# Patient Record
Sex: Male | Born: 1975 | Hispanic: No | Marital: Married | Smoking: Former smoker
Health system: Southern US, Community
[De-identification: ages and names within clinical notes are randomized; demographics above are authoritative.]

## PROBLEM LIST (undated history)

## (undated) DIAGNOSIS — Z87442 Personal history of urinary calculi: Secondary | ICD-10-CM

## (undated) DIAGNOSIS — N200 Calculus of kidney: Secondary | ICD-10-CM

## (undated) DIAGNOSIS — E785 Hyperlipidemia, unspecified: Secondary | ICD-10-CM

## (undated) DIAGNOSIS — M6282 Rhabdomyolysis: Secondary | ICD-10-CM

## (undated) DIAGNOSIS — F329 Major depressive disorder, single episode, unspecified: Secondary | ICD-10-CM

## (undated) DIAGNOSIS — G43909 Migraine, unspecified, not intractable, without status migrainosus: Secondary | ICD-10-CM

## (undated) DIAGNOSIS — K8020 Calculus of gallbladder without cholecystitis without obstruction: Secondary | ICD-10-CM

## (undated) DIAGNOSIS — N259 Disorder resulting from impaired renal tubular function, unspecified: Secondary | ICD-10-CM

## (undated) DIAGNOSIS — G4700 Insomnia, unspecified: Secondary | ICD-10-CM

## (undated) DIAGNOSIS — R7302 Impaired glucose tolerance (oral): Secondary | ICD-10-CM

## (undated) DIAGNOSIS — R7989 Other specified abnormal findings of blood chemistry: Secondary | ICD-10-CM

## (undated) DIAGNOSIS — I219 Acute myocardial infarction, unspecified: Secondary | ICD-10-CM

## (undated) DIAGNOSIS — J9690 Respiratory failure, unspecified, unspecified whether with hypoxia or hypercapnia: Secondary | ICD-10-CM

## (undated) DIAGNOSIS — I472 Ventricular tachycardia, unspecified: Secondary | ICD-10-CM

## (undated) DIAGNOSIS — I10 Essential (primary) hypertension: Secondary | ICD-10-CM

## (undated) DIAGNOSIS — F419 Anxiety disorder, unspecified: Secondary | ICD-10-CM

## (undated) DIAGNOSIS — Z8739 Personal history of other diseases of the musculoskeletal system and connective tissue: Secondary | ICD-10-CM

## (undated) DIAGNOSIS — I5022 Chronic systolic (congestive) heart failure: Secondary | ICD-10-CM

## (undated) DIAGNOSIS — Z95810 Presence of automatic (implantable) cardiac defibrillator: Secondary | ICD-10-CM

## (undated) DIAGNOSIS — I255 Ischemic cardiomyopathy: Secondary | ICD-10-CM

## (undated) DIAGNOSIS — I2510 Atherosclerotic heart disease of native coronary artery without angina pectoris: Secondary | ICD-10-CM

## (undated) DIAGNOSIS — I509 Heart failure, unspecified: Secondary | ICD-10-CM

## (undated) DIAGNOSIS — J309 Allergic rhinitis, unspecified: Secondary | ICD-10-CM

## (undated) HISTORY — DX: Hyperlipidemia, unspecified: E78.5

## (undated) HISTORY — DX: Other specified abnormal findings of blood chemistry: R79.89

## (undated) HISTORY — DX: Rhabdomyolysis: M62.82

## (undated) HISTORY — DX: Calculus of gallbladder without cholecystitis without obstruction: K80.20

## (undated) HISTORY — DX: Presence of automatic (implantable) cardiac defibrillator: Z95.810

## (undated) HISTORY — DX: Chronic systolic (congestive) heart failure: I50.22

## (undated) HISTORY — DX: Ventricular tachycardia, unspecified: I47.20

## (undated) HISTORY — DX: Impaired glucose tolerance (oral): R73.02

## (undated) HISTORY — DX: Atherosclerotic heart disease of native coronary artery without angina pectoris: I25.10

## (undated) HISTORY — DX: Acute myocardial infarction, unspecified: I21.9

## (undated) HISTORY — DX: Allergic rhinitis, unspecified: J30.9

## (undated) HISTORY — DX: Disorder resulting from impaired renal tubular function, unspecified: N25.9

## (undated) HISTORY — DX: Anxiety disorder, unspecified: F41.9

## (undated) HISTORY — DX: Respiratory failure, unspecified, unspecified whether with hypoxia or hypercapnia: J96.90

## (undated) HISTORY — DX: Migraine, unspecified, not intractable, without status migrainosus: G43.909

## (undated) HISTORY — DX: Insomnia, unspecified: G47.00

## (undated) HISTORY — DX: Calculus of kidney: N20.0

## (undated) HISTORY — DX: Major depressive disorder, single episode, unspecified: F32.9

## (undated) HISTORY — DX: Ventricular tachycardia: I47.2

---

## 1997-11-04 ENCOUNTER — Emergency Department (HOSPITAL_COMMUNITY): Admission: EM | Admit: 1997-11-04 | Discharge: 1997-11-04 | Payer: Self-pay | Admitting: Emergency Medicine

## 2005-03-04 ENCOUNTER — Ambulatory Visit (HOSPITAL_COMMUNITY)
Admission: RE | Admit: 2005-03-04 | Discharge: 2005-03-04 | Payer: Self-pay | Admitting: Physical Medicine and Rehabilitation

## 2006-09-19 ENCOUNTER — Ambulatory Visit: Payer: Self-pay | Admitting: Internal Medicine

## 2006-09-19 LAB — CONVERTED CEMR LAB
AST: 25 units/L (ref 0–37)
Albumin: 4.1 g/dL (ref 3.5–5.2)
Alkaline Phosphatase: 79 units/L (ref 39–117)
BUN: 14 mg/dL (ref 6–23)
Basophils Absolute: 0 10*3/uL (ref 0.0–0.1)
Bilirubin Urine: NEGATIVE
Chloride: 104 meq/L (ref 96–112)
Cholesterol: 224 mg/dL (ref 0–200)
Direct LDL: 153.2 mg/dL
Eosinophils Absolute: 0.1 10*3/uL (ref 0.0–0.6)
GFR calc non Af Amer: 121 mL/min
HCT: 44 % (ref 39.0–52.0)
HDL: 32.9 mg/dL — ABNORMAL LOW (ref >39.0)
Hemoglobin, Urine: NEGATIVE
Ketones, ur: NEGATIVE mg/dL
MCHC: 33.9 g/dL (ref 30.0–36.0)
MCV: 79.3 fL (ref 78.0–100.0)
Monocytes Relative: 8.7 % (ref 3.0–11.0)
Neutrophils Relative %: 63 % (ref 43.0–77.0)
Potassium: 4.2 meq/L (ref 3.5–5.1)
RBC: 5.55 M/uL (ref 4.22–5.81)
Sodium: 139 meq/L (ref 135–145)
Testosterone: 292.57 ng/dL — ABNORMAL LOW (ref 350.00–890)
Total CHOL/HDL Ratio: 6.8
Triglycerides: 244 mg/dL (ref 0–149)
Urine Glucose: NEGATIVE mg/dL
Urobilinogen, UA: 0.2 (ref 0.0–1.0)
VLDL: 49 mg/dL — ABNORMAL HIGH (ref 0–40)
pH: 6 (ref 5.0–8.0)

## 2006-09-20 DIAGNOSIS — J45909 Unspecified asthma, uncomplicated: Secondary | ICD-10-CM | POA: Insufficient documentation

## 2006-09-20 DIAGNOSIS — Z87442 Personal history of urinary calculi: Secondary | ICD-10-CM

## 2007-03-23 ENCOUNTER — Encounter: Payer: Self-pay | Admitting: Internal Medicine

## 2007-06-22 ENCOUNTER — Ambulatory Visit: Payer: Self-pay | Admitting: Internal Medicine

## 2007-06-22 DIAGNOSIS — E785 Hyperlipidemia, unspecified: Secondary | ICD-10-CM | POA: Insufficient documentation

## 2007-06-22 DIAGNOSIS — G479 Sleep disorder, unspecified: Secondary | ICD-10-CM

## 2007-06-22 DIAGNOSIS — G4700 Insomnia, unspecified: Secondary | ICD-10-CM

## 2007-06-22 DIAGNOSIS — F32A Depression, unspecified: Secondary | ICD-10-CM | POA: Insufficient documentation

## 2007-06-22 DIAGNOSIS — K8020 Calculus of gallbladder without cholecystitis without obstruction: Secondary | ICD-10-CM | POA: Insufficient documentation

## 2007-06-22 DIAGNOSIS — F329 Major depressive disorder, single episode, unspecified: Secondary | ICD-10-CM | POA: Insufficient documentation

## 2007-06-22 DIAGNOSIS — F3289 Other specified depressive episodes: Secondary | ICD-10-CM

## 2007-06-22 HISTORY — DX: Major depressive disorder, single episode, unspecified: F32.9

## 2007-06-22 HISTORY — DX: Calculus of gallbladder without cholecystitis without obstruction: K80.20

## 2007-06-22 HISTORY — DX: Insomnia, unspecified: G47.00

## 2007-06-22 HISTORY — DX: Other specified depressive episodes: F32.89

## 2007-08-07 ENCOUNTER — Telehealth (INDEPENDENT_AMBULATORY_CARE_PROVIDER_SITE_OTHER): Payer: Self-pay | Admitting: *Deleted

## 2007-12-28 ENCOUNTER — Telehealth (INDEPENDENT_AMBULATORY_CARE_PROVIDER_SITE_OTHER): Payer: Self-pay | Admitting: *Deleted

## 2008-12-19 ENCOUNTER — Ambulatory Visit: Payer: Self-pay | Admitting: Internal Medicine

## 2008-12-19 DIAGNOSIS — R030 Elevated blood-pressure reading, without diagnosis of hypertension: Secondary | ICD-10-CM | POA: Insufficient documentation

## 2009-02-14 ENCOUNTER — Ambulatory Visit: Payer: Self-pay | Admitting: Internal Medicine

## 2009-02-14 DIAGNOSIS — M542 Cervicalgia: Secondary | ICD-10-CM | POA: Insufficient documentation

## 2009-02-14 DIAGNOSIS — J069 Acute upper respiratory infection, unspecified: Secondary | ICD-10-CM | POA: Insufficient documentation

## 2009-02-18 HISTORY — PX: CORONARY ANGIOPLASTY WITH STENT PLACEMENT: SHX49

## 2009-06-27 ENCOUNTER — Encounter: Payer: Self-pay | Admitting: Internal Medicine

## 2009-08-18 ENCOUNTER — Inpatient Hospital Stay (HOSPITAL_COMMUNITY): Admission: EM | Admit: 2009-08-18 | Discharge: 2009-08-29 | Payer: Self-pay | Admitting: Emergency Medicine

## 2009-08-18 ENCOUNTER — Encounter (INDEPENDENT_AMBULATORY_CARE_PROVIDER_SITE_OTHER): Payer: Self-pay | Admitting: Cardiology

## 2009-08-18 ENCOUNTER — Ambulatory Visit: Payer: Self-pay | Admitting: Pulmonary Disease

## 2009-08-18 DIAGNOSIS — J9690 Respiratory failure, unspecified, unspecified whether with hypoxia or hypercapnia: Secondary | ICD-10-CM

## 2009-08-18 DIAGNOSIS — I219 Acute myocardial infarction, unspecified: Secondary | ICD-10-CM

## 2009-08-18 HISTORY — DX: Respiratory failure, unspecified, unspecified whether with hypoxia or hypercapnia: J96.90

## 2009-08-18 HISTORY — DX: Acute myocardial infarction, unspecified: I21.9

## 2009-08-24 ENCOUNTER — Encounter: Payer: Self-pay | Admitting: Cardiovascular Disease

## 2009-08-28 ENCOUNTER — Ambulatory Visit: Payer: Self-pay | Admitting: Infectious Diseases

## 2009-09-04 ENCOUNTER — Encounter: Admission: RE | Admit: 2009-09-04 | Discharge: 2009-09-04 | Payer: Self-pay | Admitting: Cardiology

## 2009-09-13 ENCOUNTER — Ambulatory Visit: Payer: Self-pay | Admitting: Pulmonary Disease

## 2009-09-13 DIAGNOSIS — J159 Unspecified bacterial pneumonia: Secondary | ICD-10-CM | POA: Insufficient documentation

## 2009-09-18 ENCOUNTER — Ambulatory Visit: Payer: Self-pay | Admitting: Cardiovascular Disease

## 2009-09-18 DIAGNOSIS — M6282 Rhabdomyolysis: Secondary | ICD-10-CM

## 2009-09-18 DIAGNOSIS — I5022 Chronic systolic (congestive) heart failure: Secondary | ICD-10-CM

## 2009-09-18 DIAGNOSIS — G43909 Migraine, unspecified, not intractable, without status migrainosus: Secondary | ICD-10-CM

## 2009-09-18 DIAGNOSIS — N259 Disorder resulting from impaired renal tubular function, unspecified: Secondary | ICD-10-CM

## 2009-09-18 DIAGNOSIS — J9600 Acute respiratory failure, unspecified whether with hypoxia or hypercapnia: Secondary | ICD-10-CM

## 2009-09-18 DIAGNOSIS — E7800 Pure hypercholesterolemia, unspecified: Secondary | ICD-10-CM | POA: Insufficient documentation

## 2009-09-18 DIAGNOSIS — I219 Acute myocardial infarction, unspecified: Secondary | ICD-10-CM | POA: Insufficient documentation

## 2009-09-18 HISTORY — DX: Rhabdomyolysis: M62.82

## 2009-09-18 HISTORY — DX: Disorder resulting from impaired renal tubular function, unspecified: N25.9

## 2009-09-18 HISTORY — DX: Chronic systolic (congestive) heart failure: I50.22

## 2009-09-18 HISTORY — DX: Migraine, unspecified, not intractable, without status migrainosus: G43.909

## 2009-09-18 LAB — CONVERTED CEMR LAB
GFR calc non Af Amer: 91.14 mL/min (ref >60)
Potassium: 4.4 meq/L (ref 3.5–5.1)
Pro B Natriuretic peptide (BNP): 519.9 pg/mL — ABNORMAL HIGH (ref 0.0–100.0)
Sodium: 138 meq/L (ref 135–145)

## 2009-09-21 ENCOUNTER — Encounter (INDEPENDENT_AMBULATORY_CARE_PROVIDER_SITE_OTHER): Payer: Self-pay | Admitting: *Deleted

## 2009-10-11 ENCOUNTER — Encounter: Payer: Self-pay | Admitting: Cardiovascular Disease

## 2009-10-18 ENCOUNTER — Ambulatory Visit: Payer: Self-pay | Admitting: Cardiovascular Disease

## 2009-10-18 ENCOUNTER — Ambulatory Visit (HOSPITAL_COMMUNITY): Admission: RE | Admit: 2009-10-18 | Discharge: 2009-10-18 | Payer: Self-pay | Admitting: Cardiovascular Disease

## 2009-10-20 ENCOUNTER — Ambulatory Visit: Payer: Self-pay | Admitting: Internal Medicine

## 2009-10-20 DIAGNOSIS — I429 Cardiomyopathy, unspecified: Secondary | ICD-10-CM | POA: Insufficient documentation

## 2009-10-20 DIAGNOSIS — I2589 Other forms of chronic ischemic heart disease: Secondary | ICD-10-CM

## 2009-10-20 DIAGNOSIS — I255 Ischemic cardiomyopathy: Secondary | ICD-10-CM

## 2009-10-20 HISTORY — DX: Ischemic cardiomyopathy: I25.5

## 2009-10-26 ENCOUNTER — Telehealth (INDEPENDENT_AMBULATORY_CARE_PROVIDER_SITE_OTHER): Payer: Self-pay | Admitting: *Deleted

## 2009-10-26 ENCOUNTER — Encounter (INDEPENDENT_AMBULATORY_CARE_PROVIDER_SITE_OTHER): Payer: Self-pay | Admitting: *Deleted

## 2009-10-27 ENCOUNTER — Encounter: Payer: Self-pay | Admitting: Pulmonary Disease

## 2009-10-27 ENCOUNTER — Ambulatory Visit: Payer: Self-pay | Admitting: Cardiovascular Disease

## 2009-10-27 LAB — CONVERTED CEMR LAB
Basophils Relative: 0.6 % (ref 0.0–3.0)
Chloride: 103 meq/L (ref 96–112)
Eosinophils Relative: 2.2 % (ref 0.0–5.0)
HCT: 40.3 % (ref 39.0–52.0)
Hemoglobin: 13.5 g/dL (ref 13.0–17.0)
INR: 1.1 — ABNORMAL HIGH (ref 0.8–1.0)
Lymphs Abs: 2.1 10*3/uL (ref 0.7–4.0)
MCV: 80.8 fL (ref 78.0–100.0)
Monocytes Absolute: 0.8 10*3/uL (ref 0.1–1.0)
Potassium: 4.3 meq/L (ref 3.5–5.1)
RBC: 4.99 M/uL (ref 4.22–5.81)
WBC: 6.8 10*3/uL (ref 4.5–10.5)

## 2009-10-30 ENCOUNTER — Ambulatory Visit: Payer: Self-pay | Admitting: Pulmonary Disease

## 2009-10-31 ENCOUNTER — Ambulatory Visit: Payer: Self-pay | Admitting: Cardiovascular Disease

## 2009-10-31 ENCOUNTER — Inpatient Hospital Stay (HOSPITAL_BASED_OUTPATIENT_CLINIC_OR_DEPARTMENT_OTHER): Admission: RE | Admit: 2009-10-31 | Discharge: 2009-10-31 | Payer: Self-pay | Admitting: Cardiovascular Disease

## 2009-11-02 ENCOUNTER — Encounter: Payer: Self-pay | Admitting: Internal Medicine

## 2009-11-18 HISTORY — PX: ICD IMPLANT: EP1208

## 2009-11-22 ENCOUNTER — Telehealth (INDEPENDENT_AMBULATORY_CARE_PROVIDER_SITE_OTHER): Payer: Self-pay | Admitting: *Deleted

## 2009-11-27 ENCOUNTER — Ambulatory Visit: Payer: Self-pay | Admitting: Internal Medicine

## 2009-11-27 ENCOUNTER — Ambulatory Visit (HOSPITAL_COMMUNITY): Admission: RE | Admit: 2009-11-27 | Discharge: 2009-11-28 | Payer: Self-pay | Admitting: Internal Medicine

## 2009-11-28 ENCOUNTER — Encounter: Payer: Self-pay | Admitting: Internal Medicine

## 2009-11-29 ENCOUNTER — Ambulatory Visit (HOSPITAL_COMMUNITY)
Admission: RE | Admit: 2009-11-29 | Discharge: 2009-11-29 | Payer: Self-pay | Source: Home / Self Care | Admitting: Internal Medicine

## 2009-11-29 ENCOUNTER — Encounter: Payer: Self-pay | Admitting: Internal Medicine

## 2009-11-30 ENCOUNTER — Telehealth: Payer: Self-pay | Admitting: Internal Medicine

## 2009-12-06 ENCOUNTER — Ambulatory Visit: Payer: Self-pay

## 2009-12-06 ENCOUNTER — Encounter: Payer: Self-pay | Admitting: Internal Medicine

## 2009-12-08 ENCOUNTER — Encounter (INDEPENDENT_AMBULATORY_CARE_PROVIDER_SITE_OTHER): Payer: Self-pay | Admitting: *Deleted

## 2010-01-17 ENCOUNTER — Ambulatory Visit: Payer: Self-pay | Admitting: Cardiovascular Disease

## 2010-01-17 DIAGNOSIS — Z95810 Presence of automatic (implantable) cardiac defibrillator: Secondary | ICD-10-CM

## 2010-01-17 HISTORY — DX: Presence of automatic (implantable) cardiac defibrillator: Z95.810

## 2010-02-18 HISTORY — PX: CARDIAC CATHETERIZATION: SHX172

## 2010-03-06 ENCOUNTER — Encounter: Payer: Self-pay | Admitting: Internal Medicine

## 2010-03-06 ENCOUNTER — Ambulatory Visit
Admission: RE | Admit: 2010-03-06 | Discharge: 2010-03-06 | Payer: Self-pay | Source: Home / Self Care | Attending: Internal Medicine | Admitting: Internal Medicine

## 2010-03-20 NOTE — Letter (Signed)
Summary: Laqueta Carina MD  Laqueta Carina MD   Imported By: Lester Ledbetter 07/10/2009 07:58:19  _____________________________________________________________________  External Attachment:    Type:   Image     Comment:   External Document

## 2010-03-20 NOTE — Letter (Signed)
Summary: Cardiac Catheterization Instructions- [facility]  [facility], Main Office  [address]   [place], [place] [address]   Phone: [phone]  Fax: [phone]     10/26/2009 MRN: [id number]  Jason Hogan [address] [place], [place]  [address]  Dear [name],   You are scheduled for a Cardiac Catheterization on TUESDAY 10-31-09 with Dr. Eden Emms  Please arrive to the 1st floor of the Heart and Vascular Center at [facility] at    8:30 am     on the day of your procedure. Please do not arrive before 6:30 a.m. Call the Heart and Vascular Center at [phone] if you are unable to make your appointmnet. The Code to get into the parking garage under the building is 0020. Take the elevators to the 1st floor. You must have someone to drive you home. Someone must be with you for the first 24 hours after you arrive home. Please wear clothes that are easy to get on and off and wear slip-on shoes. Do not eat or drink after midnight except water with your medications that morning. Bring all your medications and current insurance cards with you.  ___ DO NOT take these medications before your procedure: ________________________________________________________________  ___ Make sure you take your aspirin.  ___ You may take ALL of your medications with water that morning. ________________________________________________________________________________________________________________________________  ___ DO NOT take ANY medications before your procedure.  ___ Pre-med instructions:  ________________________________________________________________________________________________________________________________  The usual length of stay after your procedure is 2 to 3 hours. This can vary.  If you have any questions, please call the office at the number listed above.   Deliah Goody, RN

## 2010-03-20 NOTE — Progress Notes (Signed)
  Phone Note Other Incoming   Caller: dr Charlton Haws Summary of Call: pt needing a left heart cath due to recurrent chest pain and decreased EF%. scheduled for 10-31-09 @ 9:30am. pt aware, will have labs and cxr 10-27-09 Deliah Goody, RN  October 26, 2009 11:58 AM

## 2010-03-20 NOTE — Assessment & Plan Note (Signed)
Summary: add on @ 12:15 per dr Eden Emms spoke with dr Graciela Husbands on 10-19-09   Referring Provider:  Dr. Eden Emms Primary Provider:  Corwin Levins MD  CC:  add-on per Dr. Eden Emms.   Pt states that diuretic isnt draining as much fluid off he has noticed.  He has been lethargic about the last week or so.  He has noticed low BP as well.Jason Hogan  History of Present Illness: Jason Hogan is a 35 year old gentleman  recently hospitalized from 7/1-7/12 for acute ST elevation anterior MI Rx by Dr Sharyn Lull.  Previous silent MI with distal RCA occlusion that was collaterilized.  DES to proximal LAD with EF 50-55%.  Post MI course complicated by pulmonary edema, ? aspiration requiring 4 day intubation with cultures growing staph aureas.    . Following discharge he has had improvement in his shortness of breath but underwent  MRI yesterday demonstrating ejection fraction 35% with a large anteroapical and septal scar area and there is also significant hypokinesis.   the patient has some fatigue and mild exercise intolerance.  Current Medications (verified): 1)  Nasonex 50 Mcg/act  Susp (Mometasone Furoate) .... 2 Spray/side Once Daily As Needed 2)  Symbicort 80-4.5 Mcg/act  Aero (Budesonide-Formoterol Fumarate) .... 2puffs Two Times A Day As Needed 3)  Lasix 40 Mg Tabs (Furosemide) .... Take One Tablet Daily Up To 2 Tablets As Needed 4)  Crestor 10 Mg Tabs (Rosuvastatin Calcium) .... Take 1 Tab By Mouth At Bedtime 5)  Aspirin 325 Mg  Tabs (Aspirin) .... Take 1 Tablet By Mouth Every Morning 6)  Effient 10 Mg Tabs (Prasugrel Hcl) .... Take 1 Tablet By Mouth Once A Day 7)  Ramipril 2.5 Mg Caps (Ramipril) .... Take 1 Tablet By Mouth Once A Day 8)  Metoprolol Succinate 100 Mg Xr24h-Tab (Metoprolol Succinate) .... Take 1 Tablet By Mouth Once A Day 9)  Klor-Con M20 20 Meq Cr-Tabs (Potassium Chloride Crys Cr) .... Take One Tablet Once Daily Up To Twice As Needed  Allergies (verified): No Known Drug Allergies  Past History:  Past  Medical History: Last updated: 09/18/2009 MYOCARDIAL INFARCTION CAD  HYPERLIPIDEMIA  RENAL INSUFFICIENCY RHABDOMYOLYSIS HYPERCHOLESTEROLEMIA  UNSPECIFIED HEART FAILURE  RESPIRATORY FAILURE  MIGRAINE HEADACHE BACTERIAL PNEUMONIA NECK PAIN, RIGHT  URI  ELEVATED BLOOD PRESSURE WITHOUT DIAGNOSIS OF HYPERTENSION  INSOMNIA-SLEEP DISORDER-UNSPEC  DEPRESSION CHOLELITHIASIS RENAL CALCULUS, HX OF  ASTHMA   Acute anterolateral wall myocardial infarction.   Bilateral staph aureus pneumonia.   Status post acute respiratory failure secondary to congestive heart failure, secondary to aspiration.   Past Surgical History: Last updated: 09/20/2006 Denies surgical history  Family History: Last updated: 06/22/2007 heart disease DM hypothyroid  Social History: Last updated: 09/13/2009 work - [employer] Former Smoker Alcohol use-yes Married no children Patient states former smoker.   Review of Systems       full review of systems was negative apart from a history of present illness and past medical history.   Vital Signs:  Patient profile:   35 year old male Height:      73 inches Weight:      216 pounds BMI:     28.60 Pulse rate:   76 / minute Pulse rhythm:   regular BP sitting:   108 / 66  (left arm) Cuff size:   large  Vitals Entered By: Judithe Modest CMA (October 20, 2009 12:12 PM)  Physical Exam  General:  Well developed, well nourished young -Indian-Asian male appearing his stated age, in no  acute distress. Head:  normal HEENT Neck:  supple without thyromegaly carotids brisk and full no JVD Chest Wall:  without kyphosis or CVA tenderness Lungs:  clear to auscultation Heart:  without murmurs or gallops regular rate and rhythm nondisplaced PMI Abdomen:  soft nontender without hepatomegaly active bowel sounds Msk:  Back normal, normal gait. Muscle strength and tone normal. Pulses:  intact distal pulses Extremities:  no clubbing cyanosis or edema Neurologic:   alert and oriented Skin:  warm and dry Psych:  engaging affect   EKG  Procedure date:  10/20/2009  Findings:      sinus rhythm at 72 Intervals 0.15 5.11/0.42 Axis is 85  anterior MI with Q waves from V1 to V6  inferior MI  Impression & Recommendations:  Problem # 1:  CARDIOMYOPATHY, ISCHEMIC (ICD-414.8) the patient has a severe and persistent cardiomyopathy. MRI demonstrates severe scarring and ejection fraction is sufficiently low that ICD implantation is a reasonable consideration for the reduction of risk of sudden cardiac death. I have reviewed this extensively with the family. We have reviewed the potential benefits as well as the risks including but not limited to death perforation infection lead dislodgment and inappropriate ICD therapy. We discussed also the possibility of enrollment in the MADIT R. IT trial.   Like to proceed in October.  The other issue is what is the mechanism by which his left ventricular dysfunction worsened following revascularization. Affect is persistent enzyme elevation if they want whether there might have been subacute occlusion of his stenting site. Could there be another overlying mechanism of cardiac myopathy i.e. related to sepsis or possibly a rapid adverse remodeling. I will discuss these issues with Dr. Eden Emms The following medications were removed from the medication list:    Aldactone 25 Mg Tabs (Spironolactone) .Jason Hogan... Take 1 tablet by mouth once a day His updated medication list for this problem includes:    Lasix 40 Mg Tabs (Furosemide) .Jason Hogan... Take one tablet daily up to 2 tablets as needed    Aspirin 325 Mg Tabs (Aspirin) .Jason Hogan... Take 1 tablet by mouth every morning    Effient 10 Mg Tabs (Prasugrel hcl) .Jason Hogan... Take 1 tablet by mouth once a day    Ramipril 2.5 Mg Caps (Ramipril) .Jason Hogan... Take 1 tablet by mouth once a day    Metoprolol Succinate 100 Mg Xr24h-tab (Metoprolol succinate) .Jason Hogan... Take 1 tablet by mouth once a day  Problem # 2:   HYPERLIPIDEMIA (ICD-272.4) he'll be very important for him to work hard to get his cholesterol down to range. I wonder whether an endocrine referral at a major Medical Center might be a benefit His updated medication list for this problem includes:    Crestor 10 Mg Tabs (Rosuvastatin calcium) .Jason Hogan... Take 1 tab by mouth at bedtime

## 2010-03-20 NOTE — Miscellaneous (Signed)
Summary: [facility] Cardiac Progress Note   [facility] Cardiac Progress Note   Imported By: Roderic Ovens 10/30/2009 13:15:21  _____________________________________________________________________  External Attachment:    Type:   Image     Comment:   External Document

## 2010-03-20 NOTE — Letter (Signed)
Summary: Generic Letter  [facility]     [place], [place]    Phone:   Fax:         December 08, 2009 MRN: [id number]    Jason Hogan [address] [place], [place]  [address]    Dear Jason Hogan,   Dr. Eden Emms has schedule you to see Dr. Arminda Resides with [facility], on 01-30-10 at 3:00pm.  They are located at [address] [place], [place].  If you need to reschedule your appointment, please call [phone].   Sincerely,  Omar Person  This letter has been electronically signed by your physician.

## 2010-03-20 NOTE — Miscellaneous (Signed)
Summary: Orders Update  Clinical Lists Changes  Orders: Added new Test order of T-2 View CXR (71020TC) - Signed 

## 2010-03-20 NOTE — Letter (Signed)
Summary: Appointment - Cardiac MRI  [facility], Main Office  [address]   [place], [place] [address]   Phone: [phone]  Fax: [phone]      September 21, 2009 MRN: [id number]   DELMUS WARWICK [address] [place], [place]  [address]   Dear Mr. ROULSTON,   We have scheduled the above patient for an appointment for a Cardiac MRI on  10/18/2009 at  2:00p.m .  Please refer to the below information for the location and instructions for this test:  Location:     [facility]       [address]       [place], [place]  [address] Instructions:    Wilmon Arms at [facility] Outpatient Registration 45 minutes prior to your appointment time.  This will ensure you are in the Radiology Department 30 minutes prior to your appointment.    There are no restrictions for this test you may eat and take medications as usual.  If you need to reschedule this appointment please call at the number listed above.  Sincerely,     Lorne Skeens  [facility] Scheduling Team

## 2010-03-20 NOTE — Letter (Signed)
Summary: Implantable Device Instructions  [facility], Main Office  [address]   [place], [place] [address]   Phone: [phone]  Fax: [phone]      Implantable Device Instructions  You are scheduled for:  _____ Permanent Transvenous Pacemaker __X___ Implantable Cardioverter Defibrillator _____ Implantable Loop Recorder _____ Generator Change  on __10/10/11___ with Dr. KLEIN_____.  1.  Please arrive at the Short Stay Center at [facility] at __6:00 AM___ on the day of your procedure.  2.  Do not eat or drink the night before your procedure.  3.  Complete lab work on _10/3/11 ____.  The lab at [facility] is open from 8:30 AM to 1:30 PM and from 2:30 PM to 5:00 PM.  The lab at [facility] is open from 7:30 AM to 5:30 PM.  You do not have to be fasting.  4.  Do NOT take these medications for _HOLD FUROSEMIDE  AM OF PROCEDURE. 5.  Plan for an overnight stay.  Bring your insurance cards and a list of your medications.  6.  Wash your chest and neck with antibacterial soap (any brand) the evening before and the morning of your procedure.  Rinse well.  7.  Education material received:     Pacemaker _____           ICD __X___           Arrhythmia _____  *If you have ANY questions after you get home, please call the office [phone].  *Every attempt is made to prevent procedures from being rescheduled.  Due to the nauture of Electrophysiology, rescheduling can happen.  The physician is always aware and directs the staff when this occurs.

## 2010-03-20 NOTE — Assessment & Plan Note (Signed)
Summary: NP3/DM   Referring Provider:  Dr. Eden Emms Primary Provider:  Corwin Levins MD   History of Present Illness: 35 yo friend of mine recently hospitalized from 7/1-7/12 for acute ST elevation anterior MI Rx by Dr Sharyn Lull.  Previous silent MI with distal RCA occlusion that was collaterilized.  DES to proximal LAD with EF 50-55%.  Post MI course complicated by pulmonary edema, ? aspiration requiring 4 day intubation with cultures growing staph aureas.  Since D/C SOB slowly improving with less "hemoptysis". F/U with Dr Vassie Loll last week and no need for further antibiotics or steroids.  Did not tolerate aldactone and prefers Lasix.  No SSCP, improving dyspnea, no palpitations although HR still on high side.  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    Headaches have returned.  Indicated needing RL molar removal.  Told him he could not stop his Effient for at least 6 months.  Etiology of his headaches has never been clear.    Current Medications (verified): 1)  Nasonex 50 Mcg/act  Susp (Mometasone Furoate) .... 2 Spray/side Once Daily As Needed 2)  Symbicort 80-4.5 Mcg/act  Aero (Budesonide-Formoterol Fumarate) .... 2puffs Two Times A Day As Needed 3)  Lasix 40 Mg Tabs (Furosemide) .... Take 1 Tablet By Mouth Two Times A Day 4)  Aldactone 25 Mg Tabs (Spironolactone) .... Take 1 Tablet By Mouth Once A Day 5)  Crestor 10 Mg Tabs (Rosuvastatin Calcium) .... Take 1 Tab By Mouth At Bedtime 6)  Aspirin 325 Mg  Tabs (Aspirin) .... Take 1 Tablet By Mouth Every Morning 7)  Effient 10 Mg Tabs (Prasugrel Hcl) .... Take 1 Tablet By Mouth Once A Day 8)  Ramipril 2.5 Mg Caps (Ramipril) .... Take 1 Tablet By Mouth Once A Day 9)  Metoprolol Succinate 100 Mg Xr24h-Tab (Metoprolol Succinate) .... Take 1 Tablet By Mouth Once A Day 10)  Klor-Con M20 20 Meq Cr-Tabs (Potassium Chloride Crys Cr) .... Take 1 Tablet By Mouth Two Times A Day With Lasix 11)  Skelaxin 800 Mg Tabs  (Metaxalone) .... Take 1 Tablet By Mouth Three Times A Day As Needed  Allergies (verified): No Known Drug Allergies  Past History:  Past Medical History: Last updated: 09/18/2009 MYOCARDIAL INFARCTION CAD  HYPERLIPIDEMIA  RENAL INSUFFICIENCY RHABDOMYOLYSIS HYPERCHOLESTEROLEMIA  UNSPECIFIED HEART FAILURE  RESPIRATORY FAILURE  MIGRAINE HEADACHE BACTERIAL PNEUMONIA NECK PAIN, RIGHT  URI  ELEVATED BLOOD PRESSURE WITHOUT DIAGNOSIS OF HYPERTENSION  INSOMNIA-SLEEP DISORDER-UNSPEC  DEPRESSION CHOLELITHIASIS RENAL CALCULUS, HX OF  ASTHMA   Acute anterolateral wall myocardial infarction.   Bilateral staph aureus pneumonia.   Status post acute respiratory failure secondary to congestive heart failure, secondary to aspiration.   Past Surgical History: Last updated: 09/20/2006 Denies surgical history  Family History: Last updated: 06/22/2007 heart disease DM hypothyroid  Social History: Last updated: 09/13/2009 work - [employer] Former Smoker Alcohol use-yes Married no children Patient states former smoker.   Review of Systems       Denies fever, malais, weight loss, blurry vision, decreased visual acuity, cough, sputum, SOB, hemoptysis, pleuritic pain, palpitaitons, heartburn, abdominal pain, melena, lower extremity edema, claudication, or rash.   Vital Signs:  Patient profile:   35 year old male Pulse rate:   88 / minute Resp:     14 per minute BP supine:   105 / 65  Physical Exam  General:  Affect appropriate Healthy:  appears stated age HEENT: normal Neck supple with no adenopathy JVP normal no bruits  no thyromegaly Lungs clear with no wheezing and good diaphragmatic motion Heart:  S1/S2 no murmur,rub, gallop or click PMI normal Abdomen: benighn, BS positve, no tenderness, no AAA no bruit.  No HSM or HJR Distal pulses intact with no bruits No edema Neuro non-focal Skin warm and dry    Impression & Recommendations:  Problem # 1:  MYOCARDIAL  INFARCTION (ICD-410.90) Anterior MI 7/1 with DES to proximal LAD.  Silent distal RCA occlusion with collaterals.  F/U MRI 4-6 weeks to assess LVH/mass EF and scar tissue.   His updated medication list for this problem includes:    Aspirin 325 Mg Tabs (Aspirin) .[name]... Take 1 tablet by mouth every morning    Effient 10 Mg Tabs (Prasugrel hcl) .[name]... Take 1 tablet by mouth once a day    Ramipril 2.5 Mg Caps (Ramipril) .[name]... Take 1 tablet by mouth once a day    Metoprolol Succinate 100 Mg Xr24h-tab (Metoprolol succinate) .[name]... Take 1 tablet by mouth once a day  Orders: Cardiac MRI (Cardiac MRI)  Problem # 2:  HYPERCHOLESTEROLEMIA (ICD-272.0) F/U labs in 2-3 months on statin His updated medication list for this problem includes:    Crestor 10 Mg Tabs (Rosuvastatin calcium) .[name]... Take 1 tab by mouth at bedtime  CHOL: 224 (09/19/2006)   LDL: DEL (09/19/2006)   HDL: 32.9 (09/19/2006)   TG: 244 (09/19/2006)  Problem # 3:  RESPIRATORY FAILURE (ICD-518.81) Dyspnea improving.  Review of Xrays shows mild infiltrate persists in RLL only. ? post capillary fracture syndrome History of asthma with inhalers.  Imrproviing.  No roids or antibiotics  Check LV/RV funtion with MRI  Problem # 4:  RENAL INSUFFICIENCY (ICD-588.9) Will check BMET and decrease lasix to once a day if BNP under 100.  Stop aldactone Orders: TLB-BMP (Basic Metabolic Panel-BMET) (80048-METABOL)  Other Orders: TLB-BNP (B-Natriuretic Peptide) (83880-BNPR)  Patient Instructions: 1)  Your physician recommends that you schedule a follow-up appointment in: 3 MONTHS 2)  Your physician has requested that you have a cardiac MRI.  Cardiac MRI uses a computer to create images of your heart as it's beating, producing both still and moving pictures of your heart and major blood vessels. For further information please visit  [email or web address]  Please follow the instruction sheet given to you today for more information.

## 2010-03-20 NOTE — Miscellaneous (Signed)
Summary: Device preload  Clinical Lists Changes  Observations: Added new observation of ICD INDICATN: ICM (11/29/2009 13:21) Added new observation of ICDLEADSTAT1: active (11/29/2009 13:21) Added new observation of ICDLEADSER1: [id number] (11/29/2009 13:21) Added new observation of ICDLEADMOD1: 7121  (11/29/2009 13:21) Added new observation of ICDLEADLOC1: RV  (11/29/2009 13:21) Added new observation of ICD IMP MD: Sherryl Manges, MD  (11/29/2009 13:21) Added new observation of ICDLEADDOI1: 11/27/2009  (11/29/2009 13:21) Added new observation of ICD IMPL DTE: 11/27/2009  (11/29/2009 13:21) Added new observation of ICD SERL#: [id number]  (11/29/2009 13:21) Added new observation of ICD MODL#: [id number]  (11/29/2009 29:56) Added new observation of ICDMANUFACTR: [employer]  (11/29/2009 13:21) Added new observation of ICD MD: Sherryl Manges, MD  (11/29/2009 13:21)       ICD Specifications Following MD:  Sherryl Manges, MD     ICD Vendor:  [employer] Jude     ICD Model Number:  [id number]     ICD Serial Number:  [id number] ICD DOI:  11/27/2009     ICD Implanting MD:  Sherryl Manges, MD  Lead 1:    Location: RV     DOI: 11/27/2009     Model #: [id number]     Serial #: [id number]     Status: active  Indications::  ICM

## 2010-03-20 NOTE — Progress Notes (Signed)
----   Converted from flag ---- ---- 11/17/2009 12:07 PM, Colon Branch, MD, [facility] wrote: Jason Hogan is having some skin issues.  Not clear to me whats causing it.  Will D/C Toprol.  Call in coreg 12.5 two times a day for him.  See if you can get him an appt with Karlyn Agee in dermatology as well.  Thanks Nish. ------------------------------       Additional Follow-up for Phone Call Additional follow up Details #2::    see other phone note Deliah Goody, RN  November 22, 2009 8:17 AM\par

## 2010-03-20 NOTE — Cardiovascular Report (Signed)
Summary: Office Visit   Office Visit   Imported By: Roderic Ovens 12/18/2009 13:00:02  _____________________________________________________________________  External Attachment:    Type:   Image     Comment:   External Document

## 2010-03-20 NOTE — Cardiovascular Report (Signed)
Summary: Implantable Device Registration   Implantable Device Registration   Imported By: Roderic Ovens 12/18/2009 13:01:11  _____________________________________________________________________  External Attachment:    Type:   Image     Comment:   External Document

## 2010-03-20 NOTE — Assessment & Plan Note (Signed)
Summary: 10 days post hosp/apc   Visit Type:  Hospital Follow-up Copy to:  Dr. Eden Emms Primary Provider/Referring Provider:  Corwin Levins MD  CC:  Pt here for pulmonary consult.  History of Present Illness: 33/M adm 7/1-7/12 for acute MI requiring emergent LAD stent & resp failure requiring mechanical ventilation for MSSA pna ? aspiration. CXR 7/18 improved since 7/1 but rt infiltrates persist He had 1 episode of blood tinged sputum after dc, now on lasix & aldactone, echo showed EF 50-55% & WMA , but overall has continued to improve. he has started mild exercise again, cough has resolved, no fevers. He repors mild intermittent asthma as a child, requiring symbicort on one occasion('allergy season') but never had an exacerbation, his labuterol inhalers would always expire.  Preventive Screening-Counseling & Management  Alcohol-Tobacco     Alcohol drinks/day: <1     Smoking Status: quit     Packs/Day: <0.25     Year Started: 1998     Year Quit: 1998  Current Medications (verified): 1)  Nasonex 50 Mcg/act  Susp (Mometasone Furoate) .... 2 Spray/side Once Daily As Needed 2)  Symbicort 80-4.5 Mcg/act  Aero (Budesonide-Formoterol Fumarate) .... 2puffs Two Times A Day As Needed 3)  Lasix 40 Mg Tabs (Furosemide) .... Take 1 Tablet By Mouth Two Times A Day 4)  Aldactone 25 Mg Tabs (Spironolactone) .... Take 1 Tablet By Mouth Once A Day 5)  Crestor 10 Mg Tabs (Rosuvastatin Calcium) .... Take 1 Tab By Mouth At Bedtime 6)  Aspirin 325 Mg  Tabs (Aspirin) .... Take 1 Tablet By Mouth Every Morning 7)  Effient 10 Mg Tabs (Prasugrel Hcl) .... Take 1 Tablet By Mouth Once A Day 8)  Ramipril 2.5 Mg Caps (Ramipril) .... Take 1 Tablet By Mouth Once A Day 9)  Metoprolol Succinate 100 Mg Xr24h-Tab (Metoprolol Succinate) .... Take 1 Tablet By Mouth Once A Day 10)  Klor-Con M20 20 Meq Cr-Tabs (Potassium Chloride Crys Cr) .... Take 1 Tablet By Mouth Two Times A Day With Lasix 11)  Skelaxin 800 Mg Tabs  (Metaxalone) .... Take 1 Tablet By Mouth Three Times A Day As Needed  Allergies (verified): No Known Drug Allergies  Past History:  Past Medical History: Last updated: 06/22/2007 Asthma Kidney Stones Cholelithiasis Depression lumbar DDD Hyperlipidemia  Past Surgical History: Last updated: 09/20/2006 Denies surgical history  Social History: work - [employer] Former Smoker Alcohol use-yes Married no children Patient states former smoker.  Packs/Day:  <0.25 Alcohol drinks/day:  <1  Review of Systems       The patient complains of shortness of breath with activity, productive cough, coughing up blood, headaches, and joint stiffness or pain.  The patient denies shortness of breath at rest, non-productive cough, chest pain, irregular heartbeats, acid heartburn, indigestion, loss of appetite, weight change, abdominal pain, difficulty swallowing, sore throat, tooth/dental problems, nasal congestion/difficulty breathing through nose, sneezing, itching, ear ache, anxiety, depression, hand/feet swelling, rash, change in color of mucus, and fever.    Vital Signs:  Patient profile:   35 year old male Height:      73 inches Weight:      215 pounds BMI:     28.47 O2 Sat:      98 % on Room air Temp:     98.2 degrees F oral Pulse rate:   94 / minute BP sitting:   112 / 70  (right arm) Cuff size:   regular  Vitals Entered By: Zackery Barefoot CMA (September 13, 2009  2:43 PM)  O2 Flow:  Room air CC: Pt here for pulmonary consult  Does patient need assistance? Functional Status Social activities Comments Medications reviewed with patient Verified contact number and pharmacy with patient Zackery Barefoot CMA  September 13, 2009 2:43 PM    Physical Exam  Additional Exam:  Gen. Pleasant, well-nourished, in no distress, normal affect ENT - no lesions, no post nasal drip Neck: No JVD, no thyromegaly, no carotid bruits Lungs: no use of accessory muscles, no dullness to percussion, clear without  rales or rhonchi  Cardiovascular: Rhythm regular, heart sounds  normal, no murmurs or gallops, no peripheral edema Abdomen: soft and non-tender, no hepatosplenomegaly, BS normal. Musculoskeletal: No deformities, no cyanosis or clubbing Neuro:  alert, non focal     CXR  Procedure date:  09/13/2009  Findings:      IMPRESSION: Stable asymmetric residual streaky airspace opacities.   No new consolidation or effusion  Impression & Recommendations:  Problem # 1:  BACTERIAL PNEUMONIA (ICD-482.9) MSSA - Slow to resolve radiologically, but clinically much improved. Do not feel the need for more ABx. No indication for steroids. Rpt CXR in 1 month. His intermittent symptoms of dyspnea seem to have improved with lasix The following medications were removed from the medication list:    Cephalexin 500 Mg Caps (Cephalexin) .[name]... 1 by mouth three times a day  Orders: T-2 View CXR (71020TC) Est. Patient Level III (14782)  Problem # 2:  ASTHMA (ICD-493.90) Appears mild intermittent he takes symbicort only during allergy season.  Medications Added to Medication List This Visit: 1)  Nasonex 50 Mcg/act Susp (Mometasone furoate) .... 2 spray/side once daily as needed 2)  Symbicort 80-4.5 Mcg/act Aero (Budesonide-formoterol fumarate) .... 2puffs two times a day as needed 3)  Lasix 40 Mg Tabs (Furosemide) .... Take 1 tablet by mouth two times a day 4)  Lasix  .... Take 1 tablet by mouth two times a day 5)  Aldactone 25 Mg Tabs (Spironolactone) .... Take 1 tablet by mouth once a day 6)  Aldactone  .... Take 1 tablet by mouth once a day 7)  Crestor 10 Mg Tabs (Rosuvastatin calcium) .... Take 1 tab by mouth at bedtime 8)  Crestor  .... Take 1 tab by mouth at bedtime 9)  Aspirin 325 Mg Tabs (Aspirin) .... Take 1 tablet by mouth every morning 10)  Effient 10 Mg Tabs (Prasugrel hcl) .... Take 1 tablet by mouth once a day 11)  Ramipril 2.5 Mg Caps (Ramipril) .... Take 1 tablet by mouth once a day 12)   Metoprolol Succinate 100 Mg Xr24h-tab (Metoprolol succinate) .... Take 1 tablet by mouth once a day 13)  Klor-con M20 20 Meq Cr-tabs (Potassium chloride crys cr) .... Take 1 tablet by mouth two times a day with lasix 14)  Skelaxin 800 Mg Tabs (Metaxalone) .... Take 1 tablet by mouth three times a day as needed  Patient Instructions: 1)  Copy sent to: dr Eden Emms 2)  Please schedule a follow-up appointment in 1 month with chest xray 3)  Your pneumonia is improving

## 2010-03-20 NOTE — Letter (Signed)
Summary: Physician Documentation Sheet  Physician Documentation Sheet   Imported By: Debby Freiberg 09/18/2009 10:26:25  _____________________________________________________________________  External Attachment:    Type:   Image     Comment:   External Document

## 2010-03-20 NOTE — Cardiovascular Report (Signed)
Summary: Pre-Op Orders  Pre-Op Orders   Imported By: Marylou Mccoy 11/17/2009 16:09:36  _____________________________________________________________________  External Attachment:    Type:   Image     Comment:   External Document

## 2010-03-20 NOTE — Letter (Signed)
Summary: [facility]   [place] [facility]   Imported By: Roderic Ovens 12/05/2009 14:41:26  _____________________________________________________________________  External Attachment:    Type:   Image     Comment:   External Document

## 2010-03-20 NOTE — Progress Notes (Signed)
----   Converted from flag ---- ---- 11/17/2009 12:18 PM, Colon Branch, MD, [facility] wrote: I called in coreg 12.5 two times a day and Cozaar 50mg  to [facility] to take the place of Toprol and ramapril.  Please make chages in meds ------------------------------       New/Updated Medications: COZAAR 50 MG TABS (LOSARTAN POTASSIUM) Take one tablet by mouth daily CARVEDILOL 12.5 MG TABS (CARVEDILOL) Take one tablet by mouth twice a day

## 2010-03-20 NOTE — Assessment & Plan Note (Signed)
Summary: 3 MONTH ROV.SL   Referring Provider:  Dr. Eden Emms Primary Provider:  Corwin Levins MD  CC:  dizziness.  History of Present Illness: Jason Hogan is seen todya for F/U of CAD with large Anterior M.I. and collateralized silent distal RCA occlusion. Recent AICD implant with resolving hematoma.  Mild postural symptoms.  Compliant with meds.  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  No SSCP, palpitatons, AICD D/C or edema.  Hair and scalp improved on coreg and no rash with cozaar.  Did not tolerate aldactone post m.i. with excessive fatiigue.    Current Problems (verified): 1)  Pre-operative Cardiovascular Examination  (ICD-V72.81) 2)  Chest Pain Unspecified  (ICD-786.50) 3)  Myocardial Infarction  (ICD-410.90) 4)  Cardiomyopathy, Ischemic  (ICD-414.8) 5)  Hyperlipidemia  (ICD-272.4) 6)  Renal Insufficiency  (ICD-588.9) 7)  Rhabdomyolysis  (ICD-728.88) 8)  Hypercholesterolemia  (ICD-272.0) 9)  Unspecified Heart Failure  (ICD-428.9) 10)  Respiratory Failure  (ICD-518.81) 11)  Migraine Headache  (ICD-346.90) 12)  Bacterial Pneumonia  (ICD-482.9) 13)  Neck Pain, Right  (ICD-723.1) 14)  Uri  (ICD-465.9) 15)  Elevated Blood Pressure Without Diagnosis of Hypertension  (ICD-796.2) 16)  Insomnia-sleep Disorder-unspec  (ICD-780.52) 17)  Depression  (ICD-311) 18)  Cholelithiasis  (ICD-574.20) 19)  Renal Calculus, Hx of  (ICD-V13.01) 20)  Asthma  (ICD-493.90)  Current Medications (verified): 1)  Nasonex 50 Mcg/act  Susp (Mometasone Furoate) .... 2 Spray/side Once Daily As Needed 2)  Symbicort 80-4.5 Mcg/act  Aero (Budesonide-Formoterol Fumarate) .... 2puffs Two Times A Day As Needed 3)  Lasix 40 Mg Tabs (Furosemide) .... Take One Tablet Daily Up To 2 Tablets As Needed 4)  Crestor 10 Mg Tabs (Rosuvastatin Calcium) .... Take 1 Tab By Mouth At Bedtime 5)  Aspirin 81 Mg Tbec (Aspirin) .... Take One Tablet By Mouth Daily 6)  Effient 10 Mg Tabs (Prasugrel Hcl) .... Take 1 Tablet By  Mouth Once A Day 7)  Cozaar 50 Mg Tabs (Losartan Potassium) .... Take One Tablet By Mouth Daily 8)  Carvedilol 12.5 Mg Tabs (Carvedilol) .... Take One Tablet By Mouth Twice A Day 9)  Klor-Con M20 20 Meq Cr-Tabs (Potassium Chloride Crys Cr) .... Take One Tablet Once Daily Up To Twice As Needed  Allergies (verified): No Known Drug Allergies  Past History:  Past Medical History: Last updated: 09/18/2009 MYOCARDIAL INFARCTION CAD  HYPERLIPIDEMIA  RENAL INSUFFICIENCY RHABDOMYOLYSIS HYPERCHOLESTEROLEMIA  UNSPECIFIED HEART FAILURE  RESPIRATORY FAILURE  MIGRAINE HEADACHE BACTERIAL PNEUMONIA NECK PAIN, RIGHT  URI  ELEVATED BLOOD PRESSURE WITHOUT DIAGNOSIS OF HYPERTENSION  INSOMNIA-SLEEP DISORDER-UNSPEC  DEPRESSION CHOLELITHIASIS RENAL CALCULUS, HX OF  ASTHMA   Acute anterolateral wall myocardial infarction.   Bilateral staph aureus pneumonia.   Status post acute respiratory failure secondary to congestive heart failure, secondary to aspiration.   Past Surgical History: Last updated: 09/20/2006 Denies surgical history  Family History: Last updated: 06/22/2007 heart disease DM hypothyroid  Social History: Last updated: 09/13/2009 work - [employer] Former Smoker Alcohol use-yes Married no children Patient states former smoker.   Review of Systems       Denies fever, malais, weight loss, blurry vision, decreased visual acuity, cough, sputum, SOB, hemoptysis, pleuritic pain, palpitaitons, heartburn, abdominal pain, melena, lower extremity edema, claudication, or rash.   Vital Signs:  Patient profile:   35 year old male Height:      73 inches Weight:      231 pounds BMI:     30.59 Pulse rate:   68 / minute Resp:  14 per minute BP sitting:   113 / 76  (right arm)  Vitals Entered By: Kem Parkinson (January 17, 2010 9:43 AM)  Physical Exam  General:  Affect appropriate Healthy:  appears stated age HEENT: normal Neck supple with no adenopathy JVP normal no  bruits no thyromegaly Lungs clear with no wheezing and good diaphragmatic motion Heart:  S1/S2 no murmur,rub, gallop or click PMI normal Abdomen: benighn, BS positve, no tenderness, no AAA no bruit.  No HSM or HJR Distal pulses intact with no bruits No edema Neuro non-focal Skin warm and dry AICD under left calvicle with resolved hematoma    ICD Specifications Following MD:  Sherryl Manges, MD     ICD Vendor:  [employer] Jude     ICD Model Number:  [id number]     ICD Serial Number:  [id number] ICD DOI:  11/27/2009     ICD Implanting MD:  Sherryl Manges, MD  Lead 1:    Location: RV     DOI: 11/27/2009     Model #: [id number]     Serial #: [id number]     Status: active  Indications::  ICM   ICD Follow Up ICD Dependent:  No      Episodes Coumadin:  No  Brady Parameters Mode VVI     Lower Rate Limit:  40      Tachy Zones VF:  206     Impression & Recommendations:  Problem # 1:  MYOCARDIAL INFARCTION (ICD-410.90) Ischemic DCM  Euvolemic.  Watch postural symptoms but will not cut back meds at this time.  BMET and BNP next visti His updated medication list for this problem includes:    Aspirin 81 Mg Tbec (Aspirin) .[name]... Take one tablet by mouth daily    Effient 10 Mg Tabs (Prasugrel hcl) .[name]... Take 1 tablet by mouth once a day    Carvedilol 12.5 Mg Tabs (Carvedilol) .[name]... Take one tablet by mouth twice a day  Problem # 2:  HYPERLIPIDEMIA (ICD-272.4) Labs in 3 months  Continue statin His updated medication list for this problem includes:    Crestor 10 Mg Tabs (Rosuvastatin calcium) .[name]... Take 1 tab by mouth at bedtime  CHOL: 224 (09/19/2006)   LDL: DEL (09/19/2006)   HDL: 32.9 (09/19/2006)   TG: 244 (09/19/2006)  Problem # 3:  AUTOMATIC IMPLANTABLE CARDIAC DEFIBRILLATOR SITU (ICD-V45.02) Site is much improved with no signs of bleeding or infection.  F/U clinic. No D/C  Patient Instructions: 1)  Your physician recommends that you schedule a follow-up appointment in: 3 MONTHS WITH DR Eden Emms 2)  Your  physician recommends that you continue on your current medications as directed. Please refer to the Current Medication list given to you today.

## 2010-03-20 NOTE — Procedures (Signed)
Summary: wound check.[email or web address]   Current Medications (verified): 1)  Nasonex 50 Mcg/act  Susp (Mometasone Furoate) .... 2 Spray/side Once Daily As Needed 2)  Symbicort 80-4.5 Mcg/act  Aero (Budesonide-Formoterol Fumarate) .... 2puffs Two Times A Day As Needed 3)  Lasix 40 Mg Tabs (Furosemide) .... Take One Tablet Daily Up To 2 Tablets As Needed 4)  Crestor 10 Mg Tabs (Rosuvastatin Calcium) .... Take 1 Tab By Mouth At Bedtime 5)  Aspirin 325 Mg  Tabs (Aspirin) .... Take 1 Tablet By Mouth Every Morning 6)  Effient 10 Mg Tabs (Prasugrel Hcl) .... Take 1 Tablet By Mouth Once A Day 7)  Cozaar 50 Mg Tabs (Losartan Potassium) .... Take One Tablet By Mouth Daily 8)  Carvedilol 12.5 Mg Tabs (Carvedilol) .... Take One Tablet By Mouth Twice A Day 9)  Klor-Con M20 20 Meq Cr-Tabs (Potassium Chloride Crys Cr) .... Take One Tablet Once Daily Up To Twice As Needed  Allergies (verified): No Known Drug Allergies   ICD Specifications Following MD:  Sherryl Manges, MD     ICD Vendor:  [employer] Jude     ICD Model Number:  [id number]     ICD Serial Number:  [id number] ICD DOI:  11/27/2009     ICD Implanting MD:  Sherryl Manges, MD  Lead 1:    Location: RV     DOI: 11/27/2009     Model #: [id number]     Serial #: [id number]     Status: active  Indications::  ICM   ICD Follow Up Remote Check?  No Charge Time:  8.0 seconds     Battery Est. Longevity:  8.1 years Underlying rhythm:  SR ICD Dependent:  No       ICD Device Measurements Right Ventricle:  Amplitude: 11.9 mV, Impedance: 560 ohms, Threshold: 0.5 V at 0.5 msec Shock Impedance: 33 ohms   Episodes Coumadin:  No Shock:  0     ATP:  0     Nonsustained:  0     Ventricular Pacing:  0%  Brady Parameters Mode VVI     Lower Rate Limit:  40      Tachy Zones VF:  206     Next Cardiology Appt Due:  03/06/2010 Tech Comments:  Steri strips removed, no redness or edema noted.  Device function normal.  No parameter changes.  ROV 03/06/10 with Dr. Graciela Husbands. Altha Harm, LPN  December 06, 2009 12:54 PM

## 2010-03-20 NOTE — Progress Notes (Signed)
Summary: talk to nurse  Phone Note Call from Patient Call back at Home Phone [phone]   Caller: Patient Reason for Call: Talk to Nurse Summary of Call: pt talked to Dr Graciela Husbands in hospital and was told to come into the ofc tomorrow at 7:45. Dont see anything where he is to come in.  Initial call taken by: Edman Circle,  November 30, 2009 12:25 PM  Follow-up for Phone Call        pt adv that he had compression bandage placed by Dr. Graciela Husbands yesterday and was adv to come in to either office or hospital tomorrow at 745 and to see someone to have changed again. left msg for Dr. Graciela Husbands for clarification and then will adv pt.  Follow-up by: Claris Gladden RN,  November 30, 2009 1:06 PM  Additional Follow-up for Phone Call Additional follow up Details #1::        per Dr. Graciela Husbands pt to go to [facility] and see Dr. Ladona Ridgel. Adv pt of info. Additional Follow-up by: Claris Gladden RN,  November 30, 2009 1:27 PM

## 2010-03-20 NOTE — Assessment & Plan Note (Signed)
Summary: 1 month/ mbw   Visit Type:  Follow-up Copy to:  Dr. Eden Emms Primary Provider/Referring Provider:  Corwin Levins MD  CC:  Pt here for follow up. States no new complaints or concerns. Pt states will be traveling to [place] in November and wants to know if he will need flu shot.  History of Present Illness: 33/M adm 7/1-7/12 for acute MI requiring emergent LAD stent & resp failure requiring mechanical ventilation for MSSA pna ? aspiration. CXR 7/18 improved since 7/1 but rt infiltrates persist He had 1 episode of blood tinged sputum after dc, now on lasix & aldactone, echo showed EF 50-55% & WMA , but overall has continued to improve. he has started mild exercise again, cough has resolved, no fevers. He repors mild intermittent asthma as a child, requiring symbicort on one occasion('allergy season') but never had an exacerbation, his labuterol inhalers would always expire.  October 30, 2009 2:09 PM  EF decreased on cardiac MRI, rptt cath planned.  takes symbicort as needed , no nocturnal symptoms, rpt CXR shows resolution of infiltrates, no cough/ wheeze  Current Medications (verified): 1)  Nasonex 50 Mcg/act  Susp (Mometasone Furoate) .... 2 Spray/side Once Daily As Needed 2)  Symbicort 80-4.5 Mcg/act  Aero (Budesonide-Formoterol Fumarate) .... 2puffs Two Times A Day As Needed 3)  Lasix 40 Mg Tabs (Furosemide) .... Take One Tablet Daily Up To 2 Tablets As Needed 4)  Crestor 10 Mg Tabs (Rosuvastatin Calcium) .... Take 1 Tab By Mouth At Bedtime 5)  Aspirin 325 Mg  Tabs (Aspirin) .... Take 1 Tablet By Mouth Every Morning 6)  Effient 10 Mg Tabs (Prasugrel Hcl) .... Take 1 Tablet By Mouth Once A Day 7)  Ramipril 2.5 Mg Caps (Ramipril) .... Take 1 Tablet By Mouth Once A Day 8)  Metoprolol Succinate 100 Mg Xr24h-Tab (Metoprolol Succinate) .... Take 1 Tablet By Mouth Once A Day 9)  Klor-Con M20 20 Meq Cr-Tabs (Potassium Chloride Crys Cr) .... Take One Tablet Once Daily Up To Twice As  Needed  Allergies (verified): No Known Drug Allergies  Past History:  Past Medical History: Last updated: 09/18/2009 MYOCARDIAL INFARCTION CAD  HYPERLIPIDEMIA  RENAL INSUFFICIENCY RHABDOMYOLYSIS HYPERCHOLESTEROLEMIA  UNSPECIFIED HEART FAILURE  RESPIRATORY FAILURE  MIGRAINE HEADACHE BACTERIAL PNEUMONIA NECK PAIN, RIGHT  URI  ELEVATED BLOOD PRESSURE WITHOUT DIAGNOSIS OF HYPERTENSION  INSOMNIA-SLEEP DISORDER-UNSPEC  DEPRESSION CHOLELITHIASIS RENAL CALCULUS, HX OF  ASTHMA   Acute anterolateral wall myocardial infarction.   Bilateral staph aureus pneumonia.   Status post acute respiratory failure secondary to congestive heart failure, secondary to aspiration.   Social History: Last updated: 09/13/2009 work - [employer] Former Smoker Alcohol use-yes Married no children Patient states former smoker.   Review of Systems  The patient denies anorexia, fever, weight loss, weight gain, vision loss, decreased hearing, hoarseness, chest pain, syncope, dyspnea on exertion, peripheral edema, prolonged cough, headaches, hemoptysis, abdominal pain, melena, hematochezia, severe indigestion/heartburn, hematuria, muscle weakness, suspicious skin lesions, difficulty walking, depression, unusual weight change, abnormal bleeding, enlarged lymph nodes, and angioedema.    Vital Signs:  Patient profile:   35 year old male Height:      73 inches Weight:      220.50 pounds BMI:     29.20 O2 Sat:      100 % on Room air Temp:     98.2 degrees F oral Pulse rate:   68 / minute BP sitting:   106 / 62  (right arm) Cuff size:   regular  Vitals  Entered By: Zackery Barefoot CMA (October 30, 2009 2:01 PM)  O2 Flow:  Room air CC: Pt here for follow up. States no new complaints or concerns. Pt states will be traveling to [place] in November and wants to know if he will need flu shot Comments Medications reviewed with patient Verified contact number and pharmacy with patient Zackery Barefoot  CMA  October 30, 2009 2:02 PM    Physical Exam  Additional Exam:  Gen. Pleasant, well-nourished, in no distress, normal affect ENT - no lesions, no post nasal drip Neck: No JVD, no thyromegaly, no carotid bruits Lungs: no use of accessory muscles, no dullness to percussion, clear without rales or rhonchi  Cardiovascular: Rhythm regular, heart sounds  normal, no murmurs or gallops, no peripheral edema      Impression & Recommendations:  Problem # 1:  BACTERIAL PNEUMONIA (ICD-482.9)  -resolved  Orders: Est. Patient Level II (62703)  Problem # 2:  ASTHMA (ICD-493.90) -mild intermittent Use albuterol MDI as needed , OK to stay off symbicort  Patient Instructions: 1)  Copy sent to: 2)  Please schedule a follow-up appointment as needed.

## 2010-03-22 NOTE — Assessment & Plan Note (Signed)
Summary: [email or web address] [email or web address]   Visit Type:  ICD-St. Jude Referring Provider:  Dr. Eden Emms Primary Provider:  Corwin Levins MD  CC:  no complaints.  History of Present Illness: Mr. 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 The procedure was compensated by hematoma.  The patient denies SOB, chest pain, edema or palpitations   Problems Prior to Update: 1)  Automatic Implantable Cardiac Defibrillator Situ  (ICD-V45.02) 2)  Pre-operative Cardiovascular Examination  (ICD-V72.81) 3)  Chest Pain Unspecified  (ICD-786.50) 4)  Myocardial Infarction  (ICD-410.90) 5)  Cardiomyopathy, Ischemic  (ICD-414.8) 6)  Hyperlipidemia  (ICD-272.4) 7)  Renal Insufficiency  (ICD-588.9) 8)  Rhabdomyolysis  (ICD-728.88) 9)  Hypercholesterolemia  (ICD-272.0) 10)  Unspecified Heart Failure  (ICD-428.9) 11)  Respiratory Failure  (ICD-518.81) 12)  Migraine Headache  (ICD-346.90) 13)  Bacterial Pneumonia  (ICD-482.9) 14)  Neck Pain, Right  (ICD-723.1) 15)  Uri  (ICD-465.9) 16)  Elevated Blood Pressure Without Diagnosis of Hypertension  (ICD-796.2) 17)  Insomnia-sleep Disorder-unspec  (ICD-780.52) 18)  Depression  (ICD-311) 19)  Cholelithiasis  (ICD-574.20) 20)  Renal Calculus, Hx of  (ICD-V13.01) 21)  Asthma  (ICD-493.90)  Current Medications (verified): 1)  Nasonex 50 Mcg/act  Susp (Mometasone Furoate) .... 2 Spray/side Once Daily As Needed 2)  Symbicort 80-4.5 Mcg/act  Aero (Budesonide-Formoterol Fumarate) .... 2puffs Two Times A Day As Needed 3)  Lasix 40 Mg Tabs (Furosemide) .... Take One Tablet Once Daily As Needed 4)  Crestor 10 Mg Tabs (Rosuvastatin Calcium) .... Take 1 Tab By Mouth At Bedtime 5)  Aspirin 81 Mg Tbec (Aspirin) .... Take One Tablet By Mouth Daily 6)  Effient 10 Mg Tabs (Prasugrel Hcl) .... Take 1 Tablet By Mouth Once A Day 7)  Cozaar 50 Mg Tabs (Losartan Potassium) .... Take One Tablet  By Mouth Daily 8)  Carvedilol 12.5 Mg Tabs (Carvedilol) .... Take One Tablet By Mouth Twice A Day 9)  Klor-Con M20 20 Meq Cr-Tabs (Potassium Chloride Crys Cr) .... Take One Tablet Once Daily As Needed  Allergies (verified): No Known Drug Allergies  Past History:  Past Medical History: Last updated: 09/18/2009 MYOCARDIAL INFARCTION CAD  HYPERLIPIDEMIA  RENAL INSUFFICIENCY RHABDOMYOLYSIS HYPERCHOLESTEROLEMIA  UNSPECIFIED HEART FAILURE  RESPIRATORY FAILURE  MIGRAINE HEADACHE BACTERIAL PNEUMONIA NECK PAIN, RIGHT  URI  ELEVATED BLOOD PRESSURE WITHOUT DIAGNOSIS OF HYPERTENSION  INSOMNIA-SLEEP DISORDER-UNSPEC  DEPRESSION CHOLELITHIASIS RENAL CALCULUS, HX OF  ASTHMA   Acute anterolateral wall myocardial infarction.   Bilateral staph aureus pneumonia.   Status post acute respiratory failure secondary to congestive heart failure, secondary to aspiration.   Past Surgical History: Last updated: 09/20/2006 Denies surgical history  Family History: Last updated: 06/22/2007 heart disease DM hypothyroid  Social History: Last updated: 09/13/2009 work - [employer] Former Smoker Alcohol use-yes Married no children Patient states former smoker.   Risk Factors: Alcohol Use: <1 (09/13/2009)  Risk Factors: Smoking Status: quit (09/13/2009) Packs/Day: <0.25 (09/13/2009)  Vital Signs:  Patient profile:   35 year old male Height:      73 inches Weight:      240.25 pounds BMI:     31.81 Pulse rate:   74 / minute BP sitting:   110 / 71  (left arm) Cuff size:   large  Vitals Entered By: Caralee Ates CMA (March 06, 2010 9:42 AM)  Physical Exam  General:  The patient was alert and oriented in no acute distress. HEENT Normal.  Neck veins  were flat, carotids were brisk.  Lungs were clear.  Heart sounds were regular without murmurs or gallops.  Abdomen was soft with active bowel sounds. There is no clubbing cyanosis or edema. Skin Warm and dry woulnd well healed    ICD  Specifications Following MD:  Sherryl Manges, MD     ICD Vendor:  St Jude     ICD Model Number:  [id number]     ICD Serial Number:  [id number] ICD DOI:  11/27/2009     ICD Implanting MD:  Sherryl Manges, MD  Lead 1:    Location: RV     DOI: 11/27/2009     Model #: [id number]     Serial #: [id number]     Status: active  Indications::  ICM   ICD Follow Up Battery Voltage:  93% V     Charge Time:  8.0 seconds     Battery Est. Longevity:  8.8-9.3 yrs Underlying rhythm:  SR ICD Dependent:  No       ICD Device Measurements Right Ventricle:  Amplitude: 12.0 mV, Impedance: 580 ohms, Threshold: 0.5 V at 0.5 msec Shock Impedance: 43 ohms   Episodes MS Episodes:  0     Coumadin:  No Shock:  0     ATP:  0     Nonsustained:  0     Atrial Therapies:  0 Ventricular Pacing:  <1%  Brady Parameters Mode VVI     Lower Rate Limit:  40      Tachy Zones VF:  206     Next Remote Date:  06/07/2010     Next Cardiology Appt Due:  02/19/2011 Tech Comments:  NORMAL DEVICE FUNCTION.  NO EPISODES SINCE LAST CHECK.  CHANGED RV OUTPUT FROM 3.5 TO 2.5 V.  PT ENROLLED IN MERLIN.  MERLIN 06-07-10 AND ROV IN 12 MTHS W/SK. Vella Kohler  March 06, 2010 9:46 AM  Impression & Recommendations:  Problem # 1:  AUTOMATIC IMPLANTABLE CARDIAC DEFIBRILLATOR SITU (ICD-V45.02) Device parameters and data were reviewed and no changes were made  Problem # 2:  CARDIOMYOPATHY, ISCHEMIC (ICD-414.8) stable on current meds His updated medication list for this problem includes:    Lasix 40 Mg Tabs (Furosemide) .[name]... Take one tablet once daily as needed    Aspirin 81 Mg Tbec (Aspirin) .[name]... Take one tablet by mouth daily    Effient 10 Mg Tabs (Prasugrel hcl) .[name]... Take 1 tablet by mouth once a day    Cozaar 50 Mg Tabs (Losartan potassium) .[name]... Take one tablet by mouth daily    Carvedilol 12.5 Mg Tabs (Carvedilol) .[name]... Take one tablet by mouth twice a day  Patient Instructions: 1)  Your physician recommends that you continue on your current  medications as directed. Please refer to the Current Medication list given to you today. 2)  Your physician wants you to follow-up in: OCTOBER 2012  You will receive a reminder letter in the mail two months in advance. If you don't receive a letter, please call our office to schedule the follow-up appointment.

## 2010-03-22 NOTE — Cardiovascular Report (Signed)
Summary: Office Visit   Office Visit   Imported By: Roderic Ovens 03/12/2010 11:10:41  _____________________________________________________________________  External Attachment:    Type:   Image     Comment:   External Document

## 2010-04-27 ENCOUNTER — Encounter: Payer: Self-pay | Admitting: Cardiovascular Disease

## 2010-04-27 ENCOUNTER — Ambulatory Visit (INDEPENDENT_AMBULATORY_CARE_PROVIDER_SITE_OTHER): Payer: PRIVATE HEALTH INSURANCE | Admitting: Cardiovascular Disease

## 2010-04-27 ENCOUNTER — Other Ambulatory Visit: Payer: Self-pay | Admitting: Cardiovascular Disease

## 2010-04-27 DIAGNOSIS — E785 Hyperlipidemia, unspecified: Secondary | ICD-10-CM

## 2010-04-27 DIAGNOSIS — N259 Disorder resulting from impaired renal tubular function, unspecified: Secondary | ICD-10-CM

## 2010-04-27 DIAGNOSIS — I2589 Other forms of chronic ischemic heart disease: Secondary | ICD-10-CM

## 2010-04-27 DIAGNOSIS — I2510 Atherosclerotic heart disease of native coronary artery without angina pectoris: Secondary | ICD-10-CM

## 2010-04-27 DIAGNOSIS — E7800 Pure hypercholesterolemia, unspecified: Secondary | ICD-10-CM

## 2010-04-27 DIAGNOSIS — I509 Heart failure, unspecified: Secondary | ICD-10-CM

## 2010-04-27 LAB — BASIC METABOLIC PANEL
CO2: 30 mEq/L (ref 19–32)
Chloride: 103 mEq/L (ref 96–112)
Creatinine, Ser: 1 mg/dL (ref 0.4–1.5)

## 2010-04-27 LAB — HEPATIC FUNCTION PANEL
ALT: 28 U/L (ref 0–53)
AST: 24 U/L (ref 0–37)
Total Bilirubin: 0.7 mg/dL (ref 0.3–1.2)

## 2010-05-03 LAB — BASIC METABOLIC PANEL
CO2: 24 mEq/L (ref 19–32)
Chloride: 107 mEq/L (ref 96–112)
Glucose, Bld: 86 mg/dL (ref 70–99)
Potassium: 4 mEq/L (ref 3.5–5.1)
Sodium: 139 mEq/L (ref 135–145)

## 2010-05-03 LAB — CBC
HCT: 42.2 % (ref 39.0–52.0)
Hemoglobin: 13.9 g/dL (ref 13.0–17.0)
MCH: 26.4 pg (ref 26.0–34.0)
MCHC: 32.9 g/dL (ref 30.0–36.0)
MCV: 80.2 fL (ref 78.0–100.0)

## 2010-05-03 LAB — SURGICAL PCR SCREEN: MRSA, PCR: NEGATIVE

## 2010-05-06 LAB — COMPREHENSIVE METABOLIC PANEL
ALT: 189 U/L — ABNORMAL HIGH (ref 0–53)
ALT: 201 U/L — ABNORMAL HIGH (ref 0–53)
ALT: 58 U/L — ABNORMAL HIGH (ref 0–53)
AST: 1001 U/L — ABNORMAL HIGH (ref 0–37)
AST: 217 U/L — ABNORMAL HIGH (ref 0–37)
AST: 305 U/L — ABNORMAL HIGH (ref 0–37)
Albumin: 2.3 g/dL — ABNORMAL LOW (ref 3.5–5.2)
Albumin: 2.5 g/dL — ABNORMAL LOW (ref 3.5–5.2)
Albumin: 3.2 g/dL — ABNORMAL LOW (ref 3.5–5.2)
Albumin: 4.1 g/dL (ref 3.5–5.2)
Alkaline Phosphatase: 29 U/L — ABNORMAL LOW (ref 39–117)
Alkaline Phosphatase: 46 U/L (ref 39–117)
BUN: 11 mg/dL (ref 6–23)
BUN: 20 mg/dL (ref 6–23)
CO2: 22 mEq/L (ref 19–32)
CO2: 26 mEq/L (ref 19–32)
Calcium: 6.6 mg/dL — ABNORMAL LOW (ref 8.4–10.5)
Calcium: 7.2 mg/dL — ABNORMAL LOW (ref 8.4–10.5)
Calcium: 7.5 mg/dL — ABNORMAL LOW (ref 8.4–10.5)
Calcium: 8.1 mg/dL — ABNORMAL LOW (ref 8.4–10.5)
Creatinine, Ser: 1.34 mg/dL (ref 0.4–1.5)
Creatinine, Ser: 1.46 mg/dL (ref 0.4–1.5)
GFR calc Af Amer: 51 mL/min — ABNORMAL LOW (ref >60)
GFR calc Af Amer: 55 mL/min — ABNORMAL LOW (ref >60)
GFR calc Af Amer: >60 mL/min (ref >60)
GFR calc Af Amer: >60 mL/min (ref >60)
GFR calc non Af Amer: 56 mL/min — ABNORMAL LOW (ref >60)
GFR calc non Af Amer: >60 mL/min (ref >60)
Potassium: 3.8 mEq/L (ref 3.5–5.1)
Potassium: 5.4 mEq/L — ABNORMAL HIGH (ref 3.5–5.1)
Sodium: 135 mEq/L (ref 135–145)
Sodium: 135 mEq/L (ref 135–145)
Sodium: 143 mEq/L (ref 135–145)
Total Bilirubin: 1.2 mg/dL (ref 0.3–1.2)
Total Protein: 5.4 g/dL — ABNORMAL LOW (ref 6.0–8.3)
Total Protein: 5.4 g/dL — ABNORMAL LOW (ref 6.0–8.3)
Total Protein: 5.7 g/dL — ABNORMAL LOW (ref 6.0–8.3)
Total Protein: 8.1 g/dL (ref 6.0–8.3)

## 2010-05-06 LAB — BASIC METABOLIC PANEL
BUN: 11 mg/dL (ref 6–23)
BUN: 22 mg/dL (ref 6–23)
CO2: 25 mEq/L (ref 19–32)
CO2: 25 mEq/L (ref 19–32)
CO2: 26 mEq/L (ref 19–32)
CO2: 26 mEq/L (ref 19–32)
Calcium: 6.8 mg/dL — ABNORMAL LOW (ref 8.4–10.5)
Calcium: 8.4 mg/dL (ref 8.4–10.5)
Calcium: 8.7 mg/dL (ref 8.4–10.5)
Chloride: 107 mEq/L (ref 96–112)
Chloride: 113 mEq/L — ABNORMAL HIGH (ref 96–112)
Chloride: 114 mEq/L — ABNORMAL HIGH (ref 96–112)
Creatinine, Ser: 1.23 mg/dL (ref 0.4–1.5)
Creatinine, Ser: 1.64 mg/dL — ABNORMAL HIGH (ref 0.4–1.5)
GFR calc Af Amer: >60 mL/min (ref >60)
GFR calc Af Amer: >60 mL/min (ref >60)
GFR calc Af Amer: >60 mL/min (ref >60)
GFR calc non Af Amer: >60 mL/min (ref >60)
GFR calc non Af Amer: >60 mL/min (ref >60)
GFR calc non Af Amer: >60 mL/min (ref >60)
Glucose, Bld: 106 mg/dL — ABNORMAL HIGH (ref 70–99)
Glucose, Bld: 127 mg/dL — ABNORMAL HIGH (ref 70–99)
Glucose, Bld: 93 mg/dL (ref 70–99)
Glucose, Bld: 93 mg/dL (ref 70–99)
Potassium: 3.4 mEq/L — ABNORMAL LOW (ref 3.5–5.1)
Potassium: 3.6 mEq/L (ref 3.5–5.1)
Potassium: 4.1 mEq/L (ref 3.5–5.1)
Potassium: 4.6 mEq/L (ref 3.5–5.1)
Sodium: 140 mEq/L (ref 135–145)
Sodium: 140 mEq/L (ref 135–145)
Sodium: 144 mEq/L (ref 135–145)
Sodium: 145 mEq/L (ref 135–145)

## 2010-05-06 LAB — GLUCOSE, CAPILLARY
Glucose-Capillary: 102 mg/dL — ABNORMAL HIGH (ref 70–99)
Glucose-Capillary: 104 mg/dL — ABNORMAL HIGH (ref 70–99)
Glucose-Capillary: 104 mg/dL — ABNORMAL HIGH (ref 70–99)
Glucose-Capillary: 108 mg/dL — ABNORMAL HIGH (ref 70–99)
Glucose-Capillary: 109 mg/dL — ABNORMAL HIGH (ref 70–99)
Glucose-Capillary: 109 mg/dL — ABNORMAL HIGH (ref 70–99)
Glucose-Capillary: 109 mg/dL — ABNORMAL HIGH (ref 70–99)
Glucose-Capillary: 117 mg/dL — ABNORMAL HIGH (ref 70–99)
Glucose-Capillary: 118 mg/dL — ABNORMAL HIGH (ref 70–99)
Glucose-Capillary: 119 mg/dL — ABNORMAL HIGH (ref 70–99)
Glucose-Capillary: 120 mg/dL — ABNORMAL HIGH (ref 70–99)
Glucose-Capillary: 121 mg/dL — ABNORMAL HIGH (ref 70–99)
Glucose-Capillary: 121 mg/dL — ABNORMAL HIGH (ref 70–99)
Glucose-Capillary: 121 mg/dL — ABNORMAL HIGH (ref 70–99)
Glucose-Capillary: 123 mg/dL — ABNORMAL HIGH (ref 70–99)
Glucose-Capillary: 129 mg/dL — ABNORMAL HIGH (ref 70–99)
Glucose-Capillary: 130 mg/dL — ABNORMAL HIGH (ref 70–99)
Glucose-Capillary: 139 mg/dL — ABNORMAL HIGH (ref 70–99)
Glucose-Capillary: 221 mg/dL — ABNORMAL HIGH (ref 70–99)
Glucose-Capillary: 79 mg/dL (ref 70–99)
Glucose-Capillary: 94 mg/dL (ref 70–99)
Glucose-Capillary: 94 mg/dL (ref 70–99)
Glucose-Capillary: 98 mg/dL (ref 70–99)

## 2010-05-06 LAB — CARDIAC PANEL(CRET KIN+CKTOT+MB+TROPI)
CK, MB: 215.4 ng/mL (ref 0.3–4.0)
CK, MB: 3.7 ng/mL (ref 0.3–4.0)
CK, MB: 357.8 ng/mL (ref 0.3–4.0)
CK, MB: 6.1 ng/mL (ref 0.3–4.0)
CK, MB: 7.9 ng/mL (ref 0.3–4.0)
CK, MB: >1000 ng/mL (ref 0.3–4.0)
Relative Index: 0.1 (ref 0.0–2.5)
Relative Index: 0.1 (ref 0.0–2.5)
Relative Index: 0.2 (ref 0.0–2.5)
Relative Index: 0.9 (ref 0.0–2.5)
Relative Index: 1.2 (ref 0.0–2.5)
Relative Index: 7.9 — ABNORMAL HIGH (ref 0.0–2.5)
Total CK: 10398 U/L — ABNORMAL HIGH (ref 7–232)
Total CK: 2339 U/L — ABNORMAL HIGH (ref 7–232)
Total CK: 3331 U/L — ABNORMAL HIGH (ref 7–232)
Total CK: 4525 U/L — ABNORMAL HIGH (ref 7–232)
Total CK: 518 U/L — ABNORMAL HIGH (ref 7–232)
Total CK: 5431 U/L — ABNORMAL HIGH (ref 7–232)
Troponin I: 45.24 ng/mL (ref 0.00–0.06)
Troponin I: >100 ng/mL (ref 0.00–0.06)
Troponin I: >100 ng/mL (ref 0.00–0.06)
Troponin I: >100 ng/mL (ref 0.00–0.06)

## 2010-05-06 LAB — BLOOD GAS, ARTERIAL
Acid-Base Excess: 2.5 mmol/L — ABNORMAL HIGH (ref 0.0–2.0)
Acid-Base Excess: 3.3 mmol/L — ABNORMAL HIGH (ref 0.0–2.0)
Acid-Base Excess: 3.3 mmol/L — ABNORMAL HIGH (ref 0.0–2.0)
Acid-base deficit: 0.2 mmol/L (ref 0.0–2.0)
Acid-base deficit: 0.2 mmol/L (ref 0.0–2.0)
Acid-base deficit: 2.8 mmol/L — ABNORMAL HIGH (ref 0.0–2.0)
Bicarbonate: 19.6 mEq/L — ABNORMAL LOW (ref 20.0–24.0)
Bicarbonate: 22.6 mEq/L (ref 20.0–24.0)
Bicarbonate: 23.9 mEq/L (ref 20.0–24.0)
Bicarbonate: 26.8 mEq/L — ABNORMAL HIGH (ref 20.0–24.0)
Drawn by: 280971
Drawn by: 299251
Drawn by: 30996
Expiratory PAP: 6
FIO2: 0.4 %
FIO2: 0.4 %
FIO2: 0.4 %
FIO2: 0.7 %
FIO2: 1 %
Inspiratory PAP: 12
MECHVT: 490 mL
MECHVT: 490 mL
MECHVT: 490 mL
MECHVT: 490 mL
O2 Saturation: 100 %
O2 Saturation: 93.2 %
O2 Saturation: 97.4 %
O2 Saturation: 98.8 %
O2 Saturation: 99.4 %
O2 Saturation: 99.9 %
PEEP: 5 cmH2O
PEEP: 5 cmH2O
PEEP: 8 cmH2O
Patient temperature: 102
Patient temperature: 102.5
Patient temperature: 98.6
Patient temperature: 98.6
Patient temperature: 98.6
RATE: 15 resp/min
RATE: 18 resp/min
RATE: 24 resp/min
TCO2: 22.5 mmol/L (ref 0–100)
TCO2: 26.3 mmol/L (ref 0–100)
TCO2: 26.4 mmol/L (ref 0–100)
TCO2: 28.2 mmol/L (ref 0–100)
pCO2 arterial: 31.7 mmHg — ABNORMAL LOW (ref 35.0–45.0)
pCO2 arterial: 32.6 mmHg — ABNORMAL LOW (ref 35.0–45.0)
pCO2 arterial: 36.8 mmHg (ref 35.0–45.0)
pCO2 arterial: 47.9 mmHg — ABNORMAL HIGH (ref 35.0–45.0)
pH, Arterial: 7.306 — ABNORMAL LOW (ref 7.350–7.450)
pH, Arterial: 7.378 (ref 7.350–7.450)
pH, Arterial: 7.396 (ref 7.350–7.450)
pO2, Arterial: 108 mmHg — ABNORMAL HIGH (ref 80.0–100.0)
pO2, Arterial: 115 mmHg — ABNORMAL HIGH (ref 80.0–100.0)
pO2, Arterial: 137 mmHg — ABNORMAL HIGH (ref 80.0–100.0)
pO2, Arterial: 329 mmHg — ABNORMAL HIGH (ref 80.0–100.0)
pO2, Arterial: 71.8 mmHg — ABNORMAL LOW (ref 80.0–100.0)
pO2, Arterial: 83.5 mmHg (ref 80.0–100.0)

## 2010-05-06 LAB — CBC
HCT: 35 % — ABNORMAL LOW (ref 39.0–52.0)
HCT: 36.9 % — ABNORMAL LOW (ref 39.0–52.0)
HCT: 38.9 % — ABNORMAL LOW (ref 39.0–52.0)
HCT: 39.8 % (ref 39.0–52.0)
HCT: 40.1 % (ref 39.0–52.0)
HCT: 53 % — ABNORMAL HIGH (ref 39.0–52.0)
Hemoglobin: 11.7 g/dL — ABNORMAL LOW (ref 13.0–17.0)
Hemoglobin: 11.7 g/dL — ABNORMAL LOW (ref 13.0–17.0)
Hemoglobin: 12.1 g/dL — ABNORMAL LOW (ref 13.0–17.0)
Hemoglobin: 12.1 g/dL — ABNORMAL LOW (ref 13.0–17.0)
Hemoglobin: 12.2 g/dL — ABNORMAL LOW (ref 13.0–17.0)
Hemoglobin: 12.7 g/dL — ABNORMAL LOW (ref 13.0–17.0)
Hemoglobin: 13.1 g/dL (ref 13.0–17.0)
Hemoglobin: 13.1 g/dL (ref 13.0–17.0)
Hemoglobin: 15.1 g/dL (ref 13.0–17.0)
Hemoglobin: 17.1 g/dL — ABNORMAL HIGH (ref 13.0–17.0)
Hemoglobin: 17.7 g/dL — ABNORMAL HIGH (ref 13.0–17.0)
MCH: 27.7 pg (ref 26.0–34.0)
MCH: 27.8 pg (ref 26.0–34.0)
MCH: 27.8 pg (ref 26.0–34.0)
MCH: 27.9 pg (ref 26.0–34.0)
MCH: 27.9 pg (ref 26.0–34.0)
MCH: 28.3 pg (ref 26.0–34.0)
MCHC: 32.6 g/dL (ref 30.0–36.0)
MCHC: 32.7 g/dL (ref 30.0–36.0)
MCHC: 32.7 g/dL (ref 30.0–36.0)
MCHC: 32.8 g/dL (ref 30.0–36.0)
MCHC: 32.9 g/dL (ref 30.0–36.0)
MCHC: 33.3 g/dL (ref 30.0–36.0)
MCHC: 33.4 g/dL (ref 30.0–36.0)
MCV: 83.7 fL (ref 78.0–100.0)
MCV: 85 fL (ref 78.0–100.0)
MCV: 85.3 fL (ref 78.0–100.0)
Platelets: 205 10*3/uL (ref 150–400)
Platelets: 300 10*3/uL (ref 150–400)
Platelets: 328 10*3/uL (ref 150–400)
RBC: 4.3 MIL/uL (ref 4.22–5.81)
RBC: 4.35 MIL/uL (ref 4.22–5.81)
RBC: 4.36 MIL/uL (ref 4.22–5.81)
RBC: 5.42 MIL/uL (ref 4.22–5.81)
RBC: 6.1 MIL/uL — ABNORMAL HIGH (ref 4.22–5.81)
RBC: 6.38 MIL/uL — ABNORMAL HIGH (ref 4.22–5.81)
RDW: 15.1 % (ref 11.5–15.5)
RDW: 15.6 % — ABNORMAL HIGH (ref 11.5–15.5)
WBC: 13.8 10*3/uL — ABNORMAL HIGH (ref 4.0–10.5)
WBC: 16.5 10*3/uL — ABNORMAL HIGH (ref 4.0–10.5)
WBC: 16.5 10*3/uL — ABNORMAL HIGH (ref 4.0–10.5)
WBC: 19.5 10*3/uL — ABNORMAL HIGH (ref 4.0–10.5)
WBC: 23.3 10*3/uL — ABNORMAL HIGH (ref 4.0–10.5)
WBC: 25.9 10*3/uL — ABNORMAL HIGH (ref 4.0–10.5)
WBC: 35.3 10*3/uL — ABNORMAL HIGH (ref 4.0–10.5)

## 2010-05-06 LAB — HEPATIC FUNCTION PANEL
Alkaline Phosphatase: 40 U/L (ref 39–117)
Indirect Bilirubin: 1 mg/dL — ABNORMAL HIGH (ref 0.3–0.9)
Total Bilirubin: 1.3 mg/dL — ABNORMAL HIGH (ref 0.3–1.2)

## 2010-05-06 LAB — PHOSPHORUS
Phosphorus: 1.6 mg/dL — ABNORMAL LOW (ref 2.3–4.6)
Phosphorus: 4.4 mg/dL (ref 2.3–4.6)

## 2010-05-06 LAB — VANCOMYCIN, TROUGH: Vancomycin Tr: 11.2 ug/mL (ref 10.0–20.0)

## 2010-05-06 LAB — DIFFERENTIAL
Basophils Relative: 0 % (ref 0–1)
Basophils Relative: 0 % (ref 0–1)
Eosinophils Absolute: 0.6 10*3/uL (ref 0.0–0.7)
Eosinophils Relative: 0 % (ref 0–5)
Eosinophils Relative: 3 % (ref 0–5)
Lymphocytes Relative: 3 % — ABNORMAL LOW (ref 12–46)
Lymphocytes Relative: 6 % — ABNORMAL LOW (ref 12–46)
Lymphocytes Relative: 7 % — ABNORMAL LOW (ref 12–46)
Lymphs Abs: 1.3 10*3/uL (ref 0.7–4.0)
Lymphs Abs: 1.6 10*3/uL (ref 0.7–4.0)
Monocytes Absolute: 1.6 10*3/uL — ABNORMAL HIGH (ref 0.1–1.0)
Monocytes Relative: 4 % (ref 3–12)
Monocytes Relative: 6 % (ref 3–12)
Monocytes Relative: 7 % (ref 3–12)
Neutro Abs: 19.7 10*3/uL — ABNORMAL HIGH (ref 1.7–7.7)
Neutro Abs: 32.8 10*3/uL — ABNORMAL HIGH (ref 1.7–7.7)
Neutrophils Relative %: 85 % — ABNORMAL HIGH (ref 43–77)

## 2010-05-06 LAB — CULTURE, BLOOD (ROUTINE X 2)
Culture: NO GROWTH
Culture: NO GROWTH
Culture: NO GROWTH
Culture: NO GROWTH
Culture: NO GROWTH

## 2010-05-06 LAB — LIPID PANEL
Cholesterol: 381 mg/dL — ABNORMAL HIGH (ref 0–200)
LDL Cholesterol: 338 mg/dL — ABNORMAL HIGH (ref 0–99)
Total CHOL/HDL Ratio: 17.3 RATIO

## 2010-05-06 LAB — BRAIN NATRIURETIC PEPTIDE
Pro B Natriuretic peptide (BNP): 322 pg/mL — ABNORMAL HIGH (ref 0.0–100.0)
Pro B Natriuretic peptide (BNP): 393 pg/mL — ABNORMAL HIGH (ref 0.0–100.0)
Pro B Natriuretic peptide (BNP): 724 pg/mL — ABNORMAL HIGH (ref 0.0–100.0)
Pro B Natriuretic peptide (BNP): 996 pg/mL — ABNORMAL HIGH (ref 0.0–100.0)

## 2010-05-06 LAB — URINE CULTURE
Colony Count: NO GROWTH
Special Requests: NEGATIVE

## 2010-05-06 LAB — CULTURE, RESPIRATORY W GRAM STAIN: Culture: NO GROWTH

## 2010-05-06 LAB — URINALYSIS, ROUTINE W REFLEX MICROSCOPIC
Bilirubin Urine: NEGATIVE
Glucose, UA: NEGATIVE mg/dL
Ketones, ur: NEGATIVE mg/dL
Leukocytes, UA: NEGATIVE
Protein, ur: 100 mg/dL — AB

## 2010-05-06 LAB — CK TOTAL AND CKMB (NOT AT ARMC)
CK, MB: 168 ng/mL (ref 0.3–4.0)
CK, MB: 38.6 ng/mL (ref 0.3–4.0)
CK, MB: 6.5 ng/mL (ref 0.3–4.0)
Relative Index: 1.9 (ref 0.0–2.5)
Relative Index: 6 — ABNORMAL HIGH (ref 0.0–2.5)
Total CK: 1988 U/L — ABNORMAL HIGH (ref 7–232)
Total CK: 2799 U/L — ABNORMAL HIGH (ref 7–232)

## 2010-05-06 LAB — EXPECTORATED SPUTUM ASSESSMENT W GRAM STAIN, RFLX TO RESP C

## 2010-05-06 LAB — HEMOGLOBIN A1C: Mean Plasma Glucose: 97 mg/dL (ref <117)

## 2010-05-06 LAB — POCT CARDIAC MARKERS: Myoglobin, poc: 244 ng/mL (ref 12–200)

## 2010-05-06 LAB — MAGNESIUM
Magnesium: 1.9 mg/dL (ref 1.5–2.5)
Magnesium: 2.1 mg/dL (ref 1.5–2.5)
Magnesium: 2.3 mg/dL (ref 1.5–2.5)
Magnesium: 2.3 mg/dL (ref 1.5–2.5)

## 2010-05-06 LAB — PROTIME-INR: INR: 1.1 (ref 0.00–1.49)

## 2010-05-06 LAB — MRSA PCR SCREENING: MRSA by PCR: NEGATIVE

## 2010-05-06 LAB — LACTIC ACID, PLASMA: Lactic Acid, Venous: 2.2 mmol/L (ref 0.5–2.2)

## 2010-05-06 LAB — URINE MICROSCOPIC-ADD ON

## 2010-05-08 NOTE — Assessment & Plan Note (Signed)
Summary: PER CHECK OUT /SF/JT   Referring Provider:  Dr. Eden Emms Primary Provider:  Corwin Levins MD  CC:  chest pressure also anxiety.  History of Present Illness: Jason Hogan is seen todya for F/U of CAD with large Anterior M.I. and collateralized silent distal RCA occlusion. Recent AICD implant with resolving hematoma.  Mild postural symptoms.  Compliant with meds.  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  No SSCP, palpitatons, AICD D/C or edema.  Hair and scalp improved on coreg and no rash with cozaar.  Did not tolerate aldactone post m.i. with excessive fatiigue.  Weight is up Discussed low carb diet.  Eating too many sweets.  BNP only 135.      Current Problems (verified): 1)  Automatic Implantable Cardiac Defibrillator Situ  (ICD-V45.02) 2)  Pre-operative Cardiovascular Examination  (ICD-V72.81) 3)  Chest Pain Unspecified  (ICD-786.50) 4)  Myocardial Infarction  (ICD-410.90) 5)  Cardiomyopathy, Ischemic  (ICD-414.8) 6)  Hyperlipidemia  (ICD-272.4) 7)  Renal Insufficiency  (ICD-588.9) 8)  Rhabdomyolysis  (ICD-728.88) 9)  Hypercholesterolemia  (ICD-272.0) 10)  Unspecified Heart Failure  (ICD-428.9) 11)  Respiratory Failure  (ICD-518.81) 12)  Migraine Headache  (ICD-346.90) 13)  Bacterial Pneumonia  (ICD-482.9) 14)  Neck Pain, Right  (ICD-723.1) 15)  Uri  (ICD-465.9) 16)  Elevated Blood Pressure Without Diagnosis of Hypertension  (ICD-796.2) 17)  Insomnia-sleep Disorder-unspec  (ICD-780.52) 18)  Depression  (ICD-311) 19)  Cholelithiasis  (ICD-574.20) 20)  Renal Calculus, Hx of  (ICD-V13.01) 21)  Asthma  (ICD-493.90)  Current Medications (verified): 1)  Nasonex 50 Mcg/act  Susp (Mometasone Furoate) .... 2 Spray/side Once Daily As Needed 2)  Symbicort 80-4.5 Mcg/act  Aero (Budesonide-Formoterol Fumarate) .... 2puffs Two Times A Day As Needed 3)  Lasix 40 Mg Tabs (Furosemide) .... Take One Tablet Once Daily As Needed 4)  Crestor 10 Mg Tabs (Rosuvastatin Calcium) ....  Take 1 Tab By Mouth At Bedtime 5)  Aspirin 81 Mg Tbec (Aspirin) .... Take One Tablet By Mouth Daily 6)  Effient 10 Mg Tabs (Prasugrel Hcl) .... Take 1 Tablet By Mouth Once A Day 7)  Cozaar 50 Mg Tabs (Losartan Potassium) .... Take One Tablet By Mouth Daily 8)  Carvedilol 12.5 Mg Tabs (Carvedilol) .... Take One Tablet By Mouth Twice A Day 9)  Klor-Con M20 20 Meq Cr-Tabs (Potassium Chloride Crys Cr) .... Take One Tablet Once Daily As Needed  Allergies (verified): No Known Drug Allergies  Past History:  Past Medical History: Last updated: 09/18/2009 MYOCARDIAL INFARCTION CAD  HYPERLIPIDEMIA  RENAL INSUFFICIENCY RHABDOMYOLYSIS HYPERCHOLESTEROLEMIA  UNSPECIFIED HEART FAILURE  RESPIRATORY FAILURE  MIGRAINE HEADACHE BACTERIAL PNEUMONIA NECK PAIN, RIGHT  URI  ELEVATED BLOOD PRESSURE WITHOUT DIAGNOSIS OF HYPERTENSION  INSOMNIA-SLEEP DISORDER-UNSPEC  DEPRESSION CHOLELITHIASIS RENAL CALCULUS, HX OF  ASTHMA   Acute anterolateral wall myocardial infarction.   Bilateral staph aureus pneumonia.   Status post acute respiratory failure secondary to congestive heart failure, secondary to aspiration.   Past Surgical History: Last updated: 09/20/2006 Denies surgical history  Family History: Last updated: 06/22/2007 heart disease DM hypothyroid  Social History: Last updated: 09/13/2009 work - [employer] Former Smoker Alcohol use-yes Married no children Patient states former smoker.   Review of Systems       Denies fever, malais, weight loss, blurry vision, decreased visual acuity, cough, sputum, SOB, hemoptysis, pleuritic pain, palpitaitons, heartburn, abdominal pain, melena, lower extremity edema, claudication, or rash.   Vital Signs:  Patient profile:   35 year old male Height:      40  inches Weight:      243 pounds BMI:     32.18 Pulse rate:   75 / minute Resp:     14 per minute BP sitting:   102 / 72  (left arm)  Vitals Entered By: Kem Parkinson (April 27, 2010  9:38 AM)  Physical Exam  General:  Affect appropriate Healthy:  appears stated age HEENT: normal Neck supple with no adenopathy JVP normal no bruits no thyromegaly Lungs clear with no wheezing and good diaphragmatic motion Heart:  S1/S2 no murmur,rub, gallop or click PMI normal Abdomen: benighn, BS positve, no tenderness, no AAA no bruit.  No HSM or HJR Distal pulses intact with no bruits No edema Neuro non-focal Skin warm and dry AICD under left clavicle.  Hematoma resolved    ICD Specifications Following MD:  Sherryl Manges, MD     ICD Vendor:  [facility] Jude     ICD Model Number:  [id number]     ICD Serial Number:  [id number] ICD DOI:  11/27/2009     ICD Implanting MD:  Sherryl Manges, MD  Lead 1:    Location: RV     DOI: 11/27/2009     Model #: [id number]     Serial #: [id number]     Status: active  Indications::  ICM   ICD Follow Up ICD Dependent:  No      Episodes Coumadin:  No  Brady Parameters Mode VVI     Lower Rate Limit:  40      Tachy Zones VF:  206     Impression & Recommendations:  Problem # 1:  AUTOMATIC IMPLANTABLE CARDIAC DEFIBRILLATOR SITU (ICD-V45.02) Pocket stable.  NO D/C F/U Dr Graciela Husbands  Problem # 2:  MYOCARDIAL INFARCTION (ICD-410.90) STable no angina.  S/P stent to proximal LAD and old distal RCA occlusion with collaterals His updated medication list for this problem includes:    Aspirin 81 Mg Tbec (Aspirin) .[name]... Take one tablet by mouth daily    Effient 10 Mg Tabs (Prasugrel hcl) .[name]... Take 1 tablet by mouth once a day    Carvedilol 12.5 Mg Tabs (Carvedilol) .[name]... Take one tablet by mouth twice a day  Problem # 3:  CARDIOMYOPATHY, ISCHEMIC (ICD-414.8) Euvolemic.  Continue current meds His updated medication list for this problem includes:    Lasix 40 Mg Tabs (Furosemide) .[name]... Take one tablet once daily as needed    Aspirin 81 Mg Tbec (Aspirin) .[name]... Take one tablet by mouth daily    Effient 10 Mg Tabs (Prasugrel hcl) .[name]... Take 1 tablet by mouth once a day     Cozaar 50 Mg Tabs (Losartan potassium) .[name]... Take one tablet by mouth daily    Carvedilol 12.5 Mg Tabs (Carvedilol) .[name]... Take one tablet by mouth twice a day  Orders: TLB-BNP (B-Natriuretic Peptide) (83880-BNPR)  Problem # 4:  HYPERLIPIDEMIA (ICD-272.4) Reinforced dietary modification.  Continue crestor His updated medication list for this problem includes:    Crestor 10 Mg Tabs (Rosuvastatin calcium) .[name]... Take 1 tab by mouth at bedtime  CHOL: 224 (09/19/2006)   LDL: DEL (09/19/2006)   HDL: 32.9 (09/19/2006)   TG: 244 (09/19/2006)  Other Orders: TLB-BMP (Basic Metabolic Panel-BMET) (80048-METABOL) TLB-Hepatic/Liver Function Pnl (80076-HEPATIC)  Patient Instructions: 1)  Your physician wants you to follow-up in:6 MONTHS   You will receive a reminder letter in the mail two months in advance. If you don't receive a letter, please call our office to schedule the follow-up appointment.

## 2010-06-07 ENCOUNTER — Encounter: Payer: Self-pay | Admitting: *Deleted

## 2010-06-10 ENCOUNTER — Encounter: Payer: Self-pay | Admitting: *Deleted

## 2010-08-30 ENCOUNTER — Telehealth: Payer: Self-pay | Admitting: Cardiovascular Disease

## 2010-08-30 NOTE — Telephone Encounter (Signed)
Per pt wife calling, childcare had a big outbreak of pertussis, do you think pt should go on antibiotics to avoiding getting pertussis based on pt previous bout with with pneumonia.  Please return call and advise Please leave information on pt wife voicemail, if she does not answer.

## 2010-08-30 NOTE — Telephone Encounter (Signed)
Left message for pt, taking antibiotics will not prevent any pneumonia or pertussis Jason Hogan

## 2010-09-23 ENCOUNTER — Other Ambulatory Visit: Payer: Self-pay | Admitting: Cardiovascular Disease

## 2010-09-25 ENCOUNTER — Telehealth: Payer: Self-pay | Admitting: Pulmonary Disease

## 2010-09-25 NOTE — Telephone Encounter (Signed)
Spoke with [employer] and she states pt has had an increased in sob, chest tightness, cough x 2 weeks. Pt was advised 10/2009 visit to f/u PRN. Pt felt like he needed to be seen so he is going to come in tomorrow at 10:15 to see [facility] since he had an opening. Pt is aware of apt date and time and to brings meds

## 2010-09-26 ENCOUNTER — Ambulatory Visit (INDEPENDENT_AMBULATORY_CARE_PROVIDER_SITE_OTHER): Payer: 59 | Admitting: Pulmonary Disease

## 2010-09-26 ENCOUNTER — Encounter: Payer: Self-pay | Admitting: Pulmonary Disease

## 2010-09-26 VITALS — BP 90/60 | HR 84 | Temp 98.1°F | Ht 74.0 in | Wt 261.0 lb

## 2010-09-26 DIAGNOSIS — J45909 Unspecified asthma, uncomplicated: Secondary | ICD-10-CM

## 2010-09-26 DIAGNOSIS — R0602 Shortness of breath: Secondary | ICD-10-CM

## 2010-09-26 MED ORDER — PREDNISONE 20 MG PO TABS: ORAL_TABLET | ORAL | Status: DC

## 2010-09-26 NOTE — Progress Notes (Signed)
  Subjective:    Patient ID: Jason Hogan, male    DOB: [birth year], 35 y.o.   MRN: [id number]  HPI The pt comes in for an acute sick visit today for worsening sob.  He has a h/o mild intermittant asthma that has not required a maintenance med in the past, but also has a known ischemic CM.  He has been having worsening symptoms for one week, but denies cough, chest congestion, or mucus.  He feels a chest heaviness and some restriction to airflow.  He has had wheezing that is audible on forced exhalation only.  He has gained 40 pounds since his last visit here in 2011, and has gained 20 pounds since visit with cardiology in Feb/2012.  He did stop his diuretics about 3 days ago.     Review of Systems  Constitutional: Negative.  Negative for fever and unexpected weight change.  HENT: Positive for congestion and sneezing. Negative for ear pain, nosebleeds, sore throat, rhinorrhea, trouble swallowing, dental problem, postnasal drip and sinus pressure.   Eyes: Positive for itching. Negative for redness.  Respiratory: Positive for shortness of breath. Negative for cough, chest tightness and wheezing.   Cardiovascular: Negative.  Negative for palpitations and leg swelling.  Gastrointestinal: Negative.  Negative for nausea and vomiting.  Genitourinary: Negative.  Negative for dysuria.  Musculoskeletal: Negative.  Negative for joint swelling.  Skin: Negative for rash.  Neurological: Positive for headaches.  Hematological: Negative.  Does not bruise/bleed easily.  Psychiatric/Behavioral: Negative.  Negative for dysphoric mood. The patient is not nervous/anxious.        Objective:   Physical Exam Ow male in nad Nares patent without discharge, no purulence Op clear, no exudates Chest totally clear to auscultation, no wheezing or rhonchi Cor with rrr LE without edema, no cyanosis Alert, oriented, moves all 4        Assessment & Plan:

## 2010-09-26 NOTE — Patient Instructions (Signed)
Will start on a short course of prednisone, but would also like you to get back on your lasix once a day and watch weight. If you are not better in 48-72 hrs, would contact your cardiologist to discuss.  I will send a note to Dr. Eden Emms as well.

## 2010-09-27 ENCOUNTER — Other Ambulatory Visit: Payer: Self-pay | Admitting: *Deleted

## 2010-09-27 MED ORDER — PRASUGREL HCL 10 MG PO TABS: 10.0000 mg | ORAL_TABLET | Freq: Every day | ORAL | Status: DC

## 2010-09-27 NOTE — Telephone Encounter (Signed)
Pt's requesting refill of effient to [facility] [address], requested by pharmacy 8-5

## 2010-09-30 ENCOUNTER — Encounter: Payer: Self-pay | Admitting: Pulmonary Disease

## 2010-09-30 NOTE — Assessment & Plan Note (Signed)
The patient comes in today for worsening shortness of breath that may be related to his asthma, but I'm also concerned that it may be related to his known cardiomyopathy.  He has no significant decrease in airflow or wheezing on exam, but does not have lower extremity edema either.  He does complain of orthopnea, and his weight has significantly increased since his last physician visits.  He also has not taken his diuretic for the last 3 days.  Since there is no clear cut answer as to the etiology for his worsening symptoms, we'll go ahead and put him on a short course of prednisone.  However, I have also asked him to restart his diuretic therapy.  If he does not see improvement in his breathing with the prednisone, then obviously he needs to get back to cardiology for further evaluation.

## 2010-10-08 ENCOUNTER — Encounter: Payer: Self-pay | Admitting: Cardiovascular Disease

## 2010-10-11 ENCOUNTER — Encounter: Payer: Self-pay | Admitting: Cardiovascular Disease

## 2010-10-11 ENCOUNTER — Other Ambulatory Visit: Payer: Self-pay | Admitting: Internal Medicine

## 2010-10-11 ENCOUNTER — Encounter (INDEPENDENT_AMBULATORY_CARE_PROVIDER_SITE_OTHER): Payer: 59 | Admitting: *Deleted

## 2010-10-11 ENCOUNTER — Ambulatory Visit (INDEPENDENT_AMBULATORY_CARE_PROVIDER_SITE_OTHER): Payer: 59 | Admitting: Cardiovascular Disease

## 2010-10-11 VITALS — BP 100/80 | HR 70 | Resp 12 | Ht 74.0 in | Wt 248.0 lb

## 2010-10-11 DIAGNOSIS — I509 Heart failure, unspecified: Secondary | ICD-10-CM

## 2010-10-11 DIAGNOSIS — I2589 Other forms of chronic ischemic heart disease: Secondary | ICD-10-CM

## 2010-10-11 DIAGNOSIS — Z95810 Presence of automatic (implantable) cardiac defibrillator: Secondary | ICD-10-CM

## 2010-10-11 DIAGNOSIS — E7800 Pure hypercholesterolemia, unspecified: Secondary | ICD-10-CM

## 2010-10-11 DIAGNOSIS — G43909 Migraine, unspecified, not intractable, without status migrainosus: Secondary | ICD-10-CM

## 2010-10-11 DIAGNOSIS — I428 Other cardiomyopathies: Secondary | ICD-10-CM

## 2010-10-11 DIAGNOSIS — I219 Acute myocardial infarction, unspecified: Secondary | ICD-10-CM

## 2010-10-11 DIAGNOSIS — R030 Elevated blood-pressure reading, without diagnosis of hypertension: Secondary | ICD-10-CM

## 2010-10-11 DIAGNOSIS — R0602 Shortness of breath: Secondary | ICD-10-CM

## 2010-10-11 LAB — ICD DEVICE OBSERVATION
BRDY-0002RV: 40 {beats}/min
CHARGE TIME: 8.6 s
DEV-0020ICD: NEGATIVE
DEVICE MODEL ICD: 615318
FVT: 0
HV IMPEDENCE: 50 Ohm
PACEART VT: 0
RV LEAD AMPLITUDE: 12 mv
RV LEAD IMPEDENCE ICD: 487.5 Ohm
RV LEAD THRESHOLD: 0.75 v
TOT-0007: 1
TOT-0008: 0
TOT-0009: 0
TOT-0010: 6
VENTRICULAR PACING ICD: 0 pct
VF: 0

## 2010-10-11 LAB — BASIC METABOLIC PANEL
BUN: 18 mg/dL (ref 6–23)
CO2: 28 mEq/L (ref 19–32)
GFR: 77.85 mL/min (ref >60.00)
Glucose, Bld: 98 mg/dL (ref 70–99)
Potassium: 4.6 mEq/L (ref 3.5–5.1)
Sodium: 140 mEq/L (ref 135–145)

## 2010-10-11 LAB — BRAIN NATRIURETIC PEPTIDE: Pro B Natriuretic peptide (BNP): 114 pg/mL — ABNORMAL HIGH (ref 0.0–100.0)

## 2010-10-11 MED ORDER — METOLAZONE 5 MG PO TABS: 5.0000 mg | ORAL_TABLET | Freq: Every day | ORAL | Status: DC

## 2010-10-11 NOTE — Assessment & Plan Note (Signed)
F/U with Neale Burly at [facility].  Consider dental and chiropractic care

## 2010-10-11 NOTE — Patient Instructions (Addendum)
Your physician recommends that you schedule a follow-up appointment in: 3 months  Your physician has recommended you make the following change in your medication: Start Zaroxolyn as needed 30 minutes prior to Lasix

## 2010-10-11 NOTE — Progress Notes (Signed)
Jason Hogan is seen todya for F/U of CAD with large Anterior M.I. and collateralized silent distal RCA occlusion. Recent AICD implant with resolving hematoma. Weight is up quite a bit over last 8 monts about 30 lbs.  Discussed low carb diet and exercise program.  Having headaches again [name] at Clinic but needs reevaluation.  Also suggested chiropractic care as he has some TMJ like symptoms.   Compliant with meds. 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 With weight gain has more fatigue and some dyspnea.  Feels that Lasix is not working as well.    AICD checked today and ok.  Had [name] activate corevue to start tracking CHF data   No SSCP, palpitatons, AICD D/C or edema. Hair and scalp improved on coreg and no rash with cozaar. Did not tolerate aldactone post m.i. with excessive fatiigue.   ROS: Denies fever, malais, weight loss, blurry vision, decreased visual acuity, cough, sputum, SOB, hemoptysis, pleuritic pain, palpitaitons, heartburn, abdominal pain, melena, lower extremity edema, claudication, or rash.  All other systems reviewed and negative  General: Affect appropriate Healthy:  appears stated age overweight HEENT: normal Neck supple with no adenopathy JVP normal no bruits no thyromegaly Lungs clear with no wheezing and good diaphragmatic motion Heart:  S1/S2 no murmur,rub, gallop or click PMI normal Abdomen: benighn, BS positve, no tenderness, no AAA no bruit.  No HSM or HJR Distal pulses intact with no bruits No edema Neuro non-focal Skin warm and dry mild rash on chest actinic No muscular weakness AICD under left clavicle   Current Outpatient Prescriptions  Medication Sig Dispense Refill  . aspirin 81 MG tablet Take 81 mg by mouth daily.       . budesonide-formoterol (SYMBICORT) 80-4.5 MCG/ACT inhaler Inhale 2 puffs into the lungs 2 (two) times daily. As needed       . carvedilol (COREG) 12.5 MG tablet Twice daily      . CRESTOR 10 MG tablet Take 10 mg by  mouth daily.       . furosemide (LASIX) 40 MG tablet Take 40 mg by mouth daily.       [name] loratadine (ALAVERT) 10 MG tablet Take 10 mg by mouth daily.        [name] losartan (COZAAR) 50 MG tablet Take 50 mg by mouth daily.       . mometasone (NASONEX) 50 MCG/ACT nasal spray Place 2 sprays into the nose daily. As needed       . potassium chloride SA (K-DUR,KLOR-CON) 20 MEQ tablet Take 20 mEq by mouth daily.       . prasugrel (EFFIENT) 10 MG TABS Take 1 tablet (10 mg total) by mouth daily. Once daily  30 tablet  3  . metolazone (ZAROXOLYN) 5 MG tablet Take 1 tablet (5 mg total) by mouth daily.  30 tablet  11    Allergies  Review of patient's allergies indicates no known allergies.  Electrocardiogram:  NSR 70 Biatrial enlargement anterolateral MI stable   Assessment and Plan

## 2010-10-11 NOTE — Assessment & Plan Note (Signed)
Normal function with no D/C  Activated corvue today.

## 2010-10-11 NOTE — Assessment & Plan Note (Signed)
Euvolemic Check BNP and BMET.  Add zaroxyln PRN for weight gain and dyspnea

## 2010-10-11 NOTE — Assessment & Plan Note (Signed)
Ischemic DCM.  No angina  Samples of Effient given

## 2010-10-11 NOTE — Assessment & Plan Note (Signed)
Well controlled.  Continue current medications and low sodium Dash type diet.    

## 2010-10-11 NOTE — Assessment & Plan Note (Signed)
Continue current dose of statin and diet Rx.  No myalgias or side effects.  F/U  LFT's in 6 months. Need repeat labs when fasting.  Suspect poor control with recent weight gain

## 2010-10-15 ENCOUNTER — Telehealth: Payer: Self-pay | Admitting: Cardiovascular Disease

## 2010-10-15 NOTE — Telephone Encounter (Signed)
Returning  Call back to Briar

## 2010-10-15 NOTE — Telephone Encounter (Signed)
LMTCB ./CY 

## 2010-10-17 NOTE — Telephone Encounter (Signed)
Pt aware of lab results see lab report ./cy

## 2010-10-18 ENCOUNTER — Ambulatory Visit: Payer: 59 | Admitting: Cardiovascular Disease

## 2010-10-29 ENCOUNTER — Ambulatory Visit: Payer: PRIVATE HEALTH INSURANCE | Admitting: Cardiovascular Disease

## 2010-11-21 ENCOUNTER — Other Ambulatory Visit: Payer: Self-pay | Admitting: Cardiovascular Disease

## 2010-12-24 ENCOUNTER — Encounter: Payer: Self-pay | Admitting: Internal Medicine

## 2010-12-24 ENCOUNTER — Other Ambulatory Visit: Payer: Self-pay | Admitting: Cardiovascular Disease

## 2010-12-24 ENCOUNTER — Ambulatory Visit (INDEPENDENT_AMBULATORY_CARE_PROVIDER_SITE_OTHER): Payer: 59 | Admitting: Internal Medicine

## 2010-12-24 DIAGNOSIS — Z95810 Presence of automatic (implantable) cardiac defibrillator: Secondary | ICD-10-CM

## 2010-12-24 DIAGNOSIS — I428 Other cardiomyopathies: Secondary | ICD-10-CM

## 2010-12-24 DIAGNOSIS — I509 Heart failure, unspecified: Secondary | ICD-10-CM

## 2010-12-24 LAB — ICD DEVICE OBSERVATION
BRDY-0002RV: 40 {beats}/min
CHARGE TIME: 8.9 s
DEV-0020ICD: NEGATIVE
DEVICE MODEL ICD: 615318
RV LEAD AMPLITUDE: 12 mv
TOT-0007: 1
TOT-0008: 0
TOT-0010: 7
VENTRICULAR PACING ICD: 0 pct
VF: 0

## 2010-12-24 MED ORDER — SPIRONOLACTONE 25 MG PO TABS: ORAL_TABLET | ORAL | Status: DC

## 2010-12-24 NOTE — Progress Notes (Signed)
  HPI  Jason Hogan is a 35 y.o. 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 The procedure was compensated by hematoma.  The patient denies  chest pain, edema or palpitations;  He has some orthopnea   Lasix not working all that well   He was given RX for aldactone, but not taking  Past Medical History  Diagnosis Date  . Myocardial infarction   . CAD (coronary artery disease)   . Hyperlipidemia   . Renal insufficiency   . Respiratory failure   . Rhabdomyolysis   . Hypercholesteremia   . Migraine headache   . Bacterial pneumonia   . Heart failure   . Depression   . Insomnia   . Elevated blood pressure   . Cholelithiasis   . Asthma   . Renal calculus   . Respiratory failure   . Neck pain     right  . URI (upper respiratory infection)     No past surgical history on file.        No Known Allergies  Review of Systems negative except from HPI and PMH  Physical Exam Well developed and well nourished in no acute distress HENT normal E scleral and icterus clear Neck Supple JVP flat; carotids brisk and full Clear to ausculation Regular rate and rhythm, no murmurs gallops or rub Soft with active bowel sounds No clubbing cyanosis and edema Alert and oriented, grossly normal motor and sensory function Skin Warm and Dry    Assessment and  Plan

## 2010-12-24 NOTE — Assessment & Plan Note (Signed)
As above.

## 2010-12-24 NOTE — Assessment & Plan Note (Signed)
Will add aldactone and check bmet in 2 weeks  Reviewed side effects Stop K supplement for now and can use extra lasix prn

## 2010-12-24 NOTE — Assessment & Plan Note (Signed)
The patient's device was interrogated.  The information was reviewed. No changes were made in the programming.    

## 2010-12-24 NOTE — Patient Instructions (Signed)
Your physician has recommended you make the following change in your medication:  1) Stop potassium. 2) Start aldactone (spironalactone) 25mg  1/2 tablet by mouth once daily. 3) Take metolazone (zaroxolyn) on an as needed basis.  Your physician recommends that you return for lab work in: 2 weeks- bmet   Remote monitoring is used to monitor your Pacemaker of ICD from home. This monitoring reduces the number of office visits required to check your device to one time per year. It allows [place] to keep an eye on the functioning of your device to ensure it is working properly. You are scheduled for a device check from home on 03/28/11. You may send your transmission at any time that day. If you have a wireless device, the transmission will be sent automatically. After your physician reviews your transmission, you will receive a postcard with your next transmission date.  Your physician wants you to follow-up in: 1 year with Dr. Graciela Husbands. You will receive a reminder letter in the mail two months in advance. If you don't receive a letter, please call our office to schedule the follow-up appointment.

## 2011-01-01 ENCOUNTER — Other Ambulatory Visit: Payer: Self-pay | Admitting: Cardiovascular Disease

## 2011-01-01 DIAGNOSIS — J40 Bronchitis, not specified as acute or chronic: Secondary | ICD-10-CM

## 2011-01-01 MED ORDER — AZITHROMYCIN 250 MG PO TABS: ORAL_TABLET | ORAL | Status: DC

## 2011-01-07 ENCOUNTER — Other Ambulatory Visit (INDEPENDENT_AMBULATORY_CARE_PROVIDER_SITE_OTHER): Payer: 59 | Admitting: *Deleted

## 2011-01-07 ENCOUNTER — Ambulatory Visit: Payer: 59 | Admitting: Cardiovascular Disease

## 2011-01-07 DIAGNOSIS — I509 Heart failure, unspecified: Secondary | ICD-10-CM

## 2011-01-07 DIAGNOSIS — Z95810 Presence of automatic (implantable) cardiac defibrillator: Secondary | ICD-10-CM

## 2011-01-07 DIAGNOSIS — I428 Other cardiomyopathies: Secondary | ICD-10-CM

## 2011-01-07 LAB — BASIC METABOLIC PANEL
Chloride: 96 mEq/L (ref 96–112)
GFR: 83.64 mL/min (ref >60.00)
Glucose, Bld: 90 mg/dL (ref 70–99)
Potassium: 3.3 mEq/L — ABNORMAL LOW (ref 3.5–5.1)
Sodium: 136 mEq/L (ref 135–145)

## 2011-01-11 ENCOUNTER — Other Ambulatory Visit: Payer: Self-pay | Admitting: *Deleted

## 2011-01-11 DIAGNOSIS — E876 Hypokalemia: Secondary | ICD-10-CM

## 2011-01-18 ENCOUNTER — Other Ambulatory Visit: Payer: Self-pay | Admitting: Cardiovascular Disease

## 2011-01-18 DIAGNOSIS — I2510 Atherosclerotic heart disease of native coronary artery without angina pectoris: Secondary | ICD-10-CM

## 2011-01-18 MED ORDER — PRASUGREL HCL 10 MG PO TABS: 10.0000 mg | ORAL_TABLET | Freq: Every day | ORAL | Status: DC

## 2011-01-24 ENCOUNTER — Other Ambulatory Visit: Payer: Self-pay | Admitting: Cardiovascular Disease

## 2011-01-25 ENCOUNTER — Other Ambulatory Visit (INDEPENDENT_AMBULATORY_CARE_PROVIDER_SITE_OTHER): Payer: 59 | Admitting: *Deleted

## 2011-01-25 DIAGNOSIS — E876 Hypokalemia: Secondary | ICD-10-CM

## 2011-01-25 LAB — BASIC METABOLIC PANEL
CO2: 25 mEq/L (ref 19–32)
Chloride: 104 mEq/L (ref 96–112)
Creatinine, Ser: 1 mg/dL (ref 0.4–1.5)
Glucose, Bld: 89 mg/dL (ref 70–99)

## 2011-01-30 ENCOUNTER — Ambulatory Visit (INDEPENDENT_AMBULATORY_CARE_PROVIDER_SITE_OTHER): Payer: 59 | Admitting: Cardiovascular Disease

## 2011-01-30 ENCOUNTER — Encounter: Payer: Self-pay | Admitting: Cardiovascular Disease

## 2011-01-30 DIAGNOSIS — E7800 Pure hypercholesterolemia, unspecified: Secondary | ICD-10-CM

## 2011-01-30 DIAGNOSIS — I2589 Other forms of chronic ischemic heart disease: Secondary | ICD-10-CM

## 2011-01-30 DIAGNOSIS — G43909 Migraine, unspecified, not intractable, without status migrainosus: Secondary | ICD-10-CM

## 2011-01-30 DIAGNOSIS — Z95810 Presence of automatic (implantable) cardiac defibrillator: Secondary | ICD-10-CM

## 2011-01-30 NOTE — Progress Notes (Signed)
Jason Hogan is seen todya for F/U of CAD with large Anterior M.I. and collateralized silent distal RCA occlusion. AICD implant complicated by hematoma  Weight is up quite a bit over last 8 monts about 30 lbs. Discussed low carb diet and exercise program. Having headaches again [name] at Clinic but needs reevaluation. Also suggested chiropractic care as he has some TMJ like symptoms. Compliant with meds. 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 With weight gain has more fatigue and some dyspnea. Feels that Lasix is not working as well.  Dr Graciela Husbands gave zaroxyln which is helping but aldactone causing pruritis in hands.  Reviewed labs from 12/7 and K is good  AICD checked recently and working fine Activate CoreView  No SSCP, palpitatons, AICD D/C or edema. Hair and scalp improved off coreg and no rash with cozaar. Did not tolerate aldactone post m.i. with excessive fatiigue so I suspect he wont tolerate it now  ROS: Denies fever, malais, weight loss, blurry vision, decreased visual acuity, cough, sputum, SOB, hemoptysis, pleuritic pain, palpitaitons, heartburn, abdominal pain, melena, lower extremity edema, claudication, or rash.  All other systems reviewed and negative  General: Affect appropriate Healthy:  appears stated age HEENT: normal Neck supple with no adenopathy JVP normal no bruits no thyromegaly Lungs clear with no wheezing and good diaphragmatic motion Heart:  S1/S2 no murmur,rub, gallop or click PMI normal Abdomen: benighn, BS positve, no tenderness, no AAA no bruit.  No HSM or HJR Distal pulses intact with no bruits No edema Neuro non-focal Skin warm and dry No muscular weakness   Current Outpatient Prescriptions  Medication Sig Dispense Refill  . aspirin 81 MG tablet Take 81 mg by mouth daily.       . budesonide-formoterol (SYMBICORT) 80-4.5 MCG/ACT inhaler Inhale 2 puffs into the lungs 2 (two) times daily. As needed       . carvedilol (COREG) 12.5 MG tablet TAKE 1  TABLET BY MOUTH TWICE DAILY TAKES PLACE OF TOPROL (DISCONTINUE TOPROL)  60 tablet  0  . CRESTOR 10 MG tablet Take 10 mg by mouth daily.       . furosemide (LASIX) 40 MG tablet Take 40 mg by mouth daily.       [name] loratadine (ALAVERT) 10 MG tablet Take 10 mg by mouth as needed.       [name] losartan (COZAAR) 50 MG tablet TAKE ONE TABLET BY MOUTH EVERY DAY TAKES PLACE OF RAMIPRIL (DISCONTINUE RAMIPRIL)  30 tablet  0  . metolazone (ZAROXOLYN) 5 MG tablet Take as needed      . mometasone (NASONEX) 50 MCG/ACT nasal spray Place 2 sprays into the nose daily. As needed       . potassium chloride SA (K-DUR,KLOR-CON) 20 MEQ tablet Take 1/2 tablet by mouth once daily.      . prasugrel (EFFIENT) 10 MG TABS Take 1 tablet (10 mg total) by mouth daily. Once daily  30 tablet  3  . spironolactone (ALDACTONE) 25 MG tablet Take 1/2 tablet by mouth once daily  30 tablet  3    Allergies  Review of patient's allergies indicates no known allergies.  Electrocardiogram:  Assessment and Plan

## 2011-01-30 NOTE — Assessment & Plan Note (Signed)
Cholesterol is at goal.  Continue current dose of statin and diet Rx.  No myalgias or side effects.  F/U  LFT's in 6 months. Lab Results  Component Value Date   LDLCALC  Value: 338        Total Cholesterol/HDL:CHD Risk Coronary Heart Disease Risk Table                     Men   Women  1/2 Average Risk   3.4   3.3  Average Risk       5.0   4.4  2 X Average Risk   9.6   7.1  3 X Average Risk  23.4   11.0        Use the calculated Patient Ratio above and the CHD Risk Table to determine the patient's CHD Risk.        ATP III CLASSIFICATION (LDL):  <100     mg/dL   Optimal  100-129  mg/dL   Near or Above                    Optimal  130-159  mg/dL   Borderline  160-189  mg/dL   High  >190     mg/dL   Very High* 08/19/2009             

## 2011-01-30 NOTE — Assessment & Plan Note (Signed)
F/U headache clinic.  No signs of sinusitis  Continue beta blocker

## 2011-01-30 NOTE — Assessment & Plan Note (Signed)
No D/C  F/U with Dr Graciela Husbands  Core View activated

## 2011-01-30 NOTE — Assessment & Plan Note (Signed)
Appears euvolemic and weight up due to diet not CHF.  No SSCP  Review Core View data next visit.  Will likely need stop Aldactone He will call me in two weeks if side effects persist.

## 2011-01-30 NOTE — Patient Instructions (Signed)
Your physician recommends that you schedule a follow-up appointment in: 6 months with Dr. Nishan 

## 2011-02-07 ENCOUNTER — Encounter: Payer: Self-pay | Admitting: Internal Medicine

## 2011-02-25 ENCOUNTER — Other Ambulatory Visit: Payer: Self-pay | Admitting: Cardiovascular Disease

## 2011-03-16 ENCOUNTER — Ambulatory Visit (INDEPENDENT_AMBULATORY_CARE_PROVIDER_SITE_OTHER): Payer: 59

## 2011-03-16 DIAGNOSIS — R209 Unspecified disturbances of skin sensation: Secondary | ICD-10-CM

## 2011-03-28 ENCOUNTER — Encounter: Payer: 59 | Admitting: *Deleted

## 2011-03-28 ENCOUNTER — Ambulatory Visit (INDEPENDENT_AMBULATORY_CARE_PROVIDER_SITE_OTHER): Payer: 59 | Admitting: *Deleted

## 2011-03-28 DIAGNOSIS — I5022 Chronic systolic (congestive) heart failure: Secondary | ICD-10-CM

## 2011-03-28 DIAGNOSIS — R0989 Other specified symptoms and signs involving the circulatory and respiratory systems: Secondary | ICD-10-CM

## 2011-03-28 DIAGNOSIS — Z95810 Presence of automatic (implantable) cardiac defibrillator: Secondary | ICD-10-CM

## 2011-03-29 ENCOUNTER — Encounter: Payer: Self-pay | Admitting: Internal Medicine

## 2011-03-29 LAB — REMOTE ICD DEVICE
BRDY-0002RV: 40 {beats}/min
DEV-0020ICD: NEGATIVE
HV IMPEDENCE: 50 Ohm
RV LEAD AMPLITUDE: 12 mv
RV LEAD IMPEDENCE ICD: 480 Ohm
VENTRICULAR PACING ICD: 1 pct

## 2011-03-31 ENCOUNTER — Other Ambulatory Visit: Payer: Self-pay | Admitting: Cardiovascular Disease

## 2011-04-01 ENCOUNTER — Other Ambulatory Visit: Payer: Self-pay

## 2011-04-01 MED ORDER — ROSUVASTATIN CALCIUM 10 MG PO TABS: 10.0000 mg | ORAL_TABLET | Freq: Every day | ORAL | Status: DC

## 2011-04-01 NOTE — Telephone Encounter (Signed)
..   Requested Prescriptions   Signed Prescriptions Disp Refills  . rosuvastatin (CRESTOR) 10 MG tablet 30 tablet 4    Sig: Take 1 tablet (10 mg total) by mouth daily.    Authorizing Provider: Wendall Stade    Ordering User: Christella Hartigan, Jerome Otter Judie Petit

## 2011-04-04 ENCOUNTER — Telehealth: Payer: Self-pay | Admitting: *Deleted

## 2011-04-04 ENCOUNTER — Telehealth: Payer: Self-pay | Admitting: Cardiovascular Disease

## 2011-04-04 ENCOUNTER — Encounter: Payer: Self-pay | Admitting: *Deleted

## 2011-04-04 NOTE — Telephone Encounter (Signed)
Left message for pts wife, they had walked into the office yesterday asking for samples of effient. Made aware samples placed at the front desk for pick up.

## 2011-04-04 NOTE — Telephone Encounter (Signed)
Walk In pt Form" pt needs to know if she can pick up samples of Medication" sent to Message Nurse ( I Just Received this @ 5pm 04/03/11)KM

## 2011-04-05 NOTE — Progress Notes (Signed)
ICD remote 

## 2011-04-09 ENCOUNTER — Encounter: Payer: Self-pay | Admitting: *Deleted

## 2011-05-03 ENCOUNTER — Encounter: Payer: Self-pay | Admitting: Internal Medicine

## 2011-06-10 IMAGING — CR DG CHEST 1V PORT
1 series · 1 of 1 positions shown · non-contrast
Comparison: Earlier film of the same day

CLINICAL DATA: Myocardial infarction, central line placement

PORTABLE CHEST - 1 VIEW

[view not recorded]
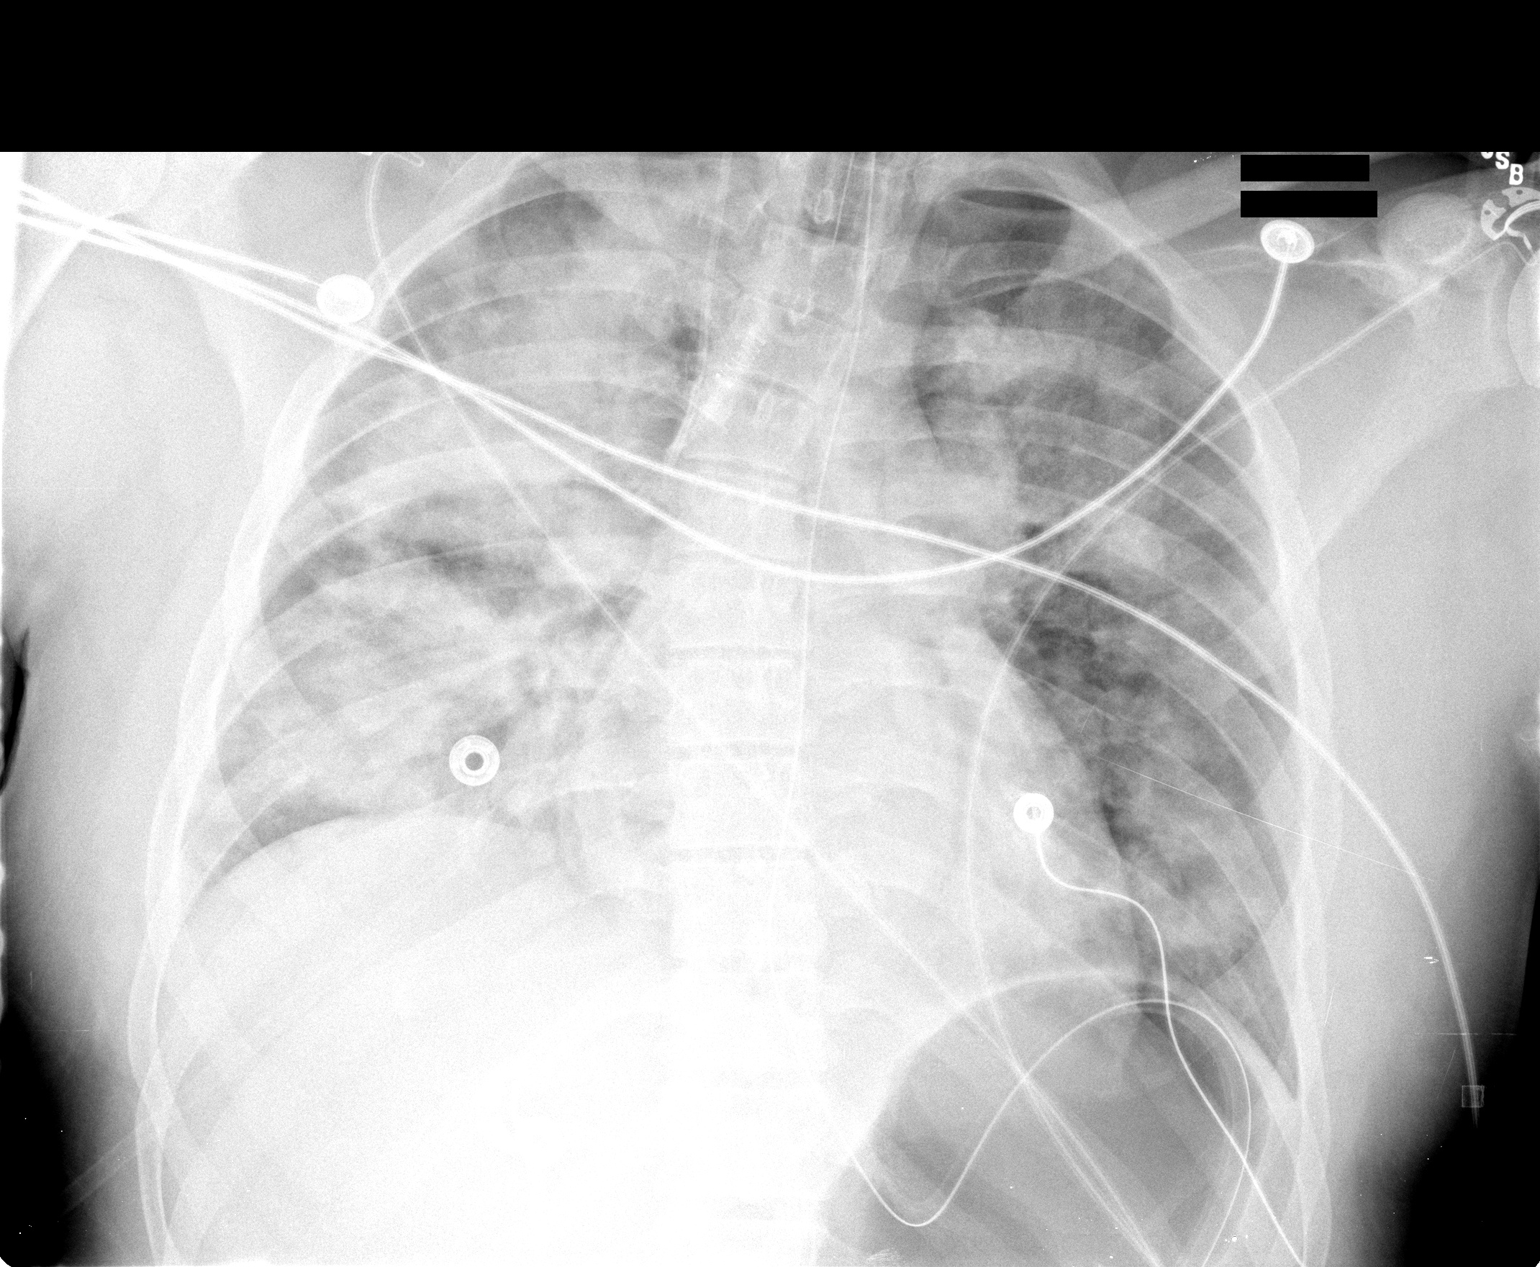

[1 of 1 positions shown; findings below may reference images not displayed]

FINDINGS: Endotracheal tube and nasogastric tube stable.  Right IJ
central line has been placed to the mid SVC.  No pneumothorax.
Moderate airspace opacities throughout both lungs with peripheral
sparing, slightly improved on the left.  No effusion.  Heart size
normal.
IMPRESSION: 1.  Central line to mid SVC without pneumothorax.
2.  Slight improvement in moderate bilateral airspace edema or
infiltrates, right worse than left.

## 2011-06-12 IMAGING — CR DG CHEST 1V PORT
1 series · 1 of 1 positions shown · non-contrast
Comparison: the previous day's study

CLINICAL DATA: Edema

PORTABLE CHEST - 1 VIEW

[view not recorded]
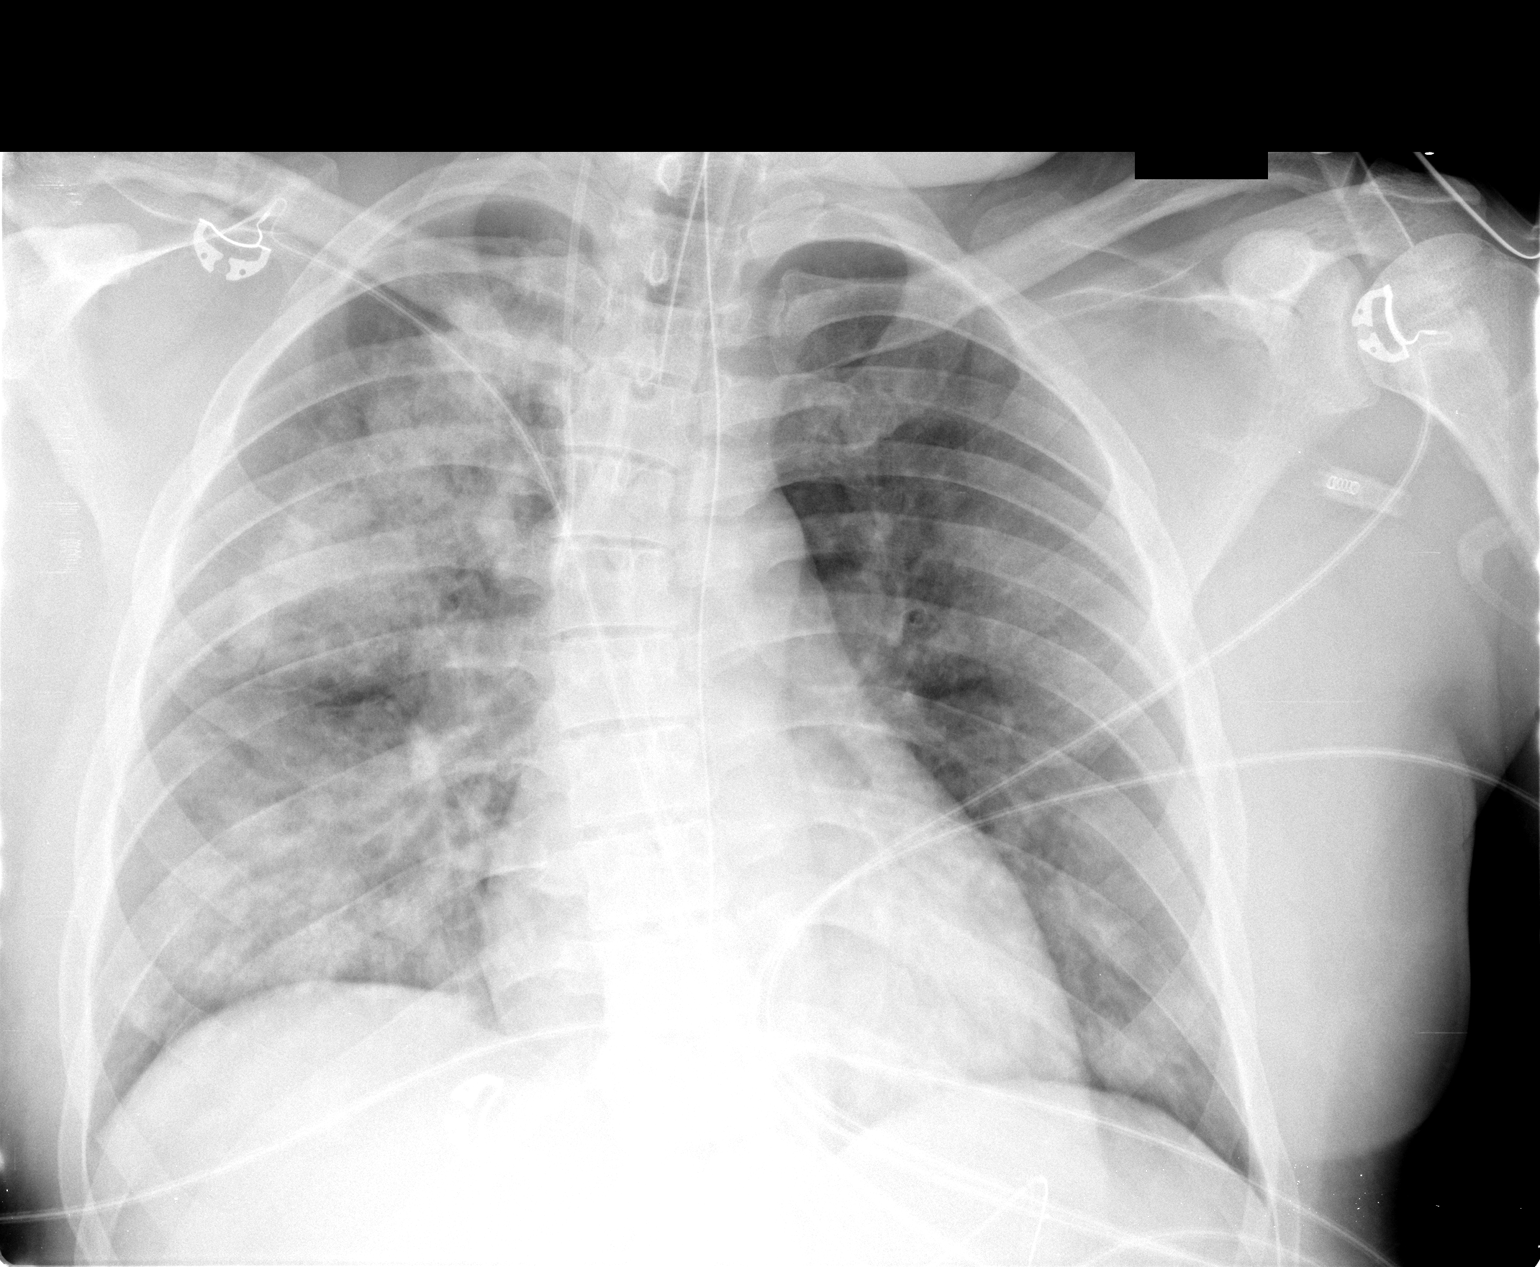

[1 of 1 positions shown; findings below may reference images not displayed]

FINDINGS: Endotracheal tube, nasogastric tube, and right IJ central
line are stable in position.  Patchy airspace opacities
bilaterally, right greater than left, with peripheral sparing,
decreased since prior exam.  No effusion.  Heart size normal.
IMPRESSION: 1.  Interval decrease in asymmetric airspace edema or infiltrates.
2. Support hardware stable in position.

## 2011-06-13 IMAGING — CR DG CHEST 1V PORT
1 series · 1 of 1 positions shown · non-contrast
Comparison: 08/21/2009

CLINICAL DATA: Intubated, ST elevation myocardial infarction

PORTABLE CHEST - 1 VIEW

[AP]
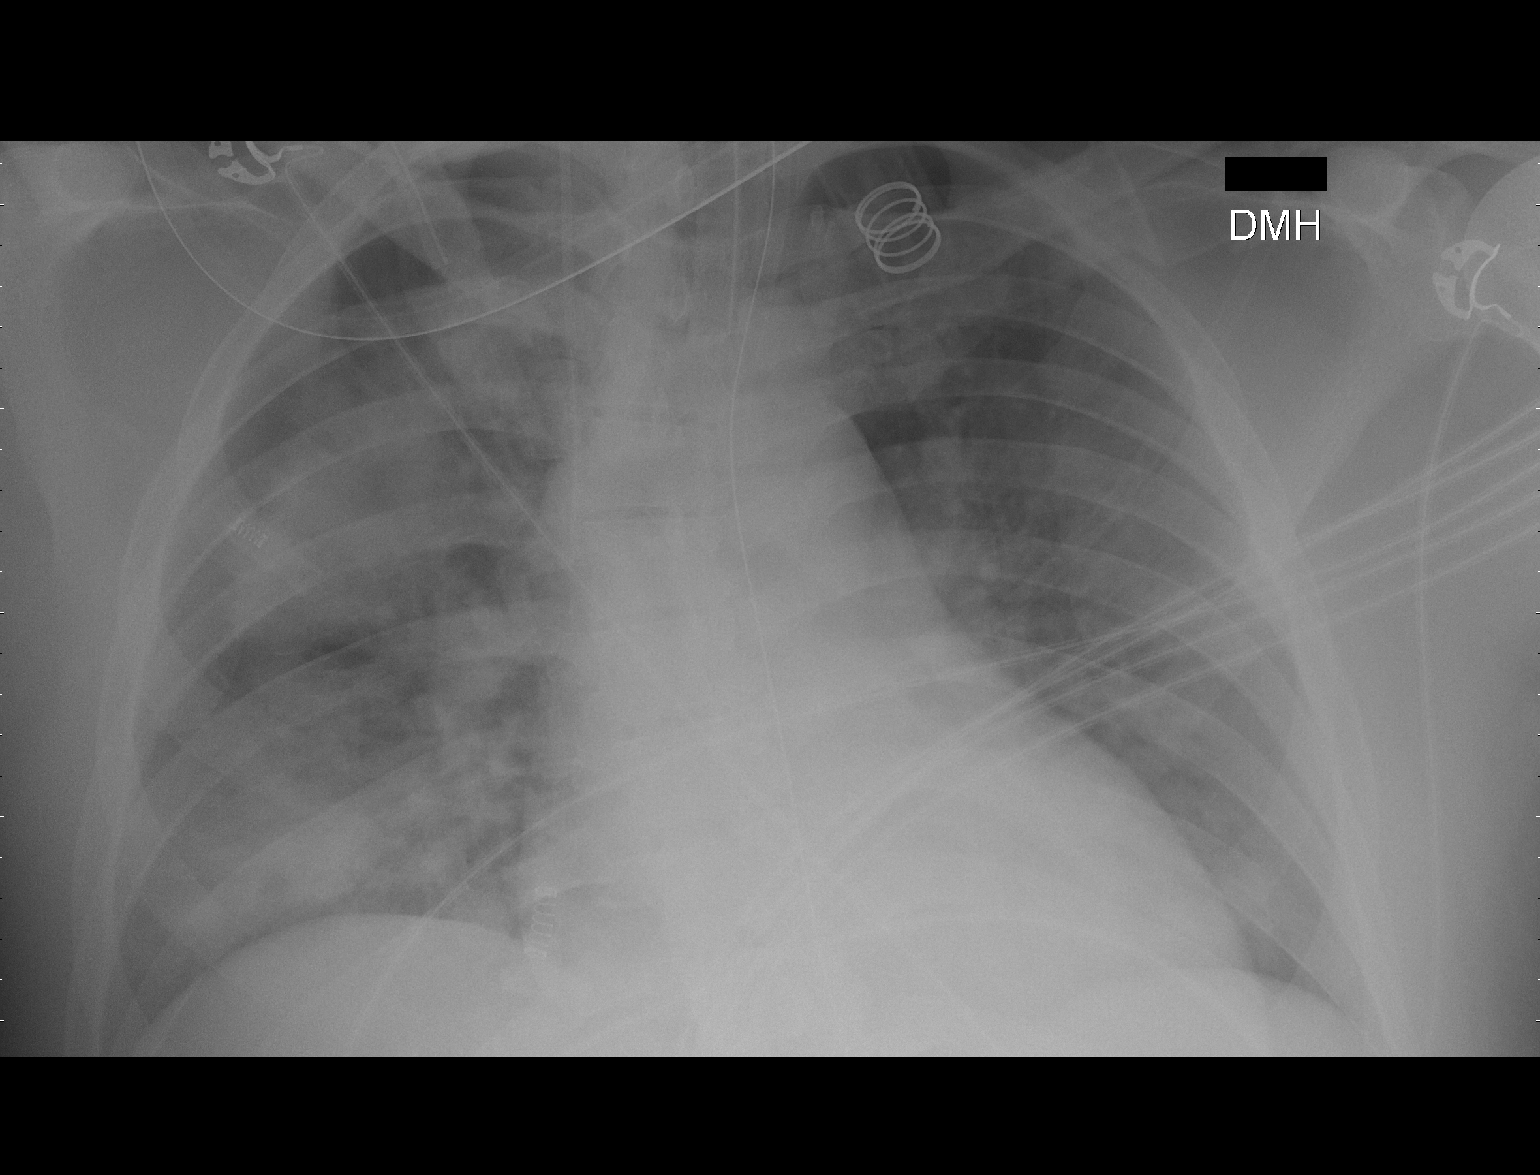

[1 of 1 positions shown; findings below may reference images not displayed]

FINDINGS: Endotracheal tube appropriately positioned.  Entered NG
tube right IJ central line tip over distal SVC.  Patchy perihilar
airspace opacities persist.  No significant pleural effusion.
Moderate enlargement of the cardiomediastinal silhouette may be in
part technical.
IMPRESSION: Stable patchy airspace opacities, likely edema, less likely
asymmetric pneumonia.

## 2011-06-14 IMAGING — CR DG CHEST 1V PORT
1 series · 1 of 1 positions shown · non-contrast
Comparison: 7277 hours

CLINICAL DATA: Code study/tube placement

PORTABLE CHEST - 1 VIEW

[view not recorded]
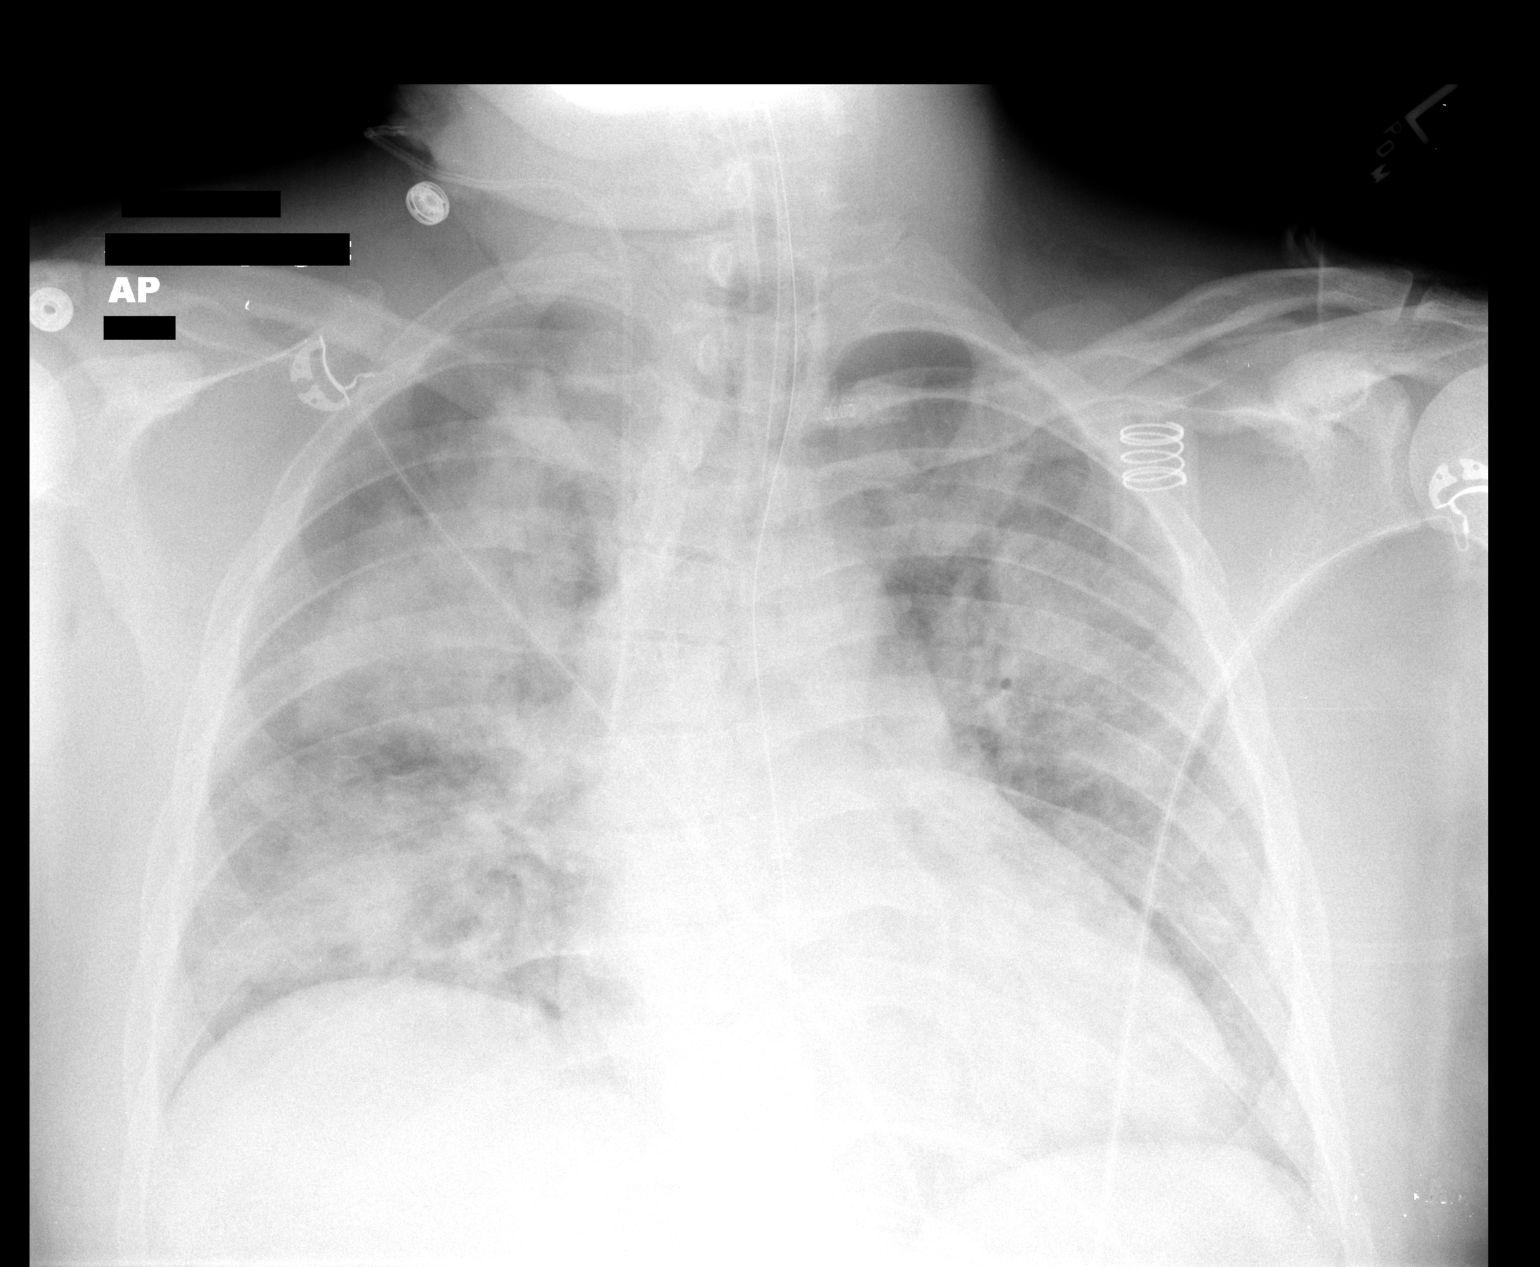

[1 of 1 positions shown; findings below may reference images not displayed]

FINDINGS: Bilateral airspace densities shows slight interval
worsening.  There is relative sparing of the lung periphery and so
pulmonary edema is most likely diagnosis.  Support apparatus
appears stable with ET tube, right jugular central line, and NG
tube unchanged.  No obvious new tubes or lines.  No pneumothorax.
IMPRESSION: 1. Interval worsening of bilateral airspace disease thought to
represent pulmonary edema.
2.  Support apparatus stable.

## 2011-06-15 IMAGING — CR DG CHEST 1V PORT
1 series · 1 of 1 positions shown · non-contrast
Comparison: 1 day prior

CLINICAL DATA: CHF.

PORTABLE CHEST - 1 VIEW

[AP]
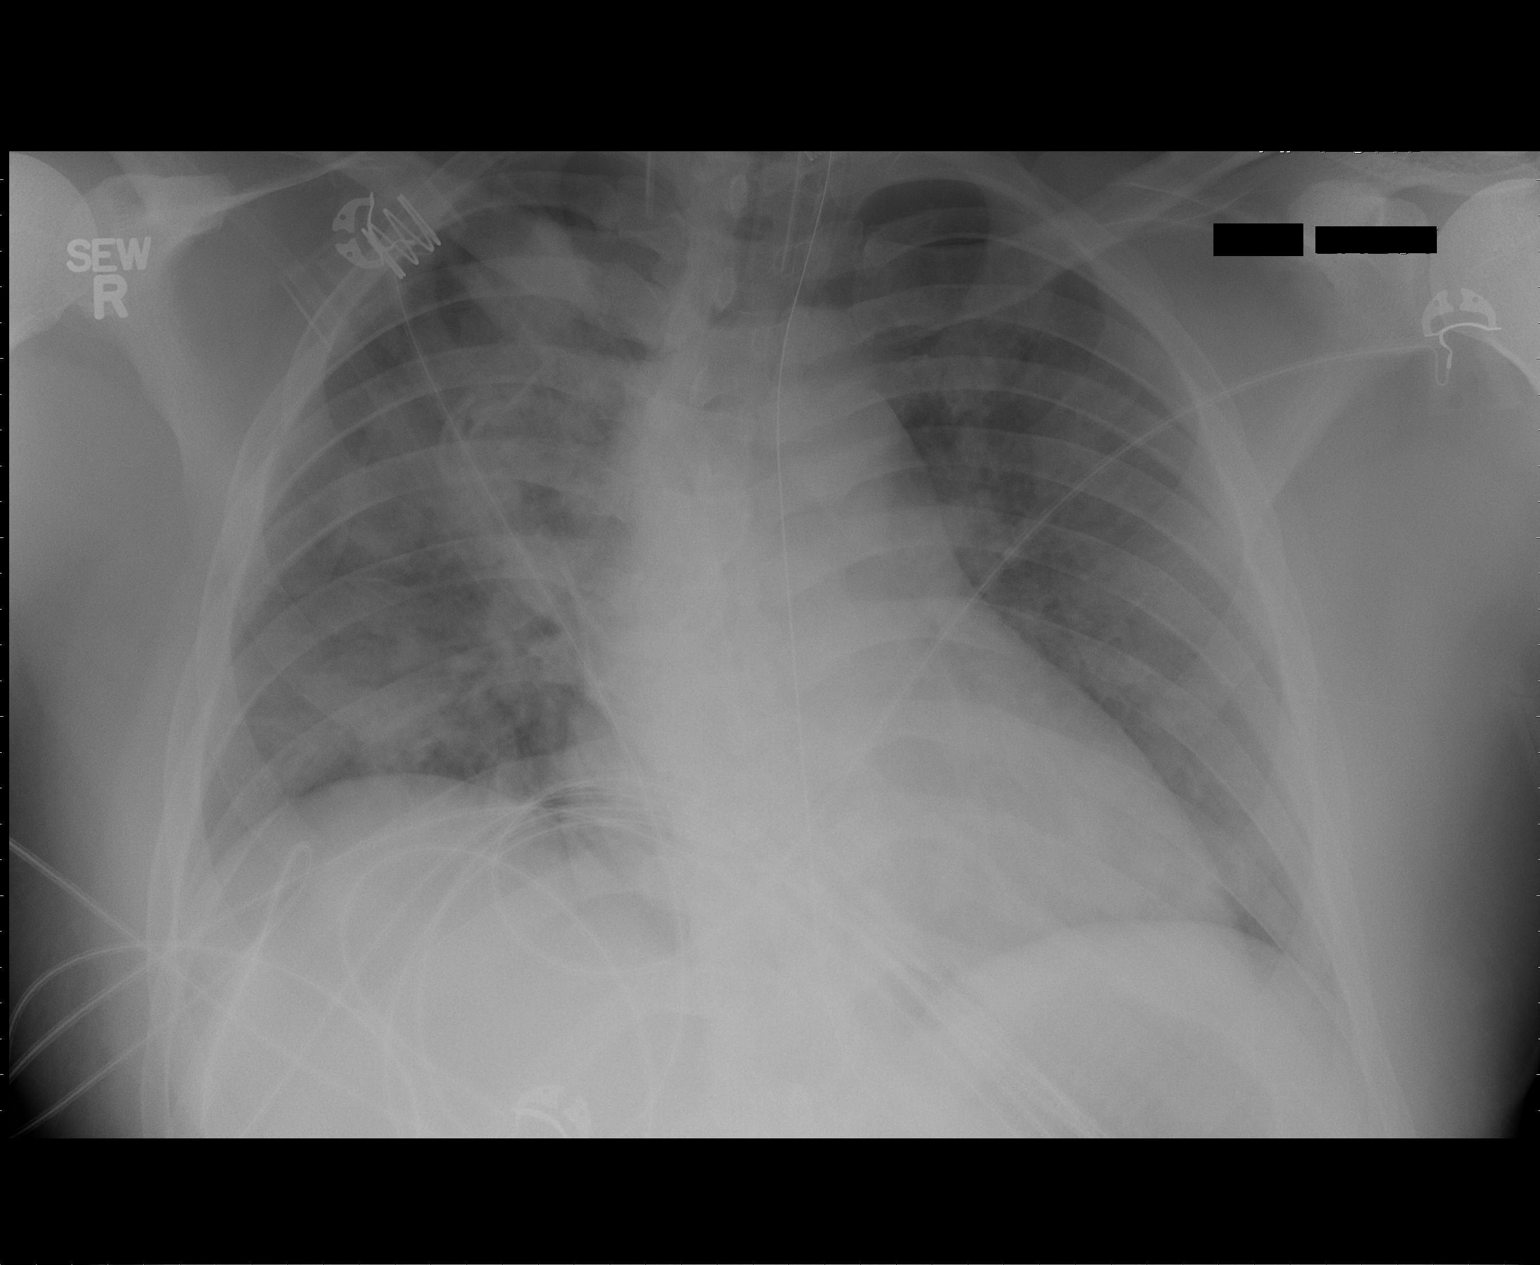

[1 of 1 positions shown; findings below may reference images not displayed]

FINDINGS: Endotracheal tube terminates at the level of the
clavicles.  Nasogastric tube extends beyond the  inferior aspect of
the film.  Right IJ central line has been repositioned or replaced.
Currently terminates over the low right jugular vein.

Mild cardiomegaly.  Slight improvement in aeration.  Right greater
than left airspace disease remains. No pleural effusion or
pneumothorax.
IMPRESSION: 1.  Improved bilateral airspace disease, likely pulmonary edema.
2.  Repositioning or replacement of right IJ central line.  This
currently terminates over the right internal jugular vein.
Consider repositioning. This report was called to nurse, Meziane at
[DATE] by my Ye, Nary.

## 2011-06-24 ENCOUNTER — Telehealth: Payer: Self-pay | Admitting: *Deleted

## 2011-06-24 NOTE — Telephone Encounter (Signed)
LEFT MESSAGE TO CALL  BACK RE IF HAS TRIED ANY OTHER CHOLESTEROL MEDS BESIDES CRESTOR PER INS./CY

## 2011-06-26 NOTE — Telephone Encounter (Signed)
LMTCB ./CY 

## 2011-06-27 ENCOUNTER — Encounter: Payer: Self-pay | Admitting: Internal Medicine

## 2011-06-27 ENCOUNTER — Ambulatory Visit (INDEPENDENT_AMBULATORY_CARE_PROVIDER_SITE_OTHER): Payer: Self-pay | Admitting: *Deleted

## 2011-06-27 DIAGNOSIS — I5022 Chronic systolic (congestive) heart failure: Secondary | ICD-10-CM

## 2011-06-27 DIAGNOSIS — I428 Other cardiomyopathies: Secondary | ICD-10-CM

## 2011-06-27 LAB — REMOTE ICD DEVICE
DEV-0020ICD: NEGATIVE
HV IMPEDENCE: 54 Ohm
RV LEAD IMPEDENCE ICD: 530 Ohm

## 2011-06-28 NOTE — Telephone Encounter (Signed)
Fu call °Pt returning your call  °

## 2011-06-28 NOTE — Telephone Encounter (Signed)
Follow-up:    Patient returned your call.  Please call back. 

## 2011-06-28 NOTE — Telephone Encounter (Signed)
SPOKE WITH PT   HAS TRIED  ANOTHER MED IN PAST AND DID NOT TOLERATE   WILL FORWARD   FAX REQUEST  ON  MON TO GET CRESTOR 10 MG APPPROVED IN  MEANTIME SAMPLES LEFT AT FRONT DEST FOR PICK UP .Zack Seal

## 2011-07-02 NOTE — Telephone Encounter (Signed)
LEFT MESSAGE THAT  CRESTOR HAS BEEN APPROVED AND SHOULD BE AVAILABLE AT PHARMACY FOR PICK  MAY CALL IF HAS ANY ADDITIONAL QUESTIONS./CY

## 2011-07-05 ENCOUNTER — Encounter: Payer: Self-pay | Admitting: *Deleted

## 2011-07-12 NOTE — Progress Notes (Signed)
ICD remote with ICM 

## 2011-07-21 ENCOUNTER — Other Ambulatory Visit: Payer: Self-pay | Admitting: Cardiovascular Disease

## 2011-10-02 ENCOUNTER — Encounter: Payer: Self-pay | Admitting: Internal Medicine

## 2011-10-02 ENCOUNTER — Ambulatory Visit (INDEPENDENT_AMBULATORY_CARE_PROVIDER_SITE_OTHER): Payer: PRIVATE HEALTH INSURANCE | Admitting: Internal Medicine

## 2011-10-02 ENCOUNTER — Other Ambulatory Visit (INDEPENDENT_AMBULATORY_CARE_PROVIDER_SITE_OTHER): Payer: PRIVATE HEALTH INSURANCE

## 2011-10-02 VITALS — BP 100/70 | HR 81 | Temp 98.2°F | Ht 74.0 in | Wt 252.2 lb

## 2011-10-02 DIAGNOSIS — J309 Allergic rhinitis, unspecified: Secondary | ICD-10-CM

## 2011-10-02 DIAGNOSIS — G4700 Insomnia, unspecified: Secondary | ICD-10-CM

## 2011-10-02 DIAGNOSIS — Z0000 Encounter for general adult medical examination without abnormal findings: Secondary | ICD-10-CM

## 2011-10-02 DIAGNOSIS — F411 Generalized anxiety disorder: Secondary | ICD-10-CM

## 2011-10-02 DIAGNOSIS — J40 Bronchitis, not specified as acute or chronic: Secondary | ICD-10-CM

## 2011-10-02 DIAGNOSIS — F419 Anxiety disorder, unspecified: Secondary | ICD-10-CM

## 2011-10-02 DIAGNOSIS — J45909 Unspecified asthma, uncomplicated: Secondary | ICD-10-CM

## 2011-10-02 DIAGNOSIS — J019 Acute sinusitis, unspecified: Secondary | ICD-10-CM

## 2011-10-02 LAB — URINALYSIS, ROUTINE W REFLEX MICROSCOPIC
Bilirubin Urine: NEGATIVE
Ketones, ur: NEGATIVE
Nitrite: NEGATIVE
Total Protein, Urine: NEGATIVE
pH: 6 (ref 5.0–8.0)

## 2011-10-02 LAB — CBC WITH DIFFERENTIAL/PLATELET
Basophils Relative: 0.4 % (ref 0.0–3.0)
Eosinophils Relative: 3.9 % (ref 0.0–5.0)
HCT: 44.9 % (ref 39.0–52.0)
MCV: 85.9 fl (ref 78.0–100.0)
Monocytes Absolute: 0.8 10*3/uL (ref 0.1–1.0)
Monocytes Relative: 8.5 % (ref 3.0–12.0)
Neutrophils Relative %: 57.6 % (ref 43.0–77.0)
RBC: 5.23 Mil/uL (ref 4.22–5.81)
WBC: 9.3 10*3/uL (ref 4.5–10.5)

## 2011-10-02 MED ORDER — TEMAZEPAM 30 MG PO CAPS: 30.0000 mg | ORAL_CAPSULE | Freq: Every evening | ORAL | Status: DC | PRN

## 2011-10-02 MED ORDER — TIZANIDINE HCL 4 MG PO TABS: 4.0000 mg | ORAL_TABLET | Freq: Four times a day (QID) | ORAL | Status: AC | PRN

## 2011-10-02 MED ORDER — BUDESONIDE-FORMOTEROL FUMARATE 80-4.5 MCG/ACT IN AERO: 2.0000 | INHALATION_SPRAY | Freq: Two times a day (BID) | RESPIRATORY_TRACT | Status: DC

## 2011-10-02 MED ORDER — AZITHROMYCIN 250 MG PO TABS: ORAL_TABLET | ORAL | Status: DC

## 2011-10-02 MED ORDER — MOMETASONE FUROATE 50 MCG/ACT NA SUSP: 2.0000 | Freq: Every day | NASAL | Status: DC

## 2011-10-02 NOTE — Patient Instructions (Addendum)
Take all new medications as prescribed - the temazepam for sleep, and tizanidine for muscle, and the antibiotic Continue all other medications as before, including the symbicort and nasonex Please have the pharmacy call with any refills you may need. Please keep your appointments with your specialists as you have planned - cardiology and allergy Please go to LAB in the Basement for the blood and/or urine tests to be done today You will be contacted by phone if any changes need to be made immediately.  Otherwise, you will receive a letter about your results with an explanation. Please return in 1 year for your yearly visit, or sooner if needed, with Lab testing done 3-5 days before

## 2011-10-02 NOTE — Progress Notes (Signed)
Subjective:    Patient ID: Jason Hogan, male    DOB: [birth year], 36 y.o.   MRN: [id number]  HPI  Here for wellness and f/u;  Overall doing ok;  Pt denies CP, worsening SOB, DOE, wheezing, orthopnea, PND, worsening LE edema, palpitations, dizziness or syncope.  Pt denies neurological change such as new Headache, facial or extremity weakness.  Pt denies polydipsia, polyuria, or low sugar symptoms. Pt states overall good compliance with treatment and medications, good tolerability, and trying to follow lower cholesterol diet.  Pt denies worsening depressive symptoms, suicidal ideation or panic. No fever, wt loss, night sweats, loss of appetite, or other constitutional symptoms.  Pt states good ability with ADL's, low fall risk, home safety reviewed and adequate, no significant changes in hearing or vision, and occasionally active with exercise.   Has not seen allergy recently, run out of symbicort and nasonex to which he did very well previously, has mild recurrent symtpoms, needs bridging med, plans to see again soon, also with mild right sinus pain and blood d/c and recurring irritated area, overall loss of smell in last few days as well.  Denies worsening depressive symptoms, suicidal ideation, or panic, though has ongoing anxiety, with some increase recently, not asking for specific tx except for sleep at night only occuring about 4 hrs as cant get to sleep, and ambien caused memory issue next day.  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  Cont's to follow closely with cardiology as well. Past Medical History  Diagnosis Date  . Myocardial infarction   . CAD (coronary artery disease)   . Hyperlipidemia   . Renal insufficiency   . Respiratory failure   . Rhabdomyolysis   . Hypercholesteremia   . Migraine headache   . Bacterial pneumonia   . Heart failure   . Depression   . Insomnia   . Elevated blood pressure   . Cholelithiasis   . Asthma   . Renal calculus   .  Respiratory failure   . Neck pain     right  . URI (upper respiratory infection)    No past surgical history on file.  reports that he quit smoking about 17 years ago. He has never used smokeless tobacco. He reports that he drinks alcohol. He reports that he does not use illicit drugs. family history includes Diabetes in an unspecified family member; Heart disease in an unspecified family member; and Hypothyroidism in an unspecified family member. No Known Allergies Current Outpatient Prescriptions on File Prior to Visit  Medication Sig Dispense Refill  . aspirin 81 MG tablet Take 81 mg by mouth daily.       . budesonide-formoterol (SYMBICORT) 80-4.5 MCG/ACT inhaler Inhale 2 puffs into the lungs 2 (two) times daily. As needed  1 Inhaler  2  . carvedilol (COREG) 12.5 MG tablet TAKE 1 TABLET BY MOUTH TWICE DAILY TAKES PLACE OF TOPROL (DISCONTINUE TOPROL)  60 tablet  11  . EFFIENT 10 MG TABS TAKE 1 TABLET BY MOUTH ONCE DAILY  30 tablet  2  . furosemide (LASIX) 40 MG tablet Take 40 mg by mouth daily.       [name] loratadine (ALAVERT) 10 MG tablet Take 10 mg by mouth as needed.       [name] losartan (COZAAR) 50 MG tablet TAKE ONE TABLET BY MOUTH EVERY DAY TAKES PLACE OF RAMIPRIL (DISCONTINUE RAMIPRIL)  30 tablet  11  . metolazone (ZAROXOLYN) 5 MG tablet Take as needed      .  mometasone (NASONEX) 50 MCG/ACT nasal spray Place 2 sprays into the nose daily. As needed  17 g  5  . potassium chloride SA (K-DUR,KLOR-CON) 20 MEQ tablet Take 1/2 tablet by mouth once daily.      . rosuvastatin (CRESTOR) 10 MG tablet Take 1 tablet (10 mg total) by mouth daily.  30 tablet  4  . spironolactone (ALDACTONE) 25 MG tablet Take 1/2 tablet by mouth once daily  30 tablet  3  . temazepam (RESTORIL) 30 MG capsule Take 1 capsule (30 mg total) by mouth at bedtime as needed for sleep.  30 capsule  2   Review of Systems Review of Systems  Constitutional: Negative for diaphoresis, activity change, appetite change and unexpected  weight change.  HENT: Negative for hearing loss, ear pain, facial swelling, mouth sores and neck stiffness.   Eyes: Negative for pain, redness and visual disturbance.  Respiratory: Negative for shortness of breath and wheezing.   Cardiovascular: Negative for chest pain and palpitations.  Gastrointestinal: Negative for diarrhea, blood in stool, abdominal distention and rectal pain.  Genitourinary: Negative for hematuria, flank pain and decreased urine volume.  Musculoskeletal: Negative for myalgias and joint swelling.  Skin: Negative for color change and wound.  Neurological: Negative for syncope and numbness.  Hematological: Negative for adenopathy.  Psychiatric/Behavioral: Negative for hallucinations, self-injury, decreased concentration and agitation.      Objective:   Physical Exam BP 100/70  Pulse 81  Temp 98.2 F (36.8 C) (Oral)  Ht 6\' 2"  (1.88 m)  Wt 252 lb 4 oz (114.42 kg)  BMI 32.39 kg/m2  SpO2 95% Physical Exam  VS noted Constitutional: Pt is oriented to person, place, and time. Appears well-developed and well-nourished.  HENT:  Head: Normocephalic and atraumatic.  Right Ear: External ear normal.  Left Ear: External ear normal.  Nose: Nose normal.  Mouth/Throat: Oropharynx is clear and moist.  Bilat tm's mild erythema.  Sinus right maxillary tender.  Pharynx mild erythema Eyes: Conjunctivae and EOM are normal. Pupils are equal, round, and reactive to light.  Neck: Normal range of motion. Neck supple. No JVD present. No tracheal deviation present.  Cardiovascular: Normal rate, regular rhythm, normal heart sounds and intact distal pulses.   Pulmonary/Chest: Effort normal and breath sounds mild decreased, trace wheezing  Abdominal: Soft. Bowel sounds are normal. There is no tenderness.  Musculoskeletal: Normal range of motion. Exhibits no edema.  Lymphadenopathy:  Has no cervical adenopathy.  Neurological: Pt is alert and oriented to person, place, and time. Pt has  normal reflexes. No cranial nerve deficit. Motor/sens/dtr intact Skin: Skin is warm and dry. No rash noted.  Psychiatric: 1-2+ nervous, not depressed affect,  Behavior is normal.     Assessment & Plan:

## 2011-10-03 ENCOUNTER — Telehealth: Payer: Self-pay

## 2011-10-03 ENCOUNTER — Encounter: Payer: Self-pay | Admitting: Internal Medicine

## 2011-10-03 ENCOUNTER — Ambulatory Visit (INDEPENDENT_AMBULATORY_CARE_PROVIDER_SITE_OTHER): Payer: PRIVATE HEALTH INSURANCE | Admitting: *Deleted

## 2011-10-03 DIAGNOSIS — Z95810 Presence of automatic (implantable) cardiac defibrillator: Secondary | ICD-10-CM

## 2011-10-03 DIAGNOSIS — I5022 Chronic systolic (congestive) heart failure: Secondary | ICD-10-CM

## 2011-10-03 DIAGNOSIS — J309 Allergic rhinitis, unspecified: Secondary | ICD-10-CM

## 2011-10-03 DIAGNOSIS — F419 Anxiety disorder, unspecified: Secondary | ICD-10-CM | POA: Insufficient documentation

## 2011-10-03 DIAGNOSIS — J019 Acute sinusitis, unspecified: Secondary | ICD-10-CM | POA: Insufficient documentation

## 2011-10-03 HISTORY — DX: Allergic rhinitis, unspecified: J30.9

## 2011-10-03 HISTORY — DX: Anxiety disorder, unspecified: F41.9

## 2011-10-03 LAB — LIPID PANEL
Total CHOL/HDL Ratio: 4
Triglycerides: 268 mg/dL — ABNORMAL HIGH (ref 0.0–149.0)

## 2011-10-03 LAB — BASIC METABOLIC PANEL
CO2: 30 mEq/L (ref 19–32)
Calcium: 9.7 mg/dL (ref 8.4–10.5)
Chloride: 102 mEq/L (ref 96–112)
Glucose, Bld: 88 mg/dL (ref 70–99)
Potassium: 3.7 mEq/L (ref 3.5–5.1)
Sodium: 141 mEq/L (ref 135–145)

## 2011-10-03 LAB — HEPATIC FUNCTION PANEL
ALT: 26 U/L (ref 0–53)
AST: 27 U/L (ref 0–37)
Bilirubin, Direct: 0.1 mg/dL (ref 0.0–0.3)
Total Protein: 7.8 g/dL (ref 6.0–8.3)

## 2011-10-03 LAB — TSH: TSH: 1.63 u[IU]/mL (ref 0.35–5.50)

## 2011-10-03 MED ORDER — TRIAMCINOLONE ACETONIDE(NASAL) 55 MCG/ACT NA INHA: 2.0000 | Freq: Every day | NASAL | Status: DC

## 2011-10-03 NOTE — Telephone Encounter (Signed)
Ok to try nasacort AQ, but if not approved, may need PA b/c of recent nose bleed possibly worse if tries generic flonase

## 2011-10-03 NOTE — Assessment & Plan Note (Signed)
Ok to stop [place] which he did not tolerate, ok for temazepam asd,  to f/u any worsening symptoms or concerns

## 2011-10-03 NOTE — Assessment & Plan Note (Signed)
Mild to mod, for nasonex restart, f/u nasonex,  to f/u any worsening symptoms or concerns

## 2011-10-03 NOTE — Assessment & Plan Note (Signed)

## 2011-10-03 NOTE — Telephone Encounter (Signed)
nasonex is not covered under plan and needs PA advise

## 2011-10-03 NOTE — Assessment & Plan Note (Signed)
decleins other specific tx at this time, such as SSRI or counseling

## 2011-10-03 NOTE — Assessment & Plan Note (Signed)
Mild to mod, for antibx course,  to f/u any worsening symptoms or concerns 

## 2011-10-03 NOTE — Assessment & Plan Note (Signed)
Mild persistent uncontrolled, for symbicort restart, f/u allergy as he has planned to f/u any worsening symptoms or concerns

## 2011-10-07 ENCOUNTER — Ambulatory Visit (INDEPENDENT_AMBULATORY_CARE_PROVIDER_SITE_OTHER): Payer: PRIVATE HEALTH INSURANCE | Admitting: *Deleted

## 2011-10-07 ENCOUNTER — Encounter: Payer: Self-pay | Admitting: Internal Medicine

## 2011-10-07 ENCOUNTER — Encounter: Payer: Self-pay | Admitting: Cardiovascular Disease

## 2011-10-07 ENCOUNTER — Ambulatory Visit (INDEPENDENT_AMBULATORY_CARE_PROVIDER_SITE_OTHER): Payer: PRIVATE HEALTH INSURANCE | Admitting: Cardiovascular Disease

## 2011-10-07 ENCOUNTER — Encounter: Payer: Self-pay | Admitting: *Deleted

## 2011-10-07 VITALS — BP 104/72 | HR 74 | Ht 74.0 in | Wt 246.0 lb

## 2011-10-07 DIAGNOSIS — R002 Palpitations: Secondary | ICD-10-CM

## 2011-10-07 DIAGNOSIS — R0989 Other specified symptoms and signs involving the circulatory and respiratory systems: Secondary | ICD-10-CM

## 2011-10-07 DIAGNOSIS — I509 Heart failure, unspecified: Secondary | ICD-10-CM

## 2011-10-07 LAB — REMOTE ICD DEVICE
DEVICE MODEL ICD: 615318
RV LEAD AMPLITUDE: 11.9 mv

## 2011-10-07 LAB — ICD DEVICE OBSERVATION
CHARGE TIME: 9.1 s
DEV-0020ICD: NEGATIVE
RV LEAD IMPEDENCE ICD: 450 Ohm

## 2011-10-07 NOTE — Progress Notes (Signed)
defib interrogation only---seeing nishan.

## 2011-10-07 NOTE — Assessment & Plan Note (Signed)
No D/C will interogate today to see if palpitations represent anything.  CoreView values good with no signs of CHF

## 2011-10-07 NOTE — Progress Notes (Signed)
Patient ID: Jason Hogan, male   DOB: [birth year], 36 y.o.   MRN: [id number] Jason Hogan is seen todya for F/U of CAD with large Anterior M.I. and collateralized silent distal RCA occlusion. AICD implant complicated by hematoma Weight is up quite a bit over last 8 monts about 30 lbs. Discussed low carb diet and exercise program. Having headaches again [facility] at Clinic but needs reevaluation. Also suggested chiropractic care as he has some TMJ like symptoms. Compliant with meds. 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 With weight gain has more fatigue and some dyspnea. On chronic diuretics.  Pruritis in hands when on aldactone in past   AICD checked recently 5/13  and working fine Coreview normal with no signs of CHF Having palpitations and ? PVC;s   Has single chamber device with no counter so unable to assess palpitations  Recent visit to urology at [facility] working on having another child  No SSCP, palpitatons, AICD D/C or edema. Hair and scalp improved off coreg and no rash with cozaar. Did not tolerate aldactone post m.i. with excessive fatiigue so I suspect he wont tolerate it now  ROS: Denies fever, malais, weight loss, blurry vision, decreased visual acuity, cough, sputum, SOB, hemoptysis, pleuritic pain, palpitaitons, heartburn, abdominal pain, melena, lower extremity edema, claudication, or rash.  All other systems reviewed and negative  General: Affect appropriate Healthy:  appears stated age HEENT: normal Neck supple with no adenopathy JVP normal no bruits no thyromegaly Lungs clear with no wheezing and good diaphragmatic motion Heart:  S1/S2 no murmur, no rub, gallop or click PMI normal Abdomen: benighn, BS positve, no tenderness, no AAA no bruit.  No HSM or HJR Distal pulses intact with no bruits No edema Neuro non-focal Skin warm and dry No muscular weakness AICD under left clavicle   Current Outpatient Prescriptions  Medication Sig Dispense Refill  . aspirin 81  MG tablet Take 81 mg by mouth daily.       [name] azithromycin (ZITHROMAX Z-PAK) 250 MG tablet Take two tabs first day with food then one tab for the next 4 days.  6 each  1  . budesonide-formoterol (SYMBICORT) 80-4.5 MCG/ACT inhaler Inhale 2 puffs into the lungs 2 (two) times daily. As needed  1 Inhaler  2  . carvedilol (COREG) 12.5 MG tablet TAKE 1 TABLET BY MOUTH TWICE DAILY TAKES PLACE OF TOPROL (DISCONTINUE TOPROL)  60 tablet  11  . EFFIENT 10 MG TABS TAKE 1 TABLET BY MOUTH ONCE DAILY  30 tablet  2  . furosemide (LASIX) 40 MG tablet Take 40 mg by mouth daily.       [name] loratadine (ALAVERT) 10 MG tablet Take 10 mg by mouth as needed.       [name] losartan (COZAAR) 50 MG tablet TAKE ONE TABLET BY MOUTH EVERY DAY TAKES PLACE OF RAMIPRIL (DISCONTINUE RAMIPRIL)  30 tablet  11  . metolazone (ZAROXOLYN) 5 MG tablet Take as needed      . mometasone (NASONEX) 50 MCG/ACT nasal spray Place 2 sprays into the nose daily. As needed  17 g  5  . potassium chloride SA (K-DUR,KLOR-CON) 20 MEQ tablet Take 1/2 tablet by mouth once daily.      . rosuvastatin (CRESTOR) 10 MG tablet Take 1 tablet (10 mg total) by mouth daily.  30 tablet  4  . spironolactone (ALDACTONE) 25 MG tablet Take 1/2 tablet by mouth once daily  30 tablet  3  . temazepam (RESTORIL) 30 MG capsule Take 1  capsule (30 mg total) by mouth at bedtime as needed for sleep.  30 capsule  2  . tiZANidine (ZANAFLEX) 4 MG tablet Take 1 tablet (4 mg total) by mouth every 6 (six) hours as needed.  60 tablet  1  . triamcinolone (NASACORT AQ) 55 MCG/ACT nasal inhaler Place 2 sprays into the nose daily.  1 Inhaler  5    Allergies  Review of patient's allergies indicates no known allergies.  Electrocardiogram:  NSR rate 74  biatrial enlargement old anterior MI  Assessment and Plan

## 2011-10-07 NOTE — Assessment & Plan Note (Signed)
Cholesterol is at goal.  Continue current dose of statin and diet Rx.  No myalgias or side effects.  F/U  LFT's in 6 months. Lab Results  Component Value Date   LDLCALC  Value: 338        Total Cholesterol/HDL:CHD Risk Coronary Heart Disease Risk Table                     Men   Women  1/2 Average Risk   3.4   3.3  Average Risk       5.0   4.4  2 X Average Risk   9.6   7.1  3 X Average Risk  23.4   11.0        Use the calculated Patient Ratio above and the CHD Risk Table to determine the patient's CHD Risk.        ATP III CLASSIFICATION (LDL):  <100     mg/dL   Optimal  100-129  mg/dL   Near or Above                    Optimal  130-159  mg/dL   Borderline  160-189  mg/dL   High  >190     mg/dL   Very High* 08/19/2009             

## 2011-10-07 NOTE — Assessment & Plan Note (Signed)
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  F/U Dr Jonny Ruiz Continue anxiolytic  Coreg helps somatic response

## 2011-10-07 NOTE — Patient Instructions (Addendum)
Your physician wants you to follow-up in:   6 MONTHS WITH DR Haywood Filler will receive a reminder letter in the mail two months in advance. If you don't receive a letter, please call our office to schedule the follow-up appointment. Your physician recommends that you continue on your current medications as directed. Please refer to the Current Medication list given to you today. Your physician has requested that you have an echocardiogram. Echocardiography is a painless test that uses sound waves to create images of your heart. It provides your doctor with information about the size and shape of your heart and how well your heart's chambers and valves are working. This procedure takes approximately one hour. There are no restrictions for this procedure. DX  CHF  Your physician has recommended that you wear an event monitor. Event monitors are medical devices that record the heart's electrical activity. Doctors most often [place] these monitors to diagnose arrhythmias. Arrhythmias are problems with the speed or rhythm of the heartbeat. The monitor is a small, portable device. You can wear one while you do your normal daily activities. This is usually used to diagnose what is causing palpitations/syncope (passing out). 21 DAY EVENT

## 2011-10-07 NOTE — Assessment & Plan Note (Signed)
Recheck EF since palpitations worse  Echo

## 2011-10-10 ENCOUNTER — Other Ambulatory Visit: Payer: Self-pay | Admitting: Cardiovascular Disease

## 2011-10-11 ENCOUNTER — Other Ambulatory Visit: Payer: Self-pay | Admitting: *Deleted

## 2011-10-11 DIAGNOSIS — E876 Hypokalemia: Secondary | ICD-10-CM

## 2011-10-11 MED ORDER — POTASSIUM CHLORIDE CRYS ER 20 MEQ PO TBCR: EXTENDED_RELEASE_TABLET | ORAL | Status: DC

## 2011-10-11 MED ORDER — FUROSEMIDE 40 MG PO TABS: 40.0000 mg | ORAL_TABLET | Freq: Every day | ORAL | Status: DC

## 2011-10-14 ENCOUNTER — Ambulatory Visit (HOSPITAL_COMMUNITY): Payer: PRIVATE HEALTH INSURANCE | Attending: Internal Medicine

## 2011-10-14 ENCOUNTER — Encounter (INDEPENDENT_AMBULATORY_CARE_PROVIDER_SITE_OTHER): Payer: PRIVATE HEALTH INSURANCE

## 2011-10-14 DIAGNOSIS — R002 Palpitations: Secondary | ICD-10-CM

## 2011-10-14 DIAGNOSIS — I379 Nonrheumatic pulmonary valve disorder, unspecified: Secondary | ICD-10-CM | POA: Insufficient documentation

## 2011-10-14 DIAGNOSIS — I079 Rheumatic tricuspid valve disease, unspecified: Secondary | ICD-10-CM | POA: Insufficient documentation

## 2011-10-14 DIAGNOSIS — I2589 Other forms of chronic ischemic heart disease: Secondary | ICD-10-CM

## 2011-10-14 DIAGNOSIS — I2510 Atherosclerotic heart disease of native coronary artery without angina pectoris: Secondary | ICD-10-CM | POA: Insufficient documentation

## 2011-10-14 DIAGNOSIS — I509 Heart failure, unspecified: Secondary | ICD-10-CM

## 2011-10-14 NOTE — Progress Notes (Signed)
Echocardiogram performed.  

## 2011-10-22 ENCOUNTER — Encounter: Payer: Self-pay | Admitting: *Deleted

## 2011-11-12 ENCOUNTER — Other Ambulatory Visit: Payer: Self-pay | Admitting: Cardiovascular Disease

## 2011-11-26 ENCOUNTER — Other Ambulatory Visit: Payer: Self-pay | Admitting: Cardiovascular Disease

## 2011-11-27 ENCOUNTER — Telehealth: Payer: Self-pay | Admitting: *Deleted

## 2011-11-27 NOTE — Telephone Encounter (Signed)
LEFT MESSAGE FOR  PT TO CALL BACK RE  MONITOR RESULTS  NSR NO VT  PER DR NISHAN./CY

## 2011-11-29 NOTE — Telephone Encounter (Signed)
PT  AWARE OF  MONITOR RESULTS ./CY 

## 2011-12-04 ENCOUNTER — Telehealth: Payer: Self-pay

## 2011-12-04 MED ORDER — TIZANIDINE HCL 4 MG PO TABS: 4.0000 mg | ORAL_TABLET | Freq: Four times a day (QID) | ORAL | Status: DC | PRN

## 2011-12-04 NOTE — Telephone Encounter (Signed)
Pharmacy requesting refill on Tizanidine 4 mg.  Advise not on current list

## 2011-12-04 NOTE — Telephone Encounter (Signed)
Done erx 

## 2012-01-01 ENCOUNTER — Other Ambulatory Visit: Payer: Self-pay

## 2012-01-01 MED ORDER — TEMAZEPAM 30 MG PO CAPS: 30.0000 mg | ORAL_CAPSULE | Freq: Every evening | ORAL | Status: DC | PRN

## 2012-01-01 NOTE — Telephone Encounter (Signed)
Done hardcopy to robin  

## 2012-01-01 NOTE — Telephone Encounter (Signed)
Faxed hardcopy to pharmacy. 

## 2012-01-07 DIAGNOSIS — R6882 Decreased libido: Secondary | ICD-10-CM | POA: Insufficient documentation

## 2012-01-13 ENCOUNTER — Encounter: Payer: Self-pay | Admitting: Internal Medicine

## 2012-01-13 ENCOUNTER — Ambulatory Visit (INDEPENDENT_AMBULATORY_CARE_PROVIDER_SITE_OTHER): Payer: PRIVATE HEALTH INSURANCE | Admitting: Internal Medicine

## 2012-01-13 VITALS — BP 133/90 | HR 84 | Ht 74.0 in | Wt 246.2 lb

## 2012-01-13 DIAGNOSIS — Z95810 Presence of automatic (implantable) cardiac defibrillator: Secondary | ICD-10-CM

## 2012-01-13 DIAGNOSIS — I5022 Chronic systolic (congestive) heart failure: Secondary | ICD-10-CM

## 2012-01-13 DIAGNOSIS — I2589 Other forms of chronic ischemic heart disease: Secondary | ICD-10-CM

## 2012-01-13 LAB — ICD DEVICE OBSERVATION
BRDY-0002RV: 40 {beats}/min
DEV-0020ICD: NEGATIVE
HV IMPEDENCE: 49 Ohm
PACEART VT: 0
RV LEAD IMPEDENCE ICD: 425 Ohm
RV LEAD THRESHOLD: 0.75 V
TOT-0009: 0
TOT-0010: 10
VF: 0

## 2012-01-13 MED ORDER — CARVEDILOL PHOSPHATE ER 40 MG PO CP24: 40.0000 mg | ORAL_CAPSULE | Freq: Every day | ORAL | Status: DC

## 2012-01-13 NOTE — Assessment & Plan Note (Signed)
Stable class II symptoms; ejection fraction is intervally improved

## 2012-01-13 NOTE — Assessment & Plan Note (Addendum)
Recent echo shows improved left ventricular function. We had a lengthy discussion regarding the importance of beta blocker therapy as well as aldosterone antagonism therapy. We discussed the potential that the fatigue that he is dealing with is coming from his carvedilol, but he likes taking it at night. I will give him samples of Coreg CR 40 and have him take it at night to see how it is that he tolerates his formulation. I've suggested that he resume his medications in sequence so as to identify which drugs are responsible for his fatigue. To that end he will start him valsartan for 2 weeks. He will follow that by Coreg CR. We will have him followup with Dr. PN 2-3 weeks later and at that point I have asked the patient to discuss with Dr. PN initiation of aldosterone antagonism therapy for long-term morbidity/mortality reduction  dsicussed for more than 25 min

## 2012-01-13 NOTE — Progress Notes (Signed)
Patient Care Team: Corwin Levins, MD as PCP - General Wendall Stade, MD (Cardiology)   HPI  Jason Hogan is a 36 y.o. 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 The procedure was compensated by hematoma.  The patient denies chest pain, edema or palpitations   he has had a recent bout of food poisoning has come off all of his meds except his antiplatelet therapy. He has felt much less fatigue.  He and his wife also been struggling with infertility  Past Medical History  Diagnosis Date  . Myocardial infarction   . CAD (coronary artery disease)   . Hyperlipidemia   . Renal insufficiency   . Respiratory failure   . Rhabdomyolysis   . Hypercholesteremia   . Migraine headache   . Bacterial pneumonia   . Heart failure   . Depression   . Insomnia   . Elevated blood pressure   . Cholelithiasis   . Asthma   . Renal calculus   . Respiratory failure   . Neck pain     right  . URI (upper respiratory infection)   . Anxiety 10/03/2011  . Allergic rhinitis, cause unspecified 10/03/2011  . MYOCARDIAL INFARCTION 09/18/2009    Qualifier: Diagnosis of  By: Kem Parkinson    . Chronic systolic heart failure 09/18/2009    Qualifier: Diagnosis of  By: Kem Parkinson    . CARDIOMYOPATHY, ISCHEMIC 10/20/2009    Qualifier: Diagnosis of  By: Graciela Husbands, MD, Ruthann Cancer Ty Hilts   . 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 ASTHMA 09/20/2006    Qualifier: Diagnosis of  By: Jonny Ruiz MD, Len Blalock   . RENAL INSUFFICIENCY 09/18/2009    Qualifier: Diagnosis of  By: Kem Parkinson    . INSOMNIA-SLEEP DISORDER-UNSPEC 06/22/2007    Qualifier: Diagnosis of  By: Jonny Ruiz MD, Len Blalock   . HYPERCHOLESTEROLEMIA 09/18/2009    Qualifier: Diagnosis of  By: Kem Parkinson    . DEPRESSION 06/22/2007    Qualifier: Diagnosis of  By: Maris Berger   . Rhabdomyolysis 09/18/2009    Qualifier: Diagnosis of  By: Kem Parkinson    . RENAL CALCULUS, HX OF 09/20/2006    Qualifier: Diagnosis of  By: Jonny Ruiz MD, Len Blalock   . MIGRAINE HEADACHE 09/18/2009    Qualifier: Diagnosis of  By: Kem Parkinson    . CHOLELITHIASIS 06/22/2007    Qualifier: Diagnosis of  By: Maris Berger     No past surgical history on file.  Current Outpatient Prescriptions  Medication Sig Dispense Refill  . aspirin 81 MG tablet Take 81 mg by mouth daily.       . budesonide-formoterol (SYMBICORT) 80-4.5 MCG/ACT inhaler Inhale 2 puffs into the lungs 2 (two) times daily. As needed  1 Inhaler  2  . carvedilol (COREG) 12.5 MG tablet TAKE 1 TABLET BY MOUTH TWICE DAILY TAKES PLACE OF TOPROL (DISCONTINUE TOPROL)  60 tablet  11  . EFFIENT 10 MG TABS TAKE 1 TABLET BY MOUTH ONCE DAILY  30 tablet  2  . furosemide (LASIX) 40 MG tablet Take 1 tablet (40 mg total) by mouth daily.  30 tablet  12  . loratadine (ALAVERT) 10 MG tablet Take 10 mg by mouth as needed.       [name] losartan (COZAAR) 50 MG tablet TAKE ONE TABLET BY  MOUTH EVERY DAY TAKES PLACE OF RAMIPRIL (DISCONTINUE RAMIPRIL)  30 tablet  11  . metolazone (ZAROXOLYN) 5 MG tablet Take as needed      . mometasone (NASONEX) 50 MCG/ACT nasal spray Place 2 sprays into the nose daily. As needed  17 g  5  . potassium chloride SA (K-DUR,KLOR-CON) 20 MEQ tablet Take 20 mEq by mouth daily. 1/2 tab po qd      . rosuvastatin (CRESTOR) 10 MG tablet Take 1 tablet (10 mg total) by mouth daily.  30 tablet  4  . spironolactone (ALDACTONE) 25 MG tablet as needed.      . temazepam (RESTORIL) 30 MG capsule Take 1 capsule (30 mg total) by mouth at bedtime as needed for sleep.  30 capsule  3  . tiZANidine (ZANAFLEX) 4 MG tablet Take 1 tablet (4 mg total) by mouth every 6 (six) hours as needed.  60 tablet  1  . triamcinolone (NASACORT AQ) 55 MCG/ACT nasal inhaler Place 2 sprays into the nose daily.  1 Inhaler  5  . [DISCONTINUED]  potassium chloride SA (K-DUR,KLOR-CON) 20 MEQ tablet 1/2 tab po qd  30 tablet  12  . [DISCONTINUED] metolazone (ZAROXOLYN) 5 MG tablet TAKE 1 TABLET BY MOUTH DAILY  30 tablet  10    No Known Allergies  Review of Systems negative except from HPI and PMH  Physical Exam BP 133/90  Pulse 84  Ht 6\' 2"  (1.88 m)  Wt 246 lb 3.2 oz (111.676 kg)  BMI 31.61 kg/m2 Well developed and well nourished in no acute distress HENT normal E scleral and icterus clear Neck Supple JVP flat; carotids brisk and full Clear to ausculation  *Regular rate and rhythm, no murmurs gallops or rub Soft with active bowel sounds No clubbing cyanosis none Edema Alert and oriented, grossly normal motor and sensory function Skin Warm and Dry    Assessment and  Plan

## 2012-01-13 NOTE — Assessment & Plan Note (Signed)
The patient's device was interrogated.  The information was reviewed. No changes were made in the programming.    

## 2012-01-13 NOTE — Patient Instructions (Signed)
Your physician wants you to follow-up in: 1 year with Dr. Graciela Husbands. You will receive a reminder letter in the mail two months in advance. If you don't receive a letter, please call our office to schedule the follow-up appointment.  Your physician recommends that you schedule a follow-up appointment in: 6 weeks with Dr. Eden Emms.  Try Coreg CR 40mg  daily.

## 2012-01-20 ENCOUNTER — Other Ambulatory Visit: Payer: Self-pay

## 2012-01-20 MED ORDER — TIZANIDINE HCL 4 MG PO TABS: 4.0000 mg | ORAL_TABLET | Freq: Four times a day (QID) | ORAL | Status: DC | PRN

## 2012-01-21 ENCOUNTER — Other Ambulatory Visit: Payer: Self-pay | Admitting: Internal Medicine

## 2012-01-21 MED ORDER — SPIRONOLACTONE 25 MG PO TABS: 12.5000 mg | ORAL_TABLET | Freq: Every day | ORAL | Status: DC

## 2012-02-24 ENCOUNTER — Ambulatory Visit (INDEPENDENT_AMBULATORY_CARE_PROVIDER_SITE_OTHER): Payer: Self-pay | Admitting: Cardiovascular Disease

## 2012-02-24 ENCOUNTER — Encounter: Payer: Self-pay | Admitting: Cardiovascular Disease

## 2012-02-24 VITALS — BP 140/82 | HR 82 | Ht 74.0 in | Wt 243.8 lb

## 2012-02-24 DIAGNOSIS — I5022 Chronic systolic (congestive) heart failure: Secondary | ICD-10-CM

## 2012-02-24 DIAGNOSIS — I2589 Other forms of chronic ischemic heart disease: Secondary | ICD-10-CM

## 2012-02-24 DIAGNOSIS — E7800 Pure hypercholesterolemia, unspecified: Secondary | ICD-10-CM

## 2012-02-24 DIAGNOSIS — Z95810 Presence of automatic (implantable) cardiac defibrillator: Secondary | ICD-10-CM

## 2012-02-24 NOTE — Assessment & Plan Note (Signed)
Normal function with no shocks  F/U SK in 6 months

## 2012-02-24 NOTE — Patient Instructions (Addendum)
Your physician recommends that you return for lab work in: 4 weeks  Your physician wants you to follow-up in: 6 months. You will receive a reminder letter in the mail two months in advance. If you don't receive a letter, please call our office to schedule the follow-up appointment.

## 2012-02-24 NOTE — Assessment & Plan Note (Signed)
Cholesterol is at goal.  Continue current dose of statin and diet Rx.  No myalgias or side effects.  F/U  LFT's in 6 months. Lab Results  Component Value Date   LDLCALC  Value: 338        Total Cholesterol/HDL:CHD Risk Coronary Heart Disease Risk Table                     Men   Women  1/2 Average Risk   3.4   3.3  Average Risk       5.0   4.4  2 X Average Risk   9.6   7.1  3 X Average Risk  23.4   11.0        Use the calculated Patient Ratio above and the CHD Risk Table to determine the patient's CHD Risk.        ATP III CLASSIFICATION (LDL):  <100     mg/dL   Optimal  161-096  mg/dL   Near or Above                    Optimal  130-159  mg/dL   Borderline  045-409  mg/dL   High  >811     mg/dL   Very High* 10/19/4780

## 2012-02-24 NOTE — Assessment & Plan Note (Signed)
No chest pain continue effient and asa

## 2012-02-24 NOTE — Progress Notes (Signed)
Patient ID: Jason Hogan, male   DOB: 1975-05-21, 37 y.o.   MRN: [id number] Jason Hogan is seen todya for F/U of CAD with large Anterior M.I. and collateralized silent distal RCA occlusion. AICD implant complicated by hematoma Weight is up quite a bit over last 8 monts about 30 lbs. Discussed low carb diet and exercise program. Headaches and TMJ symptoms better Compliant with meds. 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 With weight gain has more fatigue and some dyspnea. On chronic diuretics. Pruritis in hands when on aldactone in past   AICD checked recently 11/13 and working fine [place] normal with no signs of CHF  He has a single chamber AICD  Palpitations in August benign with event monitor showing no afib or NSVT  No SSCP, palpitatons, AICD D/C or edema. Hair and scalp improved off coreg and no rash with cozaar. Did not tolerate aldactone post m.i. with excessive fatiigue so I suspect he wont tolerate it now  ROS: Denies fever, malais, weight loss, blurry vision, decreased visual acuity, cough, sputum, SOB, hemoptysis, pleuritic pain, palpitaitons, heartburn, abdominal pain, melena, lower extremity edema, claudication, or rash.  All other systems reviewed and negative  General: Affect appropriate Healthy:  appears stated age HEENT: normal Neck supple with no adenopathy JVP normal no bruits no thyromegaly Lungs clear with no wheezing and good diaphragmatic motion Heart:  S1/S2 no murmur, no rub, gallop or click PMI normal Abdomen: benighn, BS positve, no tenderness, no AAA no bruit.  No HSM or HJR Distal pulses intact with no bruits No edema Neuro non-focal Skin warm and dry No muscular weakness   Current Outpatient Prescriptions  Medication Sig Dispense Refill  . aspirin 81 MG tablet Take 81 mg by mouth daily.       . budesonide-formoterol (SYMBICORT) 80-4.5 MCG/ACT inhaler Inhale 2 puffs into the lungs 2 (two) times daily. As needed  1 Inhaler  2  . carvedilol (COREG  CR) 40 MG 24 hr capsule Take 1 capsule (40 mg total) by mouth daily.  30 capsule  0  . carvedilol (COREG) 12.5 MG tablet TAKE 1 TABLET BY MOUTH TWICE DAILY TAKES PLACE OF TOPROL (DISCONTINUE TOPROL)  60 tablet  11  . EFFIENT 10 MG TABS TAKE 1 TABLET BY MOUTH ONCE DAILY  30 tablet  2  . furosemide (LASIX) 40 MG tablet Take 1 tablet (40 mg total) by mouth daily.  30 tablet  12  . loratadine (ALAVERT) 10 MG tablet Take 10 mg by mouth as needed.       [name] losartan (COZAAR) 50 MG tablet TAKE ONE TABLET BY MOUTH EVERY DAY TAKES PLACE OF RAMIPRIL (DISCONTINUE RAMIPRIL)  30 tablet  11  . metolazone (ZAROXOLYN) 5 MG tablet Take as needed      . mometasone (NASONEX) 50 MCG/ACT nasal spray Place 2 sprays into the nose daily. As needed  17 g  5  . potassium chloride SA (K-DUR,KLOR-CON) 20 MEQ tablet Take 20 mEq by mouth daily. 1/2 tab po qd      . rosuvastatin (CRESTOR) 10 MG tablet Take 1 tablet (10 mg total) by mouth daily.  30 tablet  4  . spironolactone (ALDACTONE) 25 MG tablet Take 0.5 tablets (12.5 mg total) by mouth daily. As directed  30 tablet  5  . temazepam (RESTORIL) 30 MG capsule Take 30 mg by mouth at bedtime as needed.      [name] tiZANidine (ZANAFLEX) 4 MG tablet Take 1 tablet (4 mg total) by mouth every  6 (six) hours as needed.  60 tablet  1  . triamcinolone (NASACORT AQ) 55 MCG/ACT nasal inhaler Place 2 sprays into the nose daily.  1 Inhaler  5    Allergies  Review of patient's allergies indicates no known allergies.  Electrocardiogram:  Assessment and Plan

## 2012-02-24 NOTE — Assessment & Plan Note (Signed)
Will check BNP and BMET in 4 weeks.  Mild dyspnea from weight gain

## 2012-03-06 ENCOUNTER — Other Ambulatory Visit: Payer: Self-pay | Admitting: *Deleted

## 2012-03-06 MED ORDER — PRASUGREL HCL 10 MG PO TABS: 10.0000 mg | ORAL_TABLET | Freq: Every day | ORAL | Status: DC

## 2012-03-19 ENCOUNTER — Telehealth: Payer: Self-pay | Admitting: Internal Medicine

## 2012-03-19 MED ORDER — CARVEDILOL PHOSPHATE ER 40 MG PO CP24: 40.0000 mg | ORAL_CAPSULE | Freq: Every day | ORAL | Status: DC

## 2012-03-19 NOTE — Telephone Encounter (Signed)
Pt needs refill on coreg and losartin asap

## 2012-03-20 ENCOUNTER — Telehealth: Payer: Self-pay | Admitting: Cardiovascular Disease

## 2012-03-20 NOTE — Telephone Encounter (Signed)
LMTCB ./CY 

## 2012-03-20 NOTE — Telephone Encounter (Signed)
New Problem    Due to insurance change, can only get generic medication. Insurance company would like pt to try generic meds again. However, meds gave him a reaction when previously taken before. Pt would like to speak to a nurse in regards to this matter.

## 2012-03-23 ENCOUNTER — Other Ambulatory Visit: Payer: Self-pay

## 2012-03-23 ENCOUNTER — Other Ambulatory Visit (INDEPENDENT_AMBULATORY_CARE_PROVIDER_SITE_OTHER): Payer: BC Managed Care – PPO

## 2012-03-23 DIAGNOSIS — I5022 Chronic systolic (congestive) heart failure: Secondary | ICD-10-CM

## 2012-03-23 LAB — BASIC METABOLIC PANEL
GFR: 90.86 mL/min (ref >60.00)
Potassium: 2.8 mEq/L — CL (ref 3.5–5.1)
Sodium: 133 mEq/L — ABNORMAL LOW (ref 135–145)

## 2012-03-23 MED ORDER — LOSARTAN POTASSIUM 50 MG PO TABS: 50.0000 mg | ORAL_TABLET | Freq: Every day | ORAL | Status: DC

## 2012-03-23 NOTE — Telephone Encounter (Signed)
Prior auth faxed awaiting  Response .Zack Seal

## 2012-03-31 ENCOUNTER — Other Ambulatory Visit: Payer: Self-pay | Admitting: *Deleted

## 2012-03-31 NOTE — Telephone Encounter (Signed)
Opened in Error.

## 2012-04-08 ENCOUNTER — Other Ambulatory Visit: Payer: Self-pay | Admitting: *Deleted

## 2012-04-08 MED ORDER — ROSUVASTATIN CALCIUM 10 MG PO TABS: 10.0000 mg | ORAL_TABLET | Freq: Every day | ORAL | Status: DC

## 2012-04-20 ENCOUNTER — Ambulatory Visit (INDEPENDENT_AMBULATORY_CARE_PROVIDER_SITE_OTHER): Payer: BC Managed Care – PPO | Admitting: *Deleted

## 2012-04-20 ENCOUNTER — Encounter: Payer: Self-pay | Admitting: Internal Medicine

## 2012-04-20 ENCOUNTER — Other Ambulatory Visit: Payer: Self-pay

## 2012-04-20 DIAGNOSIS — I5022 Chronic systolic (congestive) heart failure: Secondary | ICD-10-CM

## 2012-04-21 LAB — REMOTE ICD DEVICE
BRDY-0002RV: 40 {beats}/min
DEV-0020ICD: NEGATIVE

## 2012-04-28 ENCOUNTER — Other Ambulatory Visit: Payer: Self-pay | Admitting: Emergency Medicine

## 2012-04-28 MED ORDER — CARVEDILOL PHOSPHATE ER 40 MG PO CP24: 40.0000 mg | ORAL_CAPSULE | Freq: Every day | ORAL | Status: DC

## 2012-04-28 NOTE — Telephone Encounter (Signed)
PER PHARMACISTS MED HAS BEEN APPROVED .Jason Hogan

## 2012-05-01 ENCOUNTER — Encounter: Payer: Self-pay | Admitting: *Deleted

## 2012-05-04 ENCOUNTER — Telehealth: Payer: Self-pay | Admitting: Cardiovascular Disease

## 2012-05-04 MED ORDER — METOLAZONE 5 MG PO TABS: ORAL_TABLET | ORAL | Status: DC

## 2012-05-04 MED ORDER — POTASSIUM CHLORIDE CRYS ER 20 MEQ PO TBCR: 20.0000 meq | EXTENDED_RELEASE_TABLET | Freq: Every day | ORAL | Status: DC

## 2012-05-04 NOTE — Telephone Encounter (Signed)
PER PT  NEEDED REFILL FOR  METOLAZONE  AND KCL   REFILLS SENT VIA EPIC .Zack Seal

## 2012-05-04 NOTE — Telephone Encounter (Signed)
New Problem:    Patient called in because he needs his prscriptions for his potassium chloride SA (K-DUR,KLOR-CON) 20 MEQ tablet and furosemide (LASIX) 40 MG tablet increased.  Patient also needs a refill of his metolazone (ZAROXOLYN) 5 MG tablet.  Please call back.

## 2012-07-10 ENCOUNTER — Other Ambulatory Visit: Payer: Self-pay | Admitting: *Deleted

## 2012-07-10 MED ORDER — PRASUGREL HCL 10 MG PO TABS: 10.0000 mg | ORAL_TABLET | Freq: Every day | ORAL | Status: DC

## 2012-07-10 NOTE — Telephone Encounter (Signed)
Fax Received. Refill Completed. Brynn Reznik Chowoe (R.M.A)   

## 2012-07-20 ENCOUNTER — Encounter: Payer: Self-pay | Admitting: Internal Medicine

## 2012-07-20 ENCOUNTER — Ambulatory Visit (INDEPENDENT_AMBULATORY_CARE_PROVIDER_SITE_OTHER): Payer: BC Managed Care – PPO | Admitting: *Deleted

## 2012-07-20 DIAGNOSIS — I2589 Other forms of chronic ischemic heart disease: Secondary | ICD-10-CM

## 2012-07-20 DIAGNOSIS — I5022 Chronic systolic (congestive) heart failure: Secondary | ICD-10-CM

## 2012-07-20 DIAGNOSIS — Z95810 Presence of automatic (implantable) cardiac defibrillator: Secondary | ICD-10-CM

## 2012-07-20 LAB — REMOTE ICD DEVICE
DEV-0020ICD: NEGATIVE
HV IMPEDENCE: 53 Ohm
RV LEAD AMPLITUDE: 11.9 mv
RV LEAD IMPEDENCE ICD: 430 Ohm

## 2012-08-13 ENCOUNTER — Encounter: Payer: Self-pay | Admitting: *Deleted

## 2012-09-08 ENCOUNTER — Other Ambulatory Visit: Payer: Self-pay | Admitting: *Deleted

## 2012-09-08 DIAGNOSIS — I428 Other cardiomyopathies: Secondary | ICD-10-CM

## 2012-09-08 DIAGNOSIS — Z95810 Presence of automatic (implantable) cardiac defibrillator: Secondary | ICD-10-CM

## 2012-09-08 DIAGNOSIS — I509 Heart failure, unspecified: Secondary | ICD-10-CM

## 2012-09-08 MED ORDER — METOLAZONE 5 MG PO TABS: ORAL_TABLET | ORAL | Status: DC

## 2012-10-05 ENCOUNTER — Ambulatory Visit (INDEPENDENT_AMBULATORY_CARE_PROVIDER_SITE_OTHER): Payer: BC Managed Care – PPO

## 2012-10-05 ENCOUNTER — Encounter: Payer: Self-pay | Admitting: Internal Medicine

## 2012-10-05 ENCOUNTER — Ambulatory Visit (INDEPENDENT_AMBULATORY_CARE_PROVIDER_SITE_OTHER): Payer: BC Managed Care – PPO | Admitting: Internal Medicine

## 2012-10-05 VITALS — BP 108/80 | HR 90 | Temp 98.1°F | Ht 74.0 in | Wt 241.2 lb

## 2012-10-05 DIAGNOSIS — R7302 Impaired glucose tolerance (oral): Secondary | ICD-10-CM

## 2012-10-05 DIAGNOSIS — R7309 Other abnormal glucose: Secondary | ICD-10-CM

## 2012-10-05 DIAGNOSIS — Z0000 Encounter for general adult medical examination without abnormal findings: Secondary | ICD-10-CM

## 2012-10-05 DIAGNOSIS — G4700 Insomnia, unspecified: Secondary | ICD-10-CM

## 2012-10-05 HISTORY — DX: Impaired glucose tolerance (oral): R73.02

## 2012-10-05 LAB — HEPATIC FUNCTION PANEL
ALT: 67 U/L — ABNORMAL HIGH (ref 0–53)
Bilirubin, Direct: 0.1 mg/dL (ref 0.0–0.3)
Total Bilirubin: 1 mg/dL (ref 0.3–1.2)

## 2012-10-05 LAB — URINALYSIS, ROUTINE W REFLEX MICROSCOPIC
Nitrite: NEGATIVE
Total Protein, Urine: NEGATIVE
WBC, UA: NONE SEEN (ref 0–?)
pH: 7 (ref 5.0–8.0)

## 2012-10-05 LAB — BASIC METABOLIC PANEL
Chloride: 94 mEq/L — ABNORMAL LOW (ref 96–112)
Creatinine, Ser: 1.1 mg/dL (ref 0.4–1.5)
Potassium: 2.9 mEq/L — ABNORMAL LOW (ref 3.5–5.1)
Sodium: 135 mEq/L (ref 135–145)

## 2012-10-05 LAB — CBC WITH DIFFERENTIAL/PLATELET
Basophils Absolute: 0 K/uL (ref 0.0–0.1)
Basophils Relative: 0.5 % (ref 0.0–3.0)
Eosinophils Absolute: 0.4 K/uL (ref 0.0–0.7)
Eosinophils Relative: 5.2 % — ABNORMAL HIGH (ref 0.0–5.0)
HCT: 44.1 % (ref 39.0–52.0)
Hemoglobin: 15.2 g/dL (ref 13.0–17.0)
Lymphocytes Relative: 28.2 % (ref 12.0–46.0)
Lymphs Abs: 2.3 K/uL (ref 0.7–4.0)
MCHC: 34.4 g/dL (ref 30.0–36.0)
MCV: 80.3 fl (ref 78.0–100.0)
Monocytes Absolute: 0.8 K/uL (ref 0.1–1.0)
Monocytes Relative: 9.7 % (ref 3.0–12.0)
Neutro Abs: 4.6 K/uL (ref 1.4–7.7)
Neutrophils Relative %: 56.4 % (ref 43.0–77.0)
Platelets: 235 K/uL (ref 150.0–400.0)
RBC: 5.49 Mil/uL (ref 4.22–5.81)
RDW: 13.9 % (ref 11.5–14.6)
WBC: 8.2 K/uL (ref 4.5–10.5)

## 2012-10-05 LAB — LIPID PANEL
Cholesterol: 161 mg/dL (ref 0–200)
Total CHOL/HDL Ratio: 4
VLDL: 53.8 mg/dL — ABNORMAL HIGH (ref 0.0–40.0)

## 2012-10-05 MED ORDER — TIZANIDINE HCL 4 MG PO TABS: 4.0000 mg | ORAL_TABLET | Freq: Four times a day (QID) | ORAL | Status: DC | PRN

## 2012-10-05 MED ORDER — TEMAZEPAM 30 MG PO CAPS: 30.0000 mg | ORAL_CAPSULE | Freq: Every evening | ORAL | Status: DC | PRN

## 2012-10-05 NOTE — Assessment & Plan Note (Signed)

## 2012-10-05 NOTE — Assessment & Plan Note (Signed)
For med refill prn use,  to f/u any worsening symptoms or concerns

## 2012-10-05 NOTE — Progress Notes (Signed)
Subjective:    Patient ID: Jason Hogan, male    DOB: 1976/01/24, 37 y.o.   MRN: [id number]  HPI  Here for wellness and f/u;  Overall doing ok;  Pt denies CP, worsening SOB, DOE, wheezing, orthopnea, PND, worsening LE edema, palpitations, dizziness or syncope.  Pt denies neurological change such as new headache, facial or extremity weakness.  Pt denies polydipsia, polyuria, or low sugar symptoms. Pt states overall good compliance with treatment and medications, good tolerability, and has been trying to follow lower cholesterol diet.  Pt denies worsening depressive symptoms, suicidal ideation or panic. No fever, night sweats, wt loss, loss of appetite, or other constitutional symptoms.  Pt states good ability with ADL's, has low fall risk, home safety reviewed and adequate, no other significant changes in hearing or vision, and only occasionally active with exercise.  Cont's to try to lose wt, overall down 10 lbs from last yr. No complaints, needs med refills. Past Medical History  Diagnosis Date  . Myocardial infarction   . CAD (coronary artery disease)   . Hyperlipidemia   . Renal insufficiency   . Respiratory failure   . Rhabdomyolysis   . Hypercholesteremia   . Migraine headache   . Bacterial pneumonia   . Heart failure   . Depression   . Insomnia   . Elevated blood pressure   . Cholelithiasis   . Asthma   . Renal calculus   . Respiratory failure   . Neck pain     right  . URI (upper respiratory infection)   . Anxiety 10/03/2011  . Allergic rhinitis, cause unspecified 10/03/2011  . MYOCARDIAL INFARCTION 09/18/2009    Qualifier: Diagnosis of  By: Kem Parkinson    . Chronic systolic heart failure 09/18/2009    Qualifier: Diagnosis of  By: Kem Parkinson    . CARDIOMYOPATHY, ISCHEMIC 10/20/2009    Qualifier: Diagnosis of  By: Graciela Husbands, MD, Ruthann Cancer Ty Hilts   . 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 ASTHMA 09/20/2006    Qualifier: Diagnosis of  By: Jonny Ruiz MD, Len Blalock   . RENAL INSUFFICIENCY 09/18/2009    Qualifier: Diagnosis of  By: Kem Parkinson    . INSOMNIA-SLEEP DISORDER-UNSPEC 06/22/2007    Qualifier: Diagnosis of  By: Jonny Ruiz MD, Len Blalock   . HYPERCHOLESTEROLEMIA 09/18/2009    Qualifier: Diagnosis of  By: Kem Parkinson    . DEPRESSION 06/22/2007    Qualifier: Diagnosis of  By: Maris Berger   . Rhabdomyolysis 09/18/2009    Qualifier: Diagnosis of  By: Kem Parkinson    . RENAL CALCULUS, HX OF 09/20/2006    Qualifier: Diagnosis of  By: Jonny Ruiz MD, Len Blalock   . MIGRAINE HEADACHE 09/18/2009    Qualifier: Diagnosis of  By: Kem Parkinson    . CHOLELITHIASIS 06/22/2007    Qualifier: Diagnosis of  By: Maris Berger   . Impaired glucose tolerance 10/05/2012   No past surgical history on file.  reports that he quit smoking about 18 years ago. He has never used smokeless tobacco. He reports that  drinks alcohol. He reports that he does not use illicit drugs. family history includes Diabetes in an other family member; Heart disease in an other family member; Hypothyroidism in an other family member. No Known Allergies Current Outpatient Prescriptions on File Prior to Visit  Medication Sig Dispense Refill  . aspirin 81 MG tablet Take 81  mg by mouth daily.       . budesonide-formoterol (SYMBICORT) 80-4.5 MCG/ACT inhaler Inhale 2 puffs into the lungs 2 (two) times daily. As needed  1 Inhaler  2  . carvedilol (COREG CR) 40 MG 24 hr capsule Take 1 capsule (40 mg total) by mouth daily.  30 capsule  5  . carvedilol (COREG) 12.5 MG tablet TAKE 1 TABLET BY MOUTH TWICE DAILY TAKES PLACE OF TOPROL (DISCONTINUE TOPROL)  60 tablet  11  . furosemide (LASIX) 40 MG tablet Take 1 tablet (40 mg total) by mouth daily.  30 tablet  12  . loratadine (ALAVERT) 10 MG tablet Take 10 mg by mouth as needed.       [name] losartan (COZAAR) 50 MG tablet Take 1 tablet (50 mg total) by mouth daily.  30  tablet  11  . metolazone (ZAROXOLYN) 5 MG tablet Take as needed  30 tablet  2  . mometasone (NASONEX) 50 MCG/ACT nasal spray Place 2 sprays into the nose daily. As needed  17 g  5  . potassium chloride SA (K-DUR,KLOR-CON) 20 MEQ tablet Take 1 tablet (20 mEq total) by mouth daily.  30 tablet  11  . prasugrel (EFFIENT) 10 MG TABS Take 1 tablet (10 mg total) by mouth daily.  30 tablet  5  . rosuvastatin (CRESTOR) 10 MG tablet Take 1 tablet (10 mg total) by mouth daily.  30 tablet  4  . spironolactone (ALDACTONE) 25 MG tablet Take 0.5 tablets (12.5 mg total) by mouth daily. As directed  30 tablet  5  . triamcinolone (NASACORT AQ) 55 MCG/ACT nasal inhaler Place 2 sprays into the nose daily.  1 Inhaler  5   No current facility-administered medications on file prior to visit.   Review of Systems Constitutional: Negative for diaphoresis, activity change, appetite change or unexpected weight change.  HENT: Negative for hearing loss, ear pain, facial swelling, mouth sores and neck stiffness.   Eyes: Negative for pain, redness and visual disturbance.  Respiratory: Negative for shortness of breath and wheezing.   Cardiovascular: Negative for chest pain and palpitations.  Gastrointestinal: Negative for diarrhea, blood in stool, abdominal distention or other pain Genitourinary: Negative for hematuria, flank pain or change in urine volume.  Musculoskeletal: Negative for myalgias and joint swelling.  Skin: Negative for color change and wound.  Neurological: Negative for syncope and numbness. other than noted Hematological: Negative for adenopathy.  Psychiatric/Behavioral: Negative for hallucinations, self-injury, decreased concentration and agitation.      Objective:   Physical Exam BP 108/80  Pulse 90  Temp(Src) 98.1 F (36.7 C) (Oral)  Ht 6\' 2"  (1.88 m)  Wt 241 lb 4 oz (109.43 kg)  BMI 30.96 kg/m2  SpO2 95% VS noted, not ill appearing Constitutional: Pt is oriented to person, place, and time.  Appears well-developed and well-nourished.  Head: Normocephalic and atraumatic.  Right Ear: External ear normal.  Left Ear: External ear normal.  Nose: Nose normal.  Mouth/Throat: Oropharynx is clear and moist.  Eyes: Conjunctivae and EOM are normal. Pupils are equal, round, and reactive to light.  Neck: Normal range of motion. Neck supple. No JVD present. No tracheal deviation present.  Cardiovascular: Normal rate, regular rhythm, normal heart sounds and intact distal pulses.   Pulmonary/Chest: Effort normal and breath sounds normal.  Abdominal: Soft. Bowel sounds are normal. There is no tenderness. No HSM  Musculoskeletal: Normal range of motion. Exhibits no edema.  Lymphadenopathy:  Has no cervical adenopathy.  Neurological: Pt is  alert and oriented to person, place, and time. Pt has normal reflexes. No cranial nerve deficit.  Skin: Skin is warm and dry. No rash noted. No LE edema Psychiatric:  Has  normal mood and affect. Behavior is normal.     Assessment & Plan:

## 2012-10-05 NOTE — Patient Instructions (Signed)
Please continue all other medications as before, and refills have been done if requested. Please have the pharmacy call with any other refills you may need. Please continue your efforts at being more active, low cholesterol diet, and weight control. You are otherwise up to date with prevention measures today. Please go to the LAB in the Basement (turn left off the elevator) for the tests to be done today You will be contacted by phone if any changes need to be made immediately.  Otherwise, you will receive a letter about your results with an explanation, but please check with MyChart first.  Please remember to sign up for My Chart if you have not done so, as this will be important to you in the future with finding out test results, communicating by private email, and scheduling acute appointments online when needed.  Please return in 1 year for your yearly visit, or sooner if needed, with Lab testing done 3-5 days before  Please keep your appointments with your specialists as you have planned

## 2012-10-05 NOTE — Assessment & Plan Note (Signed)
Also for a1c Lab Results  Component Value Date   WBC 9.3 10/02/2011   HGB 14.5 10/02/2011   HCT 44.9 10/02/2011   PLT 186.0 10/02/2011   GLUCOSE 113* 03/23/2012   CHOL 156 10/02/2011   TRIG 268.0* 10/02/2011   HDL 36.50* 10/02/2011   LDLDIRECT 95.2 10/02/2011   LDLCALC  Value: 338        Total Cholesterol/HDL:CHD Risk Coronary Heart Disease Risk Table                     Men   Women  1/2 Average Risk   3.4   3.3  Average Risk       5.0   4.4  2 X Average Risk   9.6   7.1  3 X Average Risk  23.4   11.0        Use the calculated Patient Ratio above and the CHD Risk Table to determine the patient's CHD Risk.        ATP III CLASSIFICATION (LDL):  <100     mg/dL   Optimal  161-096  mg/dL   Near or Above                    Optimal  130-159  mg/dL   Borderline  045-409  mg/dL   High  >811     mg/dL   Very High* 10/19/4780   ALT 26 10/02/2011   AST 27 10/02/2011   NA 133* 03/23/2012   K 2.8* 03/23/2012   CL 93* 03/23/2012   CREATININE 1.0 03/23/2012   BUN 17 03/23/2012   CO2 30 03/23/2012   TSH 1.63 10/02/2011   INR 1.07 11/27/2009   HGBA1C  Value: 5.0 (NOTE)                                                                       According to the ADA Clinical Practice Recommendations for 2011, when HbA1c is used as a screening test:   >=6.5%   Diagnostic of Diabetes Mellitus           (if abnormal result  is confirmed)  5.7-6.4%   Increased risk of developing Diabetes Mellitus  References:Diagnosis and Classification of Diabetes Mellitus,Diabetes Care,2011,34(Suppl 1):S62-S69 and Standards of Medical Care in         Diabetes - 2011,Diabetes Care,2011,34  (Suppl 1):S11-S61. 08/19/2009

## 2012-10-06 ENCOUNTER — Telehealth: Payer: Self-pay | Admitting: Internal Medicine

## 2012-10-06 ENCOUNTER — Other Ambulatory Visit: Payer: Self-pay | Admitting: Internal Medicine

## 2012-10-06 ENCOUNTER — Telehealth: Payer: Self-pay | Admitting: *Deleted

## 2012-10-06 ENCOUNTER — Encounter: Payer: Self-pay | Admitting: Internal Medicine

## 2012-10-06 DIAGNOSIS — R945 Abnormal results of liver function studies: Secondary | ICD-10-CM

## 2012-10-06 LAB — LDL CHOLESTEROL, DIRECT: Direct LDL: 102.3 mg/dL

## 2012-10-06 MED ORDER — POTASSIUM CHLORIDE CRYS ER 20 MEQ PO TBCR: 20.0000 meq | EXTENDED_RELEASE_TABLET | Freq: Two times a day (BID) | ORAL | Status: DC

## 2012-10-06 NOTE — Telephone Encounter (Signed)
This has been ordered already, he should be called soon

## 2012-10-06 NOTE — Telephone Encounter (Signed)
Spoke with pt, advised of MDs message.  Pt states he has not had an Abd U/S recently.

## 2012-10-06 NOTE — Telephone Encounter (Signed)
Robin to ask pt to come for another blood draw  -  The acute hepatitis profile

## 2012-10-07 NOTE — Telephone Encounter (Signed)
Patient informed. 

## 2012-10-14 ENCOUNTER — Other Ambulatory Visit: Payer: Self-pay | Admitting: *Deleted

## 2012-10-14 MED ORDER — FUROSEMIDE 40 MG PO TABS: 40.0000 mg | ORAL_TABLET | Freq: Every day | ORAL | Status: DC

## 2012-10-20 ENCOUNTER — Ambulatory Visit (INDEPENDENT_AMBULATORY_CARE_PROVIDER_SITE_OTHER): Payer: BC Managed Care – PPO | Admitting: *Deleted

## 2012-10-20 DIAGNOSIS — I5022 Chronic systolic (congestive) heart failure: Secondary | ICD-10-CM

## 2012-10-20 DIAGNOSIS — I2589 Other forms of chronic ischemic heart disease: Secondary | ICD-10-CM

## 2012-10-20 DIAGNOSIS — Z95810 Presence of automatic (implantable) cardiac defibrillator: Secondary | ICD-10-CM

## 2012-10-21 LAB — REMOTE ICD DEVICE
HV IMPEDENCE: 49 Ohm
RV LEAD AMPLITUDE: 11.9 mv
RV LEAD IMPEDENCE ICD: 430 Ohm
VENTRICULAR PACING ICD: 1 pct

## 2012-11-02 ENCOUNTER — Ambulatory Visit
Admission: RE | Admit: 2012-11-02 | Discharge: 2012-11-02 | Disposition: A | Discharge status: Home / Self Care | Payer: BC Managed Care – PPO | Source: Ambulatory Visit | Attending: Internal Medicine | Admitting: Internal Medicine

## 2012-11-02 ENCOUNTER — Other Ambulatory Visit (INDEPENDENT_AMBULATORY_CARE_PROVIDER_SITE_OTHER): Payer: BC Managed Care – PPO

## 2012-11-02 ENCOUNTER — Other Ambulatory Visit: Payer: Self-pay | Admitting: Cardiovascular Disease

## 2012-11-02 DIAGNOSIS — Z0000 Encounter for general adult medical examination without abnormal findings: Secondary | ICD-10-CM

## 2012-11-02 DIAGNOSIS — R945 Abnormal results of liver function studies: Secondary | ICD-10-CM

## 2012-11-02 LAB — BASIC METABOLIC PANEL
BUN: 15 mg/dL (ref 6–23)
CO2: 30 mEq/L (ref 19–32)
Chloride: 96 mEq/L (ref 96–112)
Creatinine, Ser: 1 mg/dL (ref 0.4–1.5)

## 2012-11-02 LAB — URINALYSIS, ROUTINE W REFLEX MICROSCOPIC
Ketones, ur: NEGATIVE
RBC / HPF: NONE SEEN (ref 0–?)
Specific Gravity, Urine: 1.02 (ref 1.000–1.030)
Total Protein, Urine: NEGATIVE
Urine Glucose: NEGATIVE
pH: 7 (ref 5.0–8.0)

## 2012-11-02 LAB — CBC WITH DIFFERENTIAL/PLATELET
Basophils Relative: 0.5 % (ref 0.0–3.0)
Eosinophils Absolute: 0.6 10*3/uL (ref 0.0–0.7)
Eosinophils Relative: 8.2 % — ABNORMAL HIGH (ref 0.0–5.0)
Lymphocytes Relative: 29.7 % (ref 12.0–46.0)
MCHC: 34.5 g/dL (ref 30.0–36.0)
Neutrophils Relative %: 52.6 % (ref 43.0–77.0)
RBC: 4.95 Mil/uL (ref 4.22–5.81)
WBC: 7.1 10*3/uL (ref 4.5–10.5)

## 2012-11-02 LAB — LIPID PANEL
Cholesterol: 138 mg/dL (ref 0–200)
LDL Cholesterol: 83 mg/dL (ref 0–99)
Total CHOL/HDL Ratio: 4

## 2012-11-02 LAB — HEPATIC FUNCTION PANEL
Alkaline Phosphatase: 61 U/L (ref 39–117)
Bilirubin, Direct: 0.1 mg/dL (ref 0.0–0.3)
Total Protein: 7.6 g/dL (ref 6.0–8.3)

## 2012-11-02 LAB — TSH: TSH: 1.12 u[IU]/mL (ref 0.35–5.50)

## 2012-11-02 NOTE — Progress Notes (Signed)
ICD remote. All functions normal, no NSVT. Full details in [facility].   

## 2012-11-03 LAB — HEPATITIS PANEL, ACUTE
Hep A IgM: NEGATIVE
Hep B C IgM: NEGATIVE
Hepatitis B Surface Ag: NEGATIVE

## 2012-11-10 ENCOUNTER — Encounter: Payer: Self-pay | Admitting: *Deleted

## 2012-11-18 ENCOUNTER — Encounter: Payer: Self-pay | Admitting: Internal Medicine

## 2012-11-30 ENCOUNTER — Other Ambulatory Visit: Payer: Self-pay | Admitting: Internal Medicine

## 2012-12-09 ENCOUNTER — Other Ambulatory Visit: Payer: Self-pay | Admitting: Internal Medicine

## 2012-12-24 ENCOUNTER — Other Ambulatory Visit: Payer: Self-pay

## 2012-12-29 ENCOUNTER — Other Ambulatory Visit: Payer: Self-pay | Admitting: Cardiovascular Disease

## 2013-01-27 ENCOUNTER — Encounter: Payer: Self-pay | Admitting: *Deleted

## 2013-02-22 ENCOUNTER — Other Ambulatory Visit: Payer: Self-pay

## 2013-02-22 MED ORDER — PRASUGREL HCL 10 MG PO TABS: 10.0000 mg | ORAL_TABLET | Freq: Every day | ORAL | Status: DC

## 2013-02-23 ENCOUNTER — Other Ambulatory Visit: Payer: Self-pay | Admitting: Internal Medicine

## 2013-02-23 ENCOUNTER — Other Ambulatory Visit: Payer: Self-pay | Admitting: Cardiovascular Disease

## 2013-03-11 ENCOUNTER — Other Ambulatory Visit: Payer: Self-pay | Admitting: Internal Medicine

## 2013-03-29 ENCOUNTER — Ambulatory Visit (INDEPENDENT_AMBULATORY_CARE_PROVIDER_SITE_OTHER): Payer: BC Managed Care – PPO | Admitting: Physician Assistant

## 2013-03-29 ENCOUNTER — Encounter: Payer: Self-pay | Admitting: Physician Assistant

## 2013-03-29 ENCOUNTER — Other Ambulatory Visit: Payer: Self-pay | Admitting: Internal Medicine

## 2013-03-29 VITALS — BP 110/74 | HR 79 | Temp 98.1°F | Ht 74.0 in | Wt 230.4 lb

## 2013-03-29 DIAGNOSIS — J329 Chronic sinusitis, unspecified: Secondary | ICD-10-CM

## 2013-03-29 MED ORDER — AMOXICILLIN-POT CLAVULANATE 875-125 MG PO TABS: 1.0000 | ORAL_TABLET | Freq: Two times a day (BID) | ORAL | Status: DC

## 2013-03-29 NOTE — Progress Notes (Signed)
Subjective:      Patient ID: Jason Hogan is a 38 y.o. male DOB: [birth year] MRN: [id number]    HPI  complains of head cold symptoms, ?sinusitus Onset >1 week ago, initially improved then relapsing and worse symptoms  First associated with rhinorrhea, sneezing, sore throat, mild headache and  Now sinus pressure and mild-mod nasal congestion, yellow-green discharge, ear pain Minimal relief with OTC meds Mucinex and BC powder. Precipitated by sick contacts, wife and both children had similar   Past Medical History  Diagnosis Date  . Myocardial infarction   . CAD (coronary artery disease)   . Hyperlipidemia   . Renal insufficiency   . Respiratory failure   . Rhabdomyolysis   . Hypercholesteremia   . Migraine headache   . Bacterial pneumonia   . Heart failure   . Depression   . Insomnia   . Elevated blood pressure   . Cholelithiasis   . Asthma   . Renal calculus   . Respiratory failure   . Neck pain     right  . URI (upper respiratory infection)   . Anxiety 10/03/2011  . Allergic rhinitis, cause unspecified 10/03/2011  . MYOCARDIAL INFARCTION 09/18/2009    Qualifier: Diagnosis of  By: Kem Parkinson    . Chronic systolic heart failure 09/18/2009    Qualifier: Diagnosis of  By: Kem Parkinson    . CARDIOMYOPATHY, ISCHEMIC 10/20/2009    Qualifier: Diagnosis of  By: Graciela Husbands, MD, Ruthann Cancer Ty Hilts   . 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 ASTHMA 09/20/2006    Qualifier: Diagnosis of  By: Jonny Ruiz MD, Len Blalock   . RENAL INSUFFICIENCY 09/18/2009    Qualifier: Diagnosis of  By: Kem Parkinson    . INSOMNIA-SLEEP DISORDER-UNSPEC 06/22/2007    Qualifier: Diagnosis of  By: Jonny Ruiz MD, Len Blalock   . HYPERCHOLESTEROLEMIA 09/18/2009    Qualifier: Diagnosis of  By: Kem Parkinson    . DEPRESSION 06/22/2007    Qualifier: Diagnosis of  By: Maris Berger   . Rhabdomyolysis 09/18/2009   Qualifier: Diagnosis of  By: Kem Parkinson    . RENAL CALCULUS, HX OF 09/20/2006    Qualifier: Diagnosis of  By: Jonny Ruiz MD, Len Blalock   . MIGRAINE HEADACHE 09/18/2009    Qualifier: Diagnosis of  By: Kem Parkinson    . CHOLELITHIASIS 06/22/2007    Qualifier: Diagnosis of  By: Maris Berger   . Impaired glucose tolerance 10/05/2012    Review of Systems Constitutional: No night sweats, no unexpected weight change, no fever HEENT: no pain/difficulty swallowing Abdomin: no N/V/D or constipation Pulmonary:wheezing resolved with inhaler,  Cardiovascular: No chest pain or palpitations     Objective:   Physical Exam BP 110/74  Pulse 79  Temp(Src) 98.1 F (36.7 C) (Oral)  Ht 6\' 2"  (1.88 m)  Wt 230 lb 6.4 oz (104.509 kg)  BMI 29.57 kg/m2  SpO2 98% GEN: mildly ill appearing and audible head/chest congestion HENT: NCAT, mild sinus tenderness bilaterally, nares with thick discharge and turbinate swelling, oropharynx mild erythema and PND, no exudate Eyes: Vision grossly intact, no conjunctivitis Lungs: Clear to auscultation without rhonchi or wheeze, no increased work of breathing Cardiovascular: Regular rate and rhythm, no bilateral edema  Lab Results  Component Value Date   WBC 7.1 11/02/2012   HGB 13.8 11/02/2012   HCT 40.0 11/02/2012   PLT 198.0 11/02/2012   GLUCOSE 88 11/02/2012  CHOL 138 11/02/2012   TRIG 100.0 11/02/2012   HDL 35.30* 11/02/2012   LDLDIRECT 102.3 10/05/2012   LDLCALC 83 11/02/2012   ALT 24 11/02/2012   AST 23 11/02/2012   NA 133* 11/02/2012   K 3.3* 11/02/2012   CL 96 11/02/2012   CREATININE 1.0 11/02/2012   BUN 15 11/02/2012   CO2 30 11/02/2012   TSH 1.12 11/02/2012   INR 1.07 11/27/2009   HGBA1C 5.8 10/05/2012      Assessment & Plan:  Viral URI > progression to acute sinusitis Cough, postnasal drip related to above   Empiric antibiotics prescribed due to symptom duration greater than 7 days and progression despite OTC symptomatic care Will continue use  of Mucinex at home for cough relief.  Patient to RTO if no improvement of symptoms.

## 2013-03-29 NOTE — Patient Instructions (Signed)
It was great meeting you today Mr.  Suleman!   A prescription has been sent to your pharmacy, please take as directed.  Please return to the office if no improvement of symptoms.  Sinusitis Sinusitis is redness, soreness, and puffiness (inflammation) of the air pockets in the bones of your face (sinuses). The redness, soreness, and puffiness can cause air and mucus to get trapped in your sinuses. This can allow germs to grow and cause an infection.  HOME CARE   Drink enough fluids to keep your pee (urine) clear or pale yellow.  Use a humidifier in your home.  Run a hot shower to create steam in the bathroom. Sit in the bathroom with the door closed. Breathe in the steam 3 4 times a day.  Put a warm, moist washcloth on your face 3 4 times a day, or as told by your doctor.  Use salt water sprays (saline sprays) to wet the thick fluid in your nose. This can help the sinuses drain.  Only take medicine as told by your doctor. GET HELP RIGHT AWAY IF:   Your pain gets worse.  You have very bad headaches.  You are sick to your stomach (nauseous).  You throw up (vomit).  You are very sleepy (drowsy) all the time.  Your face is puffy (swollen).  Your vision changes.  You have a stiff neck.  You have trouble breathing. MAKE SURE YOU:   Understand these instructions.  Will watch your condition.  Will get help right away if you are not doing well or get worse. Document Released: 07/24/2007 Document Revised: 10/30/2011 Document Reviewed: 09/10/2011 [facility] Patient Information 2014 [place], [place].

## 2013-03-29 NOTE — Progress Notes (Signed)
Pre visit review using our clinic review tool, if applicable. No additional management support is needed unless otherwise documented below in the visit note. 

## 2013-04-14 ENCOUNTER — Other Ambulatory Visit: Payer: Self-pay | Admitting: Internal Medicine

## 2013-04-16 NOTE — Telephone Encounter (Signed)
Done hardcopy to robin  

## 2013-04-16 NOTE — Telephone Encounter (Signed)
Faxed hardcopy to [facility] [place] 

## 2013-04-30 ENCOUNTER — Other Ambulatory Visit: Payer: Self-pay | Admitting: Internal Medicine

## 2013-05-03 ENCOUNTER — Other Ambulatory Visit: Payer: Self-pay | Admitting: Cardiovascular Disease

## 2013-06-03 ENCOUNTER — Other Ambulatory Visit: Payer: Self-pay | Admitting: Internal Medicine

## 2013-06-03 ENCOUNTER — Ambulatory Visit (INDEPENDENT_AMBULATORY_CARE_PROVIDER_SITE_OTHER): Payer: BC Managed Care – PPO | Admitting: Family Medicine

## 2013-06-03 ENCOUNTER — Other Ambulatory Visit: Payer: Self-pay | Admitting: Cardiovascular Disease

## 2013-06-03 ENCOUNTER — Encounter: Payer: Self-pay | Admitting: Family Medicine

## 2013-06-03 ENCOUNTER — Encounter: Payer: Self-pay | Admitting: Internal Medicine

## 2013-06-03 VITALS — BP 100/70 | HR 92 | Temp 98.8°F | Wt 231.0 lb

## 2013-06-03 DIAGNOSIS — J309 Allergic rhinitis, unspecified: Secondary | ICD-10-CM

## 2013-06-03 DIAGNOSIS — I255 Ischemic cardiomyopathy: Secondary | ICD-10-CM

## 2013-06-03 DIAGNOSIS — J069 Acute upper respiratory infection, unspecified: Secondary | ICD-10-CM

## 2013-06-03 DIAGNOSIS — J45909 Unspecified asthma, uncomplicated: Secondary | ICD-10-CM

## 2013-06-03 MED ORDER — BENZONATATE 100 MG PO CAPS: 100.0000 mg | ORAL_CAPSULE | Freq: Two times a day (BID) | ORAL | Status: DC | PRN

## 2013-06-03 MED ORDER — BUDESONIDE-FORMOTEROL FUMARATE 80-4.5 MCG/ACT IN AERO: INHALATION_SPRAY | RESPIRATORY_TRACT | Status: DC

## 2013-06-03 NOTE — Progress Notes (Signed)
Chief Complaint  Patient presents with  . Cough    congestion, sinus congestion, sob     HPI:   -started: a few days ago -symptoms:nasal congestion, sore throat, cough, PND, mild wheezing -denies:fever, SOB, NVD, tooth pain -has tried: musinex -sick contacts/travel/risks: denies flu exposure, tick exposure or or Ebola risks -Hx of: allergies and mild EIB - wants refill on inhaler ROS: See pertinent positives and negatives per HPI.  Past Medical History  Diagnosis Date  . Myocardial infarction   . CAD (coronary artery disease)   . Hyperlipidemia   . Renal insufficiency   . Respiratory failure   . Rhabdomyolysis   . Hypercholesteremia   . Migraine headache   . Bacterial pneumonia   . Heart failure   . Depression   . Insomnia   . Elevated blood pressure   . Cholelithiasis   . Asthma   . Renal calculus   . Respiratory failure   . Neck pain     right  . URI (upper respiratory infection)   . Anxiety 10/03/2011  . Allergic rhinitis, cause unspecified 10/03/2011  . MYOCARDIAL INFARCTION 09/18/2009    Qualifier: Diagnosis of  By: Kem ParkinsonBarnes, Kimalexis    . Chronic systolic heart failure 09/18/2009    Qualifier: Diagnosis of  By: Kem ParkinsonBarnes, Kimalexis    . CARDIOMYOPATHY, ISCHEMIC 10/20/2009    Qualifier: Diagnosis of  By: Graciela HusbandsKlein, MD, Ruthann CancerFACC, Ty HiltsSteven Cochran   . AUTOMATIC IMPLANTABLE CARDIAC DEFIBRILLATOR SITU 01/17/2010    Qualifier: Diagnosis of  By: Eden EmmsNishan, MD, Harrington ChallengerFACC, Peter Charles   . ASTHMA 09/20/2006    Qualifier: Diagnosis of  By: Jonny RuizJohn MD, Len BlalockJames W   . RENAL INSUFFICIENCY 09/18/2009    Qualifier: Diagnosis of  By: Kem ParkinsonBarnes, Kimalexis    . INSOMNIA-SLEEP DISORDER-UNSPEC 06/22/2007    Qualifier: Diagnosis of  By: Jonny RuizJohn MD, Len BlalockJames W   . HYPERCHOLESTEROLEMIA 09/18/2009    Qualifier: Diagnosis of  By: Kem ParkinsonBarnes, Kimalexis    . DEPRESSION 06/22/2007    Qualifier: Diagnosis of  By: Maris BergerSherwood, Elizabeth Ann   . Rhabdomyolysis 09/18/2009    Qualifier: Diagnosis of  By: Kem ParkinsonBarnes, Kimalexis    . RENAL CALCULUS,  HX OF 09/20/2006    Qualifier: Diagnosis of  By: Jonny RuizJohn MD, Len BlalockJames W   . MIGRAINE HEADACHE 09/18/2009    Qualifier: Diagnosis of  By: Kem ParkinsonBarnes, Kimalexis    . CHOLELITHIASIS 06/22/2007    Qualifier: Diagnosis of  By: Maris BergerSherwood, Elizabeth Ann   . Impaired glucose tolerance 10/05/2012    No past surgical history on file.  Family History  Problem Relation Age of Onset  . Heart disease    . Diabetes    . Hypothyroidism      History   Social History  . Marital Status: Married    Spouse Name: N/A    Number of Children: 0  . Years of Education: N/A   Occupational History  . rei    Social History Main Topics  . Smoking status: Former Smoker -- 0.10 packs/day for 2 years    Quit date: 02/18/1994  . Smokeless tobacco: Never Used  . Alcohol Use: Yes  . Drug Use: No  . Sexual Activity: None   Other Topics Concern  . None   Social History Narrative  . None    Current outpatient prescriptions:aspirin 81 MG tablet, Take 81 mg by mouth daily. , Disp: , Rfl: ;  budesonide-formoterol (SYMBICORT) 80-4.5 MCG/ACT inhaler, INHALE 2 PUFFS INTO THE LUNGS TWICE DAILY AS NEEDED, Disp: 10.2 g,  Rfl: 0;  COREG CR 40 MG 24 hr capsule, TAKE ONE CAPSULE BY MOUTH DAILY, Disp: 30 capsule, Rfl: 0;  furosemide (LASIX) 40 MG tablet, TAKE 1 TABLET BY MOUTH DAILY, Disp: 30 tablet, Rfl: 0 guaiFENesin (MUCINEX) 600 MG 12 hr tablet, Take 600 mg by mouth 2 (two) times daily., Disp: , Rfl: ;  lisinopril (PRINIVIL,ZESTRIL) 20 MG tablet, Take 20 mg by mouth daily., Disp: , Rfl: ;  loratadine (ALAVERT) 10 MG tablet, Take 10 mg by mouth as needed. , Disp: , Rfl: ;  metolazone (ZAROXOLYN) 5 MG tablet, TAKE AS NEEDED. NEED APPOINTMENT., Disp: 30 tablet, Rfl: 0 mometasone (NASONEX) 50 MCG/ACT nasal spray, Place 2 sprays into the nose daily. As needed, Disp: 17 g, Rfl: 5;  potassium chloride SA (K-DUR,KLOR-CON) 20 MEQ tablet, Take 1 tablet (20 mEq total) by mouth 2 (two) times daily., Disp: 60 tablet, Rfl: 11;  prasugrel (EFFIENT) 10  MG TABS tablet, Take 1 tablet (10 mg total) by mouth daily., Disp: 30 tablet, Rfl: 1;  simvastatin (ZOCOR) 20 MG tablet, Take 20 mg by mouth daily., Disp: , Rfl:  spironolactone (ALDACTONE) 25 MG tablet, TAKE 1/2 TABLET BY MOUTH EVERY DAY AS DIRECTED, Disp: 15 tablet, Rfl: 0;  temazepam (RESTORIL) 30 MG capsule, TAKE 1 CAPSULE BY MOUTH EVERY NIGHT AT BEDTIME AS NEEDED, Disp: 30 capsule, Rfl: 5;  tiZANidine (ZANAFLEX) 4 MG tablet, TAKE 1 TABLET BY MOUTH EVERY 6 HOURS AS NEEDED, Disp: 60 tablet, Rfl: 0 triamcinolone (NASACORT) 55 MCG/ACT nasal inhaler, PLACE 2 SPRAYS INTO THE NOSE DAILY, Disp: 16.5 Inhaler, Rfl: 0;  benzonatate (TESSALON) 100 MG capsule, Take 1 capsule (100 mg total) by mouth 2 (two) times daily as needed for cough., Disp: 20 capsule, Rfl: 0  EXAM:  Filed Vitals:   06/03/13 1349  BP: 100/70  Pulse: 92  Temp: 98.8 F (37.1 C)    Body mass index is 29.65 kg/(m^2).  GENERAL: vitals reviewed and listed above, alert, oriented, appears well hydrated and in no acute distress  HEENT: atraumatic, conjunttiva clear, no obvious abnormalities on inspection of external nose and ears, normal appearance of ear canals and TMs, clear nasal congestion, mild post oropharyngeal erythema with PND, no tonsillar edema or exudate, no sinus TTP  NECK: no obvious masses on inspection  LUNGS: clear to auscultation bilaterally, no wheezes, rales or rhonchi, good air movement  CV: HRRR, no peripheral edema  MS: moves all extremities without noticeable abnormality  PSYCH: pleasant and cooperative, no obvious depression or anxiety  ASSESSMENT AND PLAN:  Discussed the following assessment and plan:  ASTHMA - Plan: budesonide-formoterol (SYMBICORT) 80-4.5 MCG/ACT inhaler  Allergic rhinitis  Upper respiratory infection - Plan: benzonatate (TESSALON) 100 MG capsule  -given HPI and exam findings today, a serious infection or illness is unlikely. We discussed potential etiologies, with VURI being  most likely, and advised supportive care and monitoring. We discussed treatment side effects, likely course, antibiotic misuse, transmission, and signs of developing a serious illness. -flonase, antihistamine, reflled symbicort, tessalon, return precuaitons -of course, we advised to return or notify a doctor immediately if symptoms worsen or persist or new concerns arise.    There are no Patient Instructions on file for this visit.   Terressa Koyanagi

## 2013-06-03 NOTE — Progress Notes (Signed)
Pre visit review using our clinic review tool, if applicable. No additional management support is needed unless otherwise documented below in the visit note. 

## 2013-06-04 ENCOUNTER — Telehealth: Payer: Self-pay | Admitting: Internal Medicine

## 2013-06-04 DIAGNOSIS — J069 Acute upper respiratory infection, unspecified: Secondary | ICD-10-CM

## 2013-06-04 MED ORDER — BENZONATATE 100 MG PO CAPS: 100.0000 mg | ORAL_CAPSULE | Freq: Two times a day (BID) | ORAL | Status: DC | PRN

## 2013-06-04 NOTE — Telephone Encounter (Signed)
Pt was informed by pharmacy that there is back order from the manufacture on the following rx benzonatate (TESSALON) 100 MG capsule His cough is not getting better and would like to know if you can prescribe something else

## 2013-06-04 NOTE — Telephone Encounter (Signed)
Called and spoke with [facility] and tessalon capsules are on back order at all [facility].  [facility] [address] and they can get 100's and not 200's .  Left a vm for pt that [facility] was sent there [facility]) for tessalon capsules.

## 2013-06-10 ENCOUNTER — Ambulatory Visit (INDEPENDENT_AMBULATORY_CARE_PROVIDER_SITE_OTHER): Payer: BC Managed Care – PPO | Admitting: Family Medicine

## 2013-06-10 ENCOUNTER — Encounter: Payer: Self-pay | Admitting: Internal Medicine

## 2013-06-10 ENCOUNTER — Encounter: Payer: Self-pay | Admitting: Family Medicine

## 2013-06-10 VITALS — BP 94/58 | HR 78 | Temp 99.7°F | Ht 74.0 in | Wt 237.0 lb

## 2013-06-10 DIAGNOSIS — J309 Allergic rhinitis, unspecified: Secondary | ICD-10-CM

## 2013-06-10 DIAGNOSIS — J322 Chronic ethmoidal sinusitis: Secondary | ICD-10-CM

## 2013-06-10 DIAGNOSIS — J45909 Unspecified asthma, uncomplicated: Secondary | ICD-10-CM

## 2013-06-10 MED ORDER — AMOXICILLIN 875 MG PO TABS: 875.0000 mg | ORAL_TABLET | Freq: Two times a day (BID) | ORAL | Status: DC

## 2013-06-10 MED ORDER — DOXYCYCLINE HYCLATE 100 MG PO TABS: 100.0000 mg | ORAL_TABLET | Freq: Two times a day (BID) | ORAL | Status: DC

## 2013-06-10 MED ORDER — PREDNISONE 20 MG PO TABS: 40.0000 mg | ORAL_TABLET | Freq: Every day | ORAL | Status: DC

## 2013-06-10 MED ORDER — MOMETASONE FUROATE 50 MCG/ACT NA SUSP: 2.0000 | Freq: Every day | NASAL | Status: DC

## 2013-06-10 NOTE — Progress Notes (Signed)
No chief complaint on file.   HPI:  -started: 10 days ago -symptoms:nasal congestion, sore throat, cough - starting feeling worse the last few days with R maxillary sinus pain, mild wheezing -denies:fever, NVD, tooth pain -has tried: flonase, musinex, symbicort -sick contacts/travel/risks: denies flu exposure, tick exposure or or Ebola risks -Hx of: allergies ROS: See pertinent positives and negatives per HPI.  Past Medical History  Diagnosis Date  . Myocardial infarction   . CAD (coronary artery disease)   . Hyperlipidemia   . Renal insufficiency   . Respiratory failure   . Rhabdomyolysis   . Hypercholesteremia   . Migraine headache   . Bacterial pneumonia   . Heart failure   . Depression   . Insomnia   . Elevated blood pressure   . Cholelithiasis   . Asthma   . Renal calculus   . Respiratory failure   . Neck pain     right  . URI (upper respiratory infection)   . Anxiety 10/03/2011  . Allergic rhinitis, cause unspecified 10/03/2011  . MYOCARDIAL INFARCTION 09/18/2009    Qualifier: Diagnosis of  By: Kem ParkinsonBarnes, Kimalexis    . Chronic systolic heart failure 09/18/2009    Qualifier: Diagnosis of  By: Kem ParkinsonBarnes, Kimalexis    . CARDIOMYOPATHY, ISCHEMIC 10/20/2009    Qualifier: Diagnosis of  By: Graciela HusbandsKlein, MD, Ruthann CancerFACC, Ty HiltsSteven Cochran   . AUTOMATIC IMPLANTABLE CARDIAC DEFIBRILLATOR SITU 01/17/2010    Qualifier: Diagnosis of  By: Eden EmmsNishan, MD, Harrington ChallengerFACC, Peter Charles   . ASTHMA 09/20/2006    Qualifier: Diagnosis of  By: Jonny RuizJohn MD, Len BlalockJames W   . RENAL INSUFFICIENCY 09/18/2009    Qualifier: Diagnosis of  By: Kem ParkinsonBarnes, Kimalexis    . INSOMNIA-SLEEP DISORDER-UNSPEC 06/22/2007    Qualifier: Diagnosis of  By: Jonny RuizJohn MD, Len BlalockJames W   . HYPERCHOLESTEROLEMIA 09/18/2009    Qualifier: Diagnosis of  By: Kem ParkinsonBarnes, Kimalexis    . DEPRESSION 06/22/2007    Qualifier: Diagnosis of  By: Maris BergerSherwood, Elizabeth Ann   . Rhabdomyolysis 09/18/2009    Qualifier: Diagnosis of  By: Kem ParkinsonBarnes, Kimalexis    . RENAL CALCULUS, HX OF 09/20/2006   Qualifier: Diagnosis of  By: Jonny RuizJohn MD, Len BlalockJames W   . MIGRAINE HEADACHE 09/18/2009    Qualifier: Diagnosis of  By: Kem ParkinsonBarnes, Kimalexis    . CHOLELITHIASIS 06/22/2007    Qualifier: Diagnosis of  By: Maris BergerSherwood, Elizabeth Ann   . Impaired glucose tolerance 10/05/2012    No past surgical history on file.  Family History  Problem Relation Age of Onset  . Heart disease    . Diabetes    . Hypothyroidism      History   Social History  . Marital Status: Married    Spouse Name: N/A    Number of Children: 0  . Years of Education: N/A   Occupational History  . rei    Social History Main Topics  . Smoking status: Former Smoker -- 0.10 packs/day for 2 years    Quit date: 02/18/1994  . Smokeless tobacco: Never Used  . Alcohol Use: Yes  . Drug Use: No  . Sexual Activity: None   Other Topics Concern  . None   Social History Narrative  . None    Current outpatient prescriptions:aspirin 81 MG tablet, Take 81 mg by mouth daily. , Disp: , Rfl: ;  benzonatate (TESSALON) 100 MG capsule, Take 1 capsule (100 mg total) by mouth 2 (two) times daily as needed for cough., Disp: 20 capsule, Rfl: 0;  budesonide-formoterol (SYMBICORT) 80-4.5  MCG/ACT inhaler, INHALE 2 PUFFS INTO THE LUNGS TWICE DAILY AS NEEDED, Disp: 10.2 g, Rfl: 0 COREG CR 40 MG 24 hr capsule, TAKE ONE CAPSULE BY MOUTH DAILY, Disp: 30 capsule, Rfl: 0;  EFFIENT 10 MG TABS tablet, TAKE 1 TABLET BY MOUTH EVERY DAY, Disp: 30 tablet, Rfl: 0;  furosemide (LASIX) 40 MG tablet, TAKE 1 TABLET BY MOUTH DAILY, Disp: 30 tablet, Rfl: 0;  guaiFENesin (MUCINEX) 600 MG 12 hr tablet, Take 600 mg by mouth 2 (two) times daily., Disp: , Rfl: ;  lisinopril (PRINIVIL,ZESTRIL) 20 MG tablet, Take 20 mg by mouth daily., Disp: , Rfl:  loratadine (ALAVERT) 10 MG tablet, Take 10 mg by mouth as needed. , Disp: , Rfl: ;  metolazone (ZAROXOLYN) 5 MG tablet, TAKE AS NEEDED. NEED APPOINTMENT., Disp: 30 tablet, Rfl: 0;  mometasone (NASONEX) 50 MCG/ACT nasal spray, Place 2 sprays  into the nose daily. As needed, Disp: 17 g, Rfl: 5;  potassium chloride SA (K-DUR,KLOR-CON) 20 MEQ tablet, Take 1 tablet (20 mEq total) by mouth 2 (two) times daily., Disp: 60 tablet, Rfl: 11 simvastatin (ZOCOR) 20 MG tablet, Take 20 mg by mouth daily., Disp: , Rfl: ;  spironolactone (ALDACTONE) 25 MG tablet, TAKE 1/2 TABLET BY MOUTH EVERY DAY AS DIRECTED, Disp: 15 tablet, Rfl: 0;  temazepam (RESTORIL) 30 MG capsule, TAKE 1 CAPSULE BY MOUTH EVERY NIGHT AT BEDTIME AS NEEDED, Disp: 30 capsule, Rfl: 5;  tiZANidine (ZANAFLEX) 4 MG tablet, TAKE 1 TABLET BY MOUTH EVERY 6 HOURS AS NEEDED, Disp: 60 tablet, Rfl: 0 triamcinolone (NASACORT) 55 MCG/ACT nasal inhaler, PLACE 2 SPRAYS INTO THE NOSE DAILY, Disp: 16.5 Inhaler, Rfl: 0;  doxycycline (VIBRA-TABS) 100 MG tablet, Take 1 tablet (100 mg total) by mouth 2 (two) times daily., Disp: 20 tablet, Rfl: 0;  predniSONE (DELTASONE) 20 MG tablet, Take 2 tablets (40 mg total) by mouth daily with breakfast., Disp: 8 tablet, Rfl: 0  EXAM:  Filed Vitals:   06/10/13 1057  BP: 94/58  Pulse: 78  Temp: 99.7 F (37.6 C)    Body mass index is 30.42 kg/(m^2).  GENERAL: vitals reviewed and listed above, alert, oriented, appears well hydrated and in no acute distress  HEENT: atraumatic, conjunttiva clear, no obvious abnormalities on inspection of external nose and ears, normal appearance of ear canals and TMs, clear nasal congestion, mild post oropharyngeal erythema with PND, no tonsillar edema or exudate, no sinus TTP  NECK: no obvious masses on inspection  LUNGS: bilat difuse wheezing  CV: HRRR, no peripheral edema  MS: moves all extremities without noticeable abnormality  PSYCH: pleasant and cooperative, no obvious depression or anxiety  ASSESSMENT AND PLAN:  Discussed the following assessment and plan:  Ethmoid sinusitis - Plan: doxycycline (VIBRA-TABS) 100 MG tablet, DISCONTINUED: amoxicillin (AMOXIL) 875 MG tablet  ASTHMA - Plan: predniSONE (DELTASONE) 20  MG tablet, doxycycline (VIBRA-TABS) 100 MG tablet, DISCONTINUED: amoxicillin (AMOXIL) 875 MG tablet  Allergic rhinitis - Plan: mometasone (NASONEX) 50 MCG/ACT nasal spray  -of course, we advised to return or notify a doctor immediately if symptoms worsen or persist or new concerns arise.    Patient Instructions  -As we discussed, we have prescribed a new medication (PREDNISONE and DOXYCYCLINE) for you at this appointment. We discussed the common and serious potential adverse effects of this medication and you can review these and more with the pharmacist when you pick up your medication.  Please follow the instructions for use carefully and notify us immediately if you have any problems taking this  medication.  -follow up with your allergist or primary care doctor if not getting better     Terressa Koyanagi

## 2013-06-10 NOTE — Progress Notes (Signed)
Pre visit review using our clinic review tool, if applicable. No additional management support is needed unless otherwise documented below in the visit note. 

## 2013-06-10 NOTE — Patient Instructions (Addendum)
-  As we discussed, we have prescribed a new medication (PREDNISONE and DOXYCYCLINE) for you at this appointment. We discussed the common and serious potential adverse effects of this medication and you can review these and more with the pharmacist when you pick up your medication.  Please follow the instructions for use carefully and notify us immediately if you have any problems taking this medication.  -follow up with your allergist or primary care doctor if not getting better

## 2013-06-14 ENCOUNTER — Other Ambulatory Visit (INDEPENDENT_AMBULATORY_CARE_PROVIDER_SITE_OTHER): Payer: BC Managed Care – PPO

## 2013-06-14 ENCOUNTER — Ambulatory Visit (INDEPENDENT_AMBULATORY_CARE_PROVIDER_SITE_OTHER)
Admission: RE | Admit: 2013-06-14 | Discharge: 2013-06-14 | Disposition: A | Discharge status: Home / Self Care | Payer: BC Managed Care – PPO | Source: Ambulatory Visit | Attending: Internal Medicine | Admitting: Internal Medicine

## 2013-06-14 ENCOUNTER — Ambulatory Visit (INDEPENDENT_AMBULATORY_CARE_PROVIDER_SITE_OTHER): Payer: BC Managed Care – PPO | Admitting: Internal Medicine

## 2013-06-14 ENCOUNTER — Encounter: Payer: Self-pay | Admitting: Internal Medicine

## 2013-06-14 VITALS — BP 114/80 | HR 73 | Temp 97.9°F | Ht 74.0 in | Wt 230.0 lb

## 2013-06-14 DIAGNOSIS — I2589 Other forms of chronic ischemic heart disease: Secondary | ICD-10-CM

## 2013-06-14 DIAGNOSIS — R05 Cough: Secondary | ICD-10-CM | POA: Insufficient documentation

## 2013-06-14 DIAGNOSIS — R059 Cough, unspecified: Secondary | ICD-10-CM | POA: Insufficient documentation

## 2013-06-14 DIAGNOSIS — J45909 Unspecified asthma, uncomplicated: Secondary | ICD-10-CM

## 2013-06-14 DIAGNOSIS — I255 Ischemic cardiomyopathy: Secondary | ICD-10-CM

## 2013-06-14 LAB — CBC WITH DIFFERENTIAL/PLATELET
BASOS PCT: 0.2 % (ref 0.0–3.0)
Basophils Absolute: 0 10*3/uL (ref 0.0–0.1)
EOS PCT: 0.1 % (ref 0.0–5.0)
Eosinophils Absolute: 0 10*3/uL (ref 0.0–0.7)
HEMATOCRIT: 41.5 % (ref 39.0–52.0)
HEMOGLOBIN: 13.7 g/dL (ref 13.0–17.0)
LYMPHS ABS: 2 10*3/uL (ref 0.7–4.0)
Lymphocytes Relative: 13.9 % (ref 12.0–46.0)
MCHC: 33.1 g/dL (ref 30.0–36.0)
MCV: 82.4 fl (ref 78.0–100.0)
MONOS PCT: 10 % (ref 3.0–12.0)
Monocytes Absolute: 1.4 10*3/uL — ABNORMAL HIGH (ref 0.1–1.0)
Neutro Abs: 10.8 10*3/uL — ABNORMAL HIGH (ref 1.4–7.7)
Neutrophils Relative %: 75.8 % (ref 43.0–77.0)
PLATELETS: 309 10*3/uL (ref 150.0–400.0)
RBC: 5.03 Mil/uL (ref 4.22–5.81)
RDW: 13.5 % (ref 11.5–14.6)
WBC: 14.3 10*3/uL — AB (ref 4.5–10.5)

## 2013-06-14 LAB — BASIC METABOLIC PANEL
BUN: 17 mg/dL (ref 6–23)
CALCIUM: 9.6 mg/dL (ref 8.4–10.5)
CO2: 31 mEq/L (ref 19–32)
CREATININE: 0.9 mg/dL (ref 0.4–1.5)
Chloride: 97 mEq/L (ref 96–112)
GFR: 106.16 mL/min (ref >60.00)
GLUCOSE: 89 mg/dL (ref 70–99)
Potassium: 3.7 mEq/L (ref 3.5–5.1)
Sodium: 138 mEq/L (ref 135–145)

## 2013-06-14 LAB — HEPATIC FUNCTION PANEL
ALBUMIN: 4 g/dL (ref 3.5–5.2)
ALT: 18 U/L (ref 0–53)
AST: 16 U/L (ref 0–37)
Alkaline Phosphatase: 68 U/L (ref 39–117)
BILIRUBIN TOTAL: 0.4 mg/dL (ref 0.3–1.2)
Bilirubin, Direct: 0.1 mg/dL (ref 0.0–0.3)
Total Protein: 8.2 g/dL (ref 6.0–8.3)

## 2013-06-14 LAB — LIPID PANEL
CHOLESTEROL: 175 mg/dL (ref 0–200)
HDL: 41.8 mg/dL (ref >39.00)
LDL CALC: 115 mg/dL — AB (ref 0–99)
TRIGLYCERIDES: 90 mg/dL (ref 0.0–149.0)
Total CHOL/HDL Ratio: 4
VLDL: 18 mg/dL (ref 0.0–40.0)

## 2013-06-14 MED ORDER — ALBUTEROL SULFATE HFA 108 (90 BASE) MCG/ACT IN AERS: INHALATION_SPRAY | RESPIRATORY_TRACT | Status: DC

## 2013-06-14 MED ORDER — PREDNISONE 10 MG PO TABS: ORAL_TABLET | ORAL | Status: DC

## 2013-06-14 MED ORDER — OLMESARTAN MEDOXOMIL-HCTZ 20-12.5 MG PO TABS: 1.0000 | ORAL_TABLET | Freq: Every day | ORAL | Status: DC

## 2013-06-14 NOTE — Patient Instructions (Signed)
Stop lisinopril  Start Benicar 20/12.5 mg one daily   Go ahead and take the doxy  Prednisone 10 mg take  4 each am x 2 days,   2 each am x 2 days,  1 each am x 2 days and stop   Continue symbicort 80 Take 2 puffs first thing in am and then another 2 puffs about 12 hours later.   For breathing Only use your albuterol (proair) as a rescue medication to be used if you can't catch your breath by resting or doing a relaxed purse lip breathing pattern.  - The less you use it, the better it will work when you need it. - Ok to use up to 1 puffs  every 4 hours if you must but call for immediate appointment if use goes up over your usual need - Don't leave home without it !!  (think of it like the spare tire for your car)   For cough - tramadol 50 mg one - two every 4 hours as needed  Please remember to go to the x-ray department downstairs for your tests - we will call you with the results when they are available.     Please schedule a follow up office visit in 2 weeks, sooner if needed > bring all meds in 2 bags

## 2013-06-14 NOTE — Assessment & Plan Note (Addendum)
The most common causes of chronic cough in immunocompetent adults include the following: upper airway cough syndrome (UACS), previously referred to as postnasal drip syndrome (PNDS), which is caused by variety of rhinosinus conditions; (2) asthma; (3) GERD; (4) chronic bronchitis from cigarette smoking or other inhaled environmental irritants; (5) nonasthmatic eosinophilic bronchitis; and (6) bronchiectasis.   These conditions, singly or in combination, have accounted for up to 94% of the causes of chronic cough in prospective studies.   Other conditions have constituted no >6% of the causes in prospective studies These have included bronchogenic carcinoma, chronic interstitial pneumonia, sarcoidosis, left ventricular failure, ACEI-induced cough, and aspiration from a condition associated with pharyngeal dysfunction.    Chronic cough is often simultaneously caused by more than one condition. A single cause has been found from 38 to 82% of the time, multiple causes from 18 to 62%. Multiply caused cough has been the result of three diseases up to 42% of the time.      This is almost certainly  Classic Upper airway cough syndrome, so named because it's frequently impossible to sort out how much is  CR/sinusitis with freq throat clearing (which can be related to primary GERD)   vs  causing  secondary (" extra esophageal")  GERD from wide swings in gastric pressure that occur with throat clearing, often  promoting self use of mint and menthol lozenges that reduce the lower esophageal sphincter tone and exacerbate the problem further in a cyclical fashion.   These are the same pts (now being labeled as having "irritable larynx syndrome" by some cough centers) who not infrequently have a history of having failed to tolerate ace inhibitors,  dry powder inhalers or biphosphonates or report having atypical reflux symptoms that don't respond to standard doses of PPI , and are easily confused as having aecopd or  asthma flares by even experienced allergists/ pulmonologists.   For now try off ACEi and rx possible rhinitis with doxy/prednisone x 5 days only then regroup in 2 weeks

## 2013-06-14 NOTE — Progress Notes (Signed)
Subjective:     Patient ID: Jason Hogan Jason Hogan, male   DOB: 09-03-1975  MRN: [id number]  HPI  38 yo [place] male minimal smoking hx with admit for acute ischemic cm / Jason Hogan edema req vent x 8 days ? 2011 then did ok with walking nl pace but ever since then can't get in a hurry or go up inclines s sob self referred back to [facility] 06/14/2013 for worsening sob in setting of refractory cough on acei     06/14/2013   Jason Jason Hogan office visit/ Jason Jason Hogan  On ACEi / coreg Chief Complaint  Patient presents with  . Acute Visit    Pt c/o increased cough x 2 months- worse x 2 wks and is prod with yellow to green and blood tinged sputum. He states started feeling SOB x 1  day ago and has had chest tighness. He had to use rescue inhaler x 4 yesterday.   mucus turned green/slt bloody  x 2 days but not back to baseline x sev months with daily cough to point of choking Usually prednisone helps but not this time.  No obvious day to day or daytime variabilty or assoc cp or subjective wheeze overt sinus or hb symptoms. No unusual exp hx or h/o childhood pna/ asthma or knowledge of premature birth.  Sleeping ok without nocturnal  or early am exacerbation  of respiratory  c/o's or need for noct saba. Also denies any obvious fluctuation of symptoms with weather or environmental changes or other aggravating or alleviating factors except as outlined above   Current Medications, Allergies, Complete Past Medical History, Past Surgical History, Family History, and Social History were reviewed in [employer] record.  ROS  The following are not active complaints unless bolded sore throat, dysphagia, dental problems, itching, sneezing,  nasal congestion or excess/ purulent secretions, ear ache,   fever, chills, sweats, unintended wt loss, pleuritic or exertional cp, hemoptysis,  orthopnea pnd or leg swelling, presyncope, palpitations, heartburn, abdominal pain, anorexia, nausea, vomiting,  diarrhea  or change in bowel or urinary habits, change in stools or urine, dysuria,hematuria,  rash, arthralgias, visual complaints, headache, numbness weakness or ataxia or problems with walking or coordination,  change in mood/affect or memory.         Review of Systems     Objective:   Physical Exam     Wt Readings from Last 3 Encounters:  06/14/13 230 lb (104.327 kg)  06/10/13 237 lb (107.502 kg)  06/03/13 231 lb (104.781 kg)     amb [place] male with prominent pseudowheeze  HEENT: nl dentition, turbinates, and orophanx. Nl external ear canals without cough reflex   NECK :  without JVD/Nodes/TM/ nl carotid upstrokes bilaterally   LUNGS: no acc muscle use, clear to A and P bilaterally without cough on insp or exp maneuvers   CV:  RRR  no s3 or murmur or increase in P2, no edema   ABD:  soft and nontender with nl excursion in the supine position. No bruits or organomegaly, bowel sounds nl  MS:  warm without deformities, calf tenderness, cyanosis or clubbing  SKIN: warm and dry without lesions    NEURO:  alert, approp, no deficits   CXR  06/14/2013 :  The heart size and mediastinal contours are stable. Single lead cardiac pacemaker is unchanged. There is no focal infiltrate, Jason Hogan edema, or pleural effusion. The visualized skeletal structures are stable.      Assessment:

## 2013-06-14 NOTE — Assessment & Plan Note (Addendum)
ACE inhibitors are problematic in  pts with airway complaints because  even experienced pulmonologists can't always distinguish ace effects from copd/asthma.  By themselves they don't actually cause a problem, much like oxygen can't by itself start a fire, but they certainly serve as a powerful catalyst or enhancer for any "fire"  or inflammatory process in the upper airway, be it caused by an ET  tube or more commonly reflux (especially in the obese or pts with known GERD or who are on biphoshonates).    In the era of ARB near equivalency until we have a better handle on the reversibility of the airway problem, it just makes sense to avoid ACEI  entirely in the short run and then decide later, having established a level of airway control using a reasonable limited regimen, whether to add back ace but even then being very careful to observe the pt for worsening airway control and number of meds used/ needed to control symptoms.    rec d/c acei > Start Benicar 20/12.5 mg one daily

## 2013-06-14 NOTE — Assessment & Plan Note (Signed)
DDX of  difficult airways managment all start with A and  include Adherence, Ace Inhibitors, Acid Reflux, Active Sinus Disease, Alpha 1 Antitripsin deficiency, Anxiety masquerading as Airways dz,  ABPA,  allergy(esp in young), Aspiration (esp in elderly), Adverse effects of DPI,  Active smokers, plus two Bs  = Bronchiectasis and Beta blocker use..and one C= CHF  Adherence is always the initial "prime suspect" and is a multilayered concern that requires a "trust but verify" approach in every patient - starting with knowing how to use medications, especially inhalers, correctly, keeping up with refills and understanding the fundamental difference between maintenance and prns vs those medications only taken for a very short course and then stopped and not refilled.  - The proper method of use, as well as anticipated side effects, of a metered-dose inhaler are discussed and demonstrated to the patient. Improved effectiveness after extensive coaching during this visit to a level of approximately  75% ok to continue symbicort 80 2bid and prn saba, reviewed  ? ACEi > see chf  ? Beta blocker effect > Strongly prefer in this setting: Bystolic, the most beta -1  selective Beta blocker available in sample form, with bisoprolol the most selective generic choice  on the market, but was place on this by Dr Graciela Husbands to control HR and since most of the problem is pseudowheeze will hold off change for now .  ? Active sinus / ? Allergy > doxy/pred x 6 d rx  See instructions for specific recommendations which were reviewed directly with the patient who was given a copy with highlighter outlining the key components.

## 2013-06-15 ENCOUNTER — Encounter: Payer: Self-pay | Admitting: Internal Medicine

## 2013-06-15 MED ORDER — TRAMADOL HCL 50 MG PO TABS: 50.0000 mg | ORAL_TABLET | ORAL | Status: DC | PRN

## 2013-06-22 ENCOUNTER — Encounter: Payer: Self-pay | Admitting: Internal Medicine

## 2013-06-22 NOTE — Telephone Encounter (Signed)
MW please advise on reaction to Doxy. Thanks.

## 2013-06-28 ENCOUNTER — Ambulatory Visit (INDEPENDENT_AMBULATORY_CARE_PROVIDER_SITE_OTHER): Payer: BC Managed Care – PPO | Admitting: Internal Medicine

## 2013-06-28 ENCOUNTER — Encounter: Payer: Self-pay | Admitting: Internal Medicine

## 2013-06-28 VITALS — BP 104/78 | HR 78 | Temp 98.1°F | Ht 74.0 in | Wt 233.6 lb

## 2013-06-28 DIAGNOSIS — R05 Cough: Secondary | ICD-10-CM

## 2013-06-28 DIAGNOSIS — I2589 Other forms of chronic ischemic heart disease: Secondary | ICD-10-CM

## 2013-06-28 DIAGNOSIS — J45909 Unspecified asthma, uncomplicated: Secondary | ICD-10-CM

## 2013-06-28 DIAGNOSIS — R059 Cough, unspecified: Secondary | ICD-10-CM

## 2013-06-28 MED ORDER — OLMESARTAN MEDOXOMIL-HCTZ 20-12.5 MG PO TABS: 1.0000 | ORAL_TABLET | Freq: Every day | ORAL | Status: DC

## 2013-06-28 NOTE — Assessment & Plan Note (Signed)
All goals of chronic asthma control met including optimal function and elimination of symptoms with minimal need for rescue therapy.  Contingencies discussed in full including contacting this office immediately if not controlling the symptoms using the rule of two's.     rec trial of symbicort prn   The proper method of use, as well as anticipated side effects, of a metered-dose inhaler are discussed and demonstrated to the patient. Improved effectiveness after extensive coaching during this visit to a level of approximately  90%   See instructions for specific recommendations which were reviewed directly with the patient who was given a copy with highlighter outlining the key components.

## 2013-06-28 NOTE — Assessment & Plan Note (Signed)
-   d/c acei  06/14/2013 > resolved 06/28/2013

## 2013-06-28 NOTE — Patient Instructions (Addendum)
Ok to reduce the symbicort to 2 puffs every 12 hours if needed - clearly if you need the rescue proair you need to be on the symbicort 2 every 12   Reduce the benicar to one half daily   If breathing/ coughing worsen you may need to see your heart doctor for a trial on an alternative to coreg   Please schedule a follow up visit in 3 months but call sooner if needed

## 2013-06-28 NOTE — Assessment & Plan Note (Signed)
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

## 2013-06-28 NOTE — Progress Notes (Signed)
Subjective:     Patient ID: Len BlalockBinoy S Bartoszek, male   DOB: 30-Mar-1975  MRN: [id number]   Brief patient profile:  38 yo [place] male minimal smoking hx with admit for acute ischemic cm / pulmonary edema req vent x 8 days ? 2011 then did ok with walking nl pace but ever since then can't get in a hurry or go up inclines s sob self referred back to pulmonary clinic 06/14/2013 for worsening sob in setting of refractory cough on acei    History of Present Illness  06/14/2013   [facility] office visit/ Sam Wunschel  On ACEi / coreg Chief Complaint  Patient presents with  . Acute Visit    Pt c/o increased cough x 2 months- worse x 2 wks and is prod with yellow to green and blood tinged sputum. He states started feeling SOB x 1  day ago and has had chest tighness. He had to use rescue inhaler x 4 yesterday.   mucus turned green/slt bloody  x 2 days but not back to baseline x sev months with daily cough to point of choking Usually prednisone helps but not this time. rec Stop lisinopril Start Benicar 20/12.5 mg one daily  Go ahead and take the doxy Prednisone 10 mg take  4 each am x 2 days,   2 each am x 2 days,  1 each am x 2 days and stop  Continue symbicort 80 Take 2 puffs first thing in am and then another 2 puffs about 12 hours later.  For breathing Only use your albuterol (proair)   For cough - tramadol 50 mg one - two every 4 hours as needed    06/28/2013 f/u ov/Jacqualin Shirkey re: ? pseudoasthma Chief Complaint  Patient presents with  . Follow-up    Pt reports cough is much improved improved since last visit. He c/o dizziness upon standing since started benicar.      No need at all for saba and now questioning whether really needs symbicort  No need for tramadol   No obvious day to day or daytime variabilty or assoc cp or subjective wheeze overt sinus or hb symptoms. No unusual exp hx or h/o childhood pna/ asthma or knowledge of premature birth.  Sleeping ok without nocturnal  or early am  exacerbation  of respiratory  c/o's or need for noct saba. Also denies any obvious fluctuation of symptoms with weather or environmental changes or other aggravating or alleviating factors except as outlined above   Current Medications, Allergies, Complete Past Medical History, Past Surgical History, Family History, and Social History were reviewed in [facility] electronic [facility] record.  ROS  The following are not active complaints unless bolded sore throat, dysphagia, dental problems, itching, sneezing,  nasal congestion or excess/ purulent secretions, ear ache,   fever, chills, sweats, unintended wt loss, pleuritic or exertional cp, hemoptysis,  orthopnea pnd or leg swelling, presyncope, palpitations, heartburn, abdominal pain, anorexia, nausea, vomiting, diarrhea  or change in bowel or urinary habits, change in stools or urine, dysuria,hematuria,  rash, arthralgias, visual complaints, headache, numbness weakness or ataxia or problems with walking or coordination,  change in mood/affect or memory.               Objective:   Physical Exam    06/28/2013   234  Wt Readings from Last 3 Encounters:  06/14/13 230 lb (104.327 kg)  06/10/13 237 lb (107.502 kg)  06/03/13 231 lb (104.781 kg)     amb [place] male  with prominent pseudowheeze  HEENT: nl dentition, turbinates, and orophanx. Nl external ear canals without cough reflex   NECK :  without JVD/Nodes/TM/ nl carotid upstrokes bilaterally   LUNGS: no acc muscle use, clear to A and P bilaterally without cough on insp or exp maneuvers   CV:  RRR  no s3 or murmur or increase in P2, no edema   ABD:  soft and nontender with nl excursion in the supine position. No bruits or organomegaly, bowel sounds nl  MS:  warm without deformities, calf tenderness, cyanosis or clubbing  SKIN: warm and dry without lesions        CXR  06/14/2013 :  The heart size and mediastinal contours are stable. Single lead cardiac pacemaker is  unchanged. There is no focal infiltrate, pulmonary edema, or pleural effusion. The visualized skeletal structures are stable.      Assessment:

## 2013-07-02 ENCOUNTER — Other Ambulatory Visit: Payer: Self-pay | Admitting: Cardiovascular Disease

## 2013-07-02 ENCOUNTER — Other Ambulatory Visit: Payer: Self-pay | Admitting: Internal Medicine

## 2013-07-05 NOTE — Telephone Encounter (Signed)
Kim, should this be denied? Thanks, MI

## 2013-07-06 ENCOUNTER — Telehealth: Payer: Self-pay | Admitting: *Deleted

## 2013-07-06 NOTE — Telephone Encounter (Signed)
Patient needs refills, I have called him for appointment, left message on machine.

## 2013-07-09 ENCOUNTER — Other Ambulatory Visit: Payer: Self-pay

## 2013-07-09 MED ORDER — SPIRONOLACTONE 25 MG PO TABS: ORAL_TABLET | ORAL | Status: DC

## 2013-07-19 ENCOUNTER — Other Ambulatory Visit: Payer: Self-pay | Admitting: Internal Medicine

## 2013-07-19 ENCOUNTER — Encounter: Payer: Self-pay | Admitting: Cardiology

## 2013-07-20 ENCOUNTER — Telehealth: Payer: Self-pay | Admitting: Internal Medicine

## 2013-07-20 NOTE — Telephone Encounter (Signed)
07-19-13 sent past due device check letter certified/mt ° °

## 2013-09-24 ENCOUNTER — Other Ambulatory Visit: Payer: Self-pay | Admitting: Internal Medicine

## 2013-10-14 ENCOUNTER — Other Ambulatory Visit (INDEPENDENT_AMBULATORY_CARE_PROVIDER_SITE_OTHER): Payer: BC Managed Care – PPO

## 2013-10-14 ENCOUNTER — Ambulatory Visit (INDEPENDENT_AMBULATORY_CARE_PROVIDER_SITE_OTHER): Payer: BC Managed Care – PPO | Admitting: Internal Medicine

## 2013-10-14 ENCOUNTER — Encounter: Payer: Self-pay | Admitting: Internal Medicine

## 2013-10-14 VITALS — BP 114/60 | HR 81 | Temp 98.1°F | Wt 227.2 lb

## 2013-10-14 DIAGNOSIS — Z0000 Encounter for general adult medical examination without abnormal findings: Secondary | ICD-10-CM

## 2013-10-14 DIAGNOSIS — R7302 Impaired glucose tolerance (oral): Secondary | ICD-10-CM

## 2013-10-14 DIAGNOSIS — E7800 Pure hypercholesterolemia, unspecified: Secondary | ICD-10-CM

## 2013-10-14 DIAGNOSIS — R7309 Other abnormal glucose: Secondary | ICD-10-CM

## 2013-10-14 DIAGNOSIS — R7989 Other specified abnormal findings of blood chemistry: Secondary | ICD-10-CM

## 2013-10-14 DIAGNOSIS — G4700 Insomnia, unspecified: Secondary | ICD-10-CM

## 2013-10-14 DIAGNOSIS — Z293 Encounter for prophylactic fluoride administration: Secondary | ICD-10-CM

## 2013-10-14 HISTORY — DX: Other specified abnormal findings of blood chemistry: R79.89

## 2013-10-14 LAB — HEPATIC FUNCTION PANEL
ALT: 27 U/L (ref 0–53)
AST: 27 U/L (ref 0–37)
Albumin: 4.6 g/dL (ref 3.5–5.2)
Alkaline Phosphatase: 63 U/L (ref 39–117)
Bilirubin, Direct: 0.1 mg/dL (ref 0.0–0.3)
TOTAL PROTEIN: 8.5 g/dL — AB (ref 6.0–8.3)
Total Bilirubin: 0.9 mg/dL (ref 0.2–1.2)

## 2013-10-14 LAB — HEMOGLOBIN A1C: HEMOGLOBIN A1C: 5.4 % (ref 4.6–6.5)

## 2013-10-14 LAB — CBC WITH DIFFERENTIAL/PLATELET
Basophils Absolute: 0 10*3/uL (ref 0.0–0.1)
Basophils Relative: 0.5 % (ref 0.0–3.0)
EOS PCT: 2.3 % (ref 0.0–5.0)
Eosinophils Absolute: 0.2 10*3/uL (ref 0.0–0.7)
HEMATOCRIT: 44.7 % (ref 39.0–52.0)
Hemoglobin: 14.9 g/dL (ref 13.0–17.0)
LYMPHS ABS: 2.2 10*3/uL (ref 0.7–4.0)
Lymphocytes Relative: 31.4 % (ref 12.0–46.0)
MCHC: 33.3 g/dL (ref 30.0–36.0)
MCV: 81.9 fl (ref 78.0–100.0)
MONOS PCT: 12.7 % — AB (ref 3.0–12.0)
Monocytes Absolute: 0.9 10*3/uL (ref 0.1–1.0)
NEUTROS ABS: 3.8 10*3/uL (ref 1.4–7.7)
Neutrophils Relative %: 53.1 % (ref 43.0–77.0)
Platelets: 245 10*3/uL (ref 150.0–400.0)
RBC: 5.45 Mil/uL (ref 4.22–5.81)
RDW: 13.8 % (ref 11.5–15.5)
WBC: 7.1 10*3/uL (ref 4.0–10.5)

## 2013-10-14 LAB — LIPID PANEL
CHOLESTEROL: 237 mg/dL — AB (ref 0–200)
HDL: 38.3 mg/dL — AB (ref >39.00)
LDL Cholesterol: 159 mg/dL — ABNORMAL HIGH (ref 0–99)
NonHDL: 198.7
Total CHOL/HDL Ratio: 6
Triglycerides: 197 mg/dL — ABNORMAL HIGH (ref 0.0–149.0)
VLDL: 39.4 mg/dL (ref 0.0–40.0)

## 2013-10-14 LAB — BASIC METABOLIC PANEL
BUN: 20 mg/dL (ref 6–23)
CALCIUM: 9.7 mg/dL (ref 8.4–10.5)
CO2: 28 mEq/L (ref 19–32)
CREATININE: 1.2 mg/dL (ref 0.4–1.5)
Chloride: 95 mEq/L — ABNORMAL LOW (ref 96–112)
GFR: 69.47 mL/min (ref >60.00)
GLUCOSE: 90 mg/dL (ref 70–99)
Potassium: 3.4 mEq/L — ABNORMAL LOW (ref 3.5–5.1)
Sodium: 134 mEq/L — ABNORMAL LOW (ref 135–145)

## 2013-10-14 LAB — URINALYSIS, ROUTINE W REFLEX MICROSCOPIC
Bilirubin Urine: NEGATIVE
Hgb urine dipstick: NEGATIVE
KETONES UR: NEGATIVE
Leukocytes, UA: NEGATIVE
Nitrite: NEGATIVE
PH: 6 (ref 5.0–8.0)
RBC / HPF: NONE SEEN (ref 0–?)
SPECIFIC GRAVITY, URINE: 1.02 (ref 1.000–1.030)
URINE GLUCOSE: NEGATIVE
Urobilinogen, UA: 0.2 (ref 0.0–1.0)
WBC, UA: NONE SEEN (ref 0–?)

## 2013-10-14 LAB — TSH: TSH: 1.84 u[IU]/mL (ref 0.35–4.50)

## 2013-10-14 MED ORDER — TIZANIDINE HCL 4 MG PO TABS: ORAL_TABLET | ORAL | Status: DC

## 2013-10-14 MED ORDER — TRAZODONE HCL 50 MG PO TABS: 50.0000 mg | ORAL_TABLET | Freq: Every evening | ORAL | Status: DC | PRN

## 2013-10-14 MED ORDER — ATORVASTATIN CALCIUM 10 MG PO TABS: 10.0000 mg | ORAL_TABLET | Freq: Every day | ORAL | Status: DC

## 2013-10-14 NOTE — Assessment & Plan Note (Signed)
Mult med intol, but ok to try lipitor 10 mg

## 2013-10-14 NOTE — Patient Instructions (Addendum)
You had the flu shot today  OK to stop the zocor, and stop the restoril  Please take all new medication as prescribed - the lipitor 10 mg (generic), and trazodone for sleep as needed  Please continue all other medications as before, and refills have been done if requested.  Please have the pharmacy call with any other refills you may need.  Please continue your efforts at being more active, low cholesterol diet, and weight control.  You are otherwise up to date with prevention measures today.  Please keep your appointments with your specialists as you may have planned  Please go to the LAB in the Basement (turn left off the elevator) for the tests to be done today  You will be contacted by phone if any changes need to be made immediately.  Otherwise, you will receive a letter about your results with an explanation, but please check with MyChart first.  Please remember to sign up for MyChart if you have not done so, as this will be important to you in the future with finding out test results, communicating by private email, and scheduling acute appointments online when needed.  Please return in 6 months, or sooner if needed

## 2013-10-14 NOTE — Assessment & Plan Note (Signed)
For trazodone qhs prn °

## 2013-10-14 NOTE — Assessment & Plan Note (Signed)
Resolved with stopping ace

## 2013-10-14 NOTE — Progress Notes (Signed)
Pre visit review using our clinic review tool, if applicable. No additional management support is needed unless otherwise documented below in the visit note. 

## 2013-10-14 NOTE — Assessment & Plan Note (Signed)

## 2013-10-14 NOTE — Progress Notes (Signed)
Subjective:    Patient ID: Jason Hogan, male    DOB: Oct 08, 1975, 38 y.o.   MRN: [id number]  HPI  Here for wellness and f/u;  Overall doing ok;  Pt denies CP, worsening SOB, DOE, wheezing, orthopnea, PND, worsening LE edema, palpitations, dizziness or syncope.  Pt denies neurological change such as new headache, facial or extremity weakness.  Pt denies polydipsia, polyuria, or low sugar symptoms. Pt states overall good compliance with treatment and medications, good tolerability, and has been trying to follow lower cholesterol diet.  Pt denies worsening depressive symptoms, suicidal ideation or panic. No fever, night sweats, wt loss, loss of appetite, or other constitutional symptoms.  Pt states good ability with ADL's, has low fall risk, home safety reviewed and adequate, no other significant changes in hearing or vision, and only occasionally active with exercise.  Cough overall improved with change to benicar.  Did stop his zocor due to possible relation to ST memory problem, off now approx 1 mo, now not forgetful.   Willing to try lipitor 10. Could not tolerate crestor due to craving sweets. Restoril not working as well recently, asks for change. Has tried lunesta sample in past but had the bitter taste in AM.  Zanaflex works well for him.  Has not tried trazodone before for sleep.  Past Medical History  Diagnosis Date  . Myocardial infarction   . CAD (coronary artery disease)   . Hyperlipidemia   . Renal insufficiency   . Respiratory failure   . Rhabdomyolysis   . Hypercholesteremia   . Migraine headache   . Bacterial pneumonia   . Heart failure   . Depression   . Insomnia   . Elevated blood pressure   . Cholelithiasis   . Asthma   . Renal calculus   . Respiratory failure   . Neck pain     right  . URI (upper respiratory infection)   . Anxiety 10/03/2011  . Allergic rhinitis, cause unspecified 10/03/2011  . MYOCARDIAL INFARCTION 09/18/2009    Qualifier: Diagnosis of  By: Kem Parkinson    . Chronic systolic heart failure 09/18/2009    Qualifier: Diagnosis of  By: Kem Parkinson    . CARDIOMYOPATHY, ISCHEMIC 10/20/2009    Qualifier: Diagnosis of  By: Graciela Husbands, MD, Ruthann Cancer Ty Hilts   . 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 ASTHMA 09/20/2006    Qualifier: Diagnosis of  By: Jonny Ruiz MD, Len Blalock   . RENAL INSUFFICIENCY 09/18/2009    Qualifier: Diagnosis of  By: Kem Parkinson    . INSOMNIA-SLEEP DISORDER-UNSPEC 06/22/2007    Qualifier: Diagnosis of  By: Jonny Ruiz MD, Len Blalock   . HYPERCHOLESTEROLEMIA 09/18/2009    Qualifier: Diagnosis of  By: Kem Parkinson    . DEPRESSION 06/22/2007    Qualifier: Diagnosis of  By: Maris Berger   . Rhabdomyolysis 09/18/2009    Qualifier: Diagnosis of  By: Kem Parkinson    . RENAL CALCULUS, HX OF 09/20/2006    Qualifier: Diagnosis of  By: Jonny Ruiz MD, Len Blalock   . MIGRAINE HEADACHE 09/18/2009    Qualifier: Diagnosis of  By: Kem Parkinson    . CHOLELITHIASIS 06/22/2007    Qualifier: Diagnosis of  By: Maris Berger   . Impaired glucose tolerance 10/05/2012   No past surgical history on file.  reports that he quit smoking about 19 years ago. He has never used  smokeless tobacco. He reports that he drinks alcohol. He reports that he does not use illicit drugs. family history includes Diabetes in an other family member; Heart disease in an other family member; Hypothyroidism in an other family member. No Known Allergies Current Outpatient Prescriptions on File Prior to Visit  Medication Sig Dispense Refill  . albuterol (PROAIR HFA) 108 (90 BASE) MCG/ACT inhaler 2 puffs every 4 hours as needed only  if your can't catch your breath      . aspirin 81 MG tablet Take 81 mg by mouth daily.       . budesonide-formoterol (SYMBICORT) 80-4.5 MCG/ACT inhaler INHALE 2 PUFFS INTO THE LUNGS TWICE DAILY AS NEEDED  10.2 g  0  . cetirizine (ZYRTEC) 10 MG  tablet Take 10 mg by mouth daily.      . Coenzyme Q10 (CO Q 10) 100 MG CAPS Take 1 capsule by mouth daily.      [name] COREG CR 40 MG 24 hr capsule TAKE ONE CAPSULE BY MOUTH DAILY  30 capsule  0  . EFFIENT 10 MG TABS tablet TAKE 1 TABLET BY MOUTH EVERY DAY  30 tablet  0  . furosemide (LASIX) 40 MG tablet TAKE 1 TABLET BY MOUTH DAILY  30 tablet  0  . metolazone (ZAROXOLYN) 5 MG tablet TAKE AS NEEDED. NEED APPOINTMENT.  30 tablet  0  . mometasone (NASONEX) 50 MCG/ACT nasal spray Place 2 sprays into the nose daily. As needed  17 g  5  . Multiple Vitamins-Minerals (HAIR/SKIN/NAILS PO) Take 1 tablet by mouth daily.      [name] olmesartan-hydrochlorothiazide (BENICAR HCT) 20-12.5 MG per tablet Take 1 tablet by mouth daily.  30 tablet  11  . potassium chloride SA (K-DUR,KLOR-CON) 20 MEQ tablet Take 1 tablet (20 mEq total) by mouth 2 (two) times daily.  60 tablet  11  . simvastatin (ZOCOR) 20 MG tablet Take 20 mg by mouth daily.      [name] spironolactone (ALDACTONE) 25 MG tablet TAKE 1/2 TABLET BY MOUTH EVERY DAY AS DIRECTED  45 tablet  3  . temazepam (RESTORIL) 30 MG capsule TAKE 1 CAPSULE BY MOUTH EVERY NIGHT AT BEDTIME AS NEEDED  30 capsule  5  . tiZANidine (ZANAFLEX) 4 MG tablet TAKE 1 TABLET BY MOUTH EVERY 6 HOURS AS NEEDED  60 tablet  0   No current facility-administered medications on file prior to visit.      Review of Systems Constitutional: Negative for increased diaphoresis, other activity, appetite or other siginficant weight change  HENT: Negative for worsening hearing loss, ear pain, facial swelling, mouth sores and neck stiffness.   Eyes: Negative for other worsening pain, redness or visual disturbance.  Respiratory: Negative for shortness of breath and wheezing.   Cardiovascular: Negative for chest pain and palpitations.  Gastrointestinal: Negative for diarrhea, blood in stool, abdominal distention or other pain Genitourinary: Negative for hematuria, flank pain or change in urine volume.    Musculoskeletal: Negative for myalgias or other joint complaints.  Skin: Negative for color change and wound.  Neurological: Negative for syncope and numbness. other than noted Hematological: Negative for adenopathy. or other swelling Psychiatric/Behavioral: Negative for hallucinations, self-injury, decreased concentration or other worsening agitation.      Objective:   Physical Exam BP 114/60  Pulse 81  Temp(Src) 98.1 F (36.7 C) (Oral)  Wt 227 lb 4 oz (103.08 kg)  SpO2 94% VS noted,  Constitutional: Pt is oriented to person, place, and time. Appears well-developed and well-nourished.  Head: Normocephalic and atraumatic.  Right Ear: External ear normal.  Left Ear: External ear normal.  Nose: Nose normal.  Mouth/Throat: Oropharynx is clear and moist.  Eyes: Conjunctivae and EOM are normal. Pupils are equal, round, and reactive to light.  Neck: Normal range of motion. Neck supple. No JVD present. No tracheal deviation present.  Cardiovascular: Normal rate, regular rhythm, normal heart sounds and intact distal pulses.   Pulmonary/Chest: Effort normal and breath sounds without rales or wheezing  Abdominal: Soft. Bowel sounds are normal. NT. No HSM  Musculoskeletal: Normal range of motion. Exhibits no edema.  Lymphadenopathy:  Has no cervical adenopathy.  Neurological: Pt is alert and oriented to person, place, and time. Pt has normal reflexes. No cranial nerve deficit. Motor grossly intact Skin: Skin is warm and dry. No rash noted.  Psychiatric:  Has normal mood and affect. Behavior is normal.     Assessment & Plan:

## 2013-10-18 ENCOUNTER — Other Ambulatory Visit: Payer: Self-pay | Admitting: Internal Medicine

## 2013-10-23 ENCOUNTER — Encounter: Payer: Self-pay | Admitting: Internal Medicine

## 2013-11-12 ENCOUNTER — Other Ambulatory Visit: Payer: Self-pay | Admitting: Internal Medicine

## 2013-11-12 MED ORDER — TEMAZEPAM 15 MG PO CAPS: ORAL_CAPSULE | ORAL | Status: DC

## 2013-11-12 NOTE — Telephone Encounter (Signed)
zanaflex done erx  Temazepam already done earlier today

## 2013-11-12 NOTE — Telephone Encounter (Signed)
Done hardcopy to stephanie 

## 2013-11-16 MED ORDER — TRAZODONE HCL 50 MG PO TABS: ORAL_TABLET | ORAL | Status: DC

## 2013-11-16 NOTE — Telephone Encounter (Signed)
Trazodone rx done erx 

## 2013-11-17 ENCOUNTER — Encounter: Payer: Self-pay | Admitting: Internal Medicine

## 2013-11-18 ENCOUNTER — Encounter: Payer: Self-pay | Admitting: Physician Assistant

## 2013-11-18 DIAGNOSIS — G479 Sleep disorder, unspecified: Secondary | ICD-10-CM

## 2013-11-19 NOTE — Addendum Note (Signed)
Addended by: Corwin Levins on: 11/19/2013 01:00 PM   Modules accepted: Orders

## 2013-12-06 ENCOUNTER — Encounter: Payer: Self-pay | Admitting: Family Medicine

## 2013-12-06 ENCOUNTER — Other Ambulatory Visit (INDEPENDENT_AMBULATORY_CARE_PROVIDER_SITE_OTHER): Payer: BC Managed Care – PPO

## 2013-12-06 ENCOUNTER — Ambulatory Visit (INDEPENDENT_AMBULATORY_CARE_PROVIDER_SITE_OTHER): Payer: BC Managed Care – PPO | Admitting: Family Medicine

## 2013-12-06 VITALS — BP 110/82 | HR 77 | Ht 74.0 in | Wt 245.0 lb

## 2013-12-06 DIAGNOSIS — M79671 Pain in right foot: Secondary | ICD-10-CM | POA: Diagnosis not present

## 2013-12-06 DIAGNOSIS — M10271 Drug-induced gout, right ankle and foot: Secondary | ICD-10-CM

## 2013-12-06 DIAGNOSIS — M109 Gout, unspecified: Secondary | ICD-10-CM | POA: Insufficient documentation

## 2013-12-06 MED ORDER — ALLOPURINOL 100 MG PO TABS: 100.0000 mg | ORAL_TABLET | Freq: Every day | ORAL | Status: DC

## 2013-12-06 MED ORDER — OLMESARTAN MEDOXOMIL 20 MG PO TABS: 20.0000 mg | ORAL_TABLET | Freq: Every day | ORAL | Status: DC

## 2013-12-06 NOTE — Progress Notes (Signed)
[name] [address] [place], [place] [address] Phone: [phone] Subjective:    I'm seeing this patient by the request  of:  Oliver Barre, MD   CC: right foot swollen.   QAS:UORVIFBPPH Jason Hogan is a 38 y.o. male coming in with complaint of foot swelling. Patient states that over the course of the last 72 hours he started having increasing swelling as well as pain in the first toe on his right foot. Patient states that this seemed to come out of the blue and did not notice any type of injury. Patient was wearing new riding boots while he went on a motorcycle riding but did not have to stop at any point quickly. Patient states that the pain is a severe was waking him up at night even with very light sensation. Patient states that it is red and swollen and denies any break in his skin or any type of bite from any animal. Patient has had one change in his medications over the course last 2 weeks which includes a medication that does have hydrochlorothiazide. Patient did not have excessive alcohol intake but did have some seafood the night before this started hurting him. Patient rates the pain is 9/10. Not responding to home modalities. .     Past medical history, social, surgical and family history all reviewed in electronic medical record.   Review of Systems: No headache, visual changes, nausea, vomiting, diarrhea, constipation, dizziness, abdominal pain, skin rash, fevers, chills, night sweats, weight loss, swollen lymph nodes, body aches, joint swelling, muscle aches, chest pain, shortness of breath, mood changes.   Objective Blood pressure 110/82, pulse 77, height 6\' 2"  (1.88 m), weight 245 lb (111.131 kg), SpO2 96.00%.  General: No apparent distress alert and oriented x3 mood and affect normal, dressed appropriately.  HEENT: Pupils equal, extraocular movements intact  Respiratory: Patient's speak in full sentences and does not appear short of breath    Cardiovascular: No lower extremity edema, non tender, no erythema  Skin: Warm dry intact with no signs of infection or rash on extremities or on axial skeleton.  Abdomen: Soft nontender  Neuro: Cranial nerves II through XII are intact, neurovascularly intact in all extremities with 2+ DTRs and 2+ pulses.  Lymph: No lymphadenopathy of posterior or anterior cervical chain or axillae bilaterally.  Gait normal with good balance and coordination.  MSK:  Non tender with full range of motion and good stability and symmetric strength and tone of shoulders, elbows, wrist, hip, knee and ankles bilaterally.   right foot exam shows the patient does have a red swollen MTP joint. This is severely tender to palpation. Patient does have pes planus bilaterally. Patient is neurovascularly intact distally. Patient has good range of motion of the ankle and is nontender. No signs of skin breakdown.  Limited musculoskeletal ultrasound was performed and interpreted by me today. Limited ultrasound showed that patient does have a double line over the MTP joint which is consistent with crystal formation. Significant capsulitis noted as well.  Impression: Gout  After verbal consent patient was prepped with alcohol swabs and with a 25-gauge 1 inch needle was injected with 0.5 cc of 0.5% Marcaine and 1 cc of Kenalog 40 mg/dL into the MTP joint under ultrasound guidance. Patient tolerated the procedure well with near complete resolution of pain. Postinjection instructions given. Impression: Successful ultrasound guidance injection of the first MTP joint   Impression and Recommendations:     This case  required medical decision making of moderate complexity.

## 2013-12-06 NOTE — Assessment & Plan Note (Signed)
Discussed with patient at great length. Patient elected for more of the intra-articular injection in the systemic steroids. Patient tolerated the procedure well. In addition to this we discussed with wearing shoes that have a rigid sole to help try to avoid bending over the course of the next several days. We discussed other home modalities they can be beneficial. Patient also had changes and we did get rid of the hydrochlorothiazide giving patient only an ARB for his cardiomyopathy and can chronic systolic heart failure. Because we are making this change we also decreased his potassium supplementation to one pill daily. We discussed the patient does not make any significant improvement he'll come back sooner. We did also start him on allopurinol low dose of 100 mg daily and likely we'll not increase. We discussed about different nutrition diaphoresis and will be beneficial. Patient and will followup with me in 2 weeks just to make sure he continues to do better we may want to consider a basic metabolic panel.

## 2013-12-06 NOTE — Patient Instructions (Addendum)
Good to see you Tried the injection No longer can eat anything fun.  Allopurinol daily  Stop the benicar and decrease potassium to 1 pill daily.  Rigid sole shoes the next couple days.  See me again in 2 weeks.

## 2013-12-10 ENCOUNTER — Encounter: Payer: Self-pay | Admitting: Family Medicine

## 2013-12-20 ENCOUNTER — Encounter: Payer: Self-pay | Admitting: Family Medicine

## 2013-12-20 ENCOUNTER — Ambulatory Visit (INDEPENDENT_AMBULATORY_CARE_PROVIDER_SITE_OTHER): Payer: BC Managed Care – PPO | Admitting: Family Medicine

## 2013-12-20 VITALS — BP 116/70 | HR 86 | Ht 74.0 in | Wt 244.0 lb

## 2013-12-20 DIAGNOSIS — M10271 Drug-induced gout, right ankle and foot: Secondary | ICD-10-CM

## 2013-12-20 NOTE — Progress Notes (Signed)
  [name] [address] [place], [place] [address] Phone: [phone] Subjective:    I'm seeing this patient by the request  of:  Oliver Barre, MD   CC: right foot gout follow up   WCH:ENIDPOEUMP Jason Hogan is a 38 y.o. male coming in with complaint of foot swelling. Patient was seen previously and had dx of gout.  Patient was given an intra-articular injection as well as change medications including started on allopurinol and changed his diuretic. Patient states that he has improved by approximately 80%. Still having some dull discomfort of the first toe. States though that he has increasing range of motion. Patient is able to ambulate much better. Denies any new swelling. Some mild discoloration of the toe itself.no new symptoms.     Past medical history, social, surgical and family history all reviewed in electronic medical record.   Review of Systems: No headache, visual changes, nausea, vomiting, diarrhea, constipation, dizziness, abdominal pain, skin rash, fevers, chills, night sweats, weight loss, swollen lymph nodes, body aches, joint swelling, muscle aches, chest pain, shortness of breath, mood changes.   Objective Blood pressure 116/70, pulse 86, height 6\' 2"  (1.88 m), weight 244 lb (110.678 kg), SpO2 94 %.  General: No apparent distress alert and oriented x3 mood and affect normal, dressed appropriately.  HEENT: Pupils equal, extraocular movements intact  Respiratory: Patient's speak in full sentences and does not appear short of breath  Cardiovascular: No lower extremity edema, non tender, no erythema  Skin: Warm dry intact with no signs of infection or rash on extremities or on axial skeleton.  Abdomen: Soft nontender  Neuro: Cranial nerves II through XII are intact, neurovascularly intact in all extremities with 2+ DTRs and 2+ pulses.  Lymph: No lymphadenopathy of posterior or anterior cervical chain or axillae bilaterally.  Gait normal  with good balance and coordination.  MSK:  Non tender with full range of motion and good stability and symmetric strength and tone of shoulders, elbows, wrist, hip, knee and ankles bilaterally.   right foot exam shows significant decreased swelling of the MTP. Patient still have hallux rigidus compared to the contralateral side. Patient is still minimally tender to palpation. Neurovascularly intact distally.    Impression and Recommendations:     This case required medical decision making of moderate complexity.

## 2013-12-20 NOTE — Assessment & Plan Note (Signed)
Discussed with patient at this time. Patient will be started on a increasing dose of allopurinol. We'll do 200 mg daily. We'll continue to monitor closely. Patient will continue on his other blood pressure medications at this time. I am optimistic that patient will continue to improve. Patient has not notice any significant improvement over the course of next 2 weeks he'll come back for further evaluation.

## 2013-12-20 NOTE — Patient Instructions (Signed)
Good to see you Try the pennsaid twice daily and can try on shoulder Let me give you some exercises for shoulder.  Continue to wear rigid shoes a little bit longer.  Allopurinol 200mg  daily.  See me again in 2 weeks if shoulder is still hurting and we will look at it.

## 2014-01-10 ENCOUNTER — Ambulatory Visit: Payer: BC Managed Care – PPO | Admitting: Family Medicine

## 2014-01-15 ENCOUNTER — Encounter: Payer: Self-pay | Admitting: Family Medicine

## 2014-01-17 ENCOUNTER — Other Ambulatory Visit: Payer: Self-pay | Admitting: Family Medicine

## 2014-01-17 MED ORDER — ALLOPURINOL 100 MG PO TABS: 200.0000 mg | ORAL_TABLET | Freq: Every day | ORAL | Status: DC

## 2014-01-26 ENCOUNTER — Ambulatory Visit (INDEPENDENT_AMBULATORY_CARE_PROVIDER_SITE_OTHER): Payer: BC Managed Care – PPO | Admitting: Family Medicine

## 2014-01-26 ENCOUNTER — Encounter: Payer: Self-pay | Admitting: Family Medicine

## 2014-01-26 VITALS — BP 94/60 | HR 88 | Wt 248.0 lb

## 2014-01-26 DIAGNOSIS — M10271 Drug-induced gout, right ankle and foot: Secondary | ICD-10-CM

## 2014-01-26 MED ORDER — ALLOPURINOL 300 MG PO TABS: 300.0000 mg | ORAL_TABLET | Freq: Every day | ORAL | Status: DC

## 2014-01-26 MED ORDER — COLCHICINE 0.6 MG PO TABS: 0.6000 mg | ORAL_TABLET | Freq: Two times a day (BID) | ORAL | Status: DC

## 2014-01-26 NOTE — Assessment & Plan Note (Signed)
Discussed with patient again at great length. An secondary to patient's other comorbidities and medications that he needs to be on his going to have some what of a balancing intact. We discussed continuing the allopurinol 300 mg a new prescription was given. Patient was also given colchicine for any type of exacerbations and told proper dosing likely side effects. Patient will try to manage this acutely on his own. Patient has any exacerbation that does not seem to respond to a history of and he would come back for further evaluation. We discussed signs and symptoms and when to seek medical attention. Patient does have some renal insufficiency so colchicine prophylactically may not be the best medication as needed could consider Uloric

## 2014-01-26 NOTE — Progress Notes (Signed)
  [name] [address] [place], [place] [address] Phone: [phone] Subjective:    I'm seeing this patient by the request  of:  Oliver Barre, MD   CC: right foot gout follow up   FAO:ZHYQMVHQIO Jason Hogan is a 38 y.o. male coming in with complaint of foot swelling. Patient was diagnosed with gout. This is likely secondary to drug induced secondary to his congestive heart failure medications. Patient unfortunately did have an exacerbation while he was on 200 mg of allopurinol. Patient attempted to increase to 300 mg and an acute flare for 48 hours and then seemed to improve. Patient also notices when he stopped his diuretics it's improved tremendously but unfortunately had difficulty with breathing and had to restart the diuretics. Patient states that it is starting to feel better but once no what else he can possibly do.    Past medical history, social, surgical and family history all reviewed in electronic medical record.   Review of Systems: No headache, visual changes, nausea, vomiting, diarrhea, constipation, dizziness, abdominal pain, skin rash, fevers, chills, night sweats, weight loss, swollen lymph nodes, body aches, joint swelling, muscle aches, chest pain, shortness of breath, mood changes.   Objective Blood pressure 94/60, pulse 88, weight 248 lb (112.492 kg), SpO2 97 %.  General: No apparent distress alert and oriented x3 mood and affect normal, dressed appropriately.  HEENT: Pupils equal, extraocular movements intact  Respiratory: Patient's speak in full sentences and does not appear short of breath  Cardiovascular: No lower extremity edema, non tender, no erythema  Skin: Warm dry intact with no signs of infection or rash on extremities or on axial skeleton.  Abdomen: Soft nontender  Neuro: Cranial nerves II through XII are intact, neurovascularly intact in all extremities with 2+ DTRs and 2+ pulses.  Lymph: No lymphadenopathy of posterior  or anterior cervical chain or axillae bilaterally.  Gait normal with good balance and coordination.  MSK:  Non tender with full range of motion and good stability and symmetric strength and tone of shoulders, elbows, wrist, hip, knee and ankles bilaterally.   right foot exam swelling of the MTP Patient still have hallux rigidus compared to the contralateral side.  Neurovascularly intact distally. Continue to be moderately tender to palpation    Impression and Recommendations:     This case required medical decision making of moderate complexity.

## 2014-01-26 NOTE — Patient Instructions (Signed)
Good to see you Allopurinol 300mg  daily Colchicine 2 times daily for 5 days with flares Pennsaid up to 2 times daily.  Call me if you need anything.

## 2014-01-28 ENCOUNTER — Ambulatory Visit (INDEPENDENT_AMBULATORY_CARE_PROVIDER_SITE_OTHER): Payer: BC Managed Care – PPO | Admitting: Internal Medicine

## 2014-01-28 ENCOUNTER — Ambulatory Visit (INDEPENDENT_AMBULATORY_CARE_PROVIDER_SITE_OTHER): Payer: BC Managed Care – PPO

## 2014-01-28 VITALS — BP 104/70 | HR 84 | Temp 98.2°F | Resp 16 | Ht 73.0 in | Wt 244.0 lb

## 2014-01-28 DIAGNOSIS — S9002XA Contusion of left ankle, initial encounter: Secondary | ICD-10-CM

## 2014-01-28 DIAGNOSIS — M25572 Pain in left ankle and joints of left foot: Secondary | ICD-10-CM

## 2014-01-28 MED ORDER — HYDROCODONE-ACETAMINOPHEN 5-325 MG PO TABS: 1.0000 | ORAL_TABLET | Freq: Four times a day (QID) | ORAL | Status: DC | PRN

## 2014-01-28 NOTE — Progress Notes (Addendum)
Subjective:  This chart was scribed for Ellamae Sia, MD by Jarvis Morgan, Medical Scribe. This patient was seen in Room 9 and the patient's care was started at 3:19 PM.    Patient ID: Jason Hogan, male    DOB: 29-Jun-1975, 38 y.o.   MRN: [id number]  Chief Complaint  Patient presents with  . Ankle Injury    today, swelling, left    HPI HPI Comments: Jason Hogan is a 38 y.o. male who presents to the [facility] due to acrush injury to his left ankle that happened this morning. Pt is complaining of constant, moderate, left ankle pain. He states that a forklift backed over his left ankle. He is having associated swelling, bruising, and inability to put pressure on the ankle. He has not taken anything for the pain. He denies any numbness, weakness, or sensation loss.  Patient Active Problem List   Diagnosis Date Noted  . Gout 12/06/2013  . Insomnia 10/14/2013  . 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 Cough 06/14/2013  . Impaired glucose tolerance 10/05/2012  . Anxiety 10/03/2011  . Preventative health care 10/02/2011  . 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 CARDIOMYOPATHY, ISCHEMIC 10/20/2009  . HYPERCHOLESTEROLEMIA 09/18/2009  . MIGRAINE HEADACHE 09/18/2009  . MYOCARDIAL INFARCTION 09/18/2009  . Chronic systolic heart failure 09/18/2009  . RENAL INSUFFICIENCY 09/18/2009  . Rhabdomyolysis 09/18/2009  . ELEVATED BLOOD PRESSURE WITHOUT DIAGNOSIS OF HYPERTENSION 12/19/2008  . DEPRESSION 06/22/2007  . INSOMNIA-SLEEP DISORDER-UNSPEC 06/22/2007  . ASTHMA 09/20/2006  . RENAL CALCULUS, HX OF 09/20/2006   Past Medical History  Diagnosis Date  . Myocardial infarction   . CAD (coronary artery disease)   . Hyperlipidemia   . Renal insufficiency   . Respiratory failure   . Rhabdomyolysis   . Hypercholesteremia   . Migraine headache   . Bacterial pneumonia   . Heart failure   . Depression   . Insomnia   . Elevated blood pressure    . Cholelithiasis   . Asthma   . Renal calculus   . Respiratory failure   . Neck pain     right  . URI (upper respiratory infection)   . Anxiety 10/03/2011  . Allergic rhinitis, cause unspecified 10/03/2011  . MYOCARDIAL INFARCTION 09/18/2009    Qualifier: Diagnosis of  By: Kem Parkinson    . Chronic systolic heart failure 09/18/2009    Qualifier: Diagnosis of  By: Kem Parkinson    . CARDIOMYOPATHY, ISCHEMIC 10/20/2009    Qualifier: Diagnosis of  By: Graciela Husbands, MD, Ruthann Cancer Ty Hilts   . 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 ASTHMA 09/20/2006    Qualifier: Diagnosis of  By: Jonny Ruiz MD, Jason Hogan   . RENAL INSUFFICIENCY 09/18/2009    Qualifier: Diagnosis of  By: Kem Parkinson    . INSOMNIA-SLEEP DISORDER-UNSPEC 06/22/2007    Qualifier: Diagnosis of  By: Jonny Ruiz MD, Jason Hogan   . HYPERCHOLESTEROLEMIA 09/18/2009    Qualifier: Diagnosis of  By: Kem Parkinson    . DEPRESSION 06/22/2007    Qualifier: Diagnosis of  By: Maris Berger   . Rhabdomyolysis 09/18/2009    Qualifier: Diagnosis of  By: Kem Parkinson    . RENAL CALCULUS, HX OF 09/20/2006    Qualifier: Diagnosis of  By: Jonny Ruiz MD, Jason Hogan   . MIGRAINE HEADACHE 09/18/2009    Qualifier: Diagnosis of  By: Kem Parkinson    .  CHOLELITHIASIS 06/22/2007    Qualifier: Diagnosis of  By: Maris Berger   . Impaired glucose tolerance 10/05/2012  . Low testosterone 10/14/2013   No past surgical history on file. Allergies  Allergen Reactions  . Ace Inhibitors Cough  . Crestor [Rosuvastatin]     Sweet craving  . Lunesta [Eszopiclone]     Bitter taste  . Zocor [Simvastatin]     Memory problem   Prior to Admission medications   Medication Sig Start Date End Date Taking? Authorizing Provider  albuterol (PROAIR HFA) 108 (90 BASE) MCG/ACT inhaler 2 puffs every 4 hours as needed only  if your can't catch your breath 06/14/13  Yes Nyoka Cowden,  MD  allopurinol (ZYLOPRIM) 300 MG tablet Take 1 tablet (300 mg total) by mouth daily. 01/26/14  Yes Judi Saa, DO  aspirin 81 MG tablet Take 81 mg by mouth daily.    Yes Historical Provider, MD  atorvastatin (LIPITOR) 10 MG tablet Take 1 tablet (10 mg total) by mouth daily. 10/14/13  Yes Corwin Levins, MD  budesonide-formoterol (SYMBICORT) 80-4.5 MCG/ACT inhaler INHALE 2 PUFFS INTO THE LUNGS TWICE DAILY AS NEEDED 06/03/13  Yes Terressa Koyanagi, DO  cetirizine (ZYRTEC) 10 MG tablet Take 10 mg by mouth daily.   Yes Historical Provider, MD  Coenzyme Q10 (CO Q 10) 100 MG CAPS Take 1 capsule by mouth daily.   Yes Historical Provider, MD  colchicine (COLCRYS) 0.6 MG tablet Take 1 tablet (0.6 mg total) by mouth 2 (two) times daily. 01/26/14  Yes Zachary M Smith, DO  COREG CR 40 MG 24 hr capsule TAKE ONE CAPSULE BY MOUTH DAILY 11/30/12  Yes Duke Salvia, MD  EFFIENT 10 MG TABS tablet TAKE 1 TABLET BY MOUTH EVERY DAY 06/03/13  Yes Wendall Stade, MD  furosemide (LASIX) 40 MG tablet TAKE 1 TABLET BY MOUTH DAILY 12/29/12  Yes Wendall Stade, MD  metolazone (ZAROXOLYN) 5 MG tablet TAKE AS NEEDED. NEED APPOINTMENT. 05/03/13  Yes Wendall Stade, MD  mometasone (NASONEX) 50 MCG/ACT nasal spray Place 2 sprays into the nose daily. As needed 06/10/13  Yes Terressa Koyanagi, DO  Multiple Vitamins-Minerals (HAIR/SKIN/NAILS PO) Take 1 tablet by mouth daily.   Yes Historical Provider, MD  olmesartan (BENICAR) 20 MG tablet Take 1 tablet (20 mg total) by mouth daily. 12/06/13  Yes Judi Saa, DO  potassium chloride SA (K-DUR,KLOR-CON) 20 MEQ tablet TAKE 1 TABLET BY MOUTH TWICE DAILY 10/18/13  Yes Corwin Levins, MD  spironolactone (ALDACTONE) 25 MG tablet TAKE 1/2 TABLET BY MOUTH EVERY DAY AS DIRECTED 07/09/13  Yes Wendall Stade, MD  tiZANidine (ZANAFLEX) 4 MG tablet TAKE 1 TABLET BY MOUTH EVERY 6 HOURS AS NEEDED 11/12/13  Yes Corwin Levins, MD  traZODone (DESYREL) 50 MG tablet 1-2 tabs by mouth at bedtime as needed 11/16/13  Yes  Corwin Levins, MD   History   Social History  . Marital Status: Married    Spouse Name: N/A    Number of Children: 0  . Years of Education: N/A   Occupational History  . rei    Social History Main Topics  . Smoking status: Former Smoker -- 0.10 packs/day for 2 years    Quit date: 02/18/1994  . Smokeless tobacco: Never Used  . Alcohol Use: Yes  . Drug Use: No  . Sexual Activity: Not on file   Other Topics Concern  . Not on file   Social History Narrative  Review of Systems  All other systems reviewed and are negative.      Objective:   Physical Exam  Constitutional: He is oriented to person, place, and time. He appears well-developed and well-nourished. No distress.  HENT:  Head: Normocephalic and atraumatic.  Eyes: Conjunctivae and EOM are normal.  Neck: Neck supple.  Cardiovascular: Normal rate.   Pulmonary/Chest: Effort normal. No respiratory distress.  Musculoskeletal: He exhibits tenderness.  Abrasion with ecchymosis and swelling over the lateral malleolus. Tender to palpation and ROM  Neurological: He is alert and oriented to person, place, and time.  Skin: Skin is warm and dry.  Psychiatric: He has a normal mood and affect. His behavior is normal.  Nursing note and vitals reviewed.  Filed Vitals:   01/28/14 1442  BP: 104/70  Pulse: 84  Temp: 98.2 F (36.8 C)  Resp: 16   [facility] reading (PRIMARY) by  Dr. Merla Richesoolittle No ankle fracture identified     Assessment & Plan:  Pain in joint, ankle and foot, left - Plan: DG Ankle Complete Left  Contusion, ankle, left, initial encounter - Plan: DG Ankle Complete Left   Meds ordered this encounter  Medications  . HYDROcodone-acetaminophen (NORCO/VICODIN) 5-325 MG per tablet    Sig: Take 1 tablet by mouth every 6 (six) hours as needed for moderate pain.    Dispense:  20 tablet    Refill:  0   Ice 20min out of each hour next 24 Elevate Adv wt bea as tol  I personally performed the services described in  this documentation, which was scribed in my presence. The recorded information has been reviewed and is accurate.   I have completed the patient encounter in its entirety as documented by the scribe, with editing by me where necessary. Jabria Loos P. Merla Richesoolittle, M.D.

## 2014-01-31 ENCOUNTER — Other Ambulatory Visit: Payer: Self-pay | Admitting: Internal Medicine

## 2014-03-22 ENCOUNTER — Telehealth: Payer: Self-pay

## 2014-03-22 MED ORDER — MOMETASONE FURO-FORMOTEROL FUM 200-5 MCG/ACT IN AERO: 2.0000 | INHALATION_SPRAY | Freq: Two times a day (BID) | RESPIRATORY_TRACT | Status: DC

## 2014-03-22 NOTE — Telephone Encounter (Signed)
Ok for try change to [employer]

## 2014-03-22 NOTE — Telephone Encounter (Signed)
Symbicort is not a covered medication please offer alternative

## 2014-04-05 ENCOUNTER — Ambulatory Visit (INDEPENDENT_AMBULATORY_CARE_PROVIDER_SITE_OTHER): Payer: 59 | Admitting: Family Medicine

## 2014-04-05 VITALS — BP 120/90 | HR 82 | Temp 98.0°F | Resp 16 | Ht 73.0 in | Wt 252.0 lb

## 2014-04-05 DIAGNOSIS — B084 Enteroviral vesicular stomatitis with exanthem: Secondary | ICD-10-CM

## 2014-04-05 DIAGNOSIS — J029 Acute pharyngitis, unspecified: Secondary | ICD-10-CM

## 2014-04-05 LAB — POCT RAPID STREP A (OFFICE): Rapid Strep A Screen: NEGATIVE

## 2014-04-05 MED ORDER — HYDROCODONE-ACETAMINOPHEN 7.5-325 MG/15ML PO SOLN: 5.0000 mL | ORAL | Status: DC | PRN

## 2014-04-05 MED ORDER — FIRST-DUKES MOUTHWASH MT SUSP: 5.0000 mL | OROMUCOSAL | Status: DC | PRN

## 2014-04-05 NOTE — Progress Notes (Signed)
Subjective:    Patient ID: Jason Hogan, male    DOB: [birth year], 39 y.o.   MRN: [id number] This chart was scribed for Jason Sorenson, MD by Jason Hogan, Medical Scribe. This patient was seen in Room 5 and the patient's care was started at 10:09 AM.   Chief Complaint  Patient presents with  . Sore Throat    X3days  . Exposed to Hand foot and mouth    Child in school    HPI HPI Comments: Jason Hogan is a 39 y.o. male who presents to the [facility] complaining of gradual onset sore throat that started a 3-4 days ago. Patient also reports having painful blisters on his hands. He has had exposure to hand, foot and mouth disease; another child at his 8-year-old's daycare was noted to have hand, foot and mouth disease, although his child and his wife do not have any of the symptoms. The sore throat is described as a feeling of "razor blades going down his throat" when swallowing. He took some Nyquil last night which helped him sleep. Patient notes that he and some other family members have had a stomach bug recently, starting about two weeks ago. His appetite has not yet returned since then. He also notes having rhinorrhea and dry cough before his sore throat started. His 81-month-old child had rhinorrhea first, so the patient initially had concern for strep throat contracted from his child. Patient denies fever.  Past Medical History  Diagnosis Date  . Myocardial infarction   . CAD (coronary artery disease)   . Hyperlipidemia   . Renal insufficiency   . Respiratory failure   . Rhabdomyolysis   . Hypercholesteremia   . Migraine headache   . Bacterial pneumonia   . Heart failure   . Depression   . Insomnia   . Elevated blood pressure   . Cholelithiasis   . Asthma   . Renal calculus   . Respiratory failure   . Neck pain     right  . URI (upper respiratory infection)   . Anxiety 10/03/2011  . Allergic rhinitis, cause unspecified 10/03/2011  . MYOCARDIAL INFARCTION  09/18/2009    Qualifier: Diagnosis of  By: Kem Parkinson    . Chronic systolic heart failure 09/18/2009    Qualifier: Diagnosis of  By: Kem Parkinson    . CARDIOMYOPATHY, ISCHEMIC 10/20/2009    Qualifier: Diagnosis of  By: Graciela Husbands, MD, Ruthann Cancer Ty Hilts   . 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 ASTHMA 09/20/2006    Qualifier: Diagnosis of  By: Jonny Ruiz MD, Len Blalock   . RENAL INSUFFICIENCY 09/18/2009    Qualifier: Diagnosis of  By: Kem Parkinson    . INSOMNIA-SLEEP DISORDER-UNSPEC 06/22/2007    Qualifier: Diagnosis of  By: Jonny Ruiz MD, Len Blalock   . HYPERCHOLESTEROLEMIA 09/18/2009    Qualifier: Diagnosis of  By: Kem Parkinson    . DEPRESSION 06/22/2007    Qualifier: Diagnosis of  By: Maris Berger   . Rhabdomyolysis 09/18/2009    Qualifier: Diagnosis of  By: Kem Parkinson    . RENAL CALCULUS, HX OF 09/20/2006    Qualifier: Diagnosis of  By: Jonny Ruiz MD, Len Blalock   . MIGRAINE HEADACHE 09/18/2009    Qualifier: Diagnosis of  By: Kem Parkinson    . CHOLELITHIASIS 06/22/2007    Qualifier: Diagnosis of  By: Maris Berger   .  Impaired glucose tolerance 10/05/2012  . Low testosterone 10/14/2013   Current Outpatient Prescriptions on File Prior to Visit  Medication Sig Dispense Refill  . albuterol (PROAIR HFA) 108 (90 BASE) MCG/ACT inhaler 2 puffs every 4 hours as needed only  if your can't catch your breath    . allopurinol (ZYLOPRIM) 300 MG tablet Take 1 tablet (300 mg total) by mouth daily. 90 tablet 1  . aspirin 81 MG tablet Take 81 mg by mouth daily.     [name] atorvastatin (LIPITOR) 10 MG tablet Take 1 tablet (10 mg total) by mouth daily. 90 tablet 3  . cetirizine (ZYRTEC) 10 MG tablet Take 10 mg by mouth daily.    . Coenzyme Q10 (CO Q 10) 100 MG CAPS Take 1 capsule by mouth daily.    . colchicine (COLCRYS) 0.6 MG tablet Take 1 tablet (0.6 mg total) by mouth 2 (two) times daily. 60 tablet 1  .  COREG CR 40 MG 24 hr capsule TAKE ONE CAPSULE BY MOUTH DAILY 30 capsule 0  . EFFIENT 10 MG TABS tablet TAKE 1 TABLET BY MOUTH EVERY DAY 30 tablet 0  . furosemide (LASIX) 40 MG tablet TAKE 1 TABLET BY MOUTH DAILY 30 tablet 0  . HYDROcodone-acetaminophen (NORCO/VICODIN) 5-325 MG per tablet Take 1 tablet by mouth every 6 (six) hours as needed for moderate pain. 20 tablet 0  . metolazone (ZAROXOLYN) 5 MG tablet TAKE AS NEEDED. NEED APPOINTMENT. 30 tablet 0  . mometasone (NASONEX) 50 MCG/ACT nasal spray Place 2 sprays into the nose daily. As needed 17 g 5  . mometasone-formoterol (DULERA) 200-5 MCG/ACT AERO Inhale 2 puffs into the lungs 2 (two) times daily. 3 Inhaler 3  . Multiple Vitamins-Minerals (HAIR/SKIN/NAILS PO) Take 1 tablet by mouth daily.    [name] olmesartan (BENICAR) 20 MG tablet Take 1 tablet (20 mg total) by mouth daily. 30 tablet 1  . potassium chloride SA (K-DUR,KLOR-CON) 20 MEQ tablet TAKE 1 TABLET BY MOUTH TWICE DAILY 180 tablet 3  . spironolactone (ALDACTONE) 25 MG tablet TAKE 1/2 TABLET BY MOUTH EVERY DAY AS DIRECTED 45 tablet 3  . tiZANidine (ZANAFLEX) 4 MG tablet TAKE 1 TABLET BY MOUTH EVERY 6 HOURS AS NEEDED 60 tablet 0  . traZODone (DESYREL) 50 MG tablet 1-2 tabs by mouth at bedtime as needed 180 tablet 1   No current facility-administered medications on file prior to visit.   Allergies  Allergen Reactions  . Ace Inhibitors Cough  . Crestor [Rosuvastatin]     Sweet craving  . Lunesta [Eszopiclone]     Bitter taste  . Zocor [Simvastatin]     Memory problem     Review of Systems  Constitutional: Positive for appetite change. Negative for fever.  HENT: Positive for rhinorrhea and sore throat.   Respiratory: Positive for cough.   Skin: Positive for rash.       Objective:  BP 120/90 mmHg  Pulse 82  Temp(Src) 98 F (36.7 C) (Oral)  Resp 16  Ht  (1.854 m)  Wt 252 lb (114.306 kg)  BMI 33.25 kg/m2  SpO2 97%  Physical Exam  Constitutional: He is oriented to  person, place, and time. He appears well-developed and well-nourished. No distress.  HENT:  Head: Normocephalic and atraumatic.  Right Ear: A middle ear effusion is present.  Left Ear: A middle ear effusion is present.  Nose: Mucosal edema present.  Mouth/Throat: Oropharyngeal exudate and posterior oropharyngeal edema present.  Petechiae and erythemic ulcerations over soft palate with  2+ tonsillar edema, with some tonsillar exudates.  Eyes: Pupils are equal, round, and reactive to light.  Neck: Neck supple.  Cardiovascular: Normal rate, regular rhythm, S1 normal, S2 normal and normal heart sounds.   No murmur heard. Pulmonary/Chest: Effort normal.  Musculoskeletal: He exhibits no edema.  Lymphadenopathy:       Head (right side): Submandibular and tonsillar adenopathy present.       Head (left side): Submandibular and tonsillar adenopathy present.    He has cervical adenopathy.  Anterior cervical adenopathy.  Neurological: He is alert and oriented to person, place, and time. No cranial nerve deficit.  Skin: Skin is warm and dry. No rash noted.  Psychiatric: He has a normal mood and affect. His behavior is normal.  Vitals reviewed.         Assessment & Plan:   Acute pharyngitis, unspecified pharyngitis type - Plan: POCT rapid strep A, Culture, Group A Strep  Hand, foot and mouth disease - textbook symptoms with tiny blisters over palms, soles, and soft palate.  Reviewed contact/contagious guidelines and symptomatic care.  Meds ordered this encounter  Medications  . HYDROcodone-acetaminophen (HYCET) 7.5-325 mg/15 ml solution    Sig: Take 5-10 mLs by mouth every 4 (four) hours as needed for moderate pain.    Dispense:  140 mL    Refill:  0  . Diphenhyd-Hydrocort-Nystatin (FIRST-DUKES MOUTHWASH) SUSP    Sig: Use as directed 5 mLs in the mouth or throat every 2 (two) hours as needed (sore throat).    Dispense:  237 mL    Refill:  0    Mix diphenhydramine, viscous lidocaine, and  hydrocortisone liquids in a 1:1:1 ratio please.  No nystatin mix, thanks. Disp 1 large bottle. Swish and spit.    I personally performed the services described in this documentation, which was scribed in my presence. The recorded information has been reviewed and considered, and addended by me as needed.  Jason Sorenson, MD MPH  Results for orders placed or performed in visit on 04/05/14  Culture, Group A Strep  Result Value Ref Range   Organism ID, Bacteria Normal Upper Respiratory Flora    Organism ID, Bacteria No Beta Hemolytic Streptococci Isolated   POCT rapid strep A  Result Value Ref Range   Rapid Strep A Screen Negative Negative

## 2014-04-05 NOTE — Patient Instructions (Signed)

## 2014-04-07 LAB — CULTURE, GROUP A STREP: ORGANISM ID, BACTERIA: NORMAL

## 2014-04-19 ENCOUNTER — Other Ambulatory Visit (INDEPENDENT_AMBULATORY_CARE_PROVIDER_SITE_OTHER): Payer: 59

## 2014-04-19 ENCOUNTER — Ambulatory Visit (INDEPENDENT_AMBULATORY_CARE_PROVIDER_SITE_OTHER): Payer: 59 | Admitting: Internal Medicine

## 2014-04-19 VITALS — BP 118/76 | HR 105 | Temp 98.3°F | Ht 73.0 in | Wt 243.0 lb

## 2014-04-19 DIAGNOSIS — E7800 Pure hypercholesterolemia, unspecified: Secondary | ICD-10-CM

## 2014-04-19 DIAGNOSIS — R109 Unspecified abdominal pain: Secondary | ICD-10-CM | POA: Insufficient documentation

## 2014-04-19 DIAGNOSIS — J22 Unspecified acute lower respiratory infection: Secondary | ICD-10-CM | POA: Insufficient documentation

## 2014-04-19 DIAGNOSIS — R000 Tachycardia, unspecified: Secondary | ICD-10-CM | POA: Insufficient documentation

## 2014-04-19 DIAGNOSIS — Z0189 Encounter for other specified special examinations: Secondary | ICD-10-CM

## 2014-04-19 DIAGNOSIS — J988 Other specified respiratory disorders: Secondary | ICD-10-CM

## 2014-04-19 DIAGNOSIS — R1031 Right lower quadrant pain: Secondary | ICD-10-CM

## 2014-04-19 DIAGNOSIS — G479 Sleep disorder, unspecified: Secondary | ICD-10-CM

## 2014-04-19 DIAGNOSIS — Z0000 Encounter for general adult medical examination without abnormal findings: Secondary | ICD-10-CM

## 2014-04-19 DIAGNOSIS — R7302 Impaired glucose tolerance (oral): Secondary | ICD-10-CM

## 2014-04-19 LAB — CBC WITH DIFFERENTIAL/PLATELET
Basophils Absolute: 0 10*3/uL (ref 0.0–0.1)
Basophils Relative: 0.4 % (ref 0.0–3.0)
Eosinophils Absolute: 0.2 10*3/uL (ref 0.0–0.7)
Eosinophils Relative: 2.2 % (ref 0.0–5.0)
HEMATOCRIT: 43.7 % (ref 39.0–52.0)
HEMOGLOBIN: 14.6 g/dL (ref 13.0–17.0)
LYMPHS ABS: 2.4 10*3/uL (ref 0.7–4.0)
LYMPHS PCT: 26.5 % (ref 12.0–46.0)
MCHC: 33.5 g/dL (ref 30.0–36.0)
MCV: 80.1 fl (ref 78.0–100.0)
MONOS PCT: 9.6 % (ref 3.0–12.0)
Monocytes Absolute: 0.9 10*3/uL (ref 0.1–1.0)
NEUTROS ABS: 5.5 10*3/uL (ref 1.4–7.7)
Neutrophils Relative %: 61.3 % (ref 43.0–77.0)
Platelets: 223 10*3/uL (ref 150.0–400.0)
RBC: 5.45 Mil/uL (ref 4.22–5.81)
RDW: 14.7 % (ref 11.5–15.5)
WBC: 9 10*3/uL (ref 4.0–10.5)

## 2014-04-19 LAB — HEPATIC FUNCTION PANEL
ALT: 24 U/L (ref 0–53)
AST: 22 U/L (ref 0–37)
Albumin: 4.5 g/dL (ref 3.5–5.2)
Alkaline Phosphatase: 74 U/L (ref 39–117)
BILIRUBIN TOTAL: 0.6 mg/dL (ref 0.2–1.2)
Bilirubin, Direct: 0.1 mg/dL (ref 0.0–0.3)
Total Protein: 8.2 g/dL (ref 6.0–8.3)

## 2014-04-19 LAB — URINALYSIS, ROUTINE W REFLEX MICROSCOPIC
Bilirubin Urine: NEGATIVE
Hgb urine dipstick: NEGATIVE
Ketones, ur: NEGATIVE
LEUKOCYTES UA: NEGATIVE
Nitrite: NEGATIVE
RBC / HPF: NONE SEEN (ref 0–?)
SPECIFIC GRAVITY, URINE: 1.015 (ref 1.000–1.030)
Total Protein, Urine: NEGATIVE
UROBILINOGEN UA: 0.2 (ref 0.0–1.0)
Urine Glucose: NEGATIVE
WBC UA: NONE SEEN (ref 0–?)
pH: 7 (ref 5.0–8.0)

## 2014-04-19 LAB — BASIC METABOLIC PANEL
BUN: 20 mg/dL (ref 6–23)
CHLORIDE: 96 meq/L (ref 96–112)
CO2: 34 mEq/L — ABNORMAL HIGH (ref 19–32)
Calcium: 10 mg/dL (ref 8.4–10.5)
Creatinine, Ser: 1.09 mg/dL (ref 0.40–1.50)
GFR: 80.39 mL/min (ref >60.00)
GLUCOSE: 79 mg/dL (ref 70–99)
POTASSIUM: 3.3 meq/L — AB (ref 3.5–5.1)
Sodium: 135 mEq/L (ref 135–145)

## 2014-04-19 LAB — LIPID PANEL
CHOL/HDL RATIO: 5
Cholesterol: 176 mg/dL (ref 0–200)
HDL: 35.7 mg/dL — ABNORMAL LOW (ref >39.00)
LDL Cholesterol: 106 mg/dL — ABNORMAL HIGH (ref 0–99)
NONHDL: 140.3
Triglycerides: 172 mg/dL — ABNORMAL HIGH (ref 0.0–149.0)
VLDL: 34.4 mg/dL (ref 0.0–40.0)

## 2014-04-19 LAB — TSH: TSH: 1.26 u[IU]/mL (ref 0.35–4.50)

## 2014-04-19 MED ORDER — AZITHROMYCIN 250 MG PO TABS: ORAL_TABLET | ORAL | Status: DC

## 2014-04-19 NOTE — Assessment & Plan Note (Addendum)
ECG reviewed as per emr, etiology unclear, ? Nerves,  recent data reviewed with pt, and pt to continue medical treatment as before,  to f/u any worsening symptoms or concerns Lab Results  Component Value Date   WBC 9.0 04/19/2014   HGB 14.6 04/19/2014   HCT 43.7 04/19/2014   PLT 223.0 04/19/2014   GLUCOSE 79 04/19/2014   CHOL 176 04/19/2014   TRIG 172.0* 04/19/2014   HDL 35.70* 04/19/2014   LDLDIRECT 102.3 10/05/2012   LDLCALC 106* 04/19/2014   ALT 24 04/19/2014   AST 22 04/19/2014   NA 135 04/19/2014   K 3.3* 04/19/2014   CL 96 04/19/2014   CREATININE 1.09 04/19/2014   BUN 20 04/19/2014   CO2 34* 04/19/2014   TSH 1.26 04/19/2014   INR 1.07 11/27/2009   HGBA1C 5.4 10/14/2013

## 2014-04-19 NOTE — Progress Notes (Signed)
Subjective:    Patient ID: Jason Hogan, male    DOB: May 27, 1975, 39 y.o.   MRN: [id number]  HPI here to f/u, overall doing fairly well,  2013 EF 45-50%. conts Sees [facility] cardiology. Pt denies chest pain, increased sob or doe, wheezing, orthopnea, PND, increased LE swelling, palpitations, or syncope, but has notice urine color change and tachycardia by his apple watch heart rate monitor x 2 wks.  Pt denies new neurological symptoms such as new headache, or facial or extremity weakness or numbness   Pt denies polydipsia, polyuria,  No overt bleeding. TSH normal aug 2015.  Takes diuretics.  Asks for labs as well. Also asks for GI referral for recurrring right side abd pain without f/c, n/v or bowel change.  Also asks for pulm referral for sleep eval, conts to have poor sleep efficiency, tends to cont to wake up after 3-4 hrs, tried mult meds recent without resolution.  Also  Here with 2-3 days acute onset fever, facial pain, pressure, headache, general weakness and malaise, and greenish d/c, with mild ST and cough  Past Medical History  Diagnosis Date  . Myocardial infarction   . CAD (coronary artery disease)   . Hyperlipidemia   . Renal insufficiency   . Respiratory failure   . Rhabdomyolysis   . Hypercholesteremia   . Migraine headache   . Bacterial pneumonia   . Heart failure   . Depression   . Insomnia   . Elevated blood pressure   . Cholelithiasis   . Asthma   . Renal calculus   . Respiratory failure   . Neck pain     right  . URI (upper respiratory infection)   . Anxiety 10/03/2011  . Allergic rhinitis, cause unspecified 10/03/2011  . MYOCARDIAL INFARCTION 09/18/2009    Qualifier: Diagnosis of  By: Kem Parkinson    . Chronic systolic heart failure 09/18/2009    Qualifier: Diagnosis of  By: Kem Parkinson    . CARDIOMYOPATHY, ISCHEMIC 10/20/2009    Qualifier: Diagnosis of  By: Graciela Husbands, MD, Ruthann Cancer Ty Hilts   . 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 ASTHMA 09/20/2006    Qualifier: Diagnosis of  By: Jonny Ruiz MD, Len Blalock   . RENAL INSUFFICIENCY 09/18/2009    Qualifier: Diagnosis of  By: Kem Parkinson    . INSOMNIA-SLEEP DISORDER-UNSPEC 06/22/2007    Qualifier: Diagnosis of  By: Jonny Ruiz MD, Len Blalock   . HYPERCHOLESTEROLEMIA 09/18/2009    Qualifier: Diagnosis of  By: Kem Parkinson    . DEPRESSION 06/22/2007    Qualifier: Diagnosis of  By: Maris Berger   . Rhabdomyolysis 09/18/2009    Qualifier: Diagnosis of  By: Kem Parkinson    . RENAL CALCULUS, HX OF 09/20/2006    Qualifier: Diagnosis of  By: Jonny Ruiz MD, Len Blalock   . MIGRAINE HEADACHE 09/18/2009    Qualifier: Diagnosis of  By: Kem Parkinson    . CHOLELITHIASIS 06/22/2007    Qualifier: Diagnosis of  By: Maris Berger   . Impaired glucose tolerance 10/05/2012  . Low testosterone 10/14/2013   No past surgical history on file.  reports that he quit smoking about 20 years ago. He has never used smokeless tobacco. He reports that he drinks alcohol. He reports that he does not use illicit drugs. family history includes Diabetes in an other family member; Heart disease in an other family member; Hypothyroidism in an  other family member. Allergies  Allergen Reactions  . Ace Inhibitors Cough  . Crestor [Rosuvastatin]     Sweet craving  . Lunesta [Eszopiclone]     Bitter taste  . Zocor [Simvastatin]     Memory problem   Current Outpatient Prescriptions on File Prior to Visit  Medication Sig Dispense Refill  . albuterol (PROAIR HFA) 108 (90 BASE) MCG/ACT inhaler 2 puffs every 4 hours as needed only  if your can't catch your breath    . allopurinol (ZYLOPRIM) 300 MG tablet Take 1 tablet (300 mg total) by mouth daily. 90 tablet 1  . aspirin 81 MG tablet Take 81 mg by mouth daily.     [name] atorvastatin (LIPITOR) 10 MG tablet Take 1 tablet (10 mg total) by mouth daily. 90 tablet 3  . cetirizine (ZYRTEC) 10 MG tablet Take  10 mg by mouth daily.    . Coenzyme Q10 (CO Q 10) 100 MG CAPS Take 1 capsule by mouth daily.    . colchicine (COLCRYS) 0.6 MG tablet Take 1 tablet (0.6 mg total) by mouth 2 (two) times daily. 60 tablet 1  . COREG CR 40 MG 24 hr capsule TAKE ONE CAPSULE BY MOUTH DAILY 30 capsule 0  . Diphenhyd-Hydrocort-Nystatin (FIRST-DUKES MOUTHWASH) SUSP Use as directed 5 mLs in the mouth or throat every 2 (two) hours as needed (sore throat). 237 mL 0  . EFFIENT 10 MG TABS tablet TAKE 1 TABLET BY MOUTH EVERY DAY 30 tablet 0  . furosemide (LASIX) 40 MG tablet TAKE 1 TABLET BY MOUTH DAILY 30 tablet 0  . HYDROcodone-acetaminophen (HYCET) 7.5-325 mg/15 ml solution Take 5-10 mLs by mouth every 4 (four) hours as needed for moderate pain. 140 mL 0  . HYDROcodone-acetaminophen (NORCO/VICODIN) 5-325 MG per tablet Take 1 tablet by mouth every 6 (six) hours as needed for moderate pain. 20 tablet 0  . metolazone (ZAROXOLYN) 5 MG tablet TAKE AS NEEDED. NEED APPOINTMENT. 30 tablet 0  . mometasone (NASONEX) 50 MCG/ACT nasal spray Place 2 sprays into the nose daily. As needed 17 g 5  . mometasone-formoterol (DULERA) 200-5 MCG/ACT AERO Inhale 2 puffs into the lungs 2 (two) times daily. 3 Inhaler 3  . Multiple Vitamins-Minerals (HAIR/SKIN/NAILS PO) Take 1 tablet by mouth daily.    [name] olmesartan (BENICAR) 20 MG tablet Take 1 tablet (20 mg total) by mouth daily. 30 tablet 1  . potassium chloride SA (K-DUR,KLOR-CON) 20 MEQ tablet TAKE 1 TABLET BY MOUTH TWICE DAILY 180 tablet 3  . spironolactone (ALDACTONE) 25 MG tablet TAKE 1/2 TABLET BY MOUTH EVERY DAY AS DIRECTED 45 tablet 3  . tiZANidine (ZANAFLEX) 4 MG tablet TAKE 1 TABLET BY MOUTH EVERY 6 HOURS AS NEEDED 60 tablet 0  . traZODone (DESYREL) 50 MG tablet 1-2 tabs by mouth at bedtime as needed 180 tablet 1   No current facility-administered medications on file prior to visit.    Review of Systems  Constitutional: Negative for unusual diaphoresis or other sweats  HENT: Negative  for ringing in ear Eyes: Negative for double vision or worsening visual disturbance.  Respiratory: Negative for choking and stridor.   Gastrointestinal: Negative for vomiting or other signifcant bowel change Genitourinary: Negative for hematuria or decreased urine volume.  Musculoskeletal: Negative for other MSK pain or swelling Skin: Negative for color change and worsening wound.  Neurological: Negative for tremors and numbness other than noted  Psychiatric/Behavioral: Negative for decreased concentration or agitation other than above       Objective:  Physical Exam BP 118/76 mmHg  Pulse 105  Temp(Src) 98.3 F (36.8 C) (Oral)  Ht 6\' 1"  (1.854 m)  Wt 243 lb (110.224 kg)  BMI 32.07 kg/m2  SpO2 95% VS noted, including orthostatics as documented - not orthotatic Constitutional: Pt appears well-developed, well-nourished.  HENT: Head: NCAT.  Right Ear: External ear normal.  Left Ear: External ear normal.  Bilat tm's with mild erythema.  Max sinus areas mild tender.  Pharynx with mild erythema, no exudate Eyes: . Pupils are equal, round, and reactive to light. Conjunctivae and EOM are normal Neck: Normal range of motion. Neck supple.  Cardiovascular: Normal rate and regular rhythm.   Pulmonary/Chest: Effort normal and breath sounds without rales or wheezing.  Abd:  Soft, NT, ND, + BS Neurological: Pt is alert. Not confused , motor grossly intact Skin: Skin is warm. No rash Psychiatric: Pt behavior is normal. No agitation.     Assessment & Plan:

## 2014-04-19 NOTE — Progress Notes (Signed)
Pre visit review using our clinic review tool, if applicable. No additional management support is needed unless otherwise documented below in the visit note. 

## 2014-04-19 NOTE — Assessment & Plan Note (Signed)
stable overall by history and exam, recent data reviewed with pt, and pt to continue medical treatment as before,  to f/u any worsening symptoms or concerns Lab Results  Component Value Date   HGBA1C 5.4 10/14/2013    

## 2014-04-19 NOTE — Patient Instructions (Addendum)
You do not appear to have significant lower blood pressure today with standing  Please take all new medication as prescribed - the antibiotic  Please continue all other medications as before, and refills have been done if requested.  Please have the pharmacy call with any other refills you may need.  Please continue your efforts at being more active, low cholesterol diet, and weight control.  You are otherwise up to date with prevention measures today.  Please keep your appointments with your specialists as you may have planned  You will be contacted regarding the referral for: GI, and Pulmonary  Please go to the LAB in the Basement (turn left off the elevator) for the tests to be done today  You will be contacted by phone if any changes need to be made immediately.  Otherwise, you will receive a letter about your results with an explanation, but please check with MyChart first.  Please return in 6 months, or sooner if needed, with Lab testing done 3-5 days before

## 2014-04-19 NOTE — Assessment & Plan Note (Addendum)
Mild to mod, for antibx course,  to f/u any worsening symptoms or concerns  Note:  Total time for pt hx, exam, review of record with pt in the room, determination of diagnoses and plan for further eval and tx is > 40 min, with over 50% spent in coordination and counseling of patient 

## 2014-04-19 NOTE — Assessment & Plan Note (Signed)
Recurring, ok for referral GI as requested

## 2014-04-19 NOTE — Assessment & Plan Note (Addendum)
Ok refer to pulm sleep eval per pt request

## 2014-04-19 NOTE — Assessment & Plan Note (Signed)
Uncontrolled, for lipid s as well as he is now on lipitor, .o/w stable overall by history and exam, recent data reviewed with pt, and pt to continue medical treatment as before,  to f/u any worsening symptoms or concerns Lab Results  Component Value Date   LDLCALC 159* 10/14/2013

## 2014-04-20 ENCOUNTER — Other Ambulatory Visit: Payer: Self-pay | Admitting: Family Medicine

## 2014-04-21 NOTE — Telephone Encounter (Signed)
Refill done.  

## 2014-07-04 ENCOUNTER — Other Ambulatory Visit: Payer: Self-pay | Admitting: Internal Medicine

## 2014-07-20 ENCOUNTER — Other Ambulatory Visit: Payer: Self-pay | Admitting: Cardiovascular Disease

## 2014-07-25 ENCOUNTER — Telehealth: Payer: Self-pay | Admitting: *Deleted

## 2014-07-25 MED ORDER — SPIRONOLACTONE 25 MG PO TABS: 12.5000 mg | ORAL_TABLET | Freq: Every day | ORAL | Status: DC

## 2014-07-25 NOTE — Telephone Encounter (Signed)
LM TO CALL BACK  NEEDS F/U APPT  NEXT  AVAILABLE WITH DR Eden Emms   AND  BMET   AS  WELL ./CY

## 2014-07-25 NOTE — Addendum Note (Signed)
Addended by: Scherrie Bateman E on: 07/25/2014 12:40 PM   Modules accepted: Orders

## 2014-07-25 NOTE — Telephone Encounter (Signed)
Ok to refill needs BMET and see me next available

## 2014-08-01 ENCOUNTER — Ambulatory Visit (INDEPENDENT_AMBULATORY_CARE_PROVIDER_SITE_OTHER): Payer: 59 | Admitting: Family Medicine

## 2014-08-01 ENCOUNTER — Encounter: Payer: Self-pay | Admitting: Family Medicine

## 2014-08-01 ENCOUNTER — Other Ambulatory Visit (INDEPENDENT_AMBULATORY_CARE_PROVIDER_SITE_OTHER): Payer: 59

## 2014-08-01 VITALS — BP 122/82 | HR 84 | Ht 73.0 in | Wt 245.0 lb

## 2014-08-01 DIAGNOSIS — M7551 Bursitis of right shoulder: Secondary | ICD-10-CM

## 2014-08-01 DIAGNOSIS — M25511 Pain in right shoulder: Secondary | ICD-10-CM

## 2014-08-01 DIAGNOSIS — M7550 Bursitis of unspecified shoulder: Secondary | ICD-10-CM | POA: Insufficient documentation

## 2014-08-01 NOTE — Assessment & Plan Note (Signed)
Patient was given an injection today with near complete resolution of pain. We discussed icing regimen and home exercises. We discussed what activities to potentially avoid. Patient work with Event organiser today to learn exercises on a more regular basis. Patient then will come back and see me again in 3 weeks.

## 2014-08-01 NOTE — Patient Instructions (Addendum)
Good to see you.  Ice 20 minutes 2 times daily. Usually after activity and before bed. Exercises 3 times a week.  pennsaid pinkie amount topically 2 times daily as needed.  Avoid any activity with hands outside peripheral vision.  See me again in 3-4 weeks.

## 2014-08-01 NOTE — Progress Notes (Signed)
[name] [address] [place], [place] [address] Phone: [phone] Subjective:     CC: Right shoulder pain  [id number] Jason Hogan is a 39 y.o. male coming in with complaint of right shoulder pain. Patient states he has had this pain for quite some time. Patient feels that there is potentially a pinched nerve in his shoulder. Seems to be getting worse over the course of multiple weeks. Patient does not remember any true specific injury. Discuss it as a dull aching pain that seems to be worse when he reaches behind his back or overhead. Patient rates the severity of 4 out of 10. Can wake him up at night.       Past medical history, social, surgical and family history all reviewed in electronic medical record.   Review of Systems: No headache, visual changes, nausea, vomiting, diarrhea, constipation, dizziness, abdominal pain, skin rash, fevers, chills, night sweats, weight loss, swollen lymph nodes, body aches, joint swelling, muscle aches, chest pain, shortness of breath, mood changes.   Objective Blood pressure 122/82, pulse 84, height  (1.854 m), weight 245 lb (111.131 kg), SpO2 95 %.  General: No apparent distress alert and oriented x3 mood and affect normal, dressed appropriately.  HEENT: Pupils equal, extraocular movements intact  Respiratory: Patient's speak in full sentences and does not appear short of breath  Cardiovascular: No lower extremity edema, non tender, no erythema  Skin: Warm dry intact with no signs of infection or rash on extremities or on axial skeleton.  Abdomen: Soft nontender  Neuro: Cranial nerves II through XII are intact, neurovascularly intact in all extremities with 2+ DTRs and 2+ pulses.  Lymph: No lymphadenopathy of posterior or anterior cervical chain or axillae bilaterally.  Gait normal with good balance and coordination.  MSK:  Non tender with full range of motion and good stability and symmetric strength  and tone of  elbows, wrist, hip, knee and ankles bilaterally.  Shoulder: Right Inspection reveals no abnormalities, atrophy or asymmetry. Palpation is normal with no tenderness over AC joint or bicipital groove. ROM is full in all planes passively. Rotator cuff strength normal throughout. signs of impingement with positive Neer and Hawkin's tests, but negative empty can sign. Speeds and Yergason's tests normal. No labral pathology noted with negative Obrien's, negative clunk and good stability. Normal scapular function observed. No painful arc and no drop arm sign. No apprehension sign  MSK US performed of: Right This study was ordered, performed, and interpreted by Terrilee Files D.O.  Shoulder:   Supraspinatus:  Appears normal on long and transverse views, Bursal bulge seen with shoulder abduction on impingement view. Infraspinatus:  Appears normal on long and transverse views. Significant increase in Doppler flow Subscapularis:  Appears normal on long and transverse views. Positive bursa Teres Minor:  Appears normal on long and transverse views. AC joint:  Capsule undistended, no geyser sign. Glenohumeral Joint:  Appears normal without effusion. Glenoid Labrum:  Intact without visualized tears. Biceps Tendon:  Appears normal on long and transverse views, no fraying of tendon, tendon located in intertubercular groove, no subluxation with shoulder internal or external rotation.  Impression: Subacromial bursitis  Procedure: Real-time Ultrasound Guided Injection of right glenohumeral joint Device: GE Logiq E  Ultrasound guided injection is preferred based studies that show increased duration, increased effect, greater accuracy, decreased procedural pain, increased response rate with ultrasound guided versus blind injection.  Verbal informed consent obtained.  Time-out conducted.  Noted no overlying erythema,  induration, or other signs of local infection.  Skin prepped in a sterile  fashion.  Local anesthesia: Topical Ethyl chloride.  With sterile technique and under real time ultrasound guidance:  Joint visualized.  23g 1  inch needle inserted posterior approach. Pictures taken for needle placement. Patient did have injection of 2 cc of 1% lidocaine, 2 cc of 0.5% Marcaine, and 1.0 cc of Kenalog 40 mg/dL. Completed without difficulty  Pain immediately resolved suggesting accurate placement of the medication.  Advised to call if fevers/chills, erythema, induration, drainage, or persistent bleeding.  Images permanently stored and available for review in the ultrasound unit.  Impression: Technically successful ultrasound guided injection.   Procedure note 97110; 15 minutes spent for Therapeutic exercises as stated in above notes.  This included exercises focusing on stretching, strengthening, with significant focus on eccentric aspects.  Shoulder Exercises that included:  Basic scapular stabilization to include adduction and depression of scapula Scaption, focusing on proper movement and good control Internal and External rotation utilizing a theraband, with elbow tucked at side entire time Rows with theraband Proper technique shown and discussed handout in great detail with ATC.  All questions were discussed and answered.      Impression and Recommendations:     This case required medical decision making of moderate complexity.

## 2014-08-03 ENCOUNTER — Telehealth: Payer: Self-pay | Admitting: *Deleted

## 2014-08-03 NOTE — Telephone Encounter (Signed)
Rica Mote S >','<< Less Detail',event)" href="javascript:;">More Detail >>   Alois Cliche, LPN   Sent: Wed July 27, 2014 4:57 PM    To: Alois Cliche, LPN           JAHSIR KOBASHIGAWA  07/25/2014  Telephone  MRN:  [id number]   Description: 39 year old male  Provider: Alois Cliche, LPN  Department: [facility]         Call Documentation      Alois Cliche, LPN at 02/25/5629 12:37 PM     Status: Signed       Expand All Collapse All   LM TO CALL BACK NEEDS F/U APPT NEXT AVAILABLE WITH DR Eden Emms AND BMET AS WELL ./CY               Visit Pharmacy     [id number] - [address], [place] - [address] AT [facility] [address]       Contacts       Type Contact    07/25/2014 12:36 PM Phone (Outgoing) Rica Mote S (Self)    Phone: [phone] (H)    Left Message         Created by     Alois Cliche, LPN on 77/41/2878 12:36 PM     PER PT IS  BEING FOLLOWED  BY  [facility]  AT  THIS TIME./CY

## 2014-08-18 ENCOUNTER — Encounter: Payer: Self-pay | Admitting: Internal Medicine

## 2014-08-18 ENCOUNTER — Ambulatory Visit (INDEPENDENT_AMBULATORY_CARE_PROVIDER_SITE_OTHER): Payer: 59 | Admitting: Internal Medicine

## 2014-08-18 VITALS — BP 118/74 | HR 95 | Temp 98.4°F | Ht 74.0 in | Wt 240.0 lb

## 2014-08-18 DIAGNOSIS — J309 Allergic rhinitis, unspecified: Secondary | ICD-10-CM | POA: Insufficient documentation

## 2014-08-18 DIAGNOSIS — R1031 Right lower quadrant pain: Secondary | ICD-10-CM | POA: Diagnosis not present

## 2014-08-18 DIAGNOSIS — R040 Epistaxis: Secondary | ICD-10-CM | POA: Diagnosis not present

## 2014-08-18 NOTE — Assessment & Plan Note (Signed)
Mild to mod, no sign of infection or worsening allergies, on asa and effient - to cont this, but for ENT referral , may need cautery

## 2014-08-18 NOTE — Assessment & Plan Note (Signed)
Mild to mod, to change flonase to nasacort otc to minimize risk of further nosebleed.,  to f/u any worsening symptoms or concerns

## 2014-08-18 NOTE — Progress Notes (Signed)
Pre visit review using our clinic review tool, if applicable. No additional management support is needed unless otherwise documented below in the visit note. 

## 2014-08-18 NOTE — Patient Instructions (Addendum)
You will be contacted regarding the referral for: ENT asap  OK to stop the flonase, and change to nasacort OTC  Please continue all other medications as before, and refills have been done if requested.  Please have the pharmacy call with any other refills you may need.  Please keep your appointments with your specialists as you may have planned

## 2014-08-18 NOTE — Assessment & Plan Note (Signed)
C/w msk strain, I cannot apprec hernia, ok to monitor

## 2014-08-18 NOTE — Progress Notes (Signed)
Subjective:    Patient ID: Jason Hogan, male    DOB: August 07, 1975, 39 y.o.   MRN: [id number]  HPI  Here with recurrent left sided nosebleed x 2 days, worse to bending over at waist or blowing the nose, no sinus symptoms, HA, ST, fever, cough, Pt denies chest pain, increased sob or doe, wheezing, orthopnea, PND, increased LE swelling, palpitations, dizziness or syncope.  No other overt bleeding or bruising. On ASA and effient,  Each nosebleed hard to control.  No trauma.   Also sneezed recently after doing some lifting with moving furniture, with spasm pain to left costal margin, worse now to twist or bend, no hernia like swelling Past Medical History  Diagnosis Date  . Myocardial infarction   . CAD (coronary artery disease)   . Hyperlipidemia   . Renal insufficiency   . Respiratory failure   . Rhabdomyolysis   . Hypercholesteremia   . Migraine headache   . Bacterial pneumonia   . Heart failure   . Depression   . Insomnia   . Elevated blood pressure   . Cholelithiasis   . Asthma   . Renal calculus   . Respiratory failure   . Neck pain     right  . URI (upper respiratory infection)   . Anxiety 10/03/2011  . Allergic rhinitis, cause unspecified 10/03/2011  . MYOCARDIAL INFARCTION 09/18/2009    Qualifier: Diagnosis of  By: Kem Parkinson    . Chronic systolic heart failure 09/18/2009    Qualifier: Diagnosis of  By: Kem Parkinson    . CARDIOMYOPATHY, ISCHEMIC 10/20/2009    Qualifier: Diagnosis of  By: Graciela Husbands, MD, Ruthann Cancer Ty Hilts   . 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 ASTHMA 09/20/2006    Qualifier: Diagnosis of  By: Jonny Ruiz MD, Len Blalock   . RENAL INSUFFICIENCY 09/18/2009    Qualifier: Diagnosis of  By: Kem Parkinson    . INSOMNIA-SLEEP DISORDER-UNSPEC 06/22/2007    Qualifier: Diagnosis of  By: Jonny Ruiz MD, Len Blalock   . HYPERCHOLESTEROLEMIA 09/18/2009    Qualifier: Diagnosis of  By: Kem Parkinson    . DEPRESSION 06/22/2007    Qualifier: Diagnosis of  By: Maris Berger   . Rhabdomyolysis 09/18/2009    Qualifier: Diagnosis of  By: Kem Parkinson    . RENAL CALCULUS, HX OF 09/20/2006    Qualifier: Diagnosis of  By: Jonny Ruiz MD, Len Blalock   . MIGRAINE HEADACHE 09/18/2009    Qualifier: Diagnosis of  By: Kem Parkinson    . CHOLELITHIASIS 06/22/2007    Qualifier: Diagnosis of  By: Maris Berger   . Impaired glucose tolerance 10/05/2012  . Low testosterone 10/14/2013   No past surgical history on file.  reports that he quit smoking about 20 years ago. He has never used smokeless tobacco. He reports that he drinks alcohol. He reports that he does not use illicit drugs. family history includes Diabetes in an other family member; Heart disease in an other family member; Hypothyroidism in an other family member. Allergies  Allergen Reactions  . Ace Inhibitors Cough  . Crestor [Rosuvastatin]     Sweet craving  . Lunesta [Eszopiclone]     Bitter taste  . Zocor [Simvastatin]     Memory problem    Review of Systems  Constitutional: Negative for unusual diaphoresis or night sweats HENT: Negative for ringing in ear or discharge Eyes: Negative for  double vision or worsening visual disturbance.  Respiratory: Negative for choking and stridor.   Gastrointestinal: Negative for vomiting or other signifcant bowel change Genitourinary: Negative for hematuria or change in urine volume.  Musculoskeletal: Negative for other MSK pain or swelling Skin: Negative for color change and worsening wound.  Neurological: Negative for tremors and numbness other than noted  Psychiatric/Behavioral: Negative for decreased concentration or agitation other than above       Objective:   Physical Exam BP 118/74 mmHg  Pulse 95  Temp(Src) 98.4 F (36.9 C) (Oral)  Ht  (1.88 m)  Wt 240 lb (108.863 kg)  BMI 30.80 kg/m2  SpO2 96% VS noted,  Constitutional: Pt appears in no significant  distress HENT: Head: NCAT.  Right Ear: External ear normal.  Left Ear: External ear normal.  Bilat tm's with no erythema.  Max sinus areas non tender.  Pharynx with minor erythema, no exudate, no nasal tender or overt current bleeding Eyes: . Pupils are equal, round, and reactive to light. Conjunctivae and EOM are normal Neck: Normal range of motion. Neck supple.  Cardiovascular: Normal rate and regular rhythm.   Pulmonary/Chest: Effort normal and breath sounds without rales or wheezing.  Abd:  Soft, NT, ND, + BS except for mild tender left costal margin Neurological: Pt is alert. Not confused , motor grossly intact Skin: Skin is warm. No rash, no LE edema Psychiatric: Pt behavior is normal. No agitation.     Assessment & Plan:

## 2014-08-25 ENCOUNTER — Ambulatory Visit: Payer: 59 | Admitting: Family Medicine

## 2014-08-25 DIAGNOSIS — Z0289 Encounter for other administrative examinations: Secondary | ICD-10-CM

## 2014-10-07 ENCOUNTER — Other Ambulatory Visit: Payer: Self-pay | Admitting: Family Medicine

## 2014-10-07 NOTE — Telephone Encounter (Signed)
Refill done.  

## 2014-10-21 ENCOUNTER — Ambulatory Visit: Payer: 59 | Admitting: Internal Medicine

## 2014-10-26 ENCOUNTER — Other Ambulatory Visit: Payer: Self-pay

## 2014-10-26 MED ORDER — ATORVASTATIN CALCIUM 10 MG PO TABS: 10.0000 mg | ORAL_TABLET | Freq: Every day | ORAL | Status: DC

## 2014-10-28 ENCOUNTER — Ambulatory Visit: Payer: 59 | Admitting: Internal Medicine

## 2014-11-17 ENCOUNTER — Other Ambulatory Visit: Payer: Self-pay | Admitting: Internal Medicine

## 2014-11-17 ENCOUNTER — Ambulatory Visit (INDEPENDENT_AMBULATORY_CARE_PROVIDER_SITE_OTHER): Payer: 59 | Admitting: Family Medicine

## 2014-11-17 ENCOUNTER — Encounter: Payer: Self-pay | Admitting: Internal Medicine

## 2014-11-17 ENCOUNTER — Ambulatory Visit (INDEPENDENT_AMBULATORY_CARE_PROVIDER_SITE_OTHER): Payer: 59 | Admitting: Internal Medicine

## 2014-11-17 ENCOUNTER — Encounter: Payer: Self-pay | Admitting: Family Medicine

## 2014-11-17 ENCOUNTER — Other Ambulatory Visit (INDEPENDENT_AMBULATORY_CARE_PROVIDER_SITE_OTHER): Payer: 59

## 2014-11-17 VITALS — BP 120/82 | HR 93 | Temp 98.4°F | Ht 74.0 in | Wt 238.0 lb

## 2014-11-17 VITALS — BP 126/80 | HR 96 | Ht 74.0 in | Wt 240.0 lb

## 2014-11-17 DIAGNOSIS — M7150 Other bursitis, not elsewhere classified, unspecified site: Secondary | ICD-10-CM

## 2014-11-17 DIAGNOSIS — M7551 Bursitis of right shoulder: Secondary | ICD-10-CM

## 2014-11-17 DIAGNOSIS — M7050 Other bursitis of knee, unspecified knee: Secondary | ICD-10-CM

## 2014-11-17 DIAGNOSIS — Z0000 Encounter for general adult medical examination without abnormal findings: Secondary | ICD-10-CM

## 2014-11-17 DIAGNOSIS — M2550 Pain in unspecified joint: Secondary | ICD-10-CM

## 2014-11-17 DIAGNOSIS — Z23 Encounter for immunization: Secondary | ICD-10-CM | POA: Diagnosis not present

## 2014-11-17 LAB — HEPATIC FUNCTION PANEL
ALBUMIN: 4.8 g/dL (ref 3.5–5.2)
ALK PHOS: 65 U/L (ref 39–117)
ALT: 28 U/L (ref 0–53)
AST: 27 U/L (ref 0–37)
Bilirubin, Direct: 0.1 mg/dL (ref 0.0–0.3)
TOTAL PROTEIN: 8.6 g/dL — AB (ref 6.0–8.3)
Total Bilirubin: 0.6 mg/dL (ref 0.2–1.2)

## 2014-11-17 LAB — CBC WITH DIFFERENTIAL/PLATELET
BASOS ABS: 0 10*3/uL (ref 0.0–0.1)
Basophils Relative: 0.4 % (ref 0.0–3.0)
Eosinophils Absolute: 0.1 10*3/uL (ref 0.0–0.7)
Eosinophils Relative: 1.5 % (ref 0.0–5.0)
HCT: 45.5 % (ref 39.0–52.0)
HEMOGLOBIN: 15.1 g/dL (ref 13.0–17.0)
LYMPHS ABS: 2.3 10*3/uL (ref 0.7–4.0)
Lymphocytes Relative: 31 % (ref 12.0–46.0)
MCHC: 33.3 g/dL (ref 30.0–36.0)
MCV: 81.8 fl (ref 78.0–100.0)
MONO ABS: 0.9 10*3/uL (ref 0.1–1.0)
MONOS PCT: 12.8 % — AB (ref 3.0–12.0)
NEUTROS PCT: 54.3 % (ref 43.0–77.0)
Neutro Abs: 4 10*3/uL (ref 1.4–7.7)
Platelets: 264 10*3/uL (ref 150.0–400.0)
RBC: 5.57 Mil/uL (ref 4.22–5.81)
RDW: 14.2 % (ref 11.5–15.5)
WBC: 7.3 10*3/uL (ref 4.0–10.5)

## 2014-11-17 LAB — URINALYSIS, ROUTINE W REFLEX MICROSCOPIC
BILIRUBIN URINE: NEGATIVE
Hgb urine dipstick: NEGATIVE
KETONES UR: NEGATIVE
LEUKOCYTES UA: NEGATIVE
Nitrite: NEGATIVE
RBC / HPF: NONE SEEN (ref 0–?)
Specific Gravity, Urine: 1.01 (ref 1.000–1.030)
TOTAL PROTEIN, URINE-UPE24: NEGATIVE
URINE GLUCOSE: NEGATIVE
UROBILINOGEN UA: 0.2 (ref 0.0–1.0)
WBC, UA: NONE SEEN (ref 0–?)
pH: 7 (ref 5.0–8.0)

## 2014-11-17 LAB — LIPID PANEL
CHOL/HDL RATIO: 7
Cholesterol: 222 mg/dL — ABNORMAL HIGH (ref 0–200)
HDL: 33.8 mg/dL — ABNORMAL LOW (ref >39.00)
LDL Cholesterol: 163 mg/dL — ABNORMAL HIGH (ref 0–99)
NONHDL: 188.38
Triglycerides: 125 mg/dL (ref 0.0–149.0)
VLDL: 25 mg/dL (ref 0.0–40.0)

## 2014-11-17 LAB — BASIC METABOLIC PANEL
BUN: 21 mg/dL (ref 6–23)
CALCIUM: 10.3 mg/dL (ref 8.4–10.5)
CO2: 32 meq/L (ref 19–32)
Chloride: 92 mEq/L — ABNORMAL LOW (ref 96–112)
Creatinine, Ser: 1 mg/dL (ref 0.40–1.50)
GFR: 88.53 mL/min (ref >60.00)
GLUCOSE: 92 mg/dL (ref 70–99)
Potassium: 3.2 mEq/L — ABNORMAL LOW (ref 3.5–5.1)
SODIUM: 135 meq/L (ref 135–145)

## 2014-11-17 LAB — TSH: TSH: 1.44 u[IU]/mL (ref 0.35–4.50)

## 2014-11-17 MED ORDER — ATORVASTATIN CALCIUM 20 MG PO TABS: 20.0000 mg | ORAL_TABLET | Freq: Every day | ORAL | Status: DC

## 2014-11-17 NOTE — Assessment & Plan Note (Signed)
Patient given home exercises, discussed compression sees of the thigh that can help with the pulling from the hamstrings. Patient work with Event organiser today. Patient will try topical anti-inflammatory's. Patient come back and see me again in 3-4 weeks if not better we'll consider injections.

## 2014-11-17 NOTE — Assessment & Plan Note (Signed)

## 2014-11-17 NOTE — Progress Notes (Signed)
[name] [address] [place], [place] [address] Phone: [phone] Subjective:     CC: Right shoulder pain  [id number] Jason Hogan is a 39 y.o. male coming in with complaint of right shoulder pain. Patient was found to have more of a subacromial bursitis and was given an injection. Patient states unfortunately the pain is still there. States that with certain movements it seems to be more difficult. Patient states that it seems to fatigue with any type activity. Patient states regular little activity seems to be okay but if he doesn't repetitively that's when it seems to be more soreness. Sometimes has some radiation down the arm. Denies any nighttime awakening.  Patient is also complaining of bilateral knee pain. States that it is more diffuse. Patient states that the pain seemed to happen after being on his knees for a longer amount of time. Patient states since then has been significant a sore. Has not notice any significant swelling but states that any type of movement can be very painful. Up and down stairs seem to be worse. Squatting down the plate with his kids can also be bad. No true injury noted. Rates the severity of pain a 6 out of 10.     Past medical history, social, surgical and family history all reviewed in electronic medical record.   Review of Systems: No headache, visual changes, nausea, vomiting, diarrhea, constipation, dizziness, abdominal pain, skin rash, fevers, chills, night sweats, weight loss, swollen lymph nodes, body aches, joint swelling, muscle aches, chest pain, shortness of breath, mood changes.   Objective Blood pressure 126/80, pulse 96, height 6\' 2"  (1.88 m), weight 240 lb (108.863 kg), SpO2 98 %.  General: No apparent distress alert and oriented x3 mood and affect normal, dressed appropriately.  HEENT: Pupils equal, extraocular movements intact  Respiratory: Patient's speak in full sentences and does not appear short  of breath  Cardiovascular: No lower extremity edema, non tender, no erythema  Skin: Warm dry intact with no signs of infection or rash on extremities or on axial skeleton.  Abdomen: Soft nontender  Neuro: Cranial nerves II through XII are intact, neurovascularly intact in all extremities with 2+ DTRs and 2+ pulses.  Lymph: No lymphadenopathy of posterior or anterior cervical chain or axillae bilaterally.  Gait normal with good balance and coordination.  MSK:  Non tender with full range of motion and good stability and symmetric strength and tone of  elbows, wrist, hip, knee and ankles bilaterally.  Shoulder: Right Inspection reveals no abnormalities, atrophy or asymmetry. Generalized diffuse tenderness of both shoulders and knees bilaterally. ROM is full in all planes passively. Rotator cuff strength normal throughout. signs of impingement with positive Neer and Hawkin's tests, but negative empty can sign. Pain on the anterior aspect of the deltoid as well. Speeds and Yergason's tests normal. No labral pathology noted with negative Obrien's, negative clunk and good stability. Normal scapular function observed. No painful arc and no drop arm sign. No apprehension sign Knee: Bilateral Normal to inspection with no erythema or effusion or obvious bony abnormalities. Diffuse tenderness to the knees bilaterally but no specific findings ROM full in flexion and extension and lower leg rotation. Ligaments with solid consistent endpoints including ACL, PCL, LCL, MCL. Negative Mcmurray's, Apley's, and Thessalonian tests. Non painful patellar compression. Patellar glide without crepitus. Patellar and quadriceps tendons unremarkable. Hamstring and quadriceps strength is normal.         Impression and Recommendations:  This case required medical decision making of moderate complexity.

## 2014-11-17 NOTE — Patient Instructions (Signed)
Good to see you Ice is your friend Lets wait on the injection at this time Consider EMG I will ask We will get labs and I will send message.  New exercises for the knees Put compression sleeve on thigh to help Pennsaid can help the knees 2 times a day Make an appointment again in 4 weeks.

## 2014-11-17 NOTE — Progress Notes (Signed)
Subjective:    Patient ID: Jason Hogan, male    DOB: 12-05-1975, 39 y.o.   MRN: [id number]  HPI  Here for wellness and f/u;  Overall doing ok;  Pt denies Chest pain, worsening SOB, DOE, wheezing, orthopnea, PND, worsening LE edema, palpitations, dizziness or syncope.  Pt denies neurological change such as new headache, facial or extremity weakness.  Pt denies polydipsia, polyuria, or low sugar symptoms. Pt states overall good compliance with treatment and medications, good tolerability, and has been trying to follow appropriate diet.  Pt denies worsening depressive symptoms, suicidal ideation or panic. No fever, night sweats, wt loss, loss of appetite, or other constitutional symptoms.  Pt states good ability with ADL's, has low fall risk, home safety reviewed and adequate, no other significant changes in hearing or vision, and only occasionally active with exercise. Has seen Dr Katrinka Blazing in this office who is concerned about polyarthralgias. Past Medical History  Diagnosis Date  . Myocardial infarction   . CAD (coronary artery disease)   . Hyperlipidemia   . Renal insufficiency   . Respiratory failure   . Rhabdomyolysis   . Hypercholesteremia   . Migraine headache   . Bacterial pneumonia   . Heart failure   . Depression   . Insomnia   . Elevated blood pressure   . Cholelithiasis   . Asthma   . Renal calculus   . Respiratory failure   . Neck pain     right  . URI (upper respiratory infection)   . Anxiety 10/03/2011  . Allergic rhinitis, cause unspecified 10/03/2011  . MYOCARDIAL INFARCTION 09/18/2009    Qualifier: Diagnosis of  By: Kem Parkinson    . Chronic systolic heart failure 09/18/2009    Qualifier: Diagnosis of  By: Kem Parkinson    . CARDIOMYOPATHY, ISCHEMIC 10/20/2009    Qualifier: Diagnosis of  By: Graciela Husbands, MD, Ruthann Cancer Ty Hilts   . 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 ASTHMA  09/20/2006    Qualifier: Diagnosis of  By: Jonny Ruiz MD, Len Blalock   . RENAL INSUFFICIENCY 09/18/2009    Qualifier: Diagnosis of  By: Kem Parkinson    . INSOMNIA-SLEEP DISORDER-UNSPEC 06/22/2007    Qualifier: Diagnosis of  By: Jonny Ruiz MD, Len Blalock   . HYPERCHOLESTEROLEMIA 09/18/2009    Qualifier: Diagnosis of  By: Kem Parkinson    . DEPRESSION 06/22/2007    Qualifier: Diagnosis of  By: Maris Berger   . Rhabdomyolysis 09/18/2009    Qualifier: Diagnosis of  By: Kem Parkinson    . RENAL CALCULUS, HX OF 09/20/2006    Qualifier: Diagnosis of  By: Jonny Ruiz MD, Len Blalock   . MIGRAINE HEADACHE 09/18/2009    Qualifier: Diagnosis of  By: Kem Parkinson    . CHOLELITHIASIS 06/22/2007    Qualifier: Diagnosis of  By: Maris Berger   . Impaired glucose tolerance 10/05/2012  . Low testosterone 10/14/2013   No past surgical history on file.  reports that he quit smoking about 20 years ago. He has never used smokeless tobacco. He reports that he drinks alcohol. He reports that he does not use illicit drugs. family history includes Diabetes in an other family member; Heart disease in an other family member; Hypothyroidism in an other family member. Allergies  Allergen Reactions  . Ace Inhibitors Cough  . Crestor [Rosuvastatin]     Sweet craving  . Lunesta [Eszopiclone]  Bitter taste  . Zocor [Simvastatin]     Memory problem   Current Outpatient Prescriptions on File Prior to Visit  Medication Sig Dispense Refill  . albuterol (PROAIR HFA) 108 (90 BASE) MCG/ACT inhaler 2 puffs every 4 hours as needed only  if your can't catch your breath    . allopurinol (ZYLOPRIM) 300 MG tablet TAKE 1 TABLET BY MOUTH EVERY DAY 90 tablet 1  . aspirin 81 MG tablet Take 81 mg by mouth daily.     [name] atorvastatin (LIPITOR) 10 MG tablet Take 1 tablet (10 mg total) by mouth daily. 90 tablet 3  . BENICAR 20 MG tablet TAKE 1 TABLET BY MOUTH DAILY 30 tablet 6  . cetirizine (ZYRTEC) 10 MG tablet Take 10 mg by mouth  daily.    . Coenzyme Q10 (CO Q 10) 100 MG CAPS Take 1 capsule by mouth daily.    . colchicine (COLCRYS) 0.6 MG tablet Take 1 tablet (0.6 mg total) by mouth 2 (two) times daily. 60 tablet 1  . COREG CR 40 MG 24 hr capsule TAKE ONE CAPSULE BY MOUTH DAILY 30 capsule 0  . Diphenhyd-Hydrocort-Nystatin (FIRST-DUKES MOUTHWASH) SUSP Use as directed 5 mLs in the mouth or throat every 2 (two) hours as needed (sore throat). 237 mL 0  . EFFIENT 10 MG TABS tablet TAKE 1 TABLET BY MOUTH EVERY DAY 30 tablet 0  . furosemide (LASIX) 40 MG tablet TAKE 1 TABLET BY MOUTH DAILY 30 tablet 0  . metolazone (ZAROXOLYN) 5 MG tablet TAKE AS NEEDED. NEED APPOINTMENT. 30 tablet 0  . mometasone (NASONEX) 50 MCG/ACT nasal spray Place 2 sprays into the nose daily. As needed 17 g 5  . mometasone-formoterol (DULERA) 200-5 MCG/ACT AERO Inhale 2 puffs into the lungs 2 (two) times daily. 3 Inhaler 3  . Multiple Vitamins-Minerals (HAIR/SKIN/NAILS PO) Take 1 tablet by mouth daily.    . potassium chloride SA (K-DUR,KLOR-CON) 20 MEQ tablet TAKE 1 TABLET BY MOUTH TWICE DAILY 180 tablet 3  . spironolactone (ALDACTONE) 25 MG tablet Take 0.5 tablets (12.5 mg total) by mouth daily. 45 tablet 0  . tiZANidine (ZANAFLEX) 4 MG tablet Take 1 tablet (4 mg total) by mouth every 6 (six) hours as needed for muscle spasms. 60 tablet 5  . traZODone (DESYREL) 50 MG tablet 1-2 tabs by mouth at bedtime as needed 180 tablet 1  . azithromycin (ZITHROMAX Z-PAK) 250 MG tablet Use as directed (Patient not taking: Reported on 11/17/2014) 6 tablet 1  . HYDROcodone-acetaminophen (HYCET) 7.5-325 mg/15 ml solution Take 5-10 mLs by mouth every 4 (four) hours as needed for moderate pain. (Patient not taking: Reported on 11/17/2014) 140 mL 0  . HYDROcodone-acetaminophen (NORCO/VICODIN) 5-325 MG per tablet Take 1 tablet by mouth every 6 (six) hours as needed for moderate pain. (Patient not taking: Reported on 11/17/2014) 20 tablet 0   No current facility-administered  medications on file prior to visit.   Review of Systems Constitutional: Negative for increased diaphoresis, other activity, appetite or siginficant weight change other than noted HENT: Negative for worsening hearing loss, ear pain, facial swelling, mouth sores and neck stiffness.   Eyes: Negative for other worsening pain, redness or visual disturbance.  Respiratory: Negative for shortness of breath and wheezing  Cardiovascular: Negative for chest pain and palpitations.  Gastrointestinal: Negative for diarrhea, blood in stool, abdominal distention or other pain Genitourinary: Negative for hematuria, flank pain or change in urine volume.  Musculoskeletal: Negative for myalgias or other joint complaints.  Skin: Negative  for color change and wound or drainage.  Neurological: Negative for syncope and numbness. other than noted Hematological: Negative for adenopathy. or other swelling Psychiatric/Behavioral: Negative for hallucinations, SI, self-injury, decreased concentration or other worsening agitation.      Objective:   Physical Exam BP 120/82 mmHg  Pulse 93  Temp(Src) 98.4 F (36.9 C) (Oral)  Ht  (1.88 m)  Wt 238 lb (107.956 kg)  BMI 30.54 kg/m2  SpO2 97% VS noted, obese Constitutional: Pt is oriented to person, place, and time. Appears well-developed and well-nourished, in no significant distress Head: Normocephalic and atraumatic.  Right Ear: External ear normal.  Left Ear: External ear normal.  Nose: Nose normal.  Mouth/Throat: Oropharynx is clear and moist.  Eyes: Conjunctivae and EOM are normal. Pupils are equal, round, and reactive to light.  Neck: Normal range of motion. Neck supple. No JVD present. No tracheal deviation present or significant neck LA or mass Cardiovascular: Normal rate, regular rhythm, normal heart sounds and intact distal pulses.   Pulmonary/Chest: Effort normal and breath sounds without rales or wheezing  Abdominal: Soft. Bowel sounds are normal.  NT. No HSM  Musculoskeletal: Normal range of motion. Exhibits no edema.  Lymphadenopathy:  Has no cervical adenopathy.  Neurological: Pt is alert and oriented to person, place, and time. Pt has normal reflexes. No cranial nerve deficit. Motor grossly intact Skin: Skin is warm and dry. No rash noted.  Psychiatric:  Has normal mood and affect. Behavior is normal.     Assessment & Plan:

## 2014-11-17 NOTE — Patient Instructions (Signed)

## 2014-11-17 NOTE — Assessment & Plan Note (Signed)
Patient overall seems to be doing a little better but not all the way. We may need to consider repeat injection into the shoulder. Patient had that not long ago though. Patient does have what appears to be a cystic formation on the anterior aspect of the shoulder within possibly the deltoid seen on ultrasound previously. We could consider a injection aspiration and this. With the polyarthralgia before we get any aggressive affect is see with labs show. Otherwise advance imaging of the shoulder may be necessary as well to rule out such things as a labral tear. We'll discuss after labs.

## 2014-11-17 NOTE — Assessment & Plan Note (Signed)
Patient is having significant polyarthralgia as well as a history of gout. Patient has had significant other systemic problems including cardiovascular, renal insufficiency that is concerning for potential autoimmune disease. We will get labs today. Patient is to be taking colchicine fairly regularly which she has not. We'll check his uric as level as well to see if this is contributing to some of the discomfort and pain. Depending on these findings and we'll consider treatment more for the shoulder and knee indirectly. Patient does test positive for autoimmune referral to rheumatology could be considered. We'll discuss with patient when labs are obtained. Patient otherwise will follow-up in 3-4 weeks for his pes anserine bursitis.  Spent  25 minutes with patient face-to-face and had greater than 50% of counseling including as described above in assessment and plan.

## 2014-11-17 NOTE — Progress Notes (Signed)
Pre visit review using our clinic review tool, if applicable. No additional management support is needed unless otherwise documented below in the visit note. 

## 2014-11-18 ENCOUNTER — Encounter: Payer: Self-pay | Admitting: Family Medicine

## 2014-11-18 ENCOUNTER — Telehealth: Payer: Self-pay | Admitting: *Deleted

## 2014-11-18 ENCOUNTER — Telehealth: Payer: Self-pay

## 2014-11-18 ENCOUNTER — Other Ambulatory Visit (INDEPENDENT_AMBULATORY_CARE_PROVIDER_SITE_OTHER): Payer: 59

## 2014-11-18 DIAGNOSIS — M2550 Pain in unspecified joint: Secondary | ICD-10-CM | POA: Diagnosis not present

## 2014-11-18 LAB — CBC WITH DIFFERENTIAL/PLATELET
BASOS PCT: 0.5 % (ref 0.0–3.0)
Basophils Absolute: 0 10*3/uL (ref 0.0–0.1)
EOS ABS: 0.1 10*3/uL (ref 0.0–0.7)
Eosinophils Relative: 1.6 % (ref 0.0–5.0)
HEMATOCRIT: 43.6 % (ref 39.0–52.0)
Hemoglobin: 14.5 g/dL (ref 13.0–17.0)
LYMPHS PCT: 35.6 % (ref 12.0–46.0)
Lymphs Abs: 2.6 10*3/uL (ref 0.7–4.0)
MCHC: 33.3 g/dL (ref 30.0–36.0)
MCV: 80.7 fl (ref 78.0–100.0)
MONO ABS: 0.8 10*3/uL (ref 0.1–1.0)
Monocytes Relative: 11 % (ref 3.0–12.0)
NEUTROS ABS: 3.7 10*3/uL (ref 1.4–7.7)
Neutrophils Relative %: 51.3 % (ref 43.0–77.0)
PLATELETS: 255 10*3/uL (ref 150.0–400.0)
RBC: 5.4 Mil/uL (ref 4.22–5.81)
RDW: 14.1 % (ref 11.5–15.5)
WBC: 7.3 10*3/uL (ref 4.0–10.5)

## 2014-11-18 LAB — TSH: TSH: 1.53 u[IU]/mL (ref 0.35–4.50)

## 2014-11-18 LAB — URIC ACID: Uric Acid, Serum: 6.3 mg/dL (ref 4.0–7.8)

## 2014-11-18 LAB — SEDIMENTATION RATE: SED RATE: 20 mm/h (ref 0–22)

## 2014-11-18 LAB — C-REACTIVE PROTEIN: CRP: 0.4 mg/dL — ABNORMAL LOW (ref 0.5–20.0)

## 2014-11-18 NOTE — Telephone Encounter (Signed)
Spoke with patient regarding needing to come back to [facility] to have labs redrawn for Dr. Katrinka Blazing. He stated that he could do it today or Monday

## 2014-11-18 NOTE — Telephone Encounter (Signed)
Receive call pt states nurse call him this am concerningchis cholesterol. MD want to increase Lipitor to 20 mg. He stated that he hasn't taking Lipitor since 6/28, because he didn't have anymore refills. Should he take the 20 mg. Advised pt to go ahead and take the 20 mg. His labs that was done back ion March was still high with the 10 mg. He stated ok will pick up and start tonight...Raechel Chute

## 2014-11-21 LAB — ANTI-DNA ANTIBODY, DOUBLE-STRANDED: DS DNA AB: 1 [IU]/mL

## 2014-11-21 LAB — ANA: ANA: NEGATIVE

## 2014-11-22 LAB — CYCLIC CITRUL PEPTIDE ANTIBODY, IGG

## 2014-12-15 ENCOUNTER — Encounter: Payer: Self-pay | Admitting: Family Medicine

## 2014-12-15 ENCOUNTER — Ambulatory Visit (INDEPENDENT_AMBULATORY_CARE_PROVIDER_SITE_OTHER): Payer: 59 | Admitting: Family Medicine

## 2014-12-15 VITALS — BP 116/80 | HR 75 | Ht 74.0 in | Wt 224.0 lb

## 2014-12-15 DIAGNOSIS — M25511 Pain in right shoulder: Secondary | ICD-10-CM | POA: Insufficient documentation

## 2014-12-15 MED ORDER — TRAMADOL HCL 50 MG PO TABS: 50.0000 mg | ORAL_TABLET | Freq: Every evening | ORAL | Status: DC | PRN

## 2014-12-15 NOTE — Addendum Note (Signed)
Addended by: Edwena Felty T on: 12/15/2014 09:42 AM   Modules accepted: Orders

## 2014-12-15 NOTE — Progress Notes (Signed)
Pre visit review using our clinic review tool, if applicable. No additional management support is needed unless otherwise documented below in the visit note. 

## 2014-12-15 NOTE — Patient Instructions (Addendum)
Good to see you Tramadol 50mg  at night and take with a tylenol Ice is still your friend We will get CT scan of the right shoulder See me again 1-2 days after the CT scan and we will see what we find.

## 2014-12-15 NOTE — Assessment & Plan Note (Signed)
Patient continues to have right shoulder pain. We discussed icing regimen and home exercises. We discussed which activities to do an which was potentially avoid. We discussed with patient not making any significant improvement I do feel that advance imaging is warranted. Due to patient's defibrillator we will do a CT and arthrogram that I think will be beneficial. Patient will be ordered for this and come back 1-2 days afterwards and we'll discuss further treatment options.

## 2014-12-15 NOTE — Progress Notes (Signed)
  [name] [address] [place], [place] [address] Phone: [phone] Subjective:     CC: Right shoulder pain  [id number] Jason Hogan is a 39 y.o. male coming in with complaint of right shoulder pain. Patient was found to have more of a subacromial bursitis and was given an injection. Vision did not respond well to the injection previously. Patient did not respond. He has not responded to formal physical therapy. X-ray did not have any specific findings. Patient continues to have significant difficult he. Patient does have a defibrillator Zenda Alpers been unable to get an MRI. Patient states that the pain may be worsening. Just not using his right arm at all. Denies any weakness. Patient states that he continues wake him up at night and the pain medications are not helping.  Patient is also complaining of bilateral knee pain. Patient states much better since he has lost 20 pounds     Past medical history, social, surgical and family history all reviewed in electronic medical record.   Review of Systems: No headache, visual changes, nausea, vomiting, diarrhea, constipation, dizziness, abdominal pain, skin rash, fevers, chills, night sweats, weight loss, swollen lymph nodes, body aches, joint swelling, muscle aches, chest pain, shortness of breath, mood changes.   Objective Blood pressure 116/80, pulse 75, height 6\' 2"  (1.88 m), weight 224 lb (101.606 kg), SpO2 95 %.  General: No apparent distress alert and oriented x3 mood and affect normal, dressed appropriately.  HEENT: Pupils equal, extraocular movements intact  Respiratory: Patient's speak in full sentences and does not appear short of breath  Cardiovascular: No lower extremity edema, non tender, no erythema  Skin: Warm dry intact with no signs of infection or rash on extremities or on axial skeleton.  Abdomen: Soft nontender  Neuro: Cranial nerves II through XII are intact, neurovascularly intact in all  extremities with 2+ DTRs and 2+ pulses.  Lymph: No lymphadenopathy of posterior or anterior cervical chain or axillae bilaterally.  Gait normal with good balance and coordination.  MSK:  Non tender with full range of motion and good stability and symmetric strength and tone of  elbows, wrist, hip, knee and ankles bilaterally.  Shoulder: Right Inspection reveals no abnormalities, atrophy or asymmetry. Generalized diffuse tenderness of both shoulders and knees bilaterally. ROM is full in all planes passively. Rotator cuff strength normal throughout. signs of impingement with positive Neer and Hawkin's tests, but negative empty can sign. Pain on the anterior aspect of the deltoid as well. Speeds and Yergason's tests normal. Positive O'Brien's with positive clunk Normal scapular function observed. No painful arc and no drop arm sign. No apprehension sign Knee: Bilateral Normal to inspection with no erythema or effusion or obvious bony abnormalities. Nontender on exam ROM full in flexion and extension and lower leg rotation. Ligaments with solid consistent endpoints including ACL, PCL, LCL, MCL. Negative Mcmurray's, Apley's, and Thessalonian tests. Non painful patellar compression. Patellar glide without crepitus. Patellar and quadriceps tendons unremarkable. Hamstring and quadriceps strength is normal.      Impression and Recommendations:     This case required medical decision making of moderate complexity.

## 2014-12-26 ENCOUNTER — Encounter: Payer: Self-pay | Admitting: *Deleted

## 2015-01-09 ENCOUNTER — Other Ambulatory Visit: Payer: Self-pay | Admitting: Internal Medicine

## 2015-01-10 NOTE — Telephone Encounter (Signed)
xanaflex - done erx

## 2015-01-26 ENCOUNTER — Ambulatory Visit
Admission: RE | Admit: 2015-01-26 | Discharge: 2015-01-26 | Disposition: A | Discharge status: Home / Self Care | Payer: 59 | Source: Ambulatory Visit | Attending: Family Medicine | Admitting: Family Medicine

## 2015-01-26 DIAGNOSIS — M25511 Pain in right shoulder: Secondary | ICD-10-CM

## 2015-01-26 MED ORDER — IOHEXOL 180 MG/ML  SOLN
15.0000 mL | Freq: Once | INTRAMUSCULAR | Status: AC | PRN
  Administered 2015-01-26: 15 mL via INTRA_ARTICULAR

## 2015-01-30 ENCOUNTER — Encounter: Payer: Self-pay | Admitting: Family Medicine

## 2015-02-02 ENCOUNTER — Other Ambulatory Visit: Payer: Self-pay | Admitting: Internal Medicine

## 2015-02-02 NOTE — Telephone Encounter (Signed)
Please advise in PCP's absence, thanks! 

## 2015-03-08 ENCOUNTER — Ambulatory Visit (INDEPENDENT_AMBULATORY_CARE_PROVIDER_SITE_OTHER): Payer: BLUE CROSS/BLUE SHIELD | Admitting: Family Medicine

## 2015-03-08 ENCOUNTER — Encounter: Payer: Self-pay | Admitting: Family Medicine

## 2015-03-08 ENCOUNTER — Ambulatory Visit (INDEPENDENT_AMBULATORY_CARE_PROVIDER_SITE_OTHER): Payer: Self-pay

## 2015-03-08 ENCOUNTER — Ambulatory Visit (INDEPENDENT_AMBULATORY_CARE_PROVIDER_SITE_OTHER)
Admission: RE | Admit: 2015-03-08 | Discharge: 2015-03-08 | Disposition: A | Discharge status: Home / Self Care | Payer: Self-pay | Source: Ambulatory Visit | Attending: Family Medicine | Admitting: Family Medicine

## 2015-03-08 VITALS — BP 114/78 | HR 81 | Ht 74.0 in | Wt 212.0 lb

## 2015-03-08 DIAGNOSIS — M5412 Radiculopathy, cervical region: Secondary | ICD-10-CM | POA: Diagnosis not present

## 2015-03-08 DIAGNOSIS — M10271 Drug-induced gout, right ankle and foot: Secondary | ICD-10-CM | POA: Diagnosis not present

## 2015-03-08 DIAGNOSIS — M7751 Other enthesopathy of right foot: Secondary | ICD-10-CM

## 2015-03-08 DIAGNOSIS — M542 Cervicalgia: Secondary | ICD-10-CM | POA: Diagnosis not present

## 2015-03-08 MED ORDER — PREDNISONE 50 MG PO TABS: 50.0000 mg | ORAL_TABLET | Freq: Every day | ORAL | Status: DC

## 2015-03-08 MED ORDER — GABAPENTIN 100 MG PO CAPS: 100.0000 mg | ORAL_CAPSULE | Freq: Every day | ORAL | Status: DC

## 2015-03-08 NOTE — Progress Notes (Signed)
Pre visit review using our clinic review tool, if applicable. No additional management support is needed unless otherwise documented below in the visit note. 

## 2015-03-08 NOTE — Progress Notes (Signed)
[name] [address] [place], [place] [address] Phone: [phone] Subjective:      CC: right foot gout follow up   BJY:Jason Hogan Jason Hogan is a 40 y.o. male coming in with complaint of foot swelling.patient previously did have a capsulitis of the first toe. Did respond very well to an injection. Has been multiple months. Patient has been trying his colchicine with no significant improvement. Patient has started running. States that he did notice some mild discomfort. Patient states though that it has gotten worse over the course last 3 days. States that it is almost equal to walk regularly. Patient did have an increase in redness but patient has lost a lot of weight and has stopped drinking beer.    Patient is also complaining of bilateral shoulder pain. States that he is noticed significant weakness. Patient descries pain is more of a dull, throbbing aching sensation. Patient states that he notices he gets fatigued with even any overhead activity or even pushing somewhat. States that if he wakes up sometimes he has numbness in the ring and small fingers bilaterally. Seems to be worsening over time.patient also is having migraine secondary to the pain.  Past Medical History  Diagnosis Date  . Myocardial infarction (HCC)   . CAD (coronary artery disease)   . Hyperlipidemia   . Renal insufficiency   . Respiratory failure (HCC)   . Rhabdomyolysis   . Hypercholesteremia   . Migraine headache   . Bacterial pneumonia   . Heart failure   . Depression   . Insomnia   . Elevated blood pressure   . Cholelithiasis   . Asthma   . Renal calculus   . Respiratory failure (HCC)   . Neck pain     right  . URI (upper respiratory infection)   . Anxiety 10/03/2011  . Allergic rhinitis, cause unspecified 10/03/2011  . MYOCARDIAL INFARCTION 09/18/2009    Qualifier: Diagnosis of  By: Kem Parkinson    . Chronic systolic heart failure (HCC) 09/18/2009   Qualifier: Diagnosis of  By: Kem Parkinson    . CARDIOMYOPATHY, ISCHEMIC 10/20/2009    Qualifier: Diagnosis of  By: Graciela Husbands, MD, Ruthann Cancer Ty Hilts   . 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 ASTHMA 09/20/2006    Qualifier: Diagnosis of  By: Jonny Ruiz MD, Len Blalock   . RENAL INSUFFICIENCY 09/18/2009    Qualifier: Diagnosis of  By: Kem Parkinson    . INSOMNIA-SLEEP DISORDER-UNSPEC 06/22/2007    Qualifier: Diagnosis of  By: Jonny Ruiz MD, Len Blalock   . HYPERCHOLESTEROLEMIA 09/18/2009    Qualifier: Diagnosis of  By: Kem Parkinson    . DEPRESSION 06/22/2007    Qualifier: Diagnosis of  By: Maris Berger   . Rhabdomyolysis 09/18/2009    Qualifier: Diagnosis of  By: Kem Parkinson    . RENAL CALCULUS, HX OF 09/20/2006    Qualifier: Diagnosis of  By: Jonny Ruiz MD, Len Blalock   . MIGRAINE HEADACHE 09/18/2009    Qualifier: Diagnosis of  By: Kem Parkinson    . CHOLELITHIASIS 06/22/2007    Qualifier: Diagnosis of  By: Maris Berger   . Impaired glucose tolerance 10/05/2012  . Low testosterone 10/14/2013   No past surgical history on file. Social History  Substance Use Topics  . Smoking status: Former Smoker -- 0.10 packs/day for 2 years    Quit date: 02/18/1994  .  Smokeless tobacco: Never Used  . Alcohol Use: Yes   Allergies  Allergen Reactions  . Ace Inhibitors Cough  . Crestor [Rosuvastatin]     Sweet craving  . Lunesta [Eszopiclone]     Bitter taste  . Zocor [Simvastatin]     Memory problem   Family History  Problem Relation Age of Onset  . Heart disease    . Diabetes    . Hypothyroidism       Past medical history, social, surgical and family history all reviewed in electronic medical record.   Review of Systems: No headache, visual changes, nausea, vomiting, diarrhea, constipation, dizziness, abdominal pain, skin rash, fevers, chills, night sweats, weight loss, swollen lymph nodes, body  aches, joint swelling, muscle aches, chest pain, shortness of breath, mood changes.   Objective Blood pressure 114/78, pulse 81, height 6\' 2"  (1.88 m), weight 212 lb (96.163 kg), SpO2 98 %.  General: No apparent distress alert and oriented x3 mood and affect normal, dressed appropriately.  HEENT: Pupils equal, extraocular movements intact  Respiratory: Patient's speak in full sentences and does not appear short of breath  Cardiovascular: No lower extremity edema, non tender, no erythema  Skin: Warm dry intact with no signs of infection or rash on extremities or on axial skeleton.  Abdomen: Soft nontender  Neuro: Cranial nerves II through XII are intact, neurovascularly intact in all extremities with 2+ DTRs and 2+ pulses.  Lymph: No lymphadenopathy of posterior or anterior cervical chain or axillae bilaterally.  Gait normal with good balance and coordination.  MSK:  Non tender with full range of motion and good stability and symmetric strength and tone of shoulders, elbows, wrist, hip, knee and ankles bilaterally.   right foot exam swelling of the MTP Patient still have hallux rigidus compared to the contralateral side. Severely tender to palpation even to light palpation over the first metatarsal. Mild erythema on the skin.  Neck: Inspection unremarkable. No palpable stepoffs. Bilateral positive Spurling's Full neck range of motion Grip strength and sensation normal in bilateral hands Strength good C4 to T1 distribution No sensory change to C4 to T1 Negative Hoffman sign bilaterally Reflexes normal  Limited musculoskeletal ultrasound was performed and interpreted by Antoine Primas, M  Limited ultrasound of the first metatarsal shows patient has significant hypoechoic changes with a synovitis noted. Likely has gouty deposits noted. Larger than previous flare. Impression: Capsulitis of first metatarsal joint   After verbal consent patient was prepped with alcohol swabs and with a  25-gauge 1 inch needle was injected with 0.5 cc of 0.5% Marcaine and 1 cc of Kenalog 40 mg/dL into the MTP joint under ultrasound guidance. Patient tolerated the procedure well with near complete resolution of pain. Postinjection instructions given. Picture saved in internal hard drive Impression: Successful ultrasound guidance injection of the first MTP joint   Impression and Recommendations:     This case required medical decision making of moderate complexity.

## 2015-03-08 NOTE — Patient Instructions (Signed)
Good to see you  Gabapentin 100-200mg  at night Prednisone daily for next 5 days for neck Neck xray downstairs today  Send me a message in 5 days and if not a lot better we may need a CT scan of head and neck.

## 2015-03-08 NOTE — Assessment & Plan Note (Signed)
Patient is having severe amount of radicular symptoms. Discussed with patient about different treatment options. Patient does elect to try the prednisone. Warned of potential side effects. Gabapentin at a very low dose of 100-200 mg at night. We discussed watching posture. If no significant improvement further imaging may be needed. We will get x-rays today for further evaluation. Patient has any more weakness or numbness that seems to be more constant now is to seek medical attention immediately. Only problem will be patient is on a blood thinner chronically and if any invasive procedures are needed this could be complicated.

## 2015-03-08 NOTE — Assessment & Plan Note (Signed)
Patient given injection today and tolerated the procedure well. We discussed icing regimen. We discussed proper shoes with more rigid bottom. We discussed diet changes that I think will be beneficial. We discussed no improvement x-rays will be needed but I think this will be low likelihood. Patient will come back and see me again in 2 weeks if no improvement.

## 2015-03-14 ENCOUNTER — Other Ambulatory Visit: Payer: Self-pay | Admitting: Family Medicine

## 2015-03-14 NOTE — Telephone Encounter (Signed)
Refill done.  

## 2015-03-25 ENCOUNTER — Other Ambulatory Visit: Payer: Self-pay | Admitting: Family Medicine

## 2015-03-27 NOTE — Telephone Encounter (Signed)
Refill done.  

## 2015-04-03 ENCOUNTER — Other Ambulatory Visit: Payer: Self-pay | Admitting: Cardiovascular Disease

## 2015-04-07 NOTE — Addendum Note (Signed)
Addended by: Awilda Bill on: 04/07/2015 01:52 PM   Modules accepted: Orders

## 2015-05-08 ENCOUNTER — Telehealth: Payer: Self-pay | Admitting: Internal Medicine
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
## 2015-05-08 NOTE — Telephone Encounter (Signed)
°  1. Has your device fired? YES 3XS   2. Is you device beeping? NO 3. Are you experiencing draining or swelling at device site? NO  4. Are you calling to see if we received your device transmission? NO  5. Have you passed out? NO

## 2015-05-09 ENCOUNTER — Encounter: Payer: Self-pay | Admitting: Internal Medicine

## 2015-05-09 ENCOUNTER — Ambulatory Visit (INDEPENDENT_AMBULATORY_CARE_PROVIDER_SITE_OTHER): Payer: BLUE CROSS/BLUE SHIELD | Admitting: Internal Medicine

## 2015-05-09 VITALS — BP 120/79 | HR 84 | Ht 74.0 in | Wt 208.0 lb

## 2015-05-09 DIAGNOSIS — I255 Ischemic cardiomyopathy: Secondary | ICD-10-CM

## 2015-05-09 DIAGNOSIS — Z95810 Presence of automatic (implantable) cardiac defibrillator: Secondary | ICD-10-CM | POA: Diagnosis not present

## 2015-05-09 DIAGNOSIS — I5022 Chronic systolic (congestive) heart failure: Secondary | ICD-10-CM

## 2015-05-09 LAB — CUP PACEART INCLINIC DEVICE CHECK
Battery Remaining Longevity: 58.8
Brady Statistic RV Percent Paced: 0 %
Date Time Interrogation Session: 20170321190414
HighPow Impedance: 42.4134
Implantable Lead Location: 753860
Lead Channel Setting Sensing Sensitivity: 0.5 mV
MDC IDC LEAD IMPLANT DT: 20111010
MDC IDC LEAD MODEL: 7121
MDC IDC MSMT LEADCHNL RV IMPEDANCE VALUE: 375 Ohm
MDC IDC MSMT LEADCHNL RV PACING THRESHOLD AMPLITUDE: 0.75 V
MDC IDC MSMT LEADCHNL RV PACING THRESHOLD PULSEWIDTH: 0.5 ms
MDC IDC MSMT LEADCHNL RV SENSING INTR AMPL: 11.9 mV
MDC IDC PG SERIAL: 615318
MDC IDC SET LEADCHNL RV PACING AMPLITUDE: 2.5 V
MDC IDC SET LEADCHNL RV PACING PULSEWIDTH: 0.5 ms

## 2015-05-09 NOTE — Patient Instructions (Signed)
Medication Instructions: - Your physician recommends that you continue on your current medications as directed. Please refer to the Current Medication list given to you today.  Labwork: - Your physician recommends that you have lab work today: BMP/ Magnesium  Procedures/Testing: - none  Follow-Up: - Your physician recommends that you schedule a follow-up appointment in: 3 months with Dr. Graciela Husbands.  Any Additional Special Instructions Will Be Listed Below (If Applicable).     If you need a refill on your cardiac medications before your next appointment, please call your pharmacy.

## 2015-05-09 NOTE — Progress Notes (Signed)
Patient Care Team: Corwin Levins, MD as PCP - General Wendall Stade, MD (Cardiology)   HPI  Jason Hogan is a 40 y.o. male Seen as add on today following ICD discharges.  Initially seen at local ER with low K and Mag He got shocked again the subsequent day, but did not go back to the hospital for lab work  He has had little restrictions in activity  He ahs been on triple diuretic therapy for some time under the care of Dr Roxan Hockey at [facility]   He had primary prevention ICD implanted 2011 following large anterior wall MI  He has not seen cardiology since 2014  Records and Results Reviewed form outside office and device interogations  Past Medical History  Diagnosis Date  . Myocardial infarction (HCC)   . CAD (coronary artery disease)   . Hyperlipidemia   . Renal insufficiency   . Respiratory failure (HCC)   . Rhabdomyolysis   . Hypercholesteremia   . Migraine headache   . Bacterial pneumonia   . Heart failure   . Depression   . Insomnia   . Elevated blood pressure   . Cholelithiasis   . Asthma   . Renal calculus   . Respiratory failure (HCC)   . Neck pain     right  . URI (upper respiratory infection)   . Anxiety 10/03/2011  . Allergic rhinitis, cause unspecified 10/03/2011  . MYOCARDIAL INFARCTION 09/18/2009    Qualifier: Diagnosis of  By: Kem Parkinson    . Chronic systolic heart failure (HCC) 09/18/2009    Qualifier: Diagnosis of  By: Kem Parkinson    . CARDIOMYOPATHY, ISCHEMIC 10/20/2009    Qualifier: Diagnosis of  By: Graciela Husbands, MD, Ruthann Cancer Ty Hilts   . 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 ASTHMA 09/20/2006    Qualifier: Diagnosis of  By: Jonny Ruiz MD, Len Blalock   . RENAL INSUFFICIENCY 09/18/2009    Qualifier: Diagnosis of  By: Kem Parkinson    . INSOMNIA-SLEEP DISORDER-UNSPEC 06/22/2007    Qualifier: Diagnosis of  By: Jonny Ruiz MD, Len Blalock   . HYPERCHOLESTEROLEMIA  09/18/2009    Qualifier: Diagnosis of  By: Kem Parkinson    . DEPRESSION 06/22/2007    Qualifier: Diagnosis of  By: Maris Berger   . Rhabdomyolysis 09/18/2009    Qualifier: Diagnosis of  By: Kem Parkinson    . RENAL CALCULUS, HX OF 09/20/2006    Qualifier: Diagnosis of  By: Jonny Ruiz MD, Len Blalock   . MIGRAINE HEADACHE 09/18/2009    Qualifier: Diagnosis of  By: Kem Parkinson    . CHOLELITHIASIS 06/22/2007    Qualifier: Diagnosis of  By: Maris Berger   . Impaired glucose tolerance 10/05/2012  . Low testosterone 10/14/2013    No past surgical history on file.  Current Outpatient Prescriptions  Medication Sig Dispense Refill  . albuterol (PROAIR HFA) 108 (90 BASE) MCG/ACT inhaler 2 puffs every 4 hours as needed only  if your can't catch your breath    . allopurinol (ZYLOPRIM) 300 MG tablet TAKE 1 TABLET BY MOUTH EVERY DAY 90 tablet 1  . aspirin 81 MG tablet Take 81 mg by mouth daily.     [name] atorvastatin (LIPITOR) 20 MG tablet Take 1 tablet (20 mg total) by mouth daily. 90 tablet 3  . cetirizine (ZYRTEC) 10 MG tablet Take 10 mg by mouth daily.    [name]  Coenzyme Q10 (CO Q 10) 100 MG CAPS Take 1 capsule by mouth daily.    . colchicine 0.6 MG tablet TAKE 1 TABLET BY MOUTH TWICE DAILY 60 tablet 0  . EFFIENT 10 MG TABS tablet TAKE 1 TABLET BY MOUTH EVERY DAY 30 tablet 0  . furosemide (LASIX) 40 MG tablet TAKE 1 TABLET BY MOUTH DAILY 30 tablet 0  . gabapentin (NEURONTIN) 100 MG capsule Take 1 capsule (100 mg total) by mouth at bedtime. 30 capsule 3  . metolazone (ZAROXOLYN) 5 MG tablet TAKE AS NEEDED. NEED APPOINTMENT. 30 tablet 0  . mometasone (NASONEX) 50 MCG/ACT nasal spray Place 2 sprays into the nose daily. As needed 17 g 5  . mometasone-formoterol (DULERA) 200-5 MCG/ACT AERO Inhale 2 puffs into the lungs 2 (two) times daily. 3 Inhaler 3  . potassium chloride SA (K-DUR,KLOR-CON) 20 MEQ tablet TAKE 1 TABLET BY MOUTH TWICE DAILY 180 tablet 3  . spironolactone (ALDACTONE) 25 MG  tablet Take 0.5 tablets (12.5 mg total) by mouth daily. 45 tablet 0  . tiZANidine (ZANAFLEX) 4 MG tablet TAKE 1 TABLET(4 MG) BY MOUTH EVERY 6 HOURS AS NEEDED FOR MUSCLE SPASMS 60 tablet 1  . traMADol (ULTRAM) 50 MG tablet TAKE 1 TABLET BY MOUTH AT BEDTIME AS NEEDED 30 tablet 0  . traZODone (DESYREL) 50 MG tablet TAKE 1 TO 2 TABLET BY MOUTH EVERY NIGHT AT BEDTIME AS NEEDED 180 tablet 3   No current facility-administered medications for this visit.    Allergies  Allergen Reactions  . Ace Inhibitors Cough  . Crestor [Rosuvastatin]     Sweet craving  . Lunesta [Eszopiclone]     Bitter taste  . Zocor [Simvastatin]     Memory problem      Review of Systems negative except from HPI and PMH  Physical Exam BP 120/79 mmHg  Pulse 84  Ht  (1.88 m)  Wt 208 lb (94.348 kg)  BMI 26.69 kg/m2 Well developed and well nourished in no acute distress HENT normal E scleral and icterus clear Neck Supple JVP flat; carotids brisk and full Clear to ausculation  Regular rate and rhythm, no murmurs gallops or rub Soft with active bowel sounds No clubbing cyanosis  Edema Alert and oriented, grossly normal motor and sensory function Skin Warm and Dry    Assessment and  Plan  Ventricular tachycardia  Ischemic Cardiomyopathy  Hypokalemia/magnesiumia   Pt had recurrent VT with shocks.  Appeared to be nonsustained with subsequent sustaining resulting in ICD therapy withATP and then shock  Will recheck electrolytes  He is to see his cardiologist at [facility] and will await his insights as to LV function and perfusion   Advised re [place] driving restrictions

## 2015-05-10 LAB — BASIC METABOLIC PANEL
BUN: 13 mg/dL (ref 7–25)
CALCIUM: 9.4 mg/dL (ref 8.6–10.3)
CHLORIDE: 100 mmol/L (ref 98–110)
CO2: 26 mmol/L (ref 20–31)
CREATININE: 1.03 mg/dL (ref 0.60–1.35)
GLUCOSE: 78 mg/dL (ref 65–99)
Potassium: 4 mmol/L (ref 3.5–5.3)
Sodium: 138 mmol/L (ref 135–146)

## 2015-05-10 LAB — MAGNESIUM: Magnesium: 1.8 mg/dL (ref 1.5–2.5)

## 2015-05-11 NOTE — Progress Notes (Signed)
Patient ID: Jason Hogan, male   DOB: [birth year], 40 y.o.   MRN: [id number]   Jason Hogan is seen todya for F/U of CAD with large Anterior M.I. 2011  and collateralized silent distal RCA occlusion. Initially acute MI care was with Dr Jason Hogan who was the on call STEMI doctor  AICD implant complicated by hematoma  Has single chamber device with no counter so unable to assess palpitations    I have note seen him since 2014.   Seen by Dr Jason Hogan last week after AICD d/c;s  Found to have appropriate ATP and shock for VT. [facility] been seeing [facility] at [facility]  Also seen by ENT there 09/2014 for epistaxis with negative evaluation.  Seen by urology for Low T on clomid but caused irritability and then anastrazole  With improvement in fatigue and libido  Hair and scalp improved off coreg and no rash with cozaar. Did not tolerate aldactone post m.i. with excessive fatiigue and also pruritis but on it now  Has been seeing cardiologist at [facility].  Was on 3 diuretics at time with low K/Mag Labs normalized with replacement on 05/09/15  Mag 1.8  05/09/15    Lab Results  Component Value Date   WBC 7.3 11/18/2014   HGB 14.5 11/18/2014   HCT 43.6 11/18/2014   PLT 255.0 11/18/2014   GLUCOSE 78 05/09/2015   CHOL 222* 11/17/2014   TRIG 125.0 11/17/2014   HDL 33.80* 11/17/2014   LDLDIRECT 102.3 10/05/2012   LDLCALC 163* 11/17/2014   ALT 28 11/17/2014   AST 27 11/17/2014   NA 138 05/09/2015   K 4.0 05/09/2015   CL 100 05/09/2015   CREATININE 1.03 05/09/2015   BUN 13 05/09/2015   CO2 26 05/09/2015   TSH 1.53 11/18/2014   INR 1.07 11/27/2009   HGBA1C 5.4 10/14/2013   Last echo here EF 45-50% in 2013   ROS: Denies fever, malais, weight loss, blurry vision, decreased visual acuity, cough, sputum, SOB, hemoptysis, pleuritic pain, palpitaitons, heartburn, abdominal pain, melena, lower extremity edema, claudication, or rash.  All other systems reviewed and negative  General: Affect appropriate Healthy:   appears stated age HEENT: normal Neck supple with no adenopathy JVP normal no bruits no thyromegaly Lungs clear with no wheezing and good diaphragmatic motion Heart:  S1/S2 no murmur, no rub, gallop or click PMI normal Abdomen: benighn, BS positve, no tenderness, no AAA no bruit.  No HSM or HJR Distal pulses intact with no bruits No edema Neuro non-focal Skin warm and dry No muscular weakness AICD under left clavicle   Current Outpatient Prescriptions  Medication Sig Dispense Refill  . albuterol (PROAIR HFA) 108 (90 BASE) MCG/ACT inhaler 2 puffs every 4 hours as needed only  if your can't catch your breath    . allopurinol (ZYLOPRIM) 300 MG tablet TAKE 1 TABLET BY MOUTH EVERY DAY 90 tablet 1  . aspirin 81 MG tablet Take 81 mg by mouth daily.     [name] atorvastatin (LIPITOR) 20 MG tablet Take 1 tablet (20 mg total) by mouth daily. 90 tablet 3  . cetirizine (ZYRTEC) 10 MG tablet Take 10 mg by mouth daily.    . Coenzyme Q10 (CO Q 10) 100 MG CAPS Take 1 capsule by mouth daily.    . colchicine 0.6 MG tablet TAKE 1 TABLET BY MOUTH TWICE DAILY 60 tablet 0  . EFFIENT 10 MG TABS tablet TAKE 1 TABLET BY MOUTH EVERY DAY 30 tablet 0  . furosemide (LASIX) 40 MG  tablet TAKE 1 TABLET BY MOUTH DAILY 30 tablet 0  . gabapentin (NEURONTIN) 100 MG capsule Take 1 capsule (100 mg total) by mouth at bedtime. 30 capsule 3  . metolazone (ZAROXOLYN) 5 MG tablet TAKE AS NEEDED. NEED APPOINTMENT. 30 tablet 0  . mometasone (NASONEX) 50 MCG/ACT nasal spray Place 2 sprays into the nose daily. As needed 17 g 5  . mometasone-formoterol (DULERA) 200-5 MCG/ACT AERO Inhale 2 puffs into the lungs 2 (two) times daily. 3 Inhaler 3  . potassium chloride SA (K-DUR,KLOR-CON) 20 MEQ tablet TAKE 1 TABLET BY MOUTH TWICE DAILY 180 tablet 3  . spironolactone (ALDACTONE) 25 MG tablet Take 0.5 tablets (12.5 mg total) by mouth daily. 45 tablet 0  . tiZANidine (ZANAFLEX) 4 MG tablet TAKE 1 TABLET(4 MG) BY MOUTH EVERY 6 HOURS AS NEEDED  FOR MUSCLE SPASMS 60 tablet 1  . traMADol (ULTRAM) 50 MG tablet TAKE 1 TABLET BY MOUTH AT BEDTIME AS NEEDED 30 tablet 0  . traZODone (DESYREL) 50 MG tablet TAKE 1 TO 2 TABLET BY MOUTH EVERY NIGHT AT BEDTIME AS NEEDED 180 tablet 3   No current facility-administered medications for this visit.    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  Still feels that he is having arrhythmias  Will give 21 day event monitor f/u  With SK post cath to see if suppressive AAD needed  He has lost a lot of weight and BP is low Cut lasix back and K back as hypokalemia and low Mg may have contributed to VT  CAD: with recent flury of VT needs cath. History of anterior MI and collateralized distal RCA his myovue will be abnormal  Discussed risks Willing To proceed.  Will have radial approach unlike his MI  Lab called scheduled for 3/29  11:30  Labs 3/28 hold lasix day before  CHF: Euvolemic decrease diuretic to see if ARB/BB can be re instituted  Echo to assess EF, LVH and filling pressures  Has had blistering of hands With ACE/coreg before may need to re challenge    Low T:  Off anaprazole and clomid for about a year   Chol: continue lipitor labs with primary    [facility]

## 2015-05-12 ENCOUNTER — Encounter: Payer: Self-pay | Admitting: Cardiovascular Disease

## 2015-05-12 ENCOUNTER — Ambulatory Visit (INDEPENDENT_AMBULATORY_CARE_PROVIDER_SITE_OTHER): Payer: BLUE CROSS/BLUE SHIELD | Admitting: Cardiovascular Disease

## 2015-05-12 VITALS — BP 94/60 | HR 88 | Ht 74.0 in | Wt 206.8 lb

## 2015-05-12 DIAGNOSIS — Z01812 Encounter for preprocedural laboratory examination: Secondary | ICD-10-CM | POA: Diagnosis not present

## 2015-05-12 DIAGNOSIS — I5022 Chronic systolic (congestive) heart failure: Secondary | ICD-10-CM

## 2015-05-12 MED ORDER — FUROSEMIDE 20 MG PO TABS: 20.0000 mg | ORAL_TABLET | Freq: Every day | ORAL | Status: DC

## 2015-05-12 MED ORDER — POTASSIUM CHLORIDE CRYS ER 10 MEQ PO TBCR
10.0000 meq | EXTENDED_RELEASE_TABLET | Freq: Two times a day (BID) | ORAL | Status: DC
Start: 2015-05-12 — End: 2015-11-09

## 2015-05-12 NOTE — Patient Instructions (Addendum)
Medication Instructions:  Your physician has recommended you make the following change in your medication:  1-Decrease Lasix 20 mg by mouth daily 2-Decrease Potassium 10 meq by mouth twice daily  Labwork: Your physician recommends that you return for lab work on 05/16/15 BMET, CBC, and PT/INR  Testing/Procedures: Your physician has requested that you have an echocardiogram. Echocardiography is a painless test that uses sound waves to create images of your heart. It provides your doctor with information about the size and shape of your heart and how well your heart's chambers and valves are working. This procedure takes approximately one hour. There are no restrictions for this procedure.  Your physician has requested that you have a cardiac catheterization. Cardiac catheterization is used to diagnose and/or treat various heart conditions. Doctors may recommend this procedure for a number of different reasons. The most common reason is to evaluate chest pain. Chest pain can be a symptom of coronary artery disease (CAD), and cardiac catheterization can show whether plaque is narrowing or blocking your heart's arteries. This procedure is also used to evaluate the valves, as well as measure the blood flow and oxygen levels in different parts of your heart. For further information please visit [email or web address] Please follow instruction sheet, as given.  Your physician has recommended that you wear an 21 day event monitor. Event monitors are medical devices that record the heart's electrical activity. Doctors most often [place] these monitors to diagnose arrhythmias. Arrhythmias are problems with the speed or rhythm of the heartbeat. The monitor is a small, portable device. You can wear one while you do your normal daily activities. This is usually used to diagnose what is causing palpitations/syncope (passing out).  Follow-Up: Your physician recommends that you schedule a follow-up appointment next  available with Dr. Eden Emms.  If you need a refill on your cardiac medications before your next appointment, please call your pharmacy.

## 2015-05-16 ENCOUNTER — Other Ambulatory Visit (INDEPENDENT_AMBULATORY_CARE_PROVIDER_SITE_OTHER): Payer: BLUE CROSS/BLUE SHIELD | Admitting: *Deleted

## 2015-05-16 ENCOUNTER — Other Ambulatory Visit: Payer: Self-pay | Admitting: Cardiovascular Disease

## 2015-05-16 ENCOUNTER — Ambulatory Visit (HOSPITAL_COMMUNITY)
Admission: RE | Admit: 2015-05-16 | Discharge: 2015-05-16 | Disposition: A | Discharge status: Home / Self Care | Payer: BLUE CROSS/BLUE SHIELD | Source: Ambulatory Visit | Attending: Internal Medicine | Admitting: Internal Medicine

## 2015-05-16 DIAGNOSIS — I5022 Chronic systolic (congestive) heart failure: Secondary | ICD-10-CM | POA: Diagnosis present

## 2015-05-16 DIAGNOSIS — E785 Hyperlipidemia, unspecified: Secondary | ICD-10-CM | POA: Diagnosis not present

## 2015-05-16 DIAGNOSIS — Z01812 Encounter for preprocedural laboratory examination: Secondary | ICD-10-CM | POA: Diagnosis not present

## 2015-05-16 DIAGNOSIS — I340 Nonrheumatic mitral (valve) insufficiency: Secondary | ICD-10-CM | POA: Insufficient documentation

## 2015-05-16 DIAGNOSIS — I252 Old myocardial infarction: Secondary | ICD-10-CM | POA: Diagnosis not present

## 2015-05-16 DIAGNOSIS — Z950 Presence of cardiac pacemaker: Secondary | ICD-10-CM | POA: Insufficient documentation

## 2015-05-16 DIAGNOSIS — R29898 Other symptoms and signs involving the musculoskeletal system: Secondary | ICD-10-CM | POA: Diagnosis not present

## 2015-05-16 DIAGNOSIS — Z87891 Personal history of nicotine dependence: Secondary | ICD-10-CM | POA: Diagnosis not present

## 2015-05-16 LAB — BASIC METABOLIC PANEL
BUN: 13 mg/dL (ref 7–25)
CALCIUM: 9.4 mg/dL (ref 8.6–10.3)
CO2: 28 mmol/L (ref 20–31)
CREATININE: 0.81 mg/dL (ref 0.60–1.35)
Chloride: 100 mmol/L (ref 98–110)
Glucose, Bld: 78 mg/dL (ref 65–99)
Potassium: 3.9 mmol/L (ref 3.5–5.3)
Sodium: 140 mmol/L (ref 135–146)

## 2015-05-16 LAB — CBC WITH DIFFERENTIAL/PLATELET
BASOS ABS: 0 10*3/uL (ref 0.0–0.1)
BASOS PCT: 0 % (ref 0–1)
EOS ABS: 0.2 10*3/uL (ref 0.0–0.7)
EOS PCT: 2 % (ref 0–5)
HCT: 43.4 % (ref 39.0–52.0)
Hemoglobin: 14.6 g/dL (ref 13.0–17.0)
Lymphocytes Relative: 34 % (ref 12–46)
Lymphs Abs: 2.7 10*3/uL (ref 0.7–4.0)
MCH: 27.1 pg (ref 26.0–34.0)
MCHC: 33.6 g/dL (ref 30.0–36.0)
MCV: 80.7 fL (ref 78.0–100.0)
MONOS PCT: 8 % (ref 3–12)
MPV: 9.5 fL (ref 8.6–12.4)
Monocytes Absolute: 0.6 10*3/uL (ref 0.1–1.0)
NEUTROS PCT: 56 % (ref 43–77)
Neutro Abs: 4.4 10*3/uL (ref 1.7–7.7)
PLATELETS: 238 10*3/uL (ref 150–400)
RBC: 5.38 MIL/uL (ref 4.22–5.81)
RDW: 15.4 % (ref 11.5–15.5)
WBC: 7.9 10*3/uL (ref 4.0–10.5)

## 2015-05-16 LAB — PROTIME-INR
INR: 1.01 (ref <1.50)
PROTHROMBIN TIME: 13.4 s (ref 11.6–15.2)

## 2015-05-17 ENCOUNTER — Ambulatory Visit (HOSPITAL_COMMUNITY)
Admission: RE | Admit: 2015-05-17 | Discharge: 2015-05-17 | Disposition: A | Discharge status: Home / Self Care | Payer: BLUE CROSS/BLUE SHIELD | Source: Ambulatory Visit | Attending: Cardiovascular Disease | Admitting: Cardiovascular Disease

## 2015-05-17 ENCOUNTER — Encounter (HOSPITAL_COMMUNITY)
Admission: RE | Disposition: A | Discharge status: Home / Self Care | Payer: Self-pay | Source: Ambulatory Visit | Attending: Cardiovascular Disease

## 2015-05-17 DIAGNOSIS — I2582 Chronic total occlusion of coronary artery: Secondary | ICD-10-CM | POA: Insufficient documentation

## 2015-05-17 DIAGNOSIS — Z7982 Long term (current) use of aspirin: Secondary | ICD-10-CM | POA: Insufficient documentation

## 2015-05-17 DIAGNOSIS — I252 Old myocardial infarction: Secondary | ICD-10-CM | POA: Diagnosis not present

## 2015-05-17 DIAGNOSIS — Z95810 Presence of automatic (implantable) cardiac defibrillator: Secondary | ICD-10-CM | POA: Insufficient documentation

## 2015-05-17 DIAGNOSIS — I5022 Chronic systolic (congestive) heart failure: Secondary | ICD-10-CM | POA: Diagnosis not present

## 2015-05-17 DIAGNOSIS — I2510 Atherosclerotic heart disease of native coronary artery without angina pectoris: Secondary | ICD-10-CM | POA: Diagnosis present

## 2015-05-17 HISTORY — PX: CARDIAC CATHETERIZATION: SHX172

## 2015-05-17 SURGERY — LEFT HEART CATH AND CORONARY ANGIOGRAPHY
Anesthesia: LOCAL

## 2015-05-17 MED ORDER — DIAZEPAM 2 MG PO TABS: 2.0000 mg | ORAL_TABLET | ORAL | Status: DC | PRN

## 2015-05-17 MED ORDER — OXYCODONE-ACETAMINOPHEN 5-325 MG PO TABS
ORAL_TABLET | ORAL | Status: AC
  Administered 2015-05-17: 2 via ORAL
  Filled 2015-05-17: qty 2

## 2015-05-17 MED ORDER — SODIUM CHLORIDE 0.9% FLUSH: 3.0000 mL | Freq: Two times a day (BID) | INTRAVENOUS | Status: DC

## 2015-05-17 MED ORDER — VERAPAMIL HCL 2.5 MG/ML IV SOLN
INTRAVENOUS | Status: AC
  Filled 2015-05-17: qty 2

## 2015-05-17 MED ORDER — MIDAZOLAM HCL 2 MG/2ML IJ SOLN
INTRAMUSCULAR | Status: DC | PRN
  Administered 2015-05-17 (×2): 2 mg via INTRAVENOUS

## 2015-05-17 MED ORDER — FENTANYL CITRATE (PF) 100 MCG/2ML IJ SOLN
INTRAMUSCULAR | Status: DC | PRN
  Administered 2015-05-17: 25 ug via INTRAVENOUS

## 2015-05-17 MED ORDER — MIDAZOLAM HCL 2 MG/2ML IJ SOLN
INTRAMUSCULAR | Status: AC
  Filled 2015-05-17: qty 2

## 2015-05-17 MED ORDER — HEPARIN (PORCINE) IN NACL 2-0.9 UNIT/ML-% IJ SOLN
INTRAMUSCULAR | Status: DC | PRN
  Administered 2015-05-17: 3 mL via INTRA_ARTERIAL

## 2015-05-17 MED ORDER — SODIUM CHLORIDE 0.9 % WEIGHT BASED INFUSION
1.0000 mL/kg/h | INTRAVENOUS | Status: DC
  Administered 2015-05-17: 1 mL/kg/h via INTRAVENOUS

## 2015-05-17 MED ORDER — SODIUM CHLORIDE 0.9 % WEIGHT BASED INFUSION
3.0000 mL/kg/h | INTRAVENOUS | Status: DC
  Administered 2015-05-17: 3 mL/kg/h via INTRAVENOUS

## 2015-05-17 MED ORDER — IOPAMIDOL (ISOVUE-370) INJECTION 76%
INTRAVENOUS | Status: DC | PRN
  Administered 2015-05-17: 110 mL via INTRAVENOUS

## 2015-05-17 MED ORDER — SODIUM CHLORIDE 0.9 % WEIGHT BASED INFUSION: 1.0000 mL/kg/h | INTRAVENOUS | Status: DC

## 2015-05-17 MED ORDER — ASPIRIN 81 MG PO CHEW: 81.0000 mg | CHEWABLE_TABLET | ORAL | Status: DC

## 2015-05-17 MED ORDER — HEPARIN SODIUM (PORCINE) 1000 UNIT/ML IJ SOLN
INTRAMUSCULAR | Status: AC
  Filled 2015-05-17: qty 1

## 2015-05-17 MED ORDER — HEPARIN SODIUM (PORCINE) 1000 UNIT/ML IJ SOLN
INTRAMUSCULAR | Status: DC | PRN
  Administered 2015-05-17: 4500 [IU] via INTRAVENOUS

## 2015-05-17 MED ORDER — HEPARIN (PORCINE) IN NACL 2-0.9 UNIT/ML-% IJ SOLN
INTRAMUSCULAR | Status: DC | PRN
  Administered 2015-05-17: 1000 mL

## 2015-05-17 MED ORDER — LIDOCAINE HCL (PF) 1 % IJ SOLN
INTRAMUSCULAR | Status: AC
  Filled 2015-05-17: qty 30

## 2015-05-17 MED ORDER — PRASUGREL HCL 10 MG PO TABS
10.0000 mg | ORAL_TABLET | Freq: Once | ORAL | Status: AC
  Administered 2015-05-17: 10 mg via ORAL

## 2015-05-17 MED ORDER — SODIUM CHLORIDE 0.9 % IV SOLN: 250.0000 mL | INTRAVENOUS | Status: DC | PRN

## 2015-05-17 MED ORDER — FENTANYL CITRATE (PF) 100 MCG/2ML IJ SOLN
INTRAMUSCULAR | Status: AC
  Filled 2015-05-17: qty 2

## 2015-05-17 MED ORDER — LIDOCAINE HCL (PF) 1 % IJ SOLN
INTRAMUSCULAR | Status: DC | PRN
  Administered 2015-05-17: 2 mL

## 2015-05-17 MED ORDER — SODIUM CHLORIDE 0.9% FLUSH: 3.0000 mL | INTRAVENOUS | Status: DC | PRN

## 2015-05-17 MED ORDER — HEPARIN (PORCINE) IN NACL 2-0.9 UNIT/ML-% IJ SOLN
INTRAMUSCULAR | Status: AC
  Filled 2015-05-17: qty 1000

## 2015-05-17 MED ORDER — PRASUGREL HCL 10 MG PO TABS
ORAL_TABLET | ORAL | Status: AC
  Administered 2015-05-17: 10 mg via ORAL
  Filled 2015-05-17: qty 1

## 2015-05-17 MED ORDER — OXYCODONE-ACETAMINOPHEN 5-325 MG PO TABS
1.0000 | ORAL_TABLET | ORAL | Status: DC | PRN
  Administered 2015-05-17: 2 via ORAL

## 2015-05-17 SURGICAL SUPPLY — 12 items
CATH INFINITI 5 FR JL3.5 (CATHETERS) ×2 IMPLANT
CATH INFINITI 5FR ANG PIGTAIL (CATHETERS) ×2 IMPLANT
CATH INFINITI JR4 5F (CATHETERS) ×2 IMPLANT
CATH SITESEER 5F NTR (CATHETERS) ×2 IMPLANT
DEVICE RAD COMP TR BAND LRG (VASCULAR PRODUCTS) ×2 IMPLANT
GLIDESHEATH SLEND SS 6F .021 (SHEATH) ×2 IMPLANT
KIT HEART LEFT (KITS) ×2 IMPLANT
PACK CARDIAC CATHETERIZATION (CUSTOM PROCEDURE TRAY) ×2 IMPLANT
SYR MEDRAD MARK V 150ML (SYRINGE) ×2 IMPLANT
TRANSDUCER W/STOPCOCK (MISCELLANEOUS) ×2 IMPLANT
TUBING CIL FLEX 10 FLL-RA (TUBING) ×2 IMPLANT
WIRE J 3MM .035X260CM (WIRE) ×2 IMPLANT

## 2015-05-17 NOTE — H&P (View-Only) (Signed)
Patient ID: Jason Hogan, male   DOB: [birth year], 40 y.o.   MRN: [id number]   [facility] is seen todya for F/U of CAD with large Anterior M.I. 2011  and collateralized silent distal RCA occlusion. Initially acute MI care was with Dr [facility] Hogan who was the on call STEMI doctor  AICD implant complicated by hematoma  Has single chamber device with no counter so unable to assess palpitations    I have note seen him since 2014.   Seen by Dr Graciela Husbands last week after AICD d/c;s  Found to have appropriate ATP and shock for VT. Has been seeing [facility] at [place]  Also seen by ENT there 09/2014 for epistaxis with negative evaluation.  Seen by urology for Low T on clomid but caused irritability and then anastrazole  With improvement in fatigue and libido  Hair and scalp improved off coreg and no rash with cozaar. Did not tolerate aldactone post m.i. with excessive fatiigue and also pruritis but on it now  Has been seeing cardiologist at [facility].  Was on 3 diuretics at time with low K/Mag Labs normalized with replacement on 05/09/15  Mag 1.8  05/09/15    Lab Results  Component Value Date   WBC 7.3 11/18/2014   HGB 14.5 11/18/2014   HCT 43.6 11/18/2014   PLT 255.0 11/18/2014   GLUCOSE 78 05/09/2015   CHOL 222* 11/17/2014   TRIG 125.0 11/17/2014   HDL 33.80* 11/17/2014   LDLDIRECT 102.3 10/05/2012   LDLCALC 163* 11/17/2014   ALT 28 11/17/2014   AST 27 11/17/2014   NA 138 05/09/2015   K 4.0 05/09/2015   CL 100 05/09/2015   CREATININE 1.03 05/09/2015   BUN 13 05/09/2015   CO2 26 05/09/2015   TSH 1.53 11/18/2014   INR 1.07 11/27/2009   HGBA1C 5.4 10/14/2013   Last echo here EF 45-50% in 2013   ROS: Denies fever, malais, weight loss, blurry vision, decreased visual acuity, cough, sputum, SOB, hemoptysis, pleuritic pain, palpitaitons, heartburn, abdominal pain, melena, lower extremity edema, claudication, or rash.  All other systems reviewed and negative  General: Affect appropriate Healthy:   appears stated age HEENT: normal Neck supple with no adenopathy JVP normal no bruits no thyromegaly Lungs clear with no wheezing and good diaphragmatic motion Heart:  S1/S2 no murmur, no rub, gallop or click PMI normal Abdomen: benighn, BS positve, no tenderness, no AAA no bruit.  No HSM or HJR Distal pulses intact with no bruits No edema Neuro non-focal Skin warm and dry No muscular weakness AICD under left clavicle   Current Outpatient Prescriptions  Medication Sig Dispense Refill  . albuterol (PROAIR HFA) 108 (90 BASE) MCG/ACT inhaler 2 puffs every 4 hours as needed only  if your can't catch your breath    . allopurinol (ZYLOPRIM) 300 MG tablet TAKE 1 TABLET BY MOUTH EVERY DAY 90 tablet 1  . aspirin 81 MG tablet Take 81 mg by mouth daily.     [name] atorvastatin (LIPITOR) 20 MG tablet Take 1 tablet (20 mg total) by mouth daily. 90 tablet 3  . cetirizine (ZYRTEC) 10 MG tablet Take 10 mg by mouth daily.    . Coenzyme Q10 (CO Q 10) 100 MG CAPS Take 1 capsule by mouth daily.    . colchicine 0.6 MG tablet TAKE 1 TABLET BY MOUTH TWICE DAILY 60 tablet 0  . EFFIENT 10 MG TABS tablet TAKE 1 TABLET BY MOUTH EVERY DAY 30 tablet 0  . furosemide (LASIX) 40 MG  tablet TAKE 1 TABLET BY MOUTH DAILY 30 tablet 0  . gabapentin (NEURONTIN) 100 MG capsule Take 1 capsule (100 mg total) by mouth at bedtime. 30 capsule 3  . metolazone (ZAROXOLYN) 5 MG tablet TAKE AS NEEDED. NEED APPOINTMENT. 30 tablet 0  . mometasone (NASONEX) 50 MCG/ACT nasal spray Place 2 sprays into the nose daily. As needed 17 g 5  . mometasone-formoterol (DULERA) 200-5 MCG/ACT AERO Inhale 2 puffs into the lungs 2 (two) times daily. 3 Inhaler 3  . potassium chloride SA (K-DUR,KLOR-CON) 20 MEQ tablet TAKE 1 TABLET BY MOUTH TWICE DAILY 180 tablet 3  . spironolactone (ALDACTONE) 25 MG tablet Take 0.5 tablets (12.5 mg total) by mouth daily. 45 tablet 0  . tiZANidine (ZANAFLEX) 4 MG tablet TAKE 1 TABLET(4 MG) BY MOUTH EVERY 6 HOURS AS NEEDED  FOR MUSCLE SPASMS 60 tablet 1  . traMADol (ULTRAM) 50 MG tablet TAKE 1 TABLET BY MOUTH AT BEDTIME AS NEEDED 30 tablet 0  . traZODone (DESYREL) 50 MG tablet TAKE 1 TO 2 TABLET BY MOUTH EVERY NIGHT AT BEDTIME AS NEEDED 180 tablet 3   No current facility-administered medications for this visit.    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  Still feels that he is having arrhythmias  Will give 21 day event monitor f/u  With SK post cath to see if suppressive AAD needed  He has lost a lot of weight and BP is low Cut lasix back and K back as hypokalemia and low Mg may have contributed to VT  CAD: with recent flury of VT needs cath. History of anterior MI and collateralized distal RCA his myovue will be abnormal  Discussed risks Willing To proceed.  Will have radial approach unlike his MI  Lab called scheduled for 3/29  11:30  Labs 3/28 hold lasix day before  CHF: Euvolemic decrease diuretic to see if ARB/BB can be re instituted  Echo to assess EF, LVH and filling pressures  Has had blistering of hands With ACE/coreg before may need to re challenge    Low T:  Off anaprazole and clomid for about a year   Chol: continue lipitor labs with primary    [facility]    

## 2015-05-17 NOTE — CV Procedure (Signed)
See full note in Epic

## 2015-05-17 NOTE — Interval H&P Note (Signed)
History and Physical Interval Note:  05/17/2015 9:17 AM  [facility]  has presented today for surgery, with the diagnosis of chronic systolic heart failure  The various methods of treatment have been discussed with the patient and family. After consideration of risks, benefits and other options for treatment, the patient has consented to  Procedure(s): Left Heart Cath and Coronary Angiography (N/A) as a surgical intervention .  The patient's history has been reviewed, patient examined, no change in status, stable for surgery.  I have reviewed the patient's chart and labs.  Questions were answered to the patient's satisfaction.     Charlton Haws

## 2015-05-17 NOTE — Discharge Instructions (Signed)
Radial Site Care °Refer to this sheet in the next few weeks. These instructions provide you with information about caring for yourself after your procedure. Your health care provider may also give you more specific instructions. Your treatment has been planned according to current medical practices, but problems sometimes occur. Call your health care provider if you have any problems or questions after your procedure. °WHAT TO EXPECT AFTER THE PROCEDURE °After your procedure, it is typical to have the following: °· Bruising at the radial site that usually fades within 1-2 weeks. °· Blood collecting in the tissue (hematoma) that may be painful to the touch. It should usually decrease in size and tenderness within 1-2 weeks. °HOME CARE INSTRUCTIONS °· Take medicines only as directed by your health care provider. °· You may shower 24-48 hours after the procedure or as directed by your health care provider. Remove the bandage (dressing) and gently wash the site with plain soap and water. Pat the area dry with a clean towel. Do not rub the site, because this may cause bleeding. °· Do not take baths, swim, or use a hot tub until your health care provider approves. °· Check your insertion site every day for redness, swelling, or drainage. °· Do not apply powder or lotion to the site. °· Do not flex or bend the affected arm for 24 hours or as directed by your health care provider. °· Do not push or pull heavy objects with the affected arm for 24 hours or as directed by your health care provider. °· Do not lift over 10 lb (4.5 kg) for 5 days after your procedure or as directed by your health care provider. °· Ask your health care provider when it is okay to: °¨ Return to work or school. °¨ Resume usual physical activities or sports. °¨ Resume sexual activity. °· Do not drive home if you are discharged the same day as the procedure. Have someone else drive you. °· You may drive 24 hours after the procedure unless otherwise  instructed by your health care provider. °· Do not operate machinery or power tools for 24 hours after the procedure. °· If your procedure was done as an outpatient procedure, which means that you went home the same day as your procedure, a responsible adult should be with you for the first 24 hours after you arrive home. °· Keep all follow-up visits as directed by your health care provider. This is important. °SEEK MEDICAL CARE IF: °· You have a fever. °· You have chills. °· You have increased bleeding from the radial site. Hold pressure on the site. °SEEK IMMEDIATE MEDICAL CARE IF: °· You have unusual pain at the radial site. °· You have redness, warmth, or swelling at the radial site. °· You have drainage (other than a small amount of blood on the dressing) from the radial site. °· The radial site is bleeding, hold steady pressure on the site and call 911. °· Your arm or hand becomes pale, cool, tingly, or numb. °  °This information is not intended to replace advice given to you by your health care provider. Make sure you discuss any questions you have with your health care provider. °  °Document Released: 03/09/2010 Document Revised: 02/25/2014 Document Reviewed: 08/23/2013 °Elsevier Interactive Patient Education ©2016 [employer] ° °

## 2015-05-18 ENCOUNTER — Other Ambulatory Visit: Payer: Self-pay | Admitting: Cardiovascular Disease

## 2015-05-18 ENCOUNTER — Encounter (HOSPITAL_COMMUNITY): Payer: Self-pay | Admitting: Cardiovascular Disease

## 2015-05-18 DIAGNOSIS — I472 Ventricular tachycardia, unspecified: Secondary | ICD-10-CM

## 2015-05-18 DIAGNOSIS — I255 Ischemic cardiomyopathy: Secondary | ICD-10-CM

## 2015-05-18 DIAGNOSIS — Z95810 Presence of automatic (implantable) cardiac defibrillator: Secondary | ICD-10-CM

## 2015-05-19 ENCOUNTER — Telehealth: Payer: Self-pay | Admitting: Cardiology

## 2015-05-19 NOTE — Telephone Encounter (Signed)
Spoke w/ pt and requested that he send a manual transmission w/ his home monitor so his monitor can start updating nightly. Pt verbalized understanding.

## 2015-05-22 ENCOUNTER — Ambulatory Visit (INDEPENDENT_AMBULATORY_CARE_PROVIDER_SITE_OTHER): Payer: BLUE CROSS/BLUE SHIELD

## 2015-05-22 ENCOUNTER — Encounter: Payer: Self-pay | Admitting: Cardiovascular Disease

## 2015-05-22 DIAGNOSIS — Z95810 Presence of automatic (implantable) cardiac defibrillator: Secondary | ICD-10-CM

## 2015-05-22 DIAGNOSIS — I255 Ischemic cardiomyopathy: Secondary | ICD-10-CM | POA: Diagnosis not present

## 2015-05-22 DIAGNOSIS — I472 Ventricular tachycardia: Secondary | ICD-10-CM

## 2015-06-05 ENCOUNTER — Other Ambulatory Visit: Payer: BLUE CROSS/BLUE SHIELD

## 2015-06-05 ENCOUNTER — Ambulatory Visit: Payer: BLUE CROSS/BLUE SHIELD | Admitting: Internal Medicine

## 2015-06-05 ENCOUNTER — Ambulatory Visit (INDEPENDENT_AMBULATORY_CARE_PROVIDER_SITE_OTHER): Payer: BLUE CROSS/BLUE SHIELD | Admitting: Family

## 2015-06-05 ENCOUNTER — Ambulatory Visit: Payer: BLUE CROSS/BLUE SHIELD | Admitting: Family

## 2015-06-05 VITALS — BP 142/100 | HR 83 | Temp 97.8°F | Ht 74.0 in | Wt 212.0 lb

## 2015-06-05 DIAGNOSIS — J029 Acute pharyngitis, unspecified: Secondary | ICD-10-CM

## 2015-06-05 DIAGNOSIS — J309 Allergic rhinitis, unspecified: Secondary | ICD-10-CM

## 2015-06-05 DIAGNOSIS — R062 Wheezing: Secondary | ICD-10-CM

## 2015-06-05 MED ORDER — ALBUTEROL SULFATE HFA 108 (90 BASE) MCG/ACT IN AERS: INHALATION_SPRAY | RESPIRATORY_TRACT | Status: DC

## 2015-06-05 MED ORDER — MOMETASONE FUROATE 50 MCG/ACT NA SUSP: 2.0000 | Freq: Every day | NASAL | Status: DC

## 2015-06-05 NOTE — Progress Notes (Signed)
Pre visit review using our clinic review tool, if applicable. No additional management support is needed unless otherwise documented below in the visit note. 

## 2015-06-05 NOTE — Progress Notes (Signed)
Subjective:    Patient ID: Jason Hogan, male    DOB: 08-29-1975, 40 y.o.   MRN: [id number]   Jason Hogan is a 40 y.o. male who presents today for an acute visit.    HPI Comments: Hasnt been using inhalers in quite some time. Of note, patient was recently in [place] racing cars and felt his defibrillator go off. history of MI. At this time denies exertional chest pain or pressure, numbness or tingling radiating to left arm or jaw, palpitations, dizziness, frequent headaches, changes in vision, or shortness of breath.    Sore Throat  This is a new problem. The current episode started 1 to 4 weeks ago. The problem has been unchanged. Neither side of throat is experiencing more pain than the other. There has been no fever. The pain is mild. Associated symptoms include congestion and coughing. Pertinent negatives include no diarrhea, ear pain, headaches, shortness of breath, trouble swallowing or vomiting. Exposure to: young children. He has tried nothing for the symptoms. The treatment provided mild relief.   Past Medical History  Diagnosis Date  . Myocardial infarction (HCC)   . CAD (coronary artery disease)   . Hyperlipidemia   . Renal insufficiency   . Respiratory failure (HCC)   . Rhabdomyolysis   . Hypercholesteremia   . Migraine headache   . Bacterial pneumonia   . Heart failure   . Depression   . Insomnia   . Elevated blood pressure   . Cholelithiasis   . Asthma   . Renal calculus   . Respiratory failure (HCC)   . Neck pain     right  . URI (upper respiratory infection)   . Anxiety 10/03/2011  . Allergic rhinitis, cause unspecified 10/03/2011  . MYOCARDIAL INFARCTION 09/18/2009    Qualifier: Diagnosis of  By: Kem Parkinson    . Chronic systolic heart failure (HCC) 09/18/2009    Qualifier: Diagnosis of  By: Kem Parkinson    . CARDIOMYOPATHY, ISCHEMIC 10/20/2009    Qualifier: Diagnosis of  By: Graciela Husbands, MD, Ruthann Cancer Ty Hilts   . AUTOMATIC IMPLANTABLE CARDIAC  DEFIBRILLATOR SITU 01/17/2010    Qualifier: Diagnosis of  By: Eden Emms, MD, Harrington Challenger   . ASTHMA 09/20/2006    Qualifier: Diagnosis of  By: Jonny Ruiz MD, Len Blalock   . RENAL INSUFFICIENCY 09/18/2009    Qualifier: Diagnosis of  By: Kem Parkinson    . INSOMNIA-SLEEP DISORDER-UNSPEC 06/22/2007    Qualifier: Diagnosis of  By: Jonny Ruiz MD, Len Blalock   . HYPERCHOLESTEROLEMIA 09/18/2009    Qualifier: Diagnosis of  By: Kem Parkinson    . DEPRESSION 06/22/2007    Qualifier: Diagnosis of  By: Maris Berger   . Rhabdomyolysis 09/18/2009    Qualifier: Diagnosis of  By: Kem Parkinson    . RENAL CALCULUS, HX OF 09/20/2006    Qualifier: Diagnosis of  By: Jonny Ruiz MD, Len Blalock   . MIGRAINE HEADACHE 09/18/2009    Qualifier: Diagnosis of  By: Kem Parkinson    . CHOLELITHIASIS 06/22/2007    Qualifier: Diagnosis of  By: Maris Berger   . Impaired glucose tolerance 10/05/2012  . Low testosterone 10/14/2013   Ace inhibitors; Crestor; Lunesta; and Zocor Mr. Marquez had no medications administered during this visit. Social History  Substance Use Topics  . Smoking status: Former Smoker -- 0.10 packs/day for 2 years    Quit date: 02/18/1994  . Smokeless tobacco: Never Used  . Alcohol Use: Yes   Current Outpatient Prescriptions  on File Prior to Visit  Medication Sig Dispense Refill  . allopurinol (ZYLOPRIM) 300 MG tablet TAKE 1 TABLET BY MOUTH EVERY DAY 90 tablet 1  . aspirin 81 MG tablet Take 81 mg by mouth daily.     [name] atorvastatin (LIPITOR) 20 MG tablet Take 1 tablet (20 mg total) by mouth daily. 90 tablet 3  . cetirizine (ZYRTEC) 10 MG tablet Take 10 mg by mouth daily as needed for allergies.     . Coenzyme Q10 (CO Q 10) 100 MG CAPS Take 1 capsule by mouth daily.    . colchicine 0.6 MG tablet TAKE 1 TABLET BY MOUTH TWICE DAILY 60 tablet 0  . EFFIENT 10 MG TABS tablet TAKE 1 TABLET BY MOUTH EVERY DAY 30 tablet 0  . furosemide (LASIX) 20 MG tablet Take 1 tablet (20 mg total) by mouth daily. 90  tablet 3  . gabapentin (NEURONTIN) 100 MG capsule Take 1 capsule (100 mg total) by mouth at bedtime. 30 capsule 3  . metolazone (ZAROXOLYN) 5 MG tablet TAKE AS NEEDED. NEED APPOINTMENT. 30 tablet 0  . mometasone-formoterol (DULERA) 200-5 MCG/ACT AERO Inhale 2 puffs into the lungs 2 (two) times daily. 3 Inhaler 3  . potassium chloride SA (K-DUR,KLOR-CON) 10 MEQ tablet Take 1 tablet (10 mEq total) by mouth 2 (two) times daily. 180 tablet 3  . spironolactone (ALDACTONE) 25 MG tablet Take 0.5 tablets (12.5 mg total) by mouth daily. 45 tablet 0  . tiZANidine (ZANAFLEX) 4 MG tablet TAKE 1 TABLET(4 MG) BY MOUTH EVERY 6 HOURS AS NEEDED FOR MUSCLE SPASMS 60 tablet 1  . traMADol (ULTRAM) 50 MG tablet TAKE 1 TABLET BY MOUTH AT BEDTIME AS NEEDED (Patient taking differently: TAKE 1 TABLET BY MOUTH AT BEDTIME AS NEEDED for pain) 30 tablet 0  . traZODone (DESYREL) 50 MG tablet TAKE 1 TO 2 TABLET BY MOUTH EVERY NIGHT AT BEDTIME AS NEEDED (Patient taking differently: TAKE 1 TO 2 TABLET BY MOUTH EVERY NIGHT AT BEDTIME AS NEEDED for sleep) 180 tablet 3   No current facility-administered medications on file prior to visit.    Review of Systems  Constitutional: Negative for fever and chills.  HENT: Positive for congestion, sinus pressure and sore throat. Negative for ear pain, rhinorrhea, trouble swallowing and voice change.   Respiratory: Positive for cough and wheezing. Negative for shortness of breath.   Cardiovascular: Positive for palpitations (wearing heart monitor; following with cardiology current). Negative for chest pain.  Gastrointestinal: Negative for nausea, vomiting and diarrhea.  Musculoskeletal: Negative for myalgias.  Neurological: Negative for headaches.      Objective:    BP 142/100 mmHg  Pulse 83  Temp(Src) 97.8 F (36.6 C) (Oral)  Ht  (1.88 m)  Wt 212 lb (96.163 kg)  BMI 27.21 kg/m2  SpO2 97%   Physical Exam  Constitutional: Vital signs are normal. He appears well-developed  and well-nourished.  HENT:  Head: Normocephalic and atraumatic.  Right Ear: Hearing, tympanic membrane, external ear and ear canal normal. No drainage, swelling or tenderness. Tympanic membrane is not injected, not erythematous and not bulging. No middle ear effusion. No decreased hearing is noted.  Left Ear: Hearing, tympanic membrane, external ear and ear canal normal. No drainage, swelling or tenderness. Tympanic membrane is not injected, not erythematous and not bulging.  No middle ear effusion. No decreased hearing is noted.  Nose: Rhinorrhea present. Right sinus exhibits no maxillary sinus tenderness and no frontal sinus tenderness. Left sinus exhibits no maxillary sinus tenderness  and no frontal sinus tenderness.  Mouth/Throat: Uvula is midline and mucous membranes are normal. Posterior oropharyngeal erythema present. No oropharyngeal exudate, posterior oropharyngeal edema or tonsillar abscesses.  Eyes: Conjunctivae are normal.  Cardiovascular: Regular rhythm and normal heart sounds.   Pulmonary/Chest: Effort normal and breath sounds normal. No respiratory distress. He has no wheezes. He has no rhonchi. He has no rales.  Lymphadenopathy:       Head (right side): No submental, no submandibular, no tonsillar, no preauricular, no posterior auricular and no occipital adenopathy present.       Head (left side): No submental, no submandibular, no tonsillar, no preauricular, no posterior auricular and no occipital adenopathy present.    He has no cervical adenopathy.  Neurological: He is alert.  Skin: Skin is warm and dry.  Psychiatric: He has a normal mood and affect. His speech is normal and behavior is normal.  Vitals reviewed.      Assessment & Plan:   1. Sore throat Negative strep throat. Pending culture. Working diagnosis of viral pharyngitis. - POCT Rapid Strep A - Grp A Strep; Future  2. Allergic rhinitis, unspecified allergic rhinitis type  - mometasone (NASONEX) 50 MCG/ACT  nasal spray; Place 2 sprays into the nose daily. As needed  Dispense: 17 g; Refill: 5  3. Wheezing History of asthma. No wheezing during exam today however as patient reports occasional wheezing, would like for him to have another inhaler. - albuterol (PROAIR HFA) 108 (90 Base) MCG/ACT inhaler; 2 puffs every 4 hours as needed only  if your can't catch your breath    I am having Mr. Belson maintain his aspirin, metolazone, EFFIENT, Co Q 10, cetirizine, mometasone-formoterol, spironolactone, allopurinol, atorvastatin, tiZANidine, traZODone, gabapentin, colchicine, traMADol, furosemide, potassium chloride, albuterol, and mometasone.   Meds ordered this encounter  Medications  . albuterol (PROAIR HFA) 108 (90 Base) MCG/ACT inhaler    Sig: 2 puffs every 4 hours as needed only  if your can't catch your breath    Order Specific Question:  Supervising Provider    Answer:  Tresa Garter [[id number]]  . mometasone (NASONEX) 50 MCG/ACT nasal spray    Sig: Place 2 sprays into the nose daily. As needed    Dispense:  17 g    Refill:  5    Order Specific Question:  Supervising Provider    Answer:  Tresa Garter [[id number]]     Start medications as prescribed and explained to patient on After Visit Summary ( AVS). Risks, benefits, and alternatives of the medications and treatment plan prescribed today were discussed, and patient expressed understanding.   Education regarding symptom management and diagnosis given to patient.   Follow-up:Plan follow-up as discussed or as needed if any worsening symptoms or change in condition. No Follow-up on file.   Continue to follow with Oliver Barre, MD for routine health maintenance.   Len Blalock and I agreed with plan.   Rennie Plowman, FNP

## 2015-06-05 NOTE — Patient Instructions (Signed)
  Use inhaler for wheezing and nasocort for cough/drainage.    Sore Throat A sore throat is pain, burning, irritation, or scratchiness of the throat. There is often pain or tenderness when swallowing or talking. A sore throat may be accompanied by other symptoms, such as coughing, sneezing, fever, and swollen neck glands. A sore throat is often the first sign of another sickness, such as a cold, flu, strep throat, or mononucleosis (commonly known as mono). Most sore throats go away without medical treatment. CAUSES  The most common causes of a sore throat include:  A viral infection, such as a cold, flu, or mono.  A bacterial infection, such as strep throat, tonsillitis, or whooping cough.  Seasonal allergies.  Dryness in the air.  Irritants, such as smoke or pollution.  Gastroesophageal reflux disease (GERD). HOME CARE INSTRUCTIONS   Only take over-the-counter medicines as directed by your caregiver.  Drink enough fluids to keep your urine clear or pale yellow.  Rest as needed.  Try using throat sprays, lozenges, or sucking on hard candy to ease any pain (if older than 4 years or as directed).  Sip warm liquids, such as broth, herbal tea, or warm water with honey to relieve pain temporarily. You may also eat or drink cold or frozen liquids such as frozen ice pops.  Gargle with salt water (mix 1 tsp salt with 8 oz of water).  Do not smoke and avoid secondhand smoke.  Put a cool-mist humidifier in your bedroom at night to moisten the air. You can also turn on a hot shower and sit in the bathroom with the door closed for 5-10 minutes. SEEK IMMEDIATE MEDICAL CARE IF:  You have difficulty breathing.  You are unable to swallow fluids, soft foods, or your saliva.  You have increased swelling in the throat.  Your sore throat does not get better in 7 days.  You have nausea and vomiting.  You have a fever or persistent symptoms for more than 2-3 days.  You have a fever and  your symptoms suddenly get worse. MAKE SURE YOU:   Understand these instructions.  Will watch your condition.  Will get help right away if you are not doing well or get worse.   This information is not intended to replace advice given to you by your health care provider. Make sure you discuss any questions you have with your health care provider.   Document Released: 03/14/2004 Document Revised: 02/25/2014 Document Reviewed: 10/13/2011 Elsevier Interactive Patient Education [employer]

## 2015-06-06 LAB — STREP A DNA PROBE: GASP: NOT DETECTED

## 2015-06-06 NOTE — Progress Notes (Signed)
Quick Note:  Hi,   Please let patient know results are normal and to follow up if symptoms persist.   Thank you as always! :)   Margaret, NP  Cell [phone]   ______ 

## 2015-06-09 ENCOUNTER — Telehealth: Payer: Self-pay | Admitting: Cardiology

## 2015-06-09 NOTE — Telephone Encounter (Signed)
Spoke w/ pt and requested that he send a manual transmission b/c his home monitor has not updated in at least 8 days.   

## 2015-06-20 ENCOUNTER — Telehealth: Payer: Self-pay | Admitting: Cardiology

## 2015-06-20 NOTE — Telephone Encounter (Signed)
LMOVM requesting that pt send manual transmission b/c home monitor has not updated in at least 8 days.   

## 2015-06-30 ENCOUNTER — Encounter: Payer: Self-pay | Admitting: Cardiovascular Disease

## 2015-06-30 ENCOUNTER — Ambulatory Visit (INDEPENDENT_AMBULATORY_CARE_PROVIDER_SITE_OTHER): Payer: BLUE CROSS/BLUE SHIELD | Admitting: Cardiovascular Disease

## 2015-06-30 ENCOUNTER — Telehealth: Payer: Self-pay | Admitting: Cardiology

## 2015-06-30 VITALS — BP 124/80 | HR 80 | Ht 74.0 in | Wt 211.2 lb

## 2015-06-30 DIAGNOSIS — I5022 Chronic systolic (congestive) heart failure: Secondary | ICD-10-CM | POA: Diagnosis not present

## 2015-06-30 DIAGNOSIS — I255 Ischemic cardiomyopathy: Secondary | ICD-10-CM

## 2015-06-30 MED ORDER — VALSARTAN 80 MG PO TABS: 80.0000 mg | ORAL_TABLET | Freq: Every day | ORAL | Status: DC

## 2015-06-30 MED ORDER — METOLAZONE 5 MG PO TABS: 5.0000 mg | ORAL_TABLET | Freq: Every day | ORAL | Status: DC | PRN

## 2015-06-30 NOTE — Progress Notes (Signed)
Patient ID: Jason Hogan, male   DOB: 25-Apr-1975, 40 y.o.   MRN: [id number]    [name] is seen todya for F/U of CAD with large Anterior M.I. 2011  and collateralized silent distal RCA occlusion. Initially acute MI care was with Dr Sharyn Lull who was the on call STEMI doctor  AICD implant complicated by hematoma  Has single chamber device with no counter so unable to assess palpitations    I have note seen him since 2014.   Seen by Dr Graciela Husbands last week after AICD d/c;s  Found to have appropriate ATP and shock for VT. Has been seeing Sharyl Nimrod at [place]  Also seen by ENT there 09/2014 for epistaxis with negative evaluation.  Seen by urology for Low T on clomid but caused irritability and then anastrazole  With improvement in fatigue and libido  Hair and scalp improved off coreg and no rash with cozaar. Did not tolerate aldactone post m.i. with excessive fatiigue and also pruritis but on it now  Has been seeing cardiologist at [facility].  Was on 3 diuretics at time with low K/Mag Labs normalized with replacement on 05/09/15  Mag 1.8  05/09/15    Lab Results  Component Value Date   WBC 7.9 05/16/2015   HGB 14.6 05/16/2015   HCT 43.4 05/16/2015   PLT 238 05/16/2015   GLUCOSE 78 05/16/2015   CHOL 222* 11/17/2014   TRIG 125.0 11/17/2014   HDL 33.80* 11/17/2014   LDLDIRECT 102.3 10/05/2012   LDLCALC 163* 11/17/2014   ALT 28 11/17/2014   AST 27 11/17/2014   NA 140 05/16/2015   K 3.9 05/16/2015   CL 100 05/16/2015   CREATININE 0.81 05/16/2015   BUN 13 05/16/2015   CO2 28 05/16/2015   TSH 1.53 11/18/2014   INR 1.01 05/16/2015   HGBA1C 5.4 10/14/2013    Echo 05/16/15 reviewed EF 30-35% Cath 05/17/15  Stable disease   Prox RCA lesion, 25% stenosed.  Mid RCA lesion, 99% stenosed.  Dist RCA lesion, 100% stenosed.  Mid Cx to Dist Cx lesion, 40% stenosed.  Prox LAD to Mid LAD lesion, 10% stenosed. The lesion was previously treated with a stent (unknown type).  Dist LAD lesion, 30%  stenosed.  No LAD stent restenosis No progression of moderate circumflex disease Progression of mid RCA disease but with known CTO distal RCA And left to right collaterals no indication to intervene on mid vessel That would only supply small RV branch Patient not having chest pain Indication for cath was multiple AICD d/c's   Stable ischemic DCM  Monitor 05/22/15 only one tachycardic episode to be reviewed by SK  ROS: Denies fever, malais, weight loss, blurry vision, decreased visual acuity, cough, sputum, SOB, hemoptysis, pleuritic pain, palpitaitons, heartburn, abdominal pain, melena, lower extremity edema, claudication, or rash.  All other systems reviewed and negative  General: Affect appropriate Healthy:  appears stated age HEENT: normal Neck supple with no adenopathy JVP normal no bruits no thyromegaly Lungs clear with no wheezing and good diaphragmatic motion Heart:  S1/S2 no murmur, no rub, gallop or click PMI normal Abdomen: benighn, BS positve, no tenderness, no AAA no bruit.  No HSM or HJR Distal pulses intact with no bruits No edema Neuro non-focal Skin warm and dry No muscular weakness AICD under left clavicle   Current Outpatient Prescriptions  Medication Sig Dispense Refill  . albuterol (PROAIR HFA) 108 (90 Base) MCG/ACT inhaler 2 puffs every 4 hours as needed only  if your can't catch  your breath    . allopurinol (ZYLOPRIM) 300 MG tablet TAKE 1 TABLET BY MOUTH EVERY DAY 90 tablet 1  . aspirin 81 MG tablet Take 81 mg by mouth daily.     [name] atorvastatin (LIPITOR) 20 MG tablet Take 1 tablet (20 mg total) by mouth daily. 90 tablet 3  . cetirizine (ZYRTEC) 10 MG tablet Take 10 mg by mouth daily as needed for allergies.     . Coenzyme Q10 (CO Q 10) 100 MG CAPS Take 1 capsule by mouth daily.    . colchicine 0.6 MG tablet TAKE 1 TABLET BY MOUTH TWICE DAILY 60 tablet 0  . EFFIENT 10 MG TABS tablet TAKE 1 TABLET BY MOUTH EVERY DAY 30 tablet 0  . furosemide (LASIX) 20  MG tablet Take 1 tablet (20 mg total) by mouth daily. 90 tablet 3  . gabapentin (NEURONTIN) 100 MG capsule Take 1 capsule (100 mg total) by mouth at bedtime. 30 capsule 3  . metolazone (ZAROXOLYN) 5 MG tablet Take 1 tablet (5 mg total) by mouth daily as needed (fior edema). 30 tablet 6  . mometasone (NASONEX) 50 MCG/ACT nasal spray Place 2 sprays into the nose daily. As needed 17 g 5  . mometasone-formoterol (DULERA) 200-5 MCG/ACT AERO Inhale 2 puffs into the lungs 2 (two) times daily. 3 Inhaler 3  . potassium chloride SA (K-DUR,KLOR-CON) 10 MEQ tablet Take 1 tablet (10 mEq total) by mouth 2 (two) times daily. 180 tablet 3  . spironolactone (ALDACTONE) 25 MG tablet Take 0.5 tablets (12.5 mg total) by mouth daily. 45 tablet 0  . tiZANidine (ZANAFLEX) 4 MG tablet TAKE 1 TABLET(4 MG) BY MOUTH EVERY 6 HOURS AS NEEDED FOR MUSCLE SPASMS 60 tablet 1  . traMADol (ULTRAM) 50 MG tablet TAKE 1 TABLET BY MOUTH AT BEDTIME AS NEEDED (Patient taking differently: TAKE 1 TABLET BY MOUTH AT BEDTIME AS NEEDED for pain) 30 tablet 0  . traZODone (DESYREL) 50 MG tablet TAKE 1 TO 2 TABLET BY MOUTH EVERY NIGHT AT BEDTIME AS NEEDED (Patient taking differently: TAKE 1 TO 2 TABLET BY MOUTH EVERY NIGHT AT BEDTIME AS NEEDED for sleep) 180 tablet 3  . valsartan (DIOVAN) 80 MG tablet Take 1 tablet (80 mg total) by mouth daily. 30 tablet 3   No current facility-administered medications for this visit.    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  Still feels that he is having arrhythmias But monitor ok Needs f/u with  SK post cath to see if suppressive AAD needed  He has lost a lot of weight and BP is low Cut lasix back and K back as hypokalemia and low Mg may have contributed to VT Cath with no ischemic substrate Monitor with only  one tachycardic episode  ZOX:WRUE with stable CAD Distal RCA was occluded and recent cath vessel occluded more proximally  CHF: EF 30-35% try to add back ARB try valsartan 80 mg told him to adjust down his diuretic if BP low will then need to try to get back on coreg or other beta blocker Low T:  Off anaprazole and clomid for about a year   Chol: continue lipitor labs with primary    Charlton Haws

## 2015-06-30 NOTE — Telephone Encounter (Signed)
Spoke w/ pt and requested that he send a manual transmission b/c his home monitor has not updated in at least 8 days.   

## 2015-06-30 NOTE — Patient Instructions (Signed)
**Note De-Identified Ashyia Schraeder Obfuscation** Medication Instructions:  Start taking Valsartan 80 mg daily-all of your other medications remain the same  Labwork: None  Testing/Procedures: None  Follow-Up: Your physician recommends that you schedule a follow-up appointment in: first available with Dr Graciela Husbands for VT  Your physician recommends that you schedule a follow-up appointment in: First available with Dr Eden Emms     If you need a refill on your cardiac medications before your next appointment, please call your pharmacy.

## 2015-07-04 NOTE — Progress Notes (Signed)
Patient Care Team: Corwin Levins, MD as PCP - General Wendall Stade, MD (Cardiology)   HPI  Jason Hogan is a 40 y.o. male Seen as add on today following ICD discharges.  Initially seen at local ER with low K and Mag He got shocked again the subsequent day, but did not go back to the hospital for lab work;  He has had little restrictions in activity  . Due to the care of [facility]; he recently reestablished with Dr. Jamse Mead. Because of complaints of palpitations he was given a Holter monitor. This was reviewed yesterday. It demonstrates sinus tachycardia.  Device History: ICD implanted 2011 for primary prevention  History of appropriate therapy: Yes   3/17 VT-History of AAD therapy: Yes  ATP and shocks   He is able to exercise most am   Records and Results Reviewed form outside office and device interogations  Past Medical History  Diagnosis Date  . Myocardial infarction (HCC)   . CAD (coronary artery disease)   . Hyperlipidemia   . Renal insufficiency   . Respiratory failure (HCC)   . Rhabdomyolysis   . Hypercholesteremia   . Migraine headache   . Bacterial pneumonia   . Heart failure   . Depression   . Insomnia   . Elevated blood pressure   . Cholelithiasis   . Asthma   . Renal calculus   . Respiratory failure (HCC)   . Neck pain     right  . URI (upper respiratory infection)   . Anxiety 10/03/2011  . Allergic rhinitis, cause unspecified 10/03/2011  . MYOCARDIAL INFARCTION 09/18/2009    Qualifier: Diagnosis of  By: Kem Parkinson    . Chronic systolic heart failure (HCC) 09/18/2009    Qualifier: Diagnosis of  By: Kem Parkinson    . CARDIOMYOPATHY, ISCHEMIC 10/20/2009    Qualifier: Diagnosis of  By: Graciela Husbands, MD, Ruthann Cancer Ty Hilts   . 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 ASTHMA 09/20/2006    Qualifier: Diagnosis of  By: Jonny Ruiz MD, Len Blalock   . RENAL INSUFFICIENCY 09/18/2009      Qualifier: Diagnosis of  By: Kem Parkinson    . INSOMNIA-SLEEP DISORDER-UNSPEC 06/22/2007    Qualifier: Diagnosis of  By: Jonny Ruiz MD, Len Blalock   . HYPERCHOLESTEROLEMIA 09/18/2009    Qualifier: Diagnosis of  By: Kem Parkinson    . DEPRESSION 06/22/2007    Qualifier: Diagnosis of  By: Maris Berger   . Rhabdomyolysis 09/18/2009    Qualifier: Diagnosis of  By: Kem Parkinson    . RENAL CALCULUS, HX OF 09/20/2006    Qualifier: Diagnosis of  By: Jonny Ruiz MD, Len Blalock   . MIGRAINE HEADACHE 09/18/2009    Qualifier: Diagnosis of  By: Kem Parkinson    . CHOLELITHIASIS 06/22/2007    Qualifier: Diagnosis of  By: Maris Berger   . Impaired glucose tolerance 10/05/2012  . Low testosterone 10/14/2013    Past Surgical History  Procedure Laterality Date  . Cardiac catheterization N/A 05/17/2015    Procedure: Left Heart Cath and Coronary Angiography;  Surgeon: Wendall Stade, MD;  Location: [facility] INVASIVE CV LAB;  Service: Cardiovascular;  Laterality: N/A;    Current Outpatient Prescriptions  Medication Sig Dispense Refill  . albuterol (PROAIR HFA) 108 (90 Base) MCG/ACT inhaler 2 puffs every 4 hours as needed only  if your can't catch your breath    .  allopurinol (ZYLOPRIM) 300 MG tablet TAKE 1 TABLET BY MOUTH EVERY DAY 90 tablet 1  . aspirin 81 MG tablet Take 81 mg by mouth daily.     [name] atorvastatin (LIPITOR) 20 MG tablet Take 1 tablet (20 mg total) by mouth daily. 90 tablet 3  . cetirizine (ZYRTEC) 10 MG tablet Take 10 mg by mouth daily as needed for allergies.     . Coenzyme Q10 (CO Q 10) 100 MG CAPS Take 1 capsule by mouth daily.    . colchicine 0.6 MG tablet TAKE 1 TABLET BY MOUTH TWICE DAILY 60 tablet 0  . EFFIENT 10 MG TABS tablet TAKE 1 TABLET BY MOUTH EVERY DAY 30 tablet 0  . furosemide (LASIX) 20 MG tablet Take 1 tablet (20 mg total) by mouth daily. 90 tablet 3  . gabapentin (NEURONTIN) 100 MG capsule Take 1 capsule (100 mg total) by mouth at bedtime. 30 capsule 3  .  metolazone (ZAROXOLYN) 5 MG tablet Take 1 tablet (5 mg total) by mouth daily as needed (fior edema). 30 tablet 6  . mometasone (NASONEX) 50 MCG/ACT nasal spray Place 2 sprays into the nose daily. As needed 17 g 5  . mometasone-formoterol (DULERA) 200-5 MCG/ACT AERO Inhale 2 puffs into the lungs 2 (two) times daily. 3 Inhaler 3  . potassium chloride SA (K-DUR,KLOR-CON) 10 MEQ tablet Take 1 tablet (10 mEq total) by mouth 2 (two) times daily. 180 tablet 3  . spironolactone (ALDACTONE) 25 MG tablet Take 0.5 tablets (12.5 mg total) by mouth daily. 45 tablet 0  . tiZANidine (ZANAFLEX) 4 MG tablet TAKE 1 TABLET(4 MG) BY MOUTH EVERY 6 HOURS AS NEEDED FOR MUSCLE SPASMS 60 tablet 1  . traMADol (ULTRAM) 50 MG tablet Take 50 mg by mouth at bedtime as needed for moderate pain.    . traZODone (DESYREL) 50 MG tablet Take 1 - 2 tablets by mouth at bedtime as needed for sleep.    . valsartan (DIOVAN) 80 MG tablet Take 1 tablet (80 mg total) by mouth daily. 30 tablet 3   No current facility-administered medications for this visit.    Allergies  Allergen Reactions  . Ace Inhibitors Cough  . Crestor [Rosuvastatin]     Sweet craving  . Lunesta [Eszopiclone]     Bitter taste  . Zocor [Simvastatin]     Memory problem      Review of Systems negative except from HPI and PMH  Physical Exam BP 96/72 mmHg  Pulse 72  Ht  (1.88 m)  Wt 216 lb (97.977 kg)  BMI 27.72 kg/m2 Well developed and well nourished in no acute distress HENT normal E scleral and icterus clear Neck Supple JVP flat; carotids brisk and full Clear to ausculation  Regular rate and rhythm, no murmurs gallops or rub Soft with active bowel sounds No clubbing cyanosis  Edema Alert and oriented, grossly normal motor and sensory function Skin Warm and Dry  ECG: Sinus Rhythm              Intervals  19/10/39  Axis 80    Assessment and  Plan  Ventricular tachycardia  Ischemic Cardiomyopathy  Hypokalemia/magnesiumia--resolved    ICD St Jude  The patient's device was interrogated.  The information was reviewed. No changes were made in the programming.    No intercurrent Ventricular tachycardia  Will try and get him on low dose betablocker   Will decrease valsartan 80-40 and add carvedilol 3.125 bid   He is to let us know  over the next week whether he develops symptoms

## 2015-07-05 ENCOUNTER — Encounter: Payer: Self-pay | Admitting: Internal Medicine

## 2015-07-05 ENCOUNTER — Ambulatory Visit (INDEPENDENT_AMBULATORY_CARE_PROVIDER_SITE_OTHER): Payer: BLUE CROSS/BLUE SHIELD | Admitting: Internal Medicine

## 2015-07-05 VITALS — BP 96/72 | HR 72 | Ht 74.0 in | Wt 216.0 lb

## 2015-07-05 DIAGNOSIS — I472 Ventricular tachycardia, unspecified: Secondary | ICD-10-CM

## 2015-07-05 DIAGNOSIS — I255 Ischemic cardiomyopathy: Secondary | ICD-10-CM

## 2015-07-05 DIAGNOSIS — Z95810 Presence of automatic (implantable) cardiac defibrillator: Secondary | ICD-10-CM | POA: Diagnosis not present

## 2015-07-05 MED ORDER — CARVEDILOL 3.125 MG PO TABS: 3.1250 mg | ORAL_TABLET | Freq: Two times a day (BID) | ORAL | Status: DC

## 2015-07-05 NOTE — Patient Instructions (Addendum)
Medication Instructions: 1) Decrease Diovan 80 take 1/2 tablet (40 mg) by mouth once daily 2) Start Coreg (carvedilol) to 3.125 mg one tablet by mouth twice daily  Labwork: - none  Procedures/Testing: - none  Follow-Up: - Remote monitoring is used to monitor your Pacemaker of ICD from home. This monitoring reduces the number of office visits required to check your device to one time per year. It allows [place] to keep an eye on the functioning of your device to ensure it is working properly. You are scheduled for a device check from home on 10/04/15. You may send your transmission at any time that day. If you have a wireless device, the transmission will be sent automatically. After your physician reviews your transmission, you will receive a postcard with your next transmission date.  - Your physician wants you to follow-up in: 6 months with Gypsy Balsam, NP for Dr. Graciela Husbands. You will receive a reminder letter in the mail two months in advance. If you don't receive a letter, please call our office to schedule the follow-up appointment.  Any Additional Special Instructions Will Be Listed Below (If Applicable).     If you need a refill on your cardiac medications before your next appointment, please call your pharmacy.

## 2015-07-06 LAB — CUP PACEART INCLINIC DEVICE CHECK
Battery Remaining Longevity: 58.8
Date Time Interrogation Session: 20170517134224
HIGH POWER IMPEDANCE MEASURED VALUE: 51.9984
Lead Channel Sensing Intrinsic Amplitude: 11.9 mV
Lead Channel Setting Pacing Amplitude: 2.5 V
Lead Channel Setting Pacing Pulse Width: 0.5 ms
Lead Channel Setting Sensing Sensitivity: 0.5 mV
MDC IDC LEAD IMPLANT DT: 20111010
MDC IDC LEAD LOCATION: 753860
MDC IDC LEAD MODEL: 7121
MDC IDC MSMT LEADCHNL RV IMPEDANCE VALUE: 425 Ohm
MDC IDC MSMT LEADCHNL RV PACING THRESHOLD AMPLITUDE: 1 V
MDC IDC MSMT LEADCHNL RV PACING THRESHOLD PULSEWIDTH: 0.5 ms
MDC IDC STAT BRADY RV PERCENT PACED: 0 %
Pulse Gen Serial Number: 615318

## 2015-07-10 ENCOUNTER — Other Ambulatory Visit: Payer: Self-pay | Admitting: Internal Medicine

## 2015-07-11 ENCOUNTER — Telehealth: Payer: Self-pay | Admitting: Cardiology

## 2015-07-11 NOTE — Telephone Encounter (Signed)
Spoke w/ pt and requested that he send a manual transmission b/c his home monitor has not updated in at least 8 days.   

## 2015-07-17 ENCOUNTER — Encounter: Payer: Self-pay | Admitting: Family

## 2015-07-17 ENCOUNTER — Other Ambulatory Visit: Payer: Self-pay | Admitting: Family Medicine

## 2015-07-18 NOTE — Telephone Encounter (Signed)
Refill done.  

## 2015-07-27 ENCOUNTER — Encounter: Payer: Self-pay | Admitting: Internal Medicine

## 2015-07-27 ENCOUNTER — Telehealth: Payer: Self-pay | Admitting: *Deleted

## 2015-07-27 NOTE — Telephone Encounter (Signed)
Spoke to patient about episodes of VT from yesterday- (1) treated successfully with ATP.  Patient admits to having 2 episodes of presyncope while at the gym. He said that with the first episode he experience lightheadedness and tunnel vision x ~30 sec, which was relieved by rest (VT treated with ATP). Then he said he had another episode of tunnel vision, lightheadedness, and chest pressure roughly 10 minutes later, which lasted for several minutes.  Patient states that he noticed a lot of cramps yesterday and believes his K+ was low. He states that he has been taking all of his medications as Rx'd and has not been taking any extra Metolazone lately.   Will discuss episodes with Dr.Klein and notify patient of suggestions.

## 2015-07-27 NOTE — Telephone Encounter (Signed)
Dr.Klein recommended that patient f/u in office on Monday at 3:00pm.  Patient informed about Dr.Klein's recommendations.   Patient aware of driving restriction x 6 months.

## 2015-07-31 ENCOUNTER — Encounter: Payer: Self-pay | Admitting: Internal Medicine

## 2015-07-31 ENCOUNTER — Ambulatory Visit (INDEPENDENT_AMBULATORY_CARE_PROVIDER_SITE_OTHER): Payer: BLUE CROSS/BLUE SHIELD | Admitting: Internal Medicine

## 2015-07-31 VITALS — BP 100/70 | HR 84 | Ht 74.0 in | Wt 218.2 lb

## 2015-07-31 DIAGNOSIS — I472 Ventricular tachycardia, unspecified: Secondary | ICD-10-CM

## 2015-07-31 MED ORDER — CARVEDILOL 3.125 MG PO TABS: ORAL_TABLET | ORAL | Status: DC

## 2015-07-31 NOTE — Patient Instructions (Signed)
Medication Instructions: - Your physician has recommended you make the following change in your medication:  1) Increase coreg (carvedilol) to 3.125 mg two tablets (6.25 mg) in the morning and one tablet by mouth every evening 2) take your spironolactone 12.5 mg a night   Labwork: - none  Procedures/Testing: - none  Follow-Up: - Your physician recommends that you schedule a follow-up appointment in: 3 months with Dr. Graciela Husbands.   Any Additional Special Instructions Will Be Listed Below (If Applicable).     If you need a refill on your cardiac medications before your next appointment, please call your pharmacy.

## 2015-07-31 NOTE — Progress Notes (Signed)
Patient Care Team: Corwin Levins, MD as PCP - General Wendall Stade, MD (Cardiology)   HPI  Jason Hogan is a 40 y.o. male Seen as add on today following ICD discharges.  Initially seen at local ER with low K and Mag He got shocked again the subsequent day, but did not go back to the hospital for lab work;  He has had little restrictions in activity  . Due to the care of [facility]; he recently reestablished with Dr. Jamse Mead. Because of complaints of palpitations he was given a Holter monitor. This was reviewed yesterday. It demonstrates sinus tachycardia.  Device History: ICD implanted 2011 for primary prevention  History of appropriate therapy: Yes   3/17 VT-History of AAD therapy: Yes  ATP and shocks   He is able to exercise most am   Records and Results Reviewed form outside office and device interogations  Past Medical History  Diagnosis Date  . Myocardial infarction (HCC)   . CAD (coronary artery disease)   . Hyperlipidemia   . Renal insufficiency   . Respiratory failure (HCC)   . Rhabdomyolysis   . Hypercholesteremia   . Migraine headache   . Bacterial pneumonia   . Heart failure   . Depression   . Insomnia   . Elevated blood pressure   . Cholelithiasis   . Asthma   . Renal calculus   . Respiratory failure (HCC)   . Neck pain     right  . URI (upper respiratory infection)   . Anxiety 10/03/2011  . Allergic rhinitis, cause unspecified 10/03/2011  . MYOCARDIAL INFARCTION 09/18/2009    Qualifier: Diagnosis of  By: Kem Parkinson    . Chronic systolic heart failure (HCC) 09/18/2009    Qualifier: Diagnosis of  By: Kem Parkinson    . CARDIOMYOPATHY, ISCHEMIC 10/20/2009    Qualifier: Diagnosis of  By: Graciela Husbands, MD, Ruthann Cancer Ty Hilts   . 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 ASTHMA 09/20/2006    Qualifier: Diagnosis of  By: Jonny Ruiz MD, Len Blalock   . RENAL INSUFFICIENCY 09/18/2009      Qualifier: Diagnosis of  By: Kem Parkinson    . INSOMNIA-SLEEP DISORDER-UNSPEC 06/22/2007    Qualifier: Diagnosis of  By: Jonny Ruiz MD, Len Blalock   . HYPERCHOLESTEROLEMIA 09/18/2009    Qualifier: Diagnosis of  By: Kem Parkinson    . DEPRESSION 06/22/2007    Qualifier: Diagnosis of  By: Maris Berger   . Rhabdomyolysis 09/18/2009    Qualifier: Diagnosis of  By: Kem Parkinson    . RENAL CALCULUS, HX OF 09/20/2006    Qualifier: Diagnosis of  By: Jonny Ruiz MD, Len Blalock   . MIGRAINE HEADACHE 09/18/2009    Qualifier: Diagnosis of  By: Kem Parkinson    . CHOLELITHIASIS 06/22/2007    Qualifier: Diagnosis of  By: Maris Berger   . Impaired glucose tolerance 10/05/2012  . Low testosterone 10/14/2013    Past Surgical History  Procedure Laterality Date  . Cardiac catheterization N/A 05/17/2015    Procedure: Left Heart Cath and Coronary Angiography;  Surgeon: Wendall Stade, MD;  Location: [facility] INVASIVE CV LAB;  Service: Cardiovascular;  Laterality: N/A;    Current Outpatient Prescriptions  Medication Sig Dispense Refill  . albuterol (PROAIR HFA) 108 (90 Base) MCG/ACT inhaler 2 puffs every 4 hours as needed only  if your can't catch your breath    .  allopurinol (ZYLOPRIM) 300 MG tablet TAKE 1 TABLET BY MOUTH EVERY DAY 90 tablet 1  . aspirin 81 MG tablet Take 81 mg by mouth daily.     [name] atorvastatin (LIPITOR) 20 MG tablet Take 1 tablet (20 mg total) by mouth daily. 90 tablet 3  . carvedilol (COREG) 3.125 MG tablet Take 1 tablet (3.125 mg total) by mouth 2 (two) times daily with a meal. 60 tablet 6  . cetirizine (ZYRTEC) 10 MG tablet Take 10 mg by mouth daily as needed for allergies.     . Coenzyme Q10 (CO Q 10) 100 MG CAPS Take 1 capsule by mouth daily.    . colchicine 0.6 MG tablet TAKE 1 TABLET BY MOUTH TWICE DAILY 60 tablet 0  . EFFIENT 10 MG TABS tablet TAKE 1 TABLET BY MOUTH EVERY DAY 30 tablet 0  . furosemide (LASIX) 20 MG tablet Take 1 tablet (20 mg total) by mouth daily. 90  tablet 3  . gabapentin (NEURONTIN) 100 MG capsule TAKE 1 CAPSULE(100 MG) BY MOUTH AT BEDTIME 30 capsule 6  . metolazone (ZAROXOLYN) 5 MG tablet Take 1 tablet (5 mg total) by mouth daily as needed (fior edema). 30 tablet 6  . mometasone (NASONEX) 50 MCG/ACT nasal spray Place 2 sprays into the nose daily. As needed 17 g 5  . mometasone-formoterol (DULERA) 200-5 MCG/ACT AERO Inhale 2 puffs into the lungs 2 (two) times daily. 3 Inhaler 3  . potassium chloride SA (K-DUR,KLOR-CON) 10 MEQ tablet Take 1 tablet (10 mEq total) by mouth 2 (two) times daily. 180 tablet 3  . spironolactone (ALDACTONE) 25 MG tablet Take 0.5 tablets (12.5 mg total) by mouth daily. 45 tablet 0  . SYMBICORT 80-4.5 MCG/ACT inhaler INHALE 2 PUFFS INTO THE LUNGS TWICE DAILY AS NEEDED 10.2 g 0  . tiZANidine (ZANAFLEX) 4 MG tablet TAKE 1 TABLET(4 MG) BY MOUTH EVERY 6 HOURS AS NEEDED FOR MUSCLE SPASMS 60 tablet 1  . traMADol (ULTRAM) 50 MG tablet TAKE 1 TABLET BY MOUTH EVERY NIGHT AT BEDTIME AS NEEDED 30 tablet 1  . traZODone (DESYREL) 50 MG tablet Take 1 - 2 tablets by mouth at bedtime as needed for sleep.    . valsartan (DIOVAN) 80 MG tablet Take 1/2 tablet (40 mg) by mouth once daily     No current facility-administered medications for this visit.    Allergies  Allergen Reactions  . Ace Inhibitors Cough  . Crestor [Rosuvastatin]     Sweet craving  . Lunesta [Eszopiclone]     Bitter taste  . Zocor [Simvastatin]     Memory problem      Review of Systems negative except from HPI and PMH  Physical Exam BP 100/70 mmHg  Pulse 84  Ht 6\' 2"  (1.88 m)  Wt 218 lb 3.2 oz (98.975 kg)  BMI 28.00 kg/m2 Well developed and well nourished in no acute distress HENT normal E scleral and icterus clear Neck Supple JVP flat; carotids brisk and full Clear to ausculation  Regular rate and rhythm, no murmurs gallops or rub Soft with active bowel sounds No clubbing cyanosis  Edema Alert and oriented, grossly normal motor and sensory  function Skin Warm and Dry  ECG: Sinus Rhythm  @84  18/10/38 Anterior wall MI Rare PVC reveals that I needed to adhere  Assessment and  Plan  Ventricular tachycardia  Ischemic Cardiomyopathy  Hypokalemia/magnesiumia--resolved   ICD St Jude  The patient's device was interrogated.  The information was reviewed. No changes were made in the programming.  He has had intercurrent monomorphic ventricular tachycardia treated on one occasion successfully with ATP and on the other occasion was classified as SVT is on no therapy was given. We have reprogrammed his device to exclude onset as a criteria for discrimination. We have lowered the VT detection 280 bpm and created a VT 1 therapy zone with only ATP.   It is the patient and family's impression that these episodes occurred in the context of secondary stresses related to a diarrheal illness   We will increase his beta blocker up titration which is limited by his low blood pressure. We have discussed the possibility of catheter ablation of his ventricular tachycardia in the event that he has recurrent VT

## 2015-08-01 ENCOUNTER — Other Ambulatory Visit: Payer: Self-pay | Admitting: Internal Medicine

## 2015-08-07 ENCOUNTER — Ambulatory Visit (INDEPENDENT_AMBULATORY_CARE_PROVIDER_SITE_OTHER): Payer: BLUE CROSS/BLUE SHIELD | Admitting: Cardiovascular Disease

## 2015-08-07 ENCOUNTER — Encounter: Payer: Self-pay | Admitting: Cardiovascular Disease

## 2015-08-07 VITALS — BP 104/70 | HR 86 | Ht 74.0 in | Wt 215.1 lb

## 2015-08-07 DIAGNOSIS — I255 Ischemic cardiomyopathy: Secondary | ICD-10-CM

## 2015-08-07 MED ORDER — CARVEDILOL 6.25 MG PO TABS: 6.2500 mg | ORAL_TABLET | Freq: Two times a day (BID) | ORAL | Status: DC

## 2015-08-07 NOTE — Progress Notes (Signed)
Patient ID: Jason Hogan, male   DOB: [birth year], 40 y.o.   MRN: [id number]    Jason Hogan is seen todya for F/U of CAD with large Anterior M.I. 2011  and collateralized silent distal RCA occlusion. Initially acute MI care was with Dr Sharyn Lull who was the on call STEMI doctor  AICD implant complicated by hematoma  Has single chamber device with no counter so unable to assess palpitations    I have note seen him since 2014.   Seen by Dr Graciela Husbands last week after AICD d/c;s  Found to have appropriate ATP and shock for VT. Has been seeing Sharyl Nimrod at [place]  Also seen by ENT there 09/2014 for epistaxis with negative evaluation.  Seen by urology for Low T on clomid but caused irritability and then anastrazole  With improvement in fatigue and libido  Hair and scalp improved off coreg and no rash with cozaar. Did not tolerate aldactone post m.i. with excessive fatiigue and also pruritis but on it now  Has been seeing cardiologist at [facility].  Was on 3 diuretics at time with low K/Mag Labs normalized with replacement on 05/09/15  Mag 1.8  05/09/15    Lab Results  Component Value Date   WBC 7.9 05/16/2015   HGB 14.6 05/16/2015   HCT 43.4 05/16/2015   PLT 238 05/16/2015   GLUCOSE 78 05/16/2015   CHOL 222* 11/17/2014   TRIG 125.0 11/17/2014   HDL 33.80* 11/17/2014   LDLDIRECT 102.3 10/05/2012   LDLCALC 163* 11/17/2014   ALT 28 11/17/2014   AST 27 11/17/2014   NA 140 05/16/2015   K 3.9 05/16/2015   CL 100 05/16/2015   CREATININE 0.81 05/16/2015   BUN 13 05/16/2015   CO2 28 05/16/2015   TSH 1.53 11/18/2014   INR 1.01 05/16/2015   HGBA1C 5.4 10/14/2013    Echo 05/16/15 reviewed EF 30-35% Cath 05/17/15  Stable disease   Prox RCA lesion, 25% stenosed.  Mid RCA lesion, 99% stenosed.  Dist RCA lesion, 100% stenosed.  Mid Cx to Dist Cx lesion, 40% stenosed.  Prox LAD to Mid LAD lesion, 10% stenosed. The lesion was previously treated with a stent (unknown type).  Dist LAD lesion, 30%  stenosed.  No LAD stent restenosis No progression of moderate circumflex disease Progression of mid RCA disease but with known CTO distal RCA And left to right collaterals no indication to intervene on mid vessel That would only supply small RV branch Patient not having chest pain Indication for cath was multiple AICD d/c's   Stable ischemic DCM    ROS: Denies fever, malais, weight loss, blurry vision, decreased visual acuity, cough, sputum, SOB, hemoptysis, pleuritic pain, palpitaitons, heartburn, abdominal pain, melena, lower extremity edema, claudication, or rash.  All other systems reviewed and negative  General: Affect appropriate Healthy:  appears stated age HEENT: normal Neck supple with no adenopathy JVP normal no bruits no thyromegaly Lungs clear with no wheezing and good diaphragmatic motion Heart:  S1/S2 no murmur, no rub, gallop or click PMI normal Abdomen: benighn, BS positve, no tenderness, no AAA no bruit.  No HSM or HJR Distal pulses intact with no bruits No edema Neuro non-focal Skin warm and dry No muscular weakness AICD under left clavicle   Current Outpatient Prescriptions  Medication Sig Dispense Refill  . albuterol (PROAIR HFA) 108 (90 Base) MCG/ACT inhaler 2 puffs every 4 hours as needed only  if your can't catch your breath    . allopurinol (ZYLOPRIM) 300 MG  tablet TAKE 1 TABLET BY MOUTH EVERY DAY 90 tablet 1  . aspirin 81 MG tablet Take 81 mg by mouth daily.     [name] atorvastatin (LIPITOR) 20 MG tablet Take 1 tablet (20 mg total) by mouth daily. 90 tablet 3  . budesonide-formoterol (SYMBICORT) 80-4.5 MCG/ACT inhaler Inhale 2 puffs into the lungs 2 (two) times daily as needed (wheezing).    . carvedilol (COREG) 6.25 MG tablet Take 1 tablet (6.25 mg total) by mouth 2 (two) times daily with a meal. 60 tablet 11  . cetirizine (ZYRTEC) 10 MG tablet Take 10 mg by mouth daily as needed for allergies.     . Coenzyme Q10 (CO Q 10) 100 MG CAPS Take 1 capsule  by mouth daily.    . colchicine 0.6 MG tablet TAKE 1 TABLET BY MOUTH TWICE DAILY 60 tablet 0  . EFFIENT 10 MG TABS tablet TAKE 1 TABLET BY MOUTH EVERY DAY 30 tablet 0  . furosemide (LASIX) 20 MG tablet Take 1 tablet (20 mg total) by mouth daily. 90 tablet 3  . gabapentin (NEURONTIN) 100 MG capsule TAKE 1 CAPSULE(100 MG) BY MOUTH AT BEDTIME 30 capsule 6  . metolazone (ZAROXOLYN) 5 MG tablet Take 1 tablet (5 mg total) by mouth daily as needed (fior edema). 30 tablet 6  . mometasone (NASONEX) 50 MCG/ACT nasal spray Place 2 sprays into the nose daily as needed (allergies).    . mometasone-formoterol (DULERA) 200-5 MCG/ACT AERO Inhale 2 puffs into the lungs 2 (two) times daily. 3 Inhaler 3  . potassium chloride SA (K-DUR,KLOR-CON) 10 MEQ tablet Take 1 tablet (10 mEq total) by mouth 2 (two) times daily. 180 tablet 3  . spironolactone (ALDACTONE) 25 MG tablet Take 0.5 tablets (12.5 mg total) by mouth daily. 45 tablet 0  . tiZANidine (ZANAFLEX) 4 MG tablet TAKE 1 TABLET BY MOUTH EVERY 6 HOURS AS NEEDED FOR MUSCLE SPASMS 60 tablet 0  . traMADol (ULTRAM) 50 MG tablet Take 50 mg by mouth every 6 (six) hours as needed for moderate pain or severe pain.    . traZODone (DESYREL) 50 MG tablet Take 1 - 2 tablets by mouth at bedtime as needed for sleep.    . valsartan (DIOVAN) 80 MG tablet Take 1/2 tablet (40 mg) by mouth once daily     No current facility-administered medications for this visit.    Allergies  Ace inhibitors; Crestor; Lunesta; and Zocor  Electrocardiogram:  05/09/15  NSR rate 74  biatrial enlargement old anterior MI   QT 390    Assessment and Plan VT/AICD: He has had intercurrent monomorphic ventricular tachycardia treated on one occasion successfully with ATP and on the other occasion was classified as SVT is on no therapy was given. Dr Graciela Husbands reprogrammed his device to exclude onset as a criteria for discrimination. We have lowered the VT detection 280 bpm and created a VT 1 therapy zone with  only ATP.   Increase coreg to 6.25 bid .If he has recurrent episodes may need VT ablation f/u Dr Graciela Husbands   ZOX:WRUE with stable CAD Distal RCA was occluded and recent cath vessel occluded more proximally  CHF: EF 30-35% try to add back ARB try valsartan 80 mg told him to adjust down his diuretic if BP low   Low T:  Off anaprazole and clomid for about a year   Chol: continue lipitor labs with primary    Charlton Haws

## 2015-08-07 NOTE — Patient Instructions (Signed)
Medication Instructions:  INCREASE Carvedilol (Coreg) to 6.25 mg twice daily   Labwork: None Ordered   Testing/Procedures: None Ordered   Follow-Up: Your physician wants you to follow-up in: 3 months with Dr. Eden Emms.  You will receive a reminder letter in the mail two months in advance. If you don't receive a letter, please call our office to schedule the follow-up appointment.   If you need a refill on your cardiac medications before your next appointment, please call your pharmacy.   Thank you for choosing [facility]! Eligha Bridegroom, RN [phone]

## 2015-08-09 ENCOUNTER — Encounter: Payer: BLUE CROSS/BLUE SHIELD | Admitting: Internal Medicine

## 2015-08-30 ENCOUNTER — Encounter: Payer: Self-pay | Admitting: Cardiovascular Disease

## 2015-09-01 ENCOUNTER — Telehealth: Payer: Self-pay | Admitting: Family Medicine

## 2015-09-01 NOTE — Telephone Encounter (Signed)
Requesting script for allopurinol and tizanidine

## 2015-09-04 MED ORDER — TIZANIDINE HCL 4 MG PO TABS: ORAL_TABLET | ORAL | Status: DC

## 2015-09-04 MED ORDER — ALLOPURINOL 300 MG PO TABS: 300.0000 mg | ORAL_TABLET | Freq: Every day | ORAL | Status: DC

## 2015-09-04 NOTE — Telephone Encounter (Signed)
done

## 2015-09-04 NOTE — Telephone Encounter (Signed)
SPOKE WITH   PT . PT  WAS  ABLE TO GET   MAG OX FROM DRUG  STORE WITHOUT  SCRIPT .Jason Hogan

## 2015-09-17 ENCOUNTER — Other Ambulatory Visit: Payer: Self-pay | Admitting: Internal Medicine

## 2015-09-19 ENCOUNTER — Telehealth: Payer: Self-pay | Admitting: Internal Medicine

## 2015-09-19 NOTE — Telephone Encounter (Signed)
Spoke with pharmacist and advised we will not be refilling pt's proair since he has not been seen since 2015  He verbalized understanding and states nothing further needed

## 2015-09-26 ENCOUNTER — Other Ambulatory Visit: Payer: Self-pay | Admitting: Family

## 2015-09-26 DIAGNOSIS — R062 Wheezing: Secondary | ICD-10-CM

## 2015-09-26 MED ORDER — ALBUTEROL SULFATE HFA 108 (90 BASE) MCG/ACT IN AERS: INHALATION_SPRAY | RESPIRATORY_TRACT | 0 refills | Status: DC

## 2015-10-04 ENCOUNTER — Ambulatory Visit (INDEPENDENT_AMBULATORY_CARE_PROVIDER_SITE_OTHER): Payer: BLUE CROSS/BLUE SHIELD | Admitting: *Deleted

## 2015-10-04 DIAGNOSIS — I472 Ventricular tachycardia, unspecified: Secondary | ICD-10-CM

## 2015-10-04 DIAGNOSIS — I255 Ischemic cardiomyopathy: Secondary | ICD-10-CM

## 2015-10-04 NOTE — Progress Notes (Signed)
Remote ICD transmission.   

## 2015-10-05 ENCOUNTER — Encounter: Payer: Self-pay | Admitting: Cardiology

## 2015-10-06 LAB — CUP PACEART REMOTE DEVICE CHECK
Battery Voltage: 2.95 V
Date Time Interrogation Session: 20170816064259
HIGH POWER IMPEDANCE MEASURED VALUE: 55 Ohm
Implantable Lead Model: 7121
Lead Channel Setting Sensing Sensitivity: 0.5 mV
MDC IDC LEAD IMPLANT DT: 20111010
MDC IDC LEAD LOCATION: 753860
MDC IDC MSMT BATTERY REMAINING LONGEVITY: 56 mo
MDC IDC MSMT BATTERY REMAINING PERCENTAGE: 49 %
MDC IDC MSMT LEADCHNL RV IMPEDANCE VALUE: 460 Ohm
MDC IDC MSMT LEADCHNL RV PACING THRESHOLD AMPLITUDE: 1 V
MDC IDC MSMT LEADCHNL RV PACING THRESHOLD PULSEWIDTH: 0.5 ms
MDC IDC MSMT LEADCHNL RV SENSING INTR AMPL: 11.9 mV
MDC IDC SET LEADCHNL RV PACING AMPLITUDE: 2.5 V
MDC IDC SET LEADCHNL RV PACING PULSEWIDTH: 0.5 ms
MDC IDC STAT BRADY RV PERCENT PACED: 1 %
Pulse Gen Serial Number: 615318

## 2015-10-12 ENCOUNTER — Other Ambulatory Visit: Payer: Self-pay | Admitting: Family Medicine

## 2015-10-13 NOTE — Telephone Encounter (Signed)
Refill done.  

## 2015-10-24 ENCOUNTER — Other Ambulatory Visit (INDEPENDENT_AMBULATORY_CARE_PROVIDER_SITE_OTHER): Payer: BLUE CROSS/BLUE SHIELD

## 2015-10-24 ENCOUNTER — Ambulatory Visit (INDEPENDENT_AMBULATORY_CARE_PROVIDER_SITE_OTHER): Payer: BLUE CROSS/BLUE SHIELD | Admitting: Internal Medicine

## 2015-10-24 VITALS — BP 120/72 | HR 80 | Temp 98.0°F | Resp 20 | Wt 219.0 lb

## 2015-10-24 DIAGNOSIS — Z23 Encounter for immunization: Secondary | ICD-10-CM | POA: Diagnosis not present

## 2015-10-24 DIAGNOSIS — Z0189 Encounter for other specified special examinations: Secondary | ICD-10-CM

## 2015-10-24 DIAGNOSIS — R062 Wheezing: Secondary | ICD-10-CM

## 2015-10-24 DIAGNOSIS — E7800 Pure hypercholesterolemia, unspecified: Secondary | ICD-10-CM

## 2015-10-24 DIAGNOSIS — R7989 Other specified abnormal findings of blood chemistry: Secondary | ICD-10-CM | POA: Diagnosis not present

## 2015-10-24 DIAGNOSIS — Z0000 Encounter for general adult medical examination without abnormal findings: Secondary | ICD-10-CM

## 2015-10-24 DIAGNOSIS — R7302 Impaired glucose tolerance (oral): Secondary | ICD-10-CM

## 2015-10-24 LAB — BASIC METABOLIC PANEL
BUN: 23 mg/dL (ref 6–23)
CO2: 33 mEq/L — ABNORMAL HIGH (ref 19–32)
Calcium: 9.6 mg/dL (ref 8.4–10.5)
Chloride: 97 mEq/L (ref 96–112)
Creatinine, Ser: 1.1 mg/dL (ref 0.40–1.50)
GFR: 78.93 mL/min (ref >60.00)
GLUCOSE: 93 mg/dL (ref 70–99)
POTASSIUM: 3.3 meq/L — AB (ref 3.5–5.1)
Sodium: 138 mEq/L (ref 135–145)

## 2015-10-24 LAB — URINALYSIS, ROUTINE W REFLEX MICROSCOPIC
BILIRUBIN URINE: NEGATIVE
Ketones, ur: NEGATIVE
Leukocytes, UA: NEGATIVE
NITRITE: NEGATIVE
PH: 7 (ref 5.0–8.0)
SPECIFIC GRAVITY, URINE: 1.01 (ref 1.000–1.030)
Total Protein, Urine: NEGATIVE
Urine Glucose: NEGATIVE
Urobilinogen, UA: 0.2 (ref 0.0–1.0)
WBC, UA: NONE SEEN (ref 0–?)

## 2015-10-24 LAB — CBC WITH DIFFERENTIAL/PLATELET
BASOS ABS: 0 10*3/uL (ref 0.0–0.1)
Basophils Relative: 0.4 % (ref 0.0–3.0)
EOS ABS: 0.5 10*3/uL (ref 0.0–0.7)
EOS PCT: 6.7 % — AB (ref 0.0–5.0)
HCT: 42.5 % (ref 39.0–52.0)
HEMOGLOBIN: 14.2 g/dL (ref 13.0–17.0)
Lymphocytes Relative: 31.4 % (ref 12.0–46.0)
Lymphs Abs: 2.5 10*3/uL (ref 0.7–4.0)
MCHC: 33.4 g/dL (ref 30.0–36.0)
MCV: 82.3 fl (ref 78.0–100.0)
MONO ABS: 0.7 10*3/uL (ref 0.1–1.0)
Monocytes Relative: 9 % (ref 3.0–12.0)
Neutro Abs: 4.1 10*3/uL (ref 1.4–7.7)
Neutrophils Relative %: 52.5 % (ref 43.0–77.0)
Platelets: 221 10*3/uL (ref 150.0–400.0)
RBC: 5.17 Mil/uL (ref 4.22–5.81)
RDW: 15.2 % (ref 11.5–15.5)
WBC: 7.9 10*3/uL (ref 4.0–10.5)

## 2015-10-24 LAB — LIPID PANEL
CHOLESTEROL: 167 mg/dL (ref 0–200)
HDL: 40.2 mg/dL (ref >39.00)
NonHDL: 127.14
TRIGLYCERIDES: 237 mg/dL — AB (ref 0.0–149.0)
Total CHOL/HDL Ratio: 4
VLDL: 47.4 mg/dL — ABNORMAL HIGH (ref 0.0–40.0)

## 2015-10-24 LAB — HEPATIC FUNCTION PANEL
ALBUMIN: 4.6 g/dL (ref 3.5–5.2)
ALT: 30 U/L (ref 0–53)
AST: 46 U/L — AB (ref 0–37)
Alkaline Phosphatase: 62 U/L (ref 39–117)
Bilirubin, Direct: 0.1 mg/dL (ref 0.0–0.3)
TOTAL PROTEIN: 7.5 g/dL (ref 6.0–8.3)
Total Bilirubin: 0.3 mg/dL (ref 0.2–1.2)

## 2015-10-24 LAB — MAGNESIUM: MAGNESIUM: 1.8 mg/dL (ref 1.5–2.5)

## 2015-10-24 LAB — LDL CHOLESTEROL, DIRECT: LDL DIRECT: 101 mg/dL

## 2015-10-24 LAB — TSH: TSH: 1.13 u[IU]/mL (ref 0.35–4.50)

## 2015-10-24 LAB — PSA: PSA: 0.26 ng/mL (ref 0.10–4.00)

## 2015-10-24 MED ORDER — TIZANIDINE HCL 4 MG PO TABS: ORAL_TABLET | ORAL | 1 refills | Status: DC

## 2015-10-24 MED ORDER — ALBUTEROL SULFATE HFA 108 (90 BASE) MCG/ACT IN AERS: INHALATION_SPRAY | RESPIRATORY_TRACT | 11 refills | Status: DC

## 2015-10-24 MED ORDER — BUDESONIDE-FORMOTEROL FUMARATE 80-4.5 MCG/ACT IN AERO: 2.0000 | INHALATION_SPRAY | Freq: Two times a day (BID) | RESPIRATORY_TRACT | 11 refills | Status: DC | PRN

## 2015-10-24 NOTE — Progress Notes (Signed)
Pre visit review using our clinic review tool, if applicable. No additional management support is needed unless otherwise documented below in the visit note. 

## 2015-10-24 NOTE — Patient Instructions (Addendum)

## 2015-10-24 NOTE — Progress Notes (Signed)
Subjective:    Patient ID: Jason Hogan, male    DOB: 20-May-1975, 40 y.o.   MRN: [id number]  HPI  Here for wellness and f/u;  Overall doing ok;  Pt denies Chest pain, worsening SOB, DOE, wheezing, orthopnea, PND, worsening LE edema, palpitations, dizziness or syncope.  Pt denies neurological change such as new headache, facial or extremity weakness.  Pt denies polydipsia, polyuria, or low sugar symptoms. Pt states overall good compliance with treatment and medications, good tolerability, and has been trying to follow appropriate diet.  Pt denies worsening depressive symptoms, suicidal ideation or panic. No fever, night sweats, wt loss, loss of appetite, or other constitutional symptoms.  Pt states good ability with ADL's, has low fall risk, home safety reviewed and adequate, no other significant changes in hearing or vision, and only occasionally active with exercise. 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 Seen and ts for low k and low mg,  Past Medical History:  Diagnosis Date  . Allergic rhinitis, cause unspecified 10/03/2011  . Anxiety 10/03/2011  . Asthma   . ASTHMA 09/20/2006   Qualifier: Diagnosis of  By: Jonny Ruiz MD, Len Blalock   . 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 Bacterial pneumonia   . CAD (coronary artery disease)   . CARDIOMYOPATHY, ISCHEMIC 10/20/2009   Qualifier: Diagnosis of  By: Graciela Husbands, MD, Ruthann Cancer Ty Hilts   . Cholelithiasis   . CHOLELITHIASIS 06/22/2007   Qualifier: Diagnosis of  By: Maris Berger   . Chronic systolic heart failure (HCC) 09/18/2009   Qualifier: Diagnosis of  By: Kem Parkinson    . Depression   . DEPRESSION 06/22/2007   Qualifier: Diagnosis of  By: Maris Berger   . Elevated blood pressure   . Heart failure   . Hypercholesteremia   . HYPERCHOLESTEROLEMIA 09/18/2009   Qualifier: Diagnosis of  By: Kem Parkinson    . Hyperlipidemia   .  Impaired glucose tolerance 10/05/2012  . Insomnia   . INSOMNIA-SLEEP DISORDER-UNSPEC 06/22/2007   Qualifier: Diagnosis of  By: Jonny Ruiz MD, Len Blalock   . 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 Migraine headache   . MIGRAINE HEADACHE 09/18/2009   Qualifier: Diagnosis of  By: Kem Parkinson    . MYOCARDIAL INFARCTION 09/18/2009   Qualifier: Diagnosis of  By: Kem Parkinson    . Myocardial infarction (HCC)   . Neck pain    right  . Renal calculus   . RENAL CALCULUS, HX OF 09/20/2006   Qualifier: Diagnosis of  By: Jonny Ruiz MD, Len Blalock   . Renal insufficiency   . RENAL INSUFFICIENCY 09/18/2009   Qualifier: Diagnosis of  By: Kem Parkinson    . Respiratory failure (HCC)   . Respiratory failure (HCC)   . Rhabdomyolysis   . Rhabdomyolysis 09/18/2009   Qualifier: Diagnosis of  By: Kem Parkinson    . URI (upper respiratory infection)    Past Surgical History:  Procedure Laterality Date  . CARDIAC CATHETERIZATION N/A 05/17/2015   Procedure: Left Heart Cath and Coronary Angiography;  Surgeon: Wendall Stade, MD;  Location: [facility] INVASIVE CV LAB;  Service: Cardiovascular;  Laterality: N/A;    reports that he quit smoking about 21 years ago. He has a 0.20 pack-year smoking history. He has never used smokeless tobacco. He reports that he drinks alcohol. He reports that he does not use drugs. family history includes Heart attack in his  father. Allergies  Allergen Reactions  . Ace Inhibitors Cough  . Crestor [Rosuvastatin]     Sweet craving  . Lunesta [Eszopiclone]     Bitter taste  . Zocor [Simvastatin]     Memory problem   Current Outpatient Prescriptions on File Prior to Visit  Medication Sig Dispense Refill  . allopurinol (ZYLOPRIM) 300 MG tablet Take 1 tablet (300 mg total) by mouth daily. 90 tablet 1  . aspirin 81 MG tablet Take 81 mg by mouth daily.     [name]. atorvastatin (LIPITOR) 20 MG tablet Take 1 tablet (20 mg total) by mouth daily. 90 tablet 3  . carvedilol (COREG) 6.25 MG tablet Take 1 tablet (6.25  mg total) by mouth 2 (two) times daily with a meal. 60 tablet 11  . cetirizine (ZYRTEC) 10 MG tablet Take 10 mg by mouth daily as needed for allergies.     . Coenzyme Q10 (CO Q 10) 100 MG CAPS Take 1 capsule by mouth daily.    . colchicine 0.6 MG tablet TAKE 1 TABLET BY MOUTH TWICE DAILY 60 tablet 0  . EFFIENT 10 MG TABS tablet TAKE 1 TABLET BY MOUTH EVERY DAY 30 tablet 0  . furosemide (LASIX) 20 MG tablet Take 1 tablet (20 mg total) by mouth daily. 90 tablet 3  . gabapentin (NEURONTIN) 100 MG capsule TAKE 1 CAPSULE(100 MG) BY MOUTH AT BEDTIME 30 capsule 6  . metolazone (ZAROXOLYN) 5 MG tablet Take 1 tablet (5 mg total) by mouth daily as needed (fior edema). 30 tablet 6  . mometasone (NASONEX) 50 MCG/ACT nasal spray Place 2 sprays into the nose daily as needed (allergies).    . mometasone-formoterol (DULERA) 200-5 MCG/ACT AERO Inhale 2 puffs into the lungs 2 (two) times daily. 3 Inhaler 3  . potassium chloride SA (K-DUR,KLOR-CON) 10 MEQ tablet Take 1 tablet (10 mEq total) by mouth 2 (two) times daily. 180 tablet 3  . spironolactone (ALDACTONE) 25 MG tablet Take 0.5 tablets (12.5 mg total) by mouth daily. 45 tablet 0  . SYMBICORT 80-4.5 MCG/ACT inhaler INHALE 2 PUFFS INTO THE LUNGS TWICE DAILY AS NEEDED 10.2 g 0  . traMADol (ULTRAM) 50 MG tablet Take 50 mg by mouth every 6 (six) hours as needed for moderate pain or severe pain.    . traZODone (DESYREL) 50 MG tablet Take 1 - 2 tablets by mouth at bedtime as needed for sleep.    . valsartan (DIOVAN) 80 MG tablet Take 1/2 tablet (40 mg) by mouth once daily     No current facility-administered medications on file prior to visit.     Review of Systems Constitutional: Negative for increased diaphoresis, or other activity, appetite or siginficant weight change other than noted HENT: Negative for worsening hearing loss, ear pain, facial swelling, mouth sores and neck stiffness.   Eyes: Negative for other worsening pain, redness or visual disturbance.    Respiratory: Negative for choking or stridor Cardiovascular: Negative for other chest pain and palpitations.  Gastrointestinal: Negative for worsening diarrhea, blood in stool, or abdominal distention Genitourinary: Negative for hematuria, flank pain or change in urine volume.  Musculoskeletal: Negative for myalgias or other joint complaints.  Skin: Negative for other color change and wound or drainage.  Neurological: Negative for syncope and numbness. other than noted Hematological: Negative for adenopathy. or other swelling Psychiatric/Behavioral: Negative for hallucinations, SI, self-injury, decreased concentration or other worsening agitation.      Objective:   Physical Exam BP 120/72   Pulse 80  Temp 98 F (36.7 C) (Oral)   Resp 20   Wt 219 lb (99.3 kg)   SpO2 98%   BMI 28.12 kg/m  VS noted,  Constitutional: Pt is oriented to person, place, and time. Appears well-developed and well-nourished, in no significant distress Head: Normocephalic and atraumatic  Eyes: Conjunctivae and EOM are normal. Pupils are equal, round, and reactive to light Right Ear: External ear normal.  Left Ear: External ear normal Nose: Nose normal.  Mouth/Throat: Oropharynx is clear and moist  Neck: Normal range of motion. Neck supple. No JVD present. No tracheal deviation present or significant neck LA or mass Cardiovascular: Normal rate, regular rhythm, normal heart sounds and intact distal pulses.   Pulmonary/Chest: Effort normal and breath sounds without rales or wheezing  Abdominal: Soft. Bowel sounds are normal. NT. No HSM  Musculoskeletal: Normal range of motion. Exhibits no edema Lymphadenopathy: Has no cervical adenopathy.  Neurological: Pt is alert and oriented to person, place, and time. Pt has normal reflexes. No cranial nerve deficit. Motor grossly intact Skin: Skin is warm and dry. No rash noted or new ulcers Psychiatric:  Has normal mood and affect. Behavior is normal.     Assessment  & Plan:

## 2015-10-25 ENCOUNTER — Encounter: Payer: Self-pay | Admitting: Internal Medicine

## 2015-10-29 NOTE — Assessment & Plan Note (Signed)

## 2015-10-29 NOTE — Assessment & Plan Note (Signed)
stable overall by history and exam, recent data reviewed with pt, and pt to continue medical treatment as before,  to f/u any worsening symptoms or concerns Lab Results  Component Value Date   HGBA1C 5.4 10/14/2013

## 2015-10-29 NOTE — Assessment & Plan Note (Signed)
stable overall by history and exam, recent data reviewed with pt, and pt to continue medical treatment as before,  to f/u any worsening symptoms or concerns Lab Results  Component Value Date   LDLCALC 163 (H) 11/17/2014   To re-start lipitor, encouraged compliance

## 2015-11-09 ENCOUNTER — Ambulatory Visit (INDEPENDENT_AMBULATORY_CARE_PROVIDER_SITE_OTHER): Payer: BLUE CROSS/BLUE SHIELD | Admitting: Internal Medicine

## 2015-11-09 ENCOUNTER — Encounter: Payer: Self-pay | Admitting: Internal Medicine

## 2015-11-09 VITALS — BP 110/62 | HR 81 | Ht 74.0 in | Wt 216.0 lb

## 2015-11-09 DIAGNOSIS — I255 Ischemic cardiomyopathy: Secondary | ICD-10-CM

## 2015-11-09 DIAGNOSIS — I472 Ventricular tachycardia, unspecified: Secondary | ICD-10-CM

## 2015-11-09 DIAGNOSIS — Z95810 Presence of automatic (implantable) cardiac defibrillator: Secondary | ICD-10-CM | POA: Diagnosis not present

## 2015-11-09 LAB — CUP PACEART INCLINIC DEVICE CHECK
Brady Statistic RV Percent Paced: 0 %
HighPow Impedance: 50.3438
Implantable Lead Implant Date: 20111010
Implantable Lead Location: 753860
Lead Channel Pacing Threshold Pulse Width: 0.5 ms
Lead Channel Pacing Threshold Pulse Width: 0.5 ms
Lead Channel Setting Pacing Amplitude: 2.5 V
Lead Channel Setting Pacing Pulse Width: 0.5 ms
MDC IDC LEAD MODEL: 7121
MDC IDC MSMT BATTERY REMAINING LONGEVITY: 54
MDC IDC MSMT LEADCHNL RV IMPEDANCE VALUE: 425 Ohm
MDC IDC MSMT LEADCHNL RV PACING THRESHOLD AMPLITUDE: 1 V
MDC IDC MSMT LEADCHNL RV PACING THRESHOLD AMPLITUDE: 1 V
MDC IDC MSMT LEADCHNL RV SENSING INTR AMPL: 11.9 mV
MDC IDC PG SERIAL: 615318
MDC IDC SESS DTM: 20170921172331
MDC IDC SET LEADCHNL RV SENSING SENSITIVITY: 0.5 mV

## 2015-11-09 MED ORDER — SPIRONOLACTONE 25 MG PO TABS: 25.0000 mg | ORAL_TABLET | Freq: Every day | ORAL | 0 refills | Status: DC

## 2015-11-09 NOTE — Patient Instructions (Addendum)
Medication Instructions:  Your physician has recommended you make the following change in your medication:  1) Increase Spironolactone to 25 mg daily     Labwork: Your physician recommends that you return for lab work in: 2 weeks :BMP    Testing/Procedures: None ordered   Follow-Up: Your physician wants you to follow-up in: 6 months with Yevette Edwards, PA and 12 months with Dr Logan Bores will receive a reminder letter in the mail two months in advance. If you don't receive a letter, please call our office to schedule the follow-up appointment.   Remote monitoring is used to monitor your ICD from home. This monitoring reduces the number of office visits required to check your device to one time per year. It allows [place] to keep an eye on the functioning of your device to ensure it is working properly. You are scheduled for a device check from home on 02/08/16. You may send your transmission at any time that day. If you have a wireless device, the transmission will be sent automatically. After your physician reviews your transmission, you will receive a postcard with your next transmission date.  [name]

## 2015-11-09 NOTE — Progress Notes (Signed)
Patient Care Team: Corwin Levins, MD as PCP - General Wendall Stade, MD (Cardiology)   HPI  Jason Hogan is a 40 y.o. male Seen as add on today following ICD discharges.  Initially seen at local ER with low K and Mag He got shocked again the subsequent day, but did not go back to the hospital for lab work;  He has had little restrictions in activity  . Due to the care of [facility]; he recently reestablished with Dr. Jamse Mead.     Device History: ICD implanted 2011 for primary prevention  History of appropriate therapy: Yes   3/17 [place]-History of AAD therapy: Yes  ATP and shocks   He is able to exercise most ams--he is back to riding motorcycles albeit using a heart rate monitor.  Records and Results Reviewed form outside office and device interogations  Past Medical History:  Diagnosis Date  . Allergic rhinitis, cause unspecified 10/03/2011  . Anxiety 10/03/2011  . Asthma   . ASTHMA 09/20/2006   Qualifier: Diagnosis of  By: Jonny Ruiz MD, Len Blalock   . 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 Bacterial pneumonia   . CAD (coronary artery disease)   . CARDIOMYOPATHY, ISCHEMIC 10/20/2009   Qualifier: Diagnosis of  By: Graciela Husbands, MD, Ruthann Cancer Ty Hilts   . Cholelithiasis   . CHOLELITHIASIS 06/22/2007   Qualifier: Diagnosis of  By: Maris Berger   . Chronic systolic heart failure (HCC) 09/18/2009   Qualifier: Diagnosis of  By: Kem Parkinson    . Depression   . DEPRESSION 06/22/2007   Qualifier: Diagnosis of  By: Maris Berger   . Elevated blood pressure   . Heart failure   . Hypercholesteremia   . HYPERCHOLESTEROLEMIA 09/18/2009   Qualifier: Diagnosis of  By: Kem Parkinson    . Hyperlipidemia   . Impaired glucose tolerance 10/05/2012  . Insomnia   . INSOMNIA-SLEEP DISORDER-UNSPEC 06/22/2007   Qualifier: Diagnosis of  By: Jonny Ruiz MD, Len Blalock   . 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 Migraine  headache   . MIGRAINE HEADACHE 09/18/2009   Qualifier: Diagnosis of  By: Kem Parkinson    . MYOCARDIAL INFARCTION 09/18/2009   Qualifier: Diagnosis of  By: Kem Parkinson    . Myocardial infarction (HCC)   . Neck pain    right  . Renal calculus   . RENAL CALCULUS, HX OF 09/20/2006   Qualifier: Diagnosis of  By: Jonny Ruiz MD, Len Blalock   . Renal insufficiency   . RENAL INSUFFICIENCY 09/18/2009   Qualifier: Diagnosis of  By: Kem Parkinson    . Respiratory failure (HCC)   . Respiratory failure (HCC)   . Rhabdomyolysis   . Rhabdomyolysis 09/18/2009   Qualifier: Diagnosis of  By: Kem Parkinson    . URI (upper respiratory infection)     Past Surgical History:  Procedure Laterality Date  . CARDIAC CATHETERIZATION N/A 05/17/2015   Procedure: Left Heart Cath and Coronary Angiography;  Surgeon: Wendall Stade, MD;  Location: [facility] INVASIVE CV LAB;  Service: Cardiovascular;  Laterality: N/A;    Current Outpatient Prescriptions  Medication Sig Dispense Refill  . albuterol (PROAIR HFA) 108 (90 Base) MCG/ACT inhaler 2 puffs every 4 hours as needed only  if your can't catch your breath 1 Inhaler 11  . allopurinol (ZYLOPRIM) 300 MG tablet Take 1 tablet (300 mg total) by mouth daily. 90 tablet 1  .  aspirin 81 MG tablet Take 81 mg by mouth daily.     [name]. atorvastatin (LIPITOR) 20 MG tablet Take 1 tablet (20 mg total) by mouth daily. 90 tablet 3  . budesonide-formoterol (SYMBICORT) 80-4.5 MCG/ACT inhaler Inhale 2 puffs into the lungs 2 (two) times daily as needed (wheezing). 1 Inhaler 11  . carvedilol (COREG) 6.25 MG tablet Take 1 tablet (6.25 mg total) by mouth 2 (two) times daily with a meal. 60 tablet 11  . cetirizine (ZYRTEC) 10 MG tablet Take 10 mg by mouth daily as needed for allergies.     . Coenzyme Q10 (CO Q 10) 100 MG CAPS Take 1 capsule by mouth daily.    . colchicine 0.6 MG tablet TAKE 1 TABLET BY MOUTH TWICE DAILY 60 tablet 0  . EFFIENT 10 MG TABS tablet TAKE 1 TABLET BY MOUTH EVERY DAY 30  tablet 0  . furosemide (LASIX) 20 MG tablet Take 1 tablet (20 mg total) by mouth daily. 90 tablet 3  . gabapentin (NEURONTIN) 100 MG capsule TAKE 1 CAPSULE(100 MG) BY MOUTH AT BEDTIME 30 capsule 6  . metolazone (ZAROXOLYN) 5 MG tablet Take 1 tablet (5 mg total) by mouth daily as needed (fior edema). 30 tablet 6  . mometasone (NASONEX) 50 MCG/ACT nasal spray Place 2 sprays into the nose daily as needed (allergies).    . mometasone-formoterol (DULERA) 200-5 MCG/ACT AERO Inhale 2 puffs into the lungs 2 (two) times daily. 3 Inhaler 3  . potassium chloride SA (K-DUR,KLOR-CON) 20 MEQ tablet Take 40 mEq by mouth daily.    [name]. spironolactone (ALDACTONE) 25 MG tablet Take 0.5 tablets (12.5 mg total) by mouth daily. 45 tablet 0  . SYMBICORT 80-4.5 MCG/ACT inhaler INHALE 2 PUFFS INTO THE LUNGS TWICE DAILY AS NEEDED 10.2 g 0  . tiZANidine (ZANAFLEX) 4 MG tablet TAKE 1 TABLET BY MOUTH EVERY 6 HOURS AS NEEDED FOR MUSCLE SPASMS 60 tablet 1  . traZODone (DESYREL) 50 MG tablet Take 1 - 2 tablets by mouth at bedtime as needed for sleep.    . valsartan (DIOVAN) 80 MG tablet Take 1/2 tablet (40 mg) by mouth once daily     No current facility-administered medications for this visit.     Allergies  Allergen Reactions  . Ace Inhibitors Cough  . Crestor [Rosuvastatin]     Sweet craving  . Lunesta [Eszopiclone]     Bitter taste  . Zocor [Simvastatin]     Memory problem      Review of Systems negative except from HPI and PMH  Physical Exam BP 110/62   Pulse 81   Ht 6\' 2"  (1.88 m)   Wt 216 lb (98 kg)   SpO2 95%   BMI 27.73 kg/m  Well developed and well nourished in no acute distress HENT normal E scleral and icterus clear Neck Supple JVP flat; carotids brisk and full Clear to ausculation  Regular rate and rhythm, no murmurs gallops or rub Soft with active bowel sounds No clubbing cyanosis  Edema Alert and oriented, grossly normal motor and sensory function Skin Warm and Dry  ECG: Sinus Rhythm   @78  19/10/40 Anterior wall MI Rare PVC reveals that I needed to adhere  Assessment and  Plan  Ventricular tachycardia  Ischemic Cardiomyopathy  Hypokalemia   ICD St Jude  The patient's device was interrogated.  The information was reviewed. No changes were made in the programming.    Without symptoms of ischemia  No intercurrent Ventricular tachycardia  With his hypokalemia,  we will increase his Aldactone. We will need to reassess his potassium and a couple of weeks. He may need augmented supplementation  Will increase mg supplements to two a day

## 2015-11-21 ENCOUNTER — Encounter: Payer: 59 | Admitting: Internal Medicine

## 2015-11-23 ENCOUNTER — Other Ambulatory Visit: Payer: BLUE CROSS/BLUE SHIELD | Admitting: *Deleted

## 2015-11-23 DIAGNOSIS — I472 Ventricular tachycardia, unspecified: Secondary | ICD-10-CM

## 2015-11-23 LAB — BASIC METABOLIC PANEL
BUN: 19 mg/dL (ref 7–25)
CHLORIDE: 97 mmol/L — AB (ref 98–110)
CO2: 29 mmol/L (ref 20–31)
Calcium: 9.8 mg/dL (ref 8.6–10.3)
Creat: 1.1 mg/dL (ref 0.60–1.35)
Glucose, Bld: 96 mg/dL (ref 65–99)
POTASSIUM: 3.6 mmol/L (ref 3.5–5.3)
Sodium: 136 mmol/L (ref 135–146)

## 2015-11-28 ENCOUNTER — Other Ambulatory Visit: Payer: Self-pay

## 2015-11-28 ENCOUNTER — Other Ambulatory Visit: Payer: Self-pay | Admitting: Internal Medicine

## 2015-11-28 MED ORDER — VALSARTAN 80 MG PO TABS: ORAL_TABLET | ORAL | 3 refills | Status: DC

## 2015-11-29 ENCOUNTER — Other Ambulatory Visit: Payer: Self-pay | Admitting: *Deleted

## 2015-11-29 MED ORDER — POTASSIUM CHLORIDE CRYS ER 20 MEQ PO TBCR: 40.0000 meq | EXTENDED_RELEASE_TABLET | Freq: Every day | ORAL | 11 refills | Status: DC

## 2015-11-30 ENCOUNTER — Other Ambulatory Visit: Payer: Self-pay | Admitting: Internal Medicine

## 2015-11-30 NOTE — Telephone Encounter (Signed)
Order Providers   Prescribing Provider Encounter Provider  Duke Salvia, MD Vernelle Emerald, CMA  Medication Detail    Disp Refills Start End   potassium chloride SA (K-DUR,KLOR-CON) 20 MEQ tablet 60 tablet 11 11/29/2015    Sig - Route: Take 2 tablets (40 mEq total) by mouth daily. - Oral   E-Prescribing Status: Receipt confirmed by pharmacy (11/29/2015 10:28 AM EDT)   Pharmacy   [id number] - [place], [place] - [address]

## 2015-12-27 ENCOUNTER — Other Ambulatory Visit: Payer: Self-pay | Admitting: Family Medicine

## 2015-12-27 ENCOUNTER — Other Ambulatory Visit: Payer: Self-pay | Admitting: Internal Medicine

## 2015-12-27 DIAGNOSIS — R062 Wheezing: Secondary | ICD-10-CM

## 2015-12-29 ENCOUNTER — Other Ambulatory Visit: Payer: Self-pay | Admitting: Internal Medicine

## 2016-01-10 ENCOUNTER — Telehealth: Payer: Self-pay | Admitting: Cardiology

## 2016-01-10 NOTE — Telephone Encounter (Signed)
Spoke w/ pt and requested that he send a manual transmission b/c his home monitor has not updated in at least 7 days.   

## 2016-01-26 ENCOUNTER — Other Ambulatory Visit: Payer: Self-pay | Admitting: Family Medicine

## 2016-01-26 NOTE — Telephone Encounter (Signed)
Refill done.  

## 2016-02-07 ENCOUNTER — Telehealth: Payer: Self-pay | Admitting: Cardiology

## 2016-02-07 ENCOUNTER — Ambulatory Visit (INDEPENDENT_AMBULATORY_CARE_PROVIDER_SITE_OTHER): Payer: BLUE CROSS/BLUE SHIELD | Admitting: *Deleted

## 2016-02-07 DIAGNOSIS — I255 Ischemic cardiomyopathy: Secondary | ICD-10-CM

## 2016-02-07 NOTE — Telephone Encounter (Signed)
Spoke with pt and reminded pt of remote transmission that is due today. Pt verbalized understanding.   

## 2016-02-08 NOTE — Progress Notes (Signed)
Remote ICD transmission.   

## 2016-02-13 ENCOUNTER — Other Ambulatory Visit: Payer: Self-pay | Admitting: Internal Medicine

## 2016-02-14 ENCOUNTER — Other Ambulatory Visit: Payer: Self-pay | Admitting: *Deleted

## 2016-02-14 MED ORDER — METOLAZONE 5 MG PO TABS: 5.0000 mg | ORAL_TABLET | Freq: Every day | ORAL | 2 refills | Status: DC | PRN

## 2016-02-16 LAB — CUP PACEART REMOTE DEVICE CHECK
Battery Remaining Percentage: 46 %
Battery Voltage: 2.93 V
Date Time Interrogation Session: 20171221093012
HIGH POWER IMPEDANCE MEASURED VALUE: 51 Ohm
Implantable Pulse Generator Implant Date: 20111010
Lead Channel Impedance Value: 430 Ohm
Lead Channel Setting Pacing Amplitude: 2.5 V
Lead Channel Setting Sensing Sensitivity: 0.5 mV
MDC IDC LEAD IMPLANT DT: 20111010
MDC IDC LEAD LOCATION: 753860
MDC IDC LEAD MODEL: 7121
MDC IDC MSMT BATTERY REMAINING LONGEVITY: 52 mo
MDC IDC MSMT LEADCHNL RV PACING THRESHOLD AMPLITUDE: 1 V
MDC IDC MSMT LEADCHNL RV PACING THRESHOLD PULSEWIDTH: 0.5 ms
MDC IDC MSMT LEADCHNL RV SENSING INTR AMPL: 11.9 mV
MDC IDC PG SERIAL: 615318
MDC IDC SET LEADCHNL RV PACING PULSEWIDTH: 0.5 ms
MDC IDC STAT BRADY RV PERCENT PACED: 1 %

## 2016-03-23 ENCOUNTER — Other Ambulatory Visit: Payer: Self-pay | Admitting: Family Medicine

## 2016-03-23 ENCOUNTER — Other Ambulatory Visit: Payer: Self-pay | Admitting: Internal Medicine

## 2016-05-06 ENCOUNTER — Ambulatory Visit (INDEPENDENT_AMBULATORY_CARE_PROVIDER_SITE_OTHER): Payer: BLUE CROSS/BLUE SHIELD | Admitting: *Deleted

## 2016-05-06 DIAGNOSIS — I255 Ischemic cardiomyopathy: Secondary | ICD-10-CM | POA: Diagnosis not present

## 2016-05-06 NOTE — Progress Notes (Signed)
Remote ICD transmission.   

## 2016-05-07 LAB — CUP PACEART REMOTE DEVICE CHECK
Battery Remaining Percentage: 44 %
Date Time Interrogation Session: 20180319122254
HighPow Impedance: 47 Ohm
Implantable Lead Implant Date: 20111010
Implantable Lead Location: 753860
Lead Channel Sensing Intrinsic Amplitude: 9.9 mV
Lead Channel Setting Pacing Amplitude: 2.5 V
MDC IDC MSMT BATTERY REMAINING LONGEVITY: 49 mo
MDC IDC MSMT BATTERY VOLTAGE: 2.93 V
MDC IDC MSMT LEADCHNL RV IMPEDANCE VALUE: 380 Ohm
MDC IDC MSMT LEADCHNL RV PACING THRESHOLD AMPLITUDE: 1 V
MDC IDC MSMT LEADCHNL RV PACING THRESHOLD PULSEWIDTH: 0.5 ms
MDC IDC PG IMPLANT DT: 20111010
MDC IDC PG SERIAL: 615318
MDC IDC SET LEADCHNL RV PACING PULSEWIDTH: 0.5 ms
MDC IDC SET LEADCHNL RV SENSING SENSITIVITY: 0.5 mV
MDC IDC STAT BRADY RV PERCENT PACED: 1 %

## 2016-05-08 ENCOUNTER — Encounter: Payer: Self-pay | Admitting: Cardiology

## 2016-05-09 ENCOUNTER — Other Ambulatory Visit: Payer: Self-pay | Admitting: Internal Medicine

## 2016-06-02 ENCOUNTER — Telehealth: Payer: Self-pay | Admitting: Family Medicine

## 2016-06-03 NOTE — Telephone Encounter (Signed)
Rx request for Allopurinol 300 LOV:  03/08/2015 No new OV Last refilled 08/25/2015 Please advise

## 2016-06-03 NOTE — Telephone Encounter (Signed)
LMOM for patient to call back to schedule an appointment to see Dr. Katrinka Blazing per his request.

## 2016-06-06 NOTE — Telephone Encounter (Signed)
Appointment has been made for May 15 for the patient.

## 2016-06-06 NOTE — Telephone Encounter (Signed)
Thank you :)

## 2016-06-13 ENCOUNTER — Other Ambulatory Visit: Payer: Self-pay | Admitting: Cardiovascular Disease

## 2016-06-18 ENCOUNTER — Encounter: Payer: Self-pay | Admitting: Family Medicine

## 2016-06-19 ENCOUNTER — Ambulatory Visit (INDEPENDENT_AMBULATORY_CARE_PROVIDER_SITE_OTHER): Payer: BLUE CROSS/BLUE SHIELD | Admitting: Family Medicine

## 2016-06-19 ENCOUNTER — Encounter: Payer: Self-pay | Admitting: Family Medicine

## 2016-06-19 DIAGNOSIS — S76212A Strain of adductor muscle, fascia and tendon of left thigh, initial encounter: Secondary | ICD-10-CM

## 2016-06-19 MED ORDER — TRAMADOL HCL 50 MG PO TABS: 50.0000 mg | ORAL_TABLET | Freq: Three times a day (TID) | ORAL | 0 refills | Status: DC | PRN

## 2016-06-19 NOTE — Progress Notes (Signed)
Pre-visit discussion using our clinic review tool. No additional management support is needed unless otherwise documented below in the visit note.  

## 2016-06-19 NOTE — Patient Instructions (Signed)
Good to see you  Jason Hogan is your friend. Ice 20 minutes 2 times daily. Usually after activity and before bed. Exercises 3 times a week.  Thigh compression or compression shorts with activity could help a lot.  No running, jumping or lateral movement.  Tramadol with a tylenol up to 2 times a day for severe pain  OK to lift but only one plane and feet need to be planted.  See me again in 3-4 weeks

## 2016-06-19 NOTE — Progress Notes (Signed)
[name] [address] [place], [place] [address] Phone: [phone] Subjective:    I'm seeing this patient by the request  of:  Oliver Barre, MD  CC: Left hip pain  MHD:[id number]  Jason Hogan is a 41 y.o. male coming in with complaint of left hip pain. Patient was playing soccer. Planted his foot and unfortunately felt an audible popping sensation. Since then has had difficulty walking. This is 3 days ago. An states that when he sits he seems to do well but if he moves in certain directions he can be painful. Patient states it is near the groin area. Sometimes can radiate posteriorly. Patient denies any back pain. Denies any weakness of the leg. Just does not feel right and does not seem to be getting better over the course last 48 hours. Inflammatory secondary to patient's cardiac condition and has taken Tylenol with no improvement.     Past Medical History:  Diagnosis Date  . Allergic rhinitis, cause unspecified 10/03/2011  . Anxiety 10/03/2011  . Asthma   . ASTHMA 09/20/2006   Qualifier: Diagnosis of  By: Jonny Ruiz MD, Len Blalock   . 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 Bacterial pneumonia   . CAD (coronary artery disease)   . CARDIOMYOPATHY, ISCHEMIC 10/20/2009   Qualifier: Diagnosis of  By: Graciela Husbands, MD, Ruthann Cancer Ty Hilts   . Cholelithiasis   . CHOLELITHIASIS 06/22/2007   Qualifier: Diagnosis of  By: Maris Berger   . Chronic systolic heart failure (HCC) 09/18/2009   Qualifier: Diagnosis of  By: Kem Parkinson    . Depression   . DEPRESSION 06/22/2007   Qualifier: Diagnosis of  By: Maris Berger   . Elevated blood pressure   . Heart failure   . Hypercholesteremia   . HYPERCHOLESTEROLEMIA 09/18/2009   Qualifier: Diagnosis of  By: Kem Parkinson    . Hyperlipidemia   . Impaired glucose tolerance 10/05/2012  . Insomnia   . INSOMNIA-SLEEP  DISORDER-UNSPEC 06/22/2007   Qualifier: Diagnosis of  By: Jonny Ruiz MD, Len Blalock   . 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 Migraine headache   . MIGRAINE HEADACHE 09/18/2009   Qualifier: Diagnosis of  By: Kem Parkinson    . MYOCARDIAL INFARCTION 09/18/2009   Qualifier: Diagnosis of  By: Kem Parkinson    . Myocardial infarction (HCC)   . Neck pain    right  . Renal calculus   . RENAL CALCULUS, HX OF 09/20/2006   Qualifier: Diagnosis of  By: Jonny Ruiz MD, Len Blalock   . Renal insufficiency   . RENAL INSUFFICIENCY 09/18/2009   Qualifier: Diagnosis of  By: Kem Parkinson    . Respiratory failure (HCC)   . Respiratory failure (HCC)   . Rhabdomyolysis   . Rhabdomyolysis 09/18/2009   Qualifier: Diagnosis of  By: Kem Parkinson    . URI (upper respiratory infection)    Past Surgical History:  Procedure Laterality Date  . CARDIAC CATHETERIZATION N/A 05/17/2015   Procedure: Left Heart Cath and Coronary Angiography;  Surgeon: Wendall Stade, MD;  Location: [facility] INVASIVE CV LAB;  Service: Cardiovascular;  Laterality: N/A;   Social History   Social History  . Marital status: Married    Spouse name: N/A  . Number of children: 0  . Years of education: N/A   Occupational History  . [employer]   Social History Main Topics  . Smoking  status: Former Smoker    Packs/day: 0.10    Years: 2.00    Quit date: 02/18/1994  . Smokeless tobacco: Never Used  . Alcohol use Yes  . Drug use: No  . Sexual activity: Not Asked   Other Topics Concern  . None   Social History Narrative  . None   Allergies  Allergen Reactions  . Ace Inhibitors Cough  . Crestor [Rosuvastatin]     Sweet craving  . Lunesta [Eszopiclone]     Bitter taste  . Zocor [Simvastatin]     Memory problem   Family History  Problem Relation Age of Onset  . Heart attack Father   . Heart disease    . Diabetes    . Hypothyroidism      Past medical history, social, surgical and family history all reviewed in electronic medical  record.  No pertanent information unless stated regarding to the chief complaint.   Review of Systems:Review of systems updated and as accurate as of 06/19/16  No headache, visual changes, nausea, vomiting, diarrhea, constipation, dizziness, abdominal pain, skin rash, fevers, chills, night sweats, weight loss, swollen lymph nodes, body aches, joint swelling, muscle aches, chest pain, shortness of breath, mood changes.   Objective  There were no vitals taken for this visit. Systems examined below as of 06/19/16   General: No apparent distress alert and oriented x3 mood and affect normal, dressed appropriately.  HEENT: Pupils equal, extraocular movements intact  Respiratory: Patient's speak in full sentences and does not appear short of breath  Cardiovascular: No lower extremity edema, non tender, no erythema  Skin: Warm dry intact with no signs of infection or rash on extremities or on axial skeleton.  Abdomen: Soft nontender  Neuro: Cranial nerves II through XII are intact, neurovascularly intact in all extremities with 2+ DTRs and 2+ pulses.  Lymph: No lymphadenopathy of posterior or anterior cervical chain or axillae bilaterally.  Gait normal with good balance and coordination.  MSK:  Non tender with full range of motion and good stability and symmetric strength and tone of shoulders, elbows, wrist, , knee and ankles bilaterally.  Hip: Left ROM IR: 25 Deg, ER: 45 Deg, Flexion: 100 Deg, Extension: 80 Deg, Abduction: 45 Deg, Adduction: 45 Deg Strength IR: 4/5, ER: 5/5, Flexion: 5/5, Extension: 5/5, Abduction: 5/5, Adduction: 3/5 Pelvic alignment unremarkable to inspection and palpation. Standing hip rotation and gait without trendelenburg sign / unsteadiness. . Pain with resisted adduction. Patient also had pain with Pearlean Brownie and internal rotation. No SI joint tenderness and normal minimal SI movement.  Procedure note 97110; 15 minutes spent for Therapeutic exercises as stated in above  notes.  This included exercises focusing on stretching, strengthening, with significant focus on eccentric aspects. Hip strengthening exercises which included:  Pelvic tilt/bracing to help with proper recruitment of the lower abs and pelvic floor muscles  Glute strengthening to properly contract glutes without over-engaging low back and hamstrings - prone hip extension and glute bridge exercises Proper stretching techniques to increase effectiveness for the hip flexors, groin, quads, piriformic and low back when appropriate     Proper technique shown and discussed handout in great detail with ATC.  All questions were discussed and answered.     Impression and Recommendations:     This case required medical decision making of moderate complexity.      Note: This dictation was prepared with Dragon dictation along with smaller phrase technology. Any transcriptional errors that result from this process are unintentional.

## 2016-06-19 NOTE — Assessment & Plan Note (Signed)
Patient sent have a left groin strain today. Discussed with patient at great length. We discussed icing regimen, compression sleeve, patient work with Event organiser to learn home exercises in greater detail. Patient given theraband.  Patient given tramadol for any breakthrough pain. Differential includes a possible labral pathology of the hip but not likely based on exam today. We will monitor closely with patient coming back again in 2-4 weeks.

## 2016-07-02 ENCOUNTER — Ambulatory Visit: Payer: BLUE CROSS/BLUE SHIELD | Admitting: Family Medicine

## 2016-07-03 ENCOUNTER — Other Ambulatory Visit: Payer: Self-pay | Admitting: Cardiovascular Disease

## 2016-07-04 ENCOUNTER — Ambulatory Visit (INDEPENDENT_AMBULATORY_CARE_PROVIDER_SITE_OTHER): Payer: BLUE CROSS/BLUE SHIELD | Admitting: Internal Medicine

## 2016-07-04 ENCOUNTER — Other Ambulatory Visit (INDEPENDENT_AMBULATORY_CARE_PROVIDER_SITE_OTHER): Payer: BLUE CROSS/BLUE SHIELD

## 2016-07-04 ENCOUNTER — Other Ambulatory Visit: Payer: Self-pay | Admitting: Internal Medicine

## 2016-07-04 ENCOUNTER — Ambulatory Visit (INDEPENDENT_AMBULATORY_CARE_PROVIDER_SITE_OTHER)
Admission: RE | Admit: 2016-07-04 | Discharge: 2016-07-04 | Disposition: A | Discharge status: Home / Self Care | Payer: BLUE CROSS/BLUE SHIELD | Source: Ambulatory Visit | Attending: Internal Medicine | Admitting: Internal Medicine

## 2016-07-04 ENCOUNTER — Telehealth: Payer: Self-pay

## 2016-07-04 ENCOUNTER — Encounter: Payer: Self-pay | Admitting: Internal Medicine

## 2016-07-04 VITALS — BP 104/70 | HR 97 | Temp 98.7°F | Ht 74.0 in | Wt 225.0 lb

## 2016-07-04 DIAGNOSIS — R0600 Dyspnea, unspecified: Secondary | ICD-10-CM | POA: Diagnosis not present

## 2016-07-04 DIAGNOSIS — R509 Fever, unspecified: Secondary | ICD-10-CM

## 2016-07-04 DIAGNOSIS — M545 Low back pain, unspecified: Secondary | ICD-10-CM | POA: Insufficient documentation

## 2016-07-04 DIAGNOSIS — R7302 Impaired glucose tolerance (oral): Secondary | ICD-10-CM | POA: Diagnosis not present

## 2016-07-04 DIAGNOSIS — R1011 Right upper quadrant pain: Secondary | ICD-10-CM

## 2016-07-04 DIAGNOSIS — R8290 Unspecified abnormal findings in urine: Secondary | ICD-10-CM | POA: Insufficient documentation

## 2016-07-04 DIAGNOSIS — R3129 Other microscopic hematuria: Secondary | ICD-10-CM

## 2016-07-04 DIAGNOSIS — R0781 Pleurodynia: Secondary | ICD-10-CM | POA: Insufficient documentation

## 2016-07-04 DIAGNOSIS — R252 Cramp and spasm: Secondary | ICD-10-CM | POA: Insufficient documentation

## 2016-07-04 DIAGNOSIS — R079 Chest pain, unspecified: Secondary | ICD-10-CM

## 2016-07-04 DIAGNOSIS — S76212A Strain of adductor muscle, fascia and tendon of left thigh, initial encounter: Secondary | ICD-10-CM | POA: Diagnosis not present

## 2016-07-04 LAB — URINALYSIS, ROUTINE W REFLEX MICROSCOPIC
BILIRUBIN URINE: NEGATIVE
Ketones, ur: NEGATIVE
LEUKOCYTES UA: NEGATIVE
NITRITE: NEGATIVE
Specific Gravity, Urine: 1.02 (ref 1.000–1.030)
TOTAL PROTEIN, URINE-UPE24: 30 — AB
Urine Glucose: NEGATIVE
Urobilinogen, UA: 0.2 (ref 0.0–1.0)
pH: 6.5 (ref 5.0–8.0)

## 2016-07-04 LAB — CBC WITH DIFFERENTIAL/PLATELET
BASOS ABS: 0.1 10*3/uL (ref 0.0–0.1)
Basophils Relative: 0.5 % (ref 0.0–3.0)
EOS PCT: 0.4 % (ref 0.0–5.0)
Eosinophils Absolute: 0.1 10*3/uL (ref 0.0–0.7)
HEMATOCRIT: 41.6 % (ref 39.0–52.0)
HEMOGLOBIN: 13.6 g/dL (ref 13.0–17.0)
LYMPHS ABS: 1.2 10*3/uL (ref 0.7–4.0)
LYMPHS PCT: 8.1 % — AB (ref 12.0–46.0)
MCHC: 32.8 g/dL (ref 30.0–36.0)
MCV: 77.5 fl — AB (ref 78.0–100.0)
MONOS PCT: 7.7 % (ref 3.0–12.0)
Monocytes Absolute: 1.1 10*3/uL — ABNORMAL HIGH (ref 0.1–1.0)
NEUTROS PCT: 83.3 % — AB (ref 43.0–77.0)
Neutro Abs: 12 10*3/uL — ABNORMAL HIGH (ref 1.4–7.7)
Platelets: 305 10*3/uL (ref 150.0–400.0)
RBC: 5.37 Mil/uL (ref 4.22–5.81)
RDW: 15.5 % (ref 11.5–15.5)
WBC: 14.4 10*3/uL — AB (ref 4.0–10.5)

## 2016-07-04 LAB — HEPATIC FUNCTION PANEL
ALBUMIN: 4.7 g/dL (ref 3.5–5.2)
ALK PHOS: 40 U/L (ref 39–117)
ALT: 25 U/L (ref 0–53)
AST: 33 U/L (ref 0–37)
Bilirubin, Direct: 0.1 mg/dL (ref 0.0–0.3)
TOTAL PROTEIN: 8.5 g/dL — AB (ref 6.0–8.3)
Total Bilirubin: 0.6 mg/dL (ref 0.2–1.2)

## 2016-07-04 LAB — BASIC METABOLIC PANEL
BUN: 20 mg/dL (ref 6–23)
CALCIUM: 10 mg/dL (ref 8.4–10.5)
CO2: 31 meq/L (ref 19–32)
CREATININE: 1.2 mg/dL (ref 0.40–1.50)
Chloride: 95 mEq/L — ABNORMAL LOW (ref 96–112)
GFR: 71.13 mL/min (ref >60.00)
GLUCOSE: 82 mg/dL (ref 70–99)
POTASSIUM: 4.4 meq/L (ref 3.5–5.1)
SODIUM: 132 meq/L — AB (ref 135–145)

## 2016-07-04 LAB — HEMOGLOBIN A1C: Hgb A1c MFr Bld: 5 % (ref 4.6–6.5)

## 2016-07-04 LAB — MAGNESIUM: MAGNESIUM: 1.9 mg/dL (ref 1.5–2.5)

## 2016-07-04 LAB — D-DIMER, QUANTITATIVE: D-Dimer, Quant: 0.44 mcg/mL FEU (ref <0.50)

## 2016-07-04 LAB — BRAIN NATRIURETIC PEPTIDE: PRO B NATRI PEPTIDE: 72 pg/mL (ref 0.0–100.0)

## 2016-07-04 LAB — CK: Total CK: 632 U/L — ABNORMAL HIGH (ref 7–232)

## 2016-07-04 LAB — LIPASE: LIPASE: 26 U/L (ref 11.0–59.0)

## 2016-07-04 MED ORDER — LEVOFLOXACIN 500 MG PO TABS: 500.0000 mg | ORAL_TABLET | Freq: Every day | ORAL | 0 refills | Status: AC

## 2016-07-04 NOTE — Assessment & Plan Note (Signed)
Exam with decresaed BS I supsect related to splinting due to CP, for d-dimer with labs however, as pulm embolus and infarct can present the same way; consider CTA chest for elevated d dimer

## 2016-07-04 NOTE — Assessment & Plan Note (Signed)
Suspect simple msk strain related to recent lifting wts, but cant r/o UTI - for urine studies as ordered

## 2016-07-04 NOTE — Progress Notes (Signed)
Subjective:    Patient ID: Jason Hogan, male    DOB: 04/09/1975, 41 y.o.   MRN: [id number]  HPI  Here with acute onset numerous concurrent symptoms primarily of feverish chills, right lateral lower chest pain/abd/side pain, also c/o different anterior CP, dyspnea worsening this am with going up stairs, muscle cramping for over 1 wk, ? Dark colored urine (dark yellow) and even mild midline LBP, all seemed worse in the last 3-4 days, all in the setting of severe left groin strain with lifting at the gym about 2 wks ago  Worst pain is with right sided pain since PM may 15, which he though might be muscular b/c had been to the gym for lifting wts earlier in the day, but became worse since then, mod to severe, constant, stabbing like sharp pains on top of more constant dull pain, much worse to lie down, cant sleep well, cant get comfortable last night., Has some midline lower back pain as well, as well as hip joints he attributes to recent lifting wts at the gym, eyes hurt, HA, and then today with Cold sweats and chilling today, not sure about fever  Wondering about appendix even.  No sick contacts, except. Had mild ST with nasal congestion with bloody congestion after spent time with 41yo.  Also had anterior chest pressure , dull, intermittent this am, and is tender to touch after lifting at the gym.  Urine seems dark yellow to him, drank plenty of fluids yesterday, but has been dark for about 2 wks.  No gross heumaturia.  + hx of renal stones, last one years ago, passed without difficulty, none since then.  Only other complaint is of plenty of muscle cramps recently, asks for check on lytes as well.  Tried zanaflex last night but not helpful.  Has some pain med per Dr Katrinka Blazing for left groin strain, tried tramadol for this but not working. Had some labored breathing this am, some better with inhaler.  No LE edema, no hx of DVT or PE.  Last CTA chest 2011 - no PE.  Does have hx of rhabdo. Past Medical History:    Diagnosis Date  . Allergic rhinitis, cause unspecified 10/03/2011  . Anxiety 10/03/2011  . Asthma   . ASTHMA 09/20/2006   Qualifier: Diagnosis of  By: Jonny Ruiz MD, Len Blalock   . 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 Bacterial pneumonia   . CAD (coronary artery disease)   . CARDIOMYOPATHY, ISCHEMIC 10/20/2009   Qualifier: Diagnosis of  By: Graciela Husbands, MD, Ruthann Cancer Ty Hilts   . Cholelithiasis   . CHOLELITHIASIS 06/22/2007   Qualifier: Diagnosis of  By: Maris Berger   . Chronic systolic heart failure (HCC) 09/18/2009   Qualifier: Diagnosis of  By: Kem Parkinson    . Depression   . DEPRESSION 06/22/2007   Qualifier: Diagnosis of  By: Maris Berger   . Elevated blood pressure   . Heart failure   . Hypercholesteremia   . HYPERCHOLESTEROLEMIA 09/18/2009   Qualifier: Diagnosis of  By: Kem Parkinson    . Hyperlipidemia   . Impaired glucose tolerance 10/05/2012  . Insomnia   . INSOMNIA-SLEEP DISORDER-UNSPEC 06/22/2007   Qualifier: Diagnosis of  By: Jonny Ruiz MD, Len Blalock   . 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 Migraine headache   . MIGRAINE HEADACHE 09/18/2009   Qualifier: Diagnosis of  By: Kem Parkinson    .  MYOCARDIAL INFARCTION 09/18/2009   Qualifier: Diagnosis of  By: Kem Parkinson    . Myocardial infarction (HCC)   . Neck pain    right  . Renal calculus   . RENAL CALCULUS, HX OF 09/20/2006   Qualifier: Diagnosis of  By: Jonny Ruiz MD, Len Blalock   . Renal insufficiency   . RENAL INSUFFICIENCY 09/18/2009   Qualifier: Diagnosis of  By: Kem Parkinson    . Respiratory failure (HCC)   . Respiratory failure (HCC)   . Rhabdomyolysis   . Rhabdomyolysis 09/18/2009   Qualifier: Diagnosis of  By: Kem Parkinson    . URI (upper respiratory infection)    Past Surgical History:  Procedure Laterality Date  . CARDIAC CATHETERIZATION N/A 05/17/2015   Procedure: Left Heart Cath and Coronary Angiography;   Surgeon: Wendall Stade, MD;  Location: [facility] INVASIVE CV LAB;  Service: Cardiovascular;  Laterality: N/A;    reports that he quit smoking about 22 years ago. He has a 0.20 pack-year smoking history. He has never used smokeless tobacco. He reports that he drinks alcohol. He reports that he does not use drugs. family history includes Heart attack in his father. Allergies  Allergen Reactions  . Ace Inhibitors Cough  . Crestor [Rosuvastatin]     Sweet craving  . Lunesta [Eszopiclone]     Bitter taste  . Zocor [Simvastatin]     Memory problem   Current Outpatient Prescriptions on File Prior to Visit  Medication Sig Dispense Refill  . allopurinol (ZYLOPRIM) 300 MG tablet TAKE 1 TABLET BY MOUTH EVERY DAY 90 tablet 0  . aspirin 81 MG tablet Take 81 mg by mouth daily.     [name] atorvastatin (LIPITOR) 20 MG tablet TAKE 1 TABLET BY MOUTH EVERY DAY 30 tablet 0  . budesonide-formoterol (SYMBICORT) 80-4.5 MCG/ACT inhaler Inhale 2 puffs into the lungs 2 (two) times daily as needed (wheezing). 1 Inhaler 11  . carvedilol (COREG) 6.25 MG tablet TAKE 1 TABLET BY MOUTH TWICE A DAY WITH A MEAL 60 tablet 1  . cetirizine (ZYRTEC) 10 MG tablet Take 10 mg by mouth daily as needed for allergies.     . Coenzyme Q10 (CO Q 10) 100 MG CAPS Take 1 capsule by mouth daily.    [name] COLCRYS 0.6 MG tablet TAKE 1 TABLET BY MOUTH TWICE DAILY 60 tablet 0  . EFFIENT 10 MG TABS tablet TAKE 1 TABLET BY MOUTH EVERY DAY 30 tablet 0  . furosemide (LASIX) 20 MG tablet TAKE 1 TABLET BY MOUTH EVERY DAY 30 tablet 0  . gabapentin (NEURONTIN) 100 MG capsule TAKE 1 CAPSULE(100 MG) BY MOUTH AT BEDTIME 30 capsule 6  . metolazone (ZAROXOLYN) 5 MG tablet Take 1 tablet (5 mg total) by mouth daily as needed (for edema). 45 tablet 2  . mometasone (NASONEX) 50 MCG/ACT nasal spray Place 2 sprays into the nose daily as needed (allergies).    . mometasone-formoterol (DULERA) 200-5 MCG/ACT AERO Inhale 2 puffs into the lungs 2 (two) times daily. 3 Inhaler 3    . potassium chloride SA (K-DUR,KLOR-CON) 20 MEQ tablet Take 2 tablets (40 mEq total) by mouth daily. 60 tablet 11  . PROAIR HFA 108 (90 Base) MCG/ACT inhaler INHALE 2 PUFFS EVERY 4 HRS AS NEEDED *ONLY IF CANT CATCH BREATH* PHYSICAL DUE 8.5 Inhaler 0  . spironolactone (ALDACTONE) 25 MG tablet TAKE 1 TABLET (25 MG TOTAL) BY MOUTH DAILY. 90 tablet 2  . tiZANidine (ZANAFLEX) 4 MG tablet TAKE 1 TABLET BY MOUTH EVERY 6  HOURS AS NEEDED MUSCLE SPASMS 60 tablet 1  . traMADol (ULTRAM) 50 MG tablet Take 1 tablet (50 mg total) by mouth every 8 (eight) hours as needed. 30 tablet 0  . traZODone (DESYREL) 50 MG tablet Take 1 - 2 tablets by mouth at bedtime as needed for sleep.    . valsartan (DIOVAN) 80 MG tablet Take 1/2 tablet (40 mg) by mouth once daily 45 tablet 3   No current facility-administered medications on file prior to visit.    Review of Systems  Constitutional: Negative for other unusual diaphoresis HENT: Negative for ear discharge or swelling Eyes: Negative for other worsening visual disturbances Respiratory: Negative for stridor or other swelling  Gastrointestinal: Negative for worsening distension or other blood Genitourinary: Negative for retention or other urinary change than above Musculoskeletal: Negative for other MSK pain or swelling except left thigh still swollen after groin strain recent Skin: Negative for color change or other new lesions Neurological: Negative for worsening tremors and other numbness  Psychiatric/Behavioral: Negative for worsening agitation or other fatigue All other system neg per pt    Objective:   Physical Exam BP 104/70   Pulse 97   Temp 98.7 F (37.1 C) (Oral)   Ht 6\' 2"  (1.88 m)   Wt 225 lb (102.1 kg)   SpO2 99%   BMI 28.89 kg/m  VS noted, mild ill, has active chills and shakes to head noted Constitutional: Pt appears in NAD HENT: Head: NCAT.  Right Ear: External ear normal.  Left Ear: External ear normal.  Bilat tm's with mild erythema.   Max sinus areas non tender.  Pharynx with mild erythema, no exudate Eyes: . Pupils are equal, round, and reactive to light. Conjunctivae and EOM are normal Nose: without d/c or deformity Neck: Neck supple. Gross normal ROM with bilat tender submandib LA Cardiovascular: Normal rate and regular rhythm.  No rub or new murmur Pulmonary/Chest: Effort normal and breath sounds ? Mild decreased bilat but without specific rales or wheezing. , right CP hurts with deeper inspiration Abd:  Soft, NT, ND, + BS, no organomegaly, except right side about t10-t12 area at the anterior axillary line is localized area of marked tender without obvious bony rib abnormal Left groin/left anterior thigh with nondiscrete diffuse mild nontender swelling Neurological: Pt is alert. At baseline orientation, motor grossly intact Skin: Skin is warm. No rashes, other new lesions, no LE edema Psychiatric: Pt behavior is normal without agitation but 1+ nervous No other exam findings Lab Results  Component Value Date   WBC 7.9 10/24/2015   HGB 14.2 10/24/2015   HCT 42.5 10/24/2015   PLT 221.0 10/24/2015   GLUCOSE 96 11/23/2015   CHOL 167 10/24/2015   TRIG 237.0 (H) 10/24/2015   HDL 40.20 10/24/2015   LDLDIRECT 101.0 10/24/2015   LDLCALC 163 (H) 11/17/2014   ALT 30 10/24/2015   AST 46 (H) 10/24/2015   NA 136 11/23/2015   K 3.6 11/23/2015   CL 97 (L) 11/23/2015   CREATININE 1.10 11/23/2015   BUN 19 11/23/2015   CO2 29 11/23/2015   TSH 1.13 10/24/2015   PSA 0.26 10/24/2015   INR 1.01 05/16/2015   HGBA1C 5.4 10/14/2013   11/29/2009 CT ANGIOGRAPHY CHEST WITH CONTRAST  Summary -  IMPRESSION: 1.  No pulmonary emboli. 2.  Mild patchy interstitial prominence in both lungs.  This could represent interstitial pulmonary edema, infection, drug reaction, inflammation or chronic interstitial lung disease. 3.  Left anterior chest soft tissue edema and air  following AICD placement. 4.  Bilateral gynecomastia. 5.  Small  amount of linear atelectasis or scarring at the left lung Base.  ECG today I have personally interpreted SR with RAA, nonspecific STTWave changes    Assessment & Plan:

## 2016-07-04 NOTE — Assessment & Plan Note (Signed)
Mild, stable, for a1c with labs

## 2016-07-04 NOTE — Assessment & Plan Note (Addendum)
Etiology unclear except possibly overexertion with his workouts, for labs including mg today as ordered, encouraged more po fluid intake in light of his darker urine as well

## 2016-07-04 NOTE — Assessment & Plan Note (Signed)
Other anterior chest wall tender I suspect related to recent lifiting wts as well as acute fever related illness today, ecg rievewed and no evidence for pericarditis, also for cxr as above

## 2016-07-04 NOTE — Patient Instructions (Addendum)
Your EKG was OK today  Please take all new medication as prescribed -- the antibiotic  You can also take Delsym OTC for cough, and/or Mucinex (or it's generic off brand) for congestion, and tylenol as needed for pain.  Please continue all other medications as before, and refills have been done if requested.  Please have the pharmacy call with any other refills you may need.  Please keep your appointments with your specialists as you may have planned  Please go to the XRAY Department in the Basement (go straight as you get off the elevator) for the x-ray testing  Please go to the LAB in the Basement (turn left off the elevator) for the tests to be done today  You will be contacted by phone if any changes need to be made immediately.  Otherwise, you will receive a letter about your results with an explanation, but please check with MyChart first.  Depending on results, you may need CT scan of the chest and/or abdomen as well

## 2016-07-04 NOTE — Telephone Encounter (Signed)
[facility] called with stat labs of D-Dimer at 0.44. Please advise

## 2016-07-04 NOTE — Assessment & Plan Note (Addendum)
Exam is c/w acute URI but cant r/o even pna with severe right lower side/cp - for levaquin asd,  to f/u any worsening symptoms or concerns, for cxr, urine studies  Note:  Total time for pt hx, exam, review of record with pt in the room, determination of diagnoses and plan for further eval and tx is > 40 min, with over 50% spent in coordination and counseling of patient, including the differential diagnosis, evaluation and management of fevered illness, CP, dyspnea, LBP, abnormal urine and muscle cramping

## 2016-07-04 NOTE — Assessment & Plan Note (Addendum)
?   msk related to recent wt lifting vs other; with feverish and chills even hepatobiliary issues, diverticulitis, or even pancreatitis but doubt these, will need labs as documented, and right rib films

## 2016-07-04 NOTE — Assessment & Plan Note (Signed)
stil with some evidence for nondiscrete overall swelling of the left thigh nontender that he does not think is an issue

## 2016-07-04 NOTE — Assessment & Plan Note (Signed)
?   Significant, cant r/o rhabdo or hepatic related, for labs including Ck total, and urine studies; consider abd CT to r/o recurrent renal stone if UA with hematuria

## 2016-07-05 ENCOUNTER — Ambulatory Visit (INDEPENDENT_AMBULATORY_CARE_PROVIDER_SITE_OTHER)
Admission: RE | Admit: 2016-07-05 | Discharge: 2016-07-05 | Disposition: A | Discharge status: Home / Self Care | Payer: BLUE CROSS/BLUE SHIELD | Source: Ambulatory Visit | Attending: Internal Medicine | Admitting: Internal Medicine

## 2016-07-05 DIAGNOSIS — R3129 Other microscopic hematuria: Secondary | ICD-10-CM | POA: Diagnosis not present

## 2016-07-05 DIAGNOSIS — R1011 Right upper quadrant pain: Secondary | ICD-10-CM

## 2016-07-05 DIAGNOSIS — R509 Fever, unspecified: Secondary | ICD-10-CM | POA: Diagnosis not present

## 2016-07-05 LAB — URINE CULTURE: ORGANISM ID, BACTERIA: NO GROWTH

## 2016-07-05 MED ORDER — IOPAMIDOL (ISOVUE-300) INJECTION 61%
100.0000 mL | Freq: Once | INTRAVENOUS | Status: AC | PRN
  Administered 2016-07-05: 100 mL via INTRAVENOUS

## 2016-07-14 ENCOUNTER — Encounter: Payer: Self-pay | Admitting: Family Medicine

## 2016-07-15 ENCOUNTER — Other Ambulatory Visit: Payer: Self-pay | Admitting: Cardiovascular Disease

## 2016-07-16 NOTE — Telephone Encounter (Signed)
called to verify with Jason Hogan how he is taking his Metolazone, he is taking it PRN as directed in Sig.

## 2016-07-17 ENCOUNTER — Other Ambulatory Visit (INDEPENDENT_AMBULATORY_CARE_PROVIDER_SITE_OTHER): Payer: BLUE CROSS/BLUE SHIELD

## 2016-07-17 ENCOUNTER — Ambulatory Visit (INDEPENDENT_AMBULATORY_CARE_PROVIDER_SITE_OTHER): Payer: BLUE CROSS/BLUE SHIELD | Admitting: Family Medicine

## 2016-07-17 ENCOUNTER — Encounter: Payer: Self-pay | Admitting: Family Medicine

## 2016-07-17 VITALS — BP 120/80 | HR 74 | Ht 74.0 in | Wt 218.0 lb

## 2016-07-17 DIAGNOSIS — M2550 Pain in unspecified joint: Secondary | ICD-10-CM | POA: Diagnosis not present

## 2016-07-17 DIAGNOSIS — R319 Hematuria, unspecified: Secondary | ICD-10-CM | POA: Insufficient documentation

## 2016-07-17 DIAGNOSIS — R310 Gross hematuria: Secondary | ICD-10-CM | POA: Diagnosis not present

## 2016-07-17 LAB — C-REACTIVE PROTEIN: CRP: 1 mg/dL (ref 0.5–20.0)

## 2016-07-17 LAB — COMPREHENSIVE METABOLIC PANEL
ALT: 28 U/L (ref 0–53)
AST: 42 U/L — ABNORMAL HIGH (ref 0–37)
Albumin: 4.2 g/dL (ref 3.5–5.2)
Alkaline Phosphatase: 35 U/L — ABNORMAL LOW (ref 39–117)
BUN: 14 mg/dL (ref 6–23)
CHLORIDE: 103 meq/L (ref 96–112)
CO2: 27 meq/L (ref 19–32)
Calcium: 9.9 mg/dL (ref 8.4–10.5)
Creatinine, Ser: 0.88 mg/dL (ref 0.40–1.50)
GFR: 101.73 mL/min (ref >60.00)
GLUCOSE: 72 mg/dL (ref 70–99)
POTASSIUM: 4.8 meq/L (ref 3.5–5.1)
SODIUM: 138 meq/L (ref 135–145)
Total Bilirubin: 0.4 mg/dL (ref 0.2–1.2)
Total Protein: 8.3 g/dL (ref 6.0–8.3)

## 2016-07-17 LAB — CBC WITH DIFFERENTIAL/PLATELET
Basophils Absolute: 0 10*3/uL (ref 0.0–0.1)
Basophils Relative: 0.7 % (ref 0.0–3.0)
EOS PCT: 2.6 % (ref 0.0–5.0)
Eosinophils Absolute: 0.1 10*3/uL (ref 0.0–0.7)
HCT: 41 % (ref 39.0–52.0)
Hemoglobin: 13.5 g/dL (ref 13.0–17.0)
LYMPHS ABS: 1.6 10*3/uL (ref 0.7–4.0)
Lymphocytes Relative: 39 % (ref 12.0–46.0)
MCHC: 33 g/dL (ref 30.0–36.0)
MCV: 78 fl (ref 78.0–100.0)
MONOS PCT: 6.3 % (ref 3.0–12.0)
Monocytes Absolute: 0.3 10*3/uL (ref 0.1–1.0)
NEUTROS ABS: 2.2 10*3/uL (ref 1.4–7.7)
NEUTROS PCT: 51.4 % (ref 43.0–77.0)
Platelets: 303 10*3/uL (ref 150.0–400.0)
RBC: 5.27 Mil/uL (ref 4.22–5.81)
RDW: 15.4 % (ref 11.5–15.5)
WBC: 4.2 10*3/uL (ref 4.0–10.5)

## 2016-07-17 LAB — IBC PANEL
Iron: 105 ug/dL (ref 42–165)
Saturation Ratios: 19.6 % — ABNORMAL LOW (ref 20.0–50.0)
TRANSFERRIN: 383 mg/dL — AB (ref 212.0–360.0)

## 2016-07-17 LAB — URINALYSIS, ROUTINE W REFLEX MICROSCOPIC
BILIRUBIN URINE: NEGATIVE
KETONES UR: NEGATIVE
Leukocytes, UA: NEGATIVE
NITRITE: NEGATIVE
Specific Gravity, Urine: 1.015 (ref 1.000–1.030)
URINE GLUCOSE: NEGATIVE
UROBILINOGEN UA: 0.2 (ref 0.0–1.0)
pH: 6.5 (ref 5.0–8.0)

## 2016-07-17 LAB — CALCIUM, IONIZED: Calcium, Ion: 5.2 mg/dL (ref 4.8–5.6)

## 2016-07-17 LAB — SEDIMENTATION RATE: Sed Rate: 88 mm/hr — ABNORMAL HIGH (ref 0–15)

## 2016-07-17 MED ORDER — TRAMADOL HCL 50 MG PO TABS: 50.0000 mg | ORAL_TABLET | Freq: Three times a day (TID) | ORAL | 0 refills | Status: DC | PRN

## 2016-07-17 MED ORDER — TRAMADOL HCL 50 MG PO TABS
50.0000 mg | ORAL_TABLET | Freq: Three times a day (TID) | ORAL | 0 refills | Status: DC | PRN
Start: 2016-07-17 — End: 2016-11-12

## 2016-07-17 NOTE — Assessment & Plan Note (Signed)
Patient is having worsening polyarthralgia. Patient's seems to be doing better per his symptoms. Patient though was significantly incapacitated from the pain previously. Could be secondary to more uric acid, possibly underlying autoimmune disease, we'll further evaluate.

## 2016-07-17 NOTE — Patient Instructions (Signed)
Good to see you Ice is your friend CoQ10 400mg  daily  We will get ;labs today  Everything should calm down  See me again in 2 weeks (OK to double book)

## 2016-07-17 NOTE — Progress Notes (Signed)
[name] [address] [place], [place] [address] Phone: [phone] Subjective:    I'm seeing this patient by the request  of:  Corwin Levins, MD  CC: Body pain.  AGT:[id number]  Jason Hogan is a 41 y.o. male coming in with complaint of All over body pain. Patient's Stance 2 weeks ago patient was not feeling very good. When can fevers as well as chills. Seemed to start with more of a lateral load chest pain on the right sign. Patient states and was having some difficulty with breathing. Seemed to be more like muscle cramping. Noticed though that he is studying. Started having low back pain. Just does not feel like himself. So provider who did get labs. Found to have hematuria. Patient was treated with Levaquin. Since he has been on Levaquin pain seemed to be worsening. Was unable to get out of bed for 2 days. Patient states since pain is not seen antibiotic has been feeling better but still does not feel like himself. Continues to have muscle pains that are severe. Has not been able to workout secondary to pain. Would consider himself over improving slowly.     Past Medical History:  Diagnosis Date  . Allergic rhinitis, cause unspecified 10/03/2011  . Anxiety 10/03/2011  . Asthma   . ASTHMA 09/20/2006   Qualifier: Diagnosis of  By: Jonny Ruiz MD, Len Blalock   . 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 Bacterial pneumonia   . CAD (coronary artery disease)   . CARDIOMYOPATHY, ISCHEMIC 10/20/2009   Qualifier: Diagnosis of  By: Graciela Husbands, MD, Ruthann Cancer Ty Hilts   . Cholelithiasis   . CHOLELITHIASIS 06/22/2007   Qualifier: Diagnosis of  By: Maris Berger   . Chronic systolic heart failure (HCC) 09/18/2009   Qualifier: Diagnosis of  By: Kem Parkinson    . Depression   . DEPRESSION 06/22/2007   Qualifier: Diagnosis of  By: Maris Berger   . Elevated blood pressure     . Heart failure   . Hypercholesteremia   . HYPERCHOLESTEROLEMIA 09/18/2009   Qualifier: Diagnosis of  By: Kem Parkinson    . Hyperlipidemia   . Impaired glucose tolerance 10/05/2012  . Insomnia   . INSOMNIA-SLEEP DISORDER-UNSPEC 06/22/2007   Qualifier: Diagnosis of  By: Jonny Ruiz MD, Len Blalock   . 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 Migraine headache   . MIGRAINE HEADACHE 09/18/2009   Qualifier: Diagnosis of  By: Kem Parkinson    . MYOCARDIAL INFARCTION 09/18/2009   Qualifier: Diagnosis of  By: Kem Parkinson    . Myocardial infarction (HCC)   . Neck pain    right  . Renal calculus   . RENAL CALCULUS, HX OF 09/20/2006   Qualifier: Diagnosis of  By: Jonny Ruiz MD, Len Blalock   . Renal insufficiency   . RENAL INSUFFICIENCY 09/18/2009   Qualifier: Diagnosis of  By: Kem Parkinson    . Respiratory failure (HCC)   . Respiratory failure (HCC)   . Rhabdomyolysis   . Rhabdomyolysis 09/18/2009   Qualifier: Diagnosis of  By: Kem Parkinson    . URI (upper respiratory infection)    Past Surgical History:  Procedure Laterality Date  . CARDIAC CATHETERIZATION N/A 05/17/2015   Procedure: Left Heart Cath and Coronary Angiography;  Surgeon: Wendall Stade, MD;  Location: [facility] INVASIVE CV LAB;  Service: Cardiovascular;  Laterality: N/A;   Social History  Social History  . Marital status: Married    Spouse name: N/A  . Number of children: 0  . Years of education: N/A   Occupational History  . [employer]   Social History Main Topics  . Smoking status: Former Smoker    Packs/day: 0.10    Years: 2.00    Quit date: 02/18/1994  . Smokeless tobacco: Never Used  . Alcohol use Yes  . Drug use: No  . Sexual activity: Not Asked   Other Topics Concern  . None   Social History Narrative  . None   Allergies  Allergen Reactions  . Ace Inhibitors Cough  . Crestor [Rosuvastatin]     Sweet craving  . Lunesta [Eszopiclone]     Bitter taste  . Zocor [Simvastatin]     Memory problem   Family  History  Problem Relation Age of Onset  . Heart attack Father   . Heart disease Unknown   . Diabetes Unknown   . Hypothyroidism Unknown     Past medical history, social, surgical and family history all reviewed in electronic medical record.  No pertanent information unless stated regarding to the chief complaint.   Review of Systems:Review of systems updated and as accurate as of 07/17/16  No headache, visual changes, nausea, vomiting, diarrhea, constipation, dizziness skin rash, fevers, night sweats, weight loss, swollen lymph nodes,chest pain, shortness of breath, mood changes.  Positive body aches and muscle aches positive side pain positive chills but no fever  Objective  Blood pressure 120/80, pulse 74, height 6\' 2"  (1.88 m), weight 218 lb (98.9 kg), SpO2 99 %. Systems examined below as of 07/17/16   Systems examined below as of 07/17/16 General: NAD A&O x3 mood, affect normal  HEENT: Pupils equal, extraocular movements intact no nystagmus Respiratory: not short of breath at rest or with speaking Cardiovascular: No lower extremity edema, non tender Skin: Warm dry intact with no signs of infection or rash on extremities or on axial skeleton. Abdomen: Soft nontender, no masses Neuro: Cranial nerves  intact, neurovascularly intact in all extremities with 2+ DTRs and 2+ pulses. Lymph: No lymphadenopathy appreciated today  Gait normal with good balance and coordination.  MSK: Non tender with full range of motion and good stability and symmetric strength and tone of shoulders, elbows, wrist,  knee hips and ankles bilaterally.  Severe myalgias and the larger muscles mostly in the back, thighs, and upper arms. Patient has difficulty with even flexion of the hand.     Impression and Recommendations:     This case required medical decision making of moderate complexity.      Note: This dictation was prepared with Dragon dictation along with smaller phrase technology. Any  transcriptional errors that result from this process are unintentional.

## 2016-07-17 NOTE — Assessment & Plan Note (Signed)
Patient has been hematuria previously. Patient did have a history of kidney stones as well as patient does have a history of gout. Patient's past medical history significant for renal insufficiency. Discussed with patient at great length that this could be a difficulty and we will get repeat labs. Patient seems to be getting better to a certain degree but did not have any true diagnosis avoid the infectious etiology came from. Patient is feeling better to a certain degree and believes some of the body aches and pains is secondary to more of the floor coming along. We will continue to monitor. Close follow-ups. Patient was to hold on any other type of antibiotic at this time. We discussed over-the-counter medications a can help with some of the muscle and joint pain. Patient will follow-up with me again in 2 weeks. With patient's history differential includes potential autoimmune disease and we will get some laboratory workup as well.

## 2016-07-18 ENCOUNTER — Encounter: Payer: Self-pay | Admitting: Family Medicine

## 2016-07-18 LAB — ANTI-NUCLEAR AB-TITER (ANA TITER): ANA Titer 1: 1:40 {titer} — ABNORMAL HIGH

## 2016-07-18 LAB — PTH, INTACT AND CALCIUM
Calcium: 9.4 mg/dL (ref 8.6–10.3)
PTH: 15 pg/mL (ref 14–64)

## 2016-07-18 LAB — CYCLIC CITRUL PEPTIDE ANTIBODY, IGG

## 2016-07-18 LAB — RHEUMATOID FACTOR: Rhuematoid fact SerPl-aCnc: <14 IU/mL (ref <14)

## 2016-07-18 LAB — ANA: Anti Nuclear Antibody(ANA): POSITIVE — AB

## 2016-07-22 NOTE — Progress Notes (Signed)
Patient ID: Jason Hogan, male   DOB: 09-19-1975, 41 y.o.   MRN: [id number]    Jason Hogan is seen todya for F/U of CAD with large Anterior M.I. 2011  and collateralized silent distal RCA occlusion. Initially acute MI care was with Dr Terrence Dupont who was the on call STEMI doctor  AICD implant complicated by hematoma  Has single chamber device with no counter so unable to assess palpitations    Had care at [facility] for a while and re established June 2017   Seen by Dr [name] last week after AICD d/c;s  Found to have appropriate ATP and shock for VT. Has been seeing [name] at [facility]  Also seen by ENT there 09/2014 for epistaxis with negative evaluation.  Seen by urology for Low T on clomid but caused irritability and then anastrazole  With improvement in fatigue and libido  Did not tolerate aldactone post m.i. with excessive fatiigue and also pruritis but on it now  Recently seen by Dr [name] and [name] for polyarthragia and hematuria.  Noted labs done 07/17/16 with ESR 88 and positive ANA UA with large Hb has had stones in past Cr stable .37  Malaise started beginning of May was Rx with levaquin but felt worse  Reviewed CT abdomen 07/05/16 normal no etiology for hematuria   Lab Results  Component Value Date   WBC 4.2 07/17/2016   HGB 13.5 07/17/2016   HCT 41.0 07/17/2016   PLT 303.0 07/17/2016   GLUCOSE 72 07/17/2016   CHOL 167 10/24/2015   TRIG 237.0 (H) 10/24/2015   HDL 40.20 10/24/2015   LDLDIRECT 101.0 10/24/2015   LDLCALC 163 (H) 11/17/2014   ALT 28 07/17/2016   AST 42 (H) 07/17/2016   NA 138 07/17/2016   K 4.8 07/17/2016   CL 103 07/17/2016   CREATININE 0.88 07/17/2016   BUN 14 07/17/2016   CO2 27 07/17/2016   TSH 1.13 10/24/2015   PSA 0.26 10/24/2015   INR 1.01 05/16/2015   HGBA1C 5.0 07/04/2016    Echo 05/16/15 reviewed EF 30-35% Cath 05/17/15  Stable disease   Prox RCA lesion, 25% stenosed.  Mid RCA lesion, 99% stenosed.  Dist RCA lesion, 100%  stenosed.  Mid Cx to Dist Cx lesion, 40% stenosed.  Prox LAD to Mid LAD lesion, 10% stenosed. The lesion was previously treated with a stent (unknown type).  Dist LAD lesion, 30% stenosed.  No LAD stent restenosis No progression of moderate circumflex disease Progression of mid RCA disease but with known CTO distal RCA And left to right collaterals no indication to intervene on mid vessel That would only supply small RV branch Patient not having chest pain Indication for cath was multiple AICD d/c's   Stable ischemic DCM    ROS: Denies fever, malais, weight loss, blurry vision, decreased visual acuity, cough, sputum, SOB, hemoptysis, pleuritic pain, palpitaitons, heartburn, abdominal pain, melena, lower extremity edema, claudication, or rash.  All other systems reviewed and negative  General: Affect appropriate Healthy:  appears stated age 30: normal Neck supple with no adenopathy JVP normal no bruits no thyromegaly Lungs clear with no wheezing and good diaphragmatic motion Heart:  S1/S2 no murmur, no rub, gallop or click PMI normal Abdomen: benighn, BS positve, no tenderness, no AAA no bruit.  No HSM or HJR Distal pulses intact with no bruits No edema Neuro non-focal Skin warm and dry No muscular weakness AICD under left clavicle    Current Outpatient Prescriptions  Medication Sig Dispense Refill  .  allopurinol (ZYLOPRIM) 300 MG tablet TAKE 1 TABLET BY MOUTH EVERY DAY 90 tablet 0  . aspirin 81 MG tablet Take 81 mg by mouth daily.     [name] atorvastatin (LIPITOR) 20 MG tablet TAKE 1 TABLET BY MOUTH EVERY DAY 30 tablet 0  . budesonide-formoterol (SYMBICORT) 80-4.5 MCG/ACT inhaler Inhale 2 puffs into the lungs 2 (two) times daily as needed (wheezing). 1 Inhaler 11  . carvedilol (COREG) 6.25 MG tablet TAKE 1 TABLET BY MOUTH TWICE A DAY WITH A MEAL 60 tablet 1  . cetirizine (ZYRTEC) 10 MG tablet Take 10 mg by mouth daily as needed for allergies.     . Coenzyme Q10 (CO Q  10) 100 MG CAPS Take 1 capsule by mouth daily.    [name] COLCRYS 0.6 MG tablet TAKE 1 TABLET BY MOUTH TWICE DAILY 60 tablet 0  . EFFIENT 10 MG TABS tablet TAKE 1 TABLET BY MOUTH EVERY DAY 30 tablet 0  . furosemide (LASIX) 20 MG tablet TAKE 1 TABLET BY MOUTH EVERY DAY 30 tablet 0  . gabapentin (NEURONTIN) 100 MG capsule TAKE 1 CAPSULE(100 MG) BY MOUTH AT BEDTIME 30 capsule 6  . metolazone (ZAROXOLYN) 5 MG tablet Take 1 tablet (5 mg total) by mouth daily as needed (for edema). 45 tablet 2  . mometasone (NASONEX) 50 MCG/ACT nasal spray Place 2 sprays into the nose daily as needed (allergies).    . mometasone-formoterol (DULERA) 200-5 MCG/ACT AERO Inhale 2 puffs into the lungs 2 (two) times daily. 3 Inhaler 3  . potassium chloride SA (K-DUR,KLOR-CON) 20 MEQ tablet Take 2 tablets (40 mEq total) by mouth daily. 60 tablet 11  . PROAIR HFA 108 (90 Base) MCG/ACT inhaler INHALE 2 PUFFS EVERY 4 HRS AS NEEDED *ONLY IF CANT CATCH BREATH* PHYSICAL DUE 8.5 Inhaler 0  . spironolactone (ALDACTONE) 25 MG tablet TAKE 1 TABLET (25 MG TOTAL) BY MOUTH DAILY. 90 tablet 2  . tiZANidine (ZANAFLEX) 4 MG tablet TAKE 1 TABLET BY MOUTH EVERY 6 HOURS AS NEEDED MUSCLE SPASMS 60 tablet 1  . traMADol (ULTRAM) 50 MG tablet Take 1 tablet (50 mg total) by mouth every 8 (eight) hours as needed. 30 tablet 0  . traMADol (ULTRAM) 50 MG tablet Take 1 tablet (50 mg total) by mouth every 8 (eight) hours as needed. 30 tablet 0  . traZODone (DESYREL) 50 MG tablet Take 1 - 2 tablets by mouth at bedtime as needed for sleep.    . valsartan (DIOVAN) 80 MG tablet Take 1/2 tablet (40 mg) by mouth once daily 45 tablet 3   No current facility-administered medications for this visit.     Allergies  Levaquin [levofloxacin in d5w]; Ace inhibitors; Crestor [rosuvastatin]; Lunesta [eszopiclone]; and Zocor [simvastatin]  Electrocardiogram:  05/09/15  NSR rate 74  biatrial enlargement old anterior MI   QT 390    Assessment and Plan VT/AICD: He has had  intercurrent monomorphic ventricular tachycardia treated on one occasion successfully with ATP and on the other occasion was classified as SVT is on no therapy was given. Dr [name] reprogrammed his device to exclude onset as a criteria for discrimination. We have lowered the VT detection 280 bpm and created a VT 1 therapy zone with only ATP.  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 Course of steroids Will need repeat labs and ESR in 2 weeks or so   Hematuria  ? Related to autoimmune phenomenon he has f/u with his urologist in [facility] 6/22  Jenkins Rouge

## 2016-07-24 ENCOUNTER — Telehealth: Payer: Self-pay | Admitting: Cardiology

## 2016-07-24 NOTE — Telephone Encounter (Signed)
Spoke w/ pt and requested that he send a manual transmission b/c his home monitor has not updated in at least 7 days.   

## 2016-07-29 ENCOUNTER — Ambulatory Visit (INDEPENDENT_AMBULATORY_CARE_PROVIDER_SITE_OTHER): Payer: BLUE CROSS/BLUE SHIELD | Admitting: Cardiovascular Disease

## 2016-07-29 ENCOUNTER — Encounter: Payer: Self-pay | Admitting: Cardiovascular Disease

## 2016-07-29 VITALS — BP 120/70 | HR 85 | Ht 74.0 in | Wt 226.8 lb

## 2016-07-29 DIAGNOSIS — I255 Ischemic cardiomyopathy: Secondary | ICD-10-CM | POA: Diagnosis not present

## 2016-07-29 DIAGNOSIS — R0600 Dyspnea, unspecified: Secondary | ICD-10-CM | POA: Diagnosis not present

## 2016-07-29 NOTE — Patient Instructions (Signed)
Your physician recommends that you continue on your current medications as directed. Please refer to the Current Medication list given to you today.  Your physician has requested that you have an echocardiogram. Echocardiography is a painless test that uses sound waves to create images of your heart. It provides your doctor with information about the size and shape of your heart and how well your heart's chambers and valves are working. This procedure takes approximately one hour. There are no restrictions for this procedure.  Your physician recommends that you schedule a follow-up appointment in:   6 MONTHS WITH DR  Eden Emms

## 2016-07-30 ENCOUNTER — Encounter: Payer: Self-pay | Admitting: Family Medicine

## 2016-08-01 ENCOUNTER — Other Ambulatory Visit (INDEPENDENT_AMBULATORY_CARE_PROVIDER_SITE_OTHER): Payer: BLUE CROSS/BLUE SHIELD

## 2016-08-01 ENCOUNTER — Encounter: Payer: Self-pay | Admitting: Family Medicine

## 2016-08-01 ENCOUNTER — Ambulatory Visit (INDEPENDENT_AMBULATORY_CARE_PROVIDER_SITE_OTHER): Payer: BLUE CROSS/BLUE SHIELD | Admitting: Family Medicine

## 2016-08-01 VITALS — BP 112/72 | HR 80 | Ht 74.0 in | Wt 226.0 lb

## 2016-08-01 DIAGNOSIS — M2550 Pain in unspecified joint: Secondary | ICD-10-CM | POA: Diagnosis not present

## 2016-08-01 DIAGNOSIS — R768 Other specified abnormal immunological findings in serum: Secondary | ICD-10-CM

## 2016-08-01 LAB — URINALYSIS
Bilirubin Urine: NEGATIVE
HGB URINE DIPSTICK: NEGATIVE
KETONES UR: NEGATIVE
LEUKOCYTES UA: NEGATIVE
Nitrite: NEGATIVE
SPECIFIC GRAVITY, URINE: 1.01 (ref 1.000–1.030)
Total Protein, Urine: NEGATIVE
UROBILINOGEN UA: 0.2 (ref 0.0–1.0)
Urine Glucose: NEGATIVE
pH: 6.5 (ref 5.0–8.0)

## 2016-08-01 LAB — PROTIME-INR
INR: 1.1 ratio — AB (ref 0.8–1.0)
PROTHROMBIN TIME: 12 s (ref 9.6–13.1)

## 2016-08-01 LAB — SEDIMENTATION RATE: Sed Rate: 53 mm/hr — ABNORMAL HIGH (ref 0–15)

## 2016-08-01 NOTE — Progress Notes (Signed)
[facility] [address],  [address] Phone: [phone] Subjective:    I'm seeing this patient by the request  of:  [name], MD  CC: Body pain f/u  YIR:[id number]  Jason Hogan is a 41 y.o. male coming in with complaint of All over body pain. Patient was having significant amount of pain as well as was found to have a hematuria. Patient did have a laboratory workup showing an elevated ESR as well as positive ANA with low titer. Patient states He is making some improvement. Still feels it was more secondary to the fluoroquinolone. Patient still has some aches and pains that seems to be out of the ordinary and continues to have a significant fatigue. Patient does still hike making some improvement in symptoms of worsening.     Past Medical History:  Diagnosis Date  . Allergic rhinitis, cause unspecified 10/03/2011  . Anxiety 10/03/2011  . Asthma   . ASTHMA 09/20/2006   Qualifier: Diagnosis of  By: [name], [place] SITU 01/17/2010   Qualifier: Diagnosis of  By: [name], MD, [name]   . Bacterial pneumonia   . CAD (coronary artery disease)   . CARDIOMYOPATHY, ISCHEMIC 10/20/2009   Qualifier: Diagnosis of  By: [name], MD, Leonidas Romberg Mack Guise   . Cholelithiasis   . CHOLELITHIASIS 06/22/2007   Qualifier: Diagnosis of  By: [name]   . Chronic systolic heart failure ([place]) 09/18/2009   Qualifier: Diagnosis of  By: [name]    . Depression   . DEPRESSION 06/22/2007   Qualifier: Diagnosis of  By: [name]   . Elevated blood pressure   . Heart failure   . Hypercholesteremia   . HYPERCHOLESTEROLEMIA 09/18/2009   Qualifier: Diagnosis of  By: [name]    . Hyperlipidemia   . Impaired glucose tolerance 10/05/2012  . Insomnia   . INSOMNIA-SLEEP DISORDER-UNSPEC 06/22/2007   Qualifier: Diagnosis of  By: [name], Volente testosterone  10/14/2013  . Migraine headache   . MIGRAINE HEADACHE 09/18/2009   Qualifier: Diagnosis of  By: [name]    . MYOCARDIAL INFARCTION 09/18/2009   Qualifier: Diagnosis of  By: [name]    . Myocardial infarction ([place])   . Neck pain    right  . Renal calculus   . RENAL CALCULUS, HX OF 09/20/2006   Qualifier: Diagnosis of  By: [name], [name]   . Renal insufficiency   . RENAL INSUFFICIENCY 09/18/2009   Qualifier: Diagnosis of  By: [name]    . Respiratory failure ([place])   . Respiratory failure ([place])   . Rhabdomyolysis   . Rhabdomyolysis 09/18/2009   Qualifier: Diagnosis of  By: [name]    . URI (upper respiratory infection)    Past Surgical History:  Procedure Laterality Date  . CARDIAC CATHETERIZATION N/A 05/17/2015   Procedure: Left Heart Cath and Coronary Angiography;  Surgeon: [name], MD;  Location: [facility] CV LAB;  Service: Cardiovascular;  Laterality: N/A;   Social History   Social History  . Marital status: Married    Spouse name: N/A  . Number of children: 0  . Years of education: N/A   Occupational History  . [employer]   Social History Main Topics  . Smoking status: Former Smoker    Packs/day: 0.10    Years: 2.00    Quit  date: 02/18/1994  . Smokeless tobacco: Never Used  . Alcohol use Yes  . Drug use: No  . Sexual activity: Not Asked   Other Topics Concern  . None   Social History Narrative  . None   Allergies  Allergen Reactions  . Levaquin [Levofloxacin In D5w]   . Ace Inhibitors Cough  . Crestor [Rosuvastatin]     Sweet craving  . Lunesta [Eszopiclone]     Bitter taste  . Zocor [Simvastatin]     Memory problem   Family History  Problem Relation Age of Onset  . Heart attack Father   . Heart disease Unknown   . Diabetes Unknown   . Hypothyroidism Unknown     Past medical history, social, surgical and family history all reviewed in electronic medical record.  No pertanent information unless  stated regarding to the chief complaint.   Review of Systems: No headache, visual changes, nausea, vomiting, diarrhea, constipation, dizziness, abdominal pain, skin rash, fevers, chills, night sweats, weight loss, swollen lymph nodes, chest pain, shortness of breath, mood changes.  Positive body aches, mild joint swelling, positive muscle aches as well as nosebleeds  Objective  Blood pressure 112/72, pulse 80, height _0  (1.88 m), weight 226 lb (102.5 kg). Syste  Systems examined below as of 08/01/16 General: NAD A&O x3 mood, affect normal  HEENT: Pupils equal, extraocular movements intact no nystagmus Respiratory: not short of breath at rest or with speaking Cardiovascular: No lower extremity edema, non tender Skin: Warm dry intact with no signs of infection or rash on extremities or on axial skeleton. Abdomen: Soft nontender, no masses Neuro: Cranial nerves  intact, neurovascularly intact in all extremities with 2+ DTRs and 2+ pulses. Lymph: No lymphadenopathy appreciated today  Gait normal with good balance and coordination.  MSK: tender with full range of motion and good stability and symmetric strength and tone of shoulders, elbows, wrist,  knee hips and ankles bilaterally.   Patient does have what appears to be still mild stiffness of the hand. No joint effusions noted. Patient has full range of motion of all joints but does seem to be more tender.     Impression and Recommendations:     This case required medical decision making of moderate complexity.      Note: This dictation was prepared with Dragon dictation along with smaller phrase technology. Any transcriptional errors that result from this process are unintentional.

## 2016-08-01 NOTE — Assessment & Plan Note (Signed)
Patient continues to have a positive polyarthralgia. Hematuria with an elevated ESR and a positive ANA. Patient has had a history of myocardial infarction previously. I'm concern for some type of potential vasculitis. We will recheck labs again for further evaluation and to make sure that patient's sedimentation rate is coming back to normal. We will get anca labs to rule out any type of granulomatosis. Depending on findings we'll discuss further treatment. I do believe that there is a potential vasculitis and also will be referred to rheumatology during this time. Worsening symptoms we'll need to consider prednisone again. We'll discuss with patient once labs RN.

## 2016-08-01 NOTE — Patient Instructions (Addendum)
Good to see you  We will get labs and see  If breathing gets worse give me a call.  Rheumatology will be calling you.  I will keep you updated on the labs

## 2016-08-02 LAB — C4 COMPLEMENT: C4 COMPLEMENT: 47 mg/dL (ref 15–53)

## 2016-08-02 LAB — HEP, RPR, HIV PANEL
HIV Screen 4th Generation wRfx: NONREACTIVE
Hepatitis B Surface Ag: NEGATIVE
RPR Ser Ql: NONREACTIVE

## 2016-08-02 LAB — GLOMERULAR BASEMENT MEMBRANE ANTIBODIES: GBM Ab Interp: <1

## 2016-08-02 LAB — ANCA SCREEN W REFLEX TITER: ANCA Screen: NEGATIVE

## 2016-08-02 LAB — C3 COMPLEMENT: C3 COMPLEMENT: 149 mg/dL (ref 82–185)

## 2016-08-05 ENCOUNTER — Ambulatory Visit (INDEPENDENT_AMBULATORY_CARE_PROVIDER_SITE_OTHER): Payer: BLUE CROSS/BLUE SHIELD | Admitting: *Deleted

## 2016-08-05 DIAGNOSIS — I255 Ischemic cardiomyopathy: Secondary | ICD-10-CM

## 2016-08-06 LAB — CUP PACEART REMOTE DEVICE CHECK
Battery Remaining Percentage: 42 %
Battery Voltage: 2.92 V
Brady Statistic RV Percent Paced: 1 %
HighPow Impedance: 46 Ohm
Implantable Lead Location: 753860
Implantable Pulse Generator Implant Date: 20111010
Lead Channel Setting Pacing Amplitude: 2.5 V
Lead Channel Setting Sensing Sensitivity: 0.5 mV
MDC IDC LEAD IMPLANT DT: 20111010
MDC IDC MSMT BATTERY REMAINING LONGEVITY: 47 mo
MDC IDC MSMT LEADCHNL RV IMPEDANCE VALUE: 400 Ohm
MDC IDC MSMT LEADCHNL RV PACING THRESHOLD AMPLITUDE: 1 V
MDC IDC MSMT LEADCHNL RV PACING THRESHOLD PULSEWIDTH: 0.5 ms
MDC IDC MSMT LEADCHNL RV SENSING INTR AMPL: 9.1 mV
MDC IDC SESS DTM: 20180618112244
MDC IDC SET LEADCHNL RV PACING PULSEWIDTH: 0.5 ms
Pulse Gen Serial Number: 615318

## 2016-08-06 NOTE — Progress Notes (Signed)
Remote ICD transmission.   

## 2016-08-08 ENCOUNTER — Encounter: Payer: Self-pay | Admitting: Cardiology

## 2016-08-09 ENCOUNTER — Other Ambulatory Visit (HOSPITAL_COMMUNITY): Payer: BLUE CROSS/BLUE SHIELD

## 2016-08-12 ENCOUNTER — Other Ambulatory Visit: Payer: Self-pay | Admitting: Cardiovascular Disease

## 2016-08-12 NOTE — Telephone Encounter (Signed)
Patient at last office visit was to continue Potassium 40 meq daily.

## 2016-08-13 ENCOUNTER — Encounter: Payer: Self-pay | Admitting: Family Medicine

## 2016-08-14 MED ORDER — PREDNISONE 50 MG PO TABS: 50.0000 mg | ORAL_TABLET | Freq: Every day | ORAL | 0 refills | Status: DC

## 2016-08-16 DIAGNOSIS — R5383 Other fatigue: Secondary | ICD-10-CM | POA: Insufficient documentation

## 2016-08-17 ENCOUNTER — Other Ambulatory Visit: Payer: Self-pay | Admitting: Internal Medicine

## 2016-08-17 ENCOUNTER — Other Ambulatory Visit: Payer: Self-pay | Admitting: Cardiovascular Disease

## 2016-08-26 ENCOUNTER — Other Ambulatory Visit: Payer: Self-pay | Admitting: Cardiovascular Disease

## 2016-08-27 ENCOUNTER — Ambulatory Visit (HOSPITAL_COMMUNITY): Payer: BLUE CROSS/BLUE SHIELD | Attending: Internal Medicine

## 2016-08-27 ENCOUNTER — Other Ambulatory Visit: Payer: Self-pay

## 2016-08-27 DIAGNOSIS — E785 Hyperlipidemia, unspecified: Secondary | ICD-10-CM | POA: Insufficient documentation

## 2016-08-27 DIAGNOSIS — R0600 Dyspnea, unspecified: Secondary | ICD-10-CM | POA: Diagnosis not present

## 2016-08-27 DIAGNOSIS — Z87891 Personal history of nicotine dependence: Secondary | ICD-10-CM | POA: Diagnosis not present

## 2016-08-27 DIAGNOSIS — I252 Old myocardial infarction: Secondary | ICD-10-CM | POA: Diagnosis not present

## 2016-08-27 DIAGNOSIS — I081 Rheumatic disorders of both mitral and tricuspid valves: Secondary | ICD-10-CM | POA: Diagnosis not present

## 2016-08-27 DIAGNOSIS — I255 Ischemic cardiomyopathy: Secondary | ICD-10-CM

## 2016-08-27 DIAGNOSIS — I371 Nonrheumatic pulmonary valve insufficiency: Secondary | ICD-10-CM | POA: Insufficient documentation

## 2016-08-27 DIAGNOSIS — I509 Heart failure, unspecified: Secondary | ICD-10-CM | POA: Insufficient documentation

## 2016-08-27 MED ORDER — PERFLUTREN LIPID MICROSPHERE
1.0000 mL | INTRAVENOUS | Status: AC | PRN
  Administered 2016-08-27: 2 mL via INTRAVENOUS

## 2016-08-31 ENCOUNTER — Other Ambulatory Visit: Payer: Self-pay | Admitting: Family Medicine

## 2016-09-12 ENCOUNTER — Other Ambulatory Visit: Payer: Self-pay | Admitting: Internal Medicine

## 2016-09-16 MED ORDER — TIZANIDINE HCL 4 MG PO TABS: 4.0000 mg | ORAL_TABLET | Freq: Four times a day (QID) | ORAL | 0 refills | Status: DC | PRN

## 2016-09-25 ENCOUNTER — Other Ambulatory Visit: Payer: Self-pay | Admitting: Internal Medicine

## 2016-10-04 DIAGNOSIS — E280 Estrogen excess: Secondary | ICD-10-CM | POA: Insufficient documentation

## 2016-10-08 ENCOUNTER — Other Ambulatory Visit: Payer: Self-pay | Admitting: Cardiovascular Disease

## 2016-10-24 ENCOUNTER — Other Ambulatory Visit: Payer: Self-pay | Admitting: Family Medicine

## 2016-11-04 ENCOUNTER — Telehealth: Payer: Self-pay | Admitting: Cardiology

## 2016-11-04 ENCOUNTER — Ambulatory Visit (INDEPENDENT_AMBULATORY_CARE_PROVIDER_SITE_OTHER): Payer: Self-pay | Admitting: *Deleted

## 2016-11-04 DIAGNOSIS — I255 Ischemic cardiomyopathy: Secondary | ICD-10-CM

## 2016-11-04 LAB — CUP PACEART REMOTE DEVICE CHECK
Date Time Interrogation Session: 20181011101715
HighPow Impedance: 50 Ohm
Implantable Lead Implant Date: 20111010
Implantable Lead Location: 753860
Implantable Lead Model: 7121
Lead Channel Setting Sensing Sensitivity: 0.5 mV
MDC IDC MSMT BATTERY REMAINING LONGEVITY: 44 mo
MDC IDC MSMT LEADCHNL RV IMPEDANCE VALUE: 460 Ohm
MDC IDC PG IMPLANT DT: 20111010
MDC IDC SET LEADCHNL RV PACING AMPLITUDE: 2.5 V
MDC IDC SET LEADCHNL RV PACING PULSEWIDTH: 0.5 ms
Pulse Gen Serial Number: 615318

## 2016-11-04 NOTE — Telephone Encounter (Signed)
Attempted to confirm remote transmission with pt. No answer and was unable to leave a message.   

## 2016-11-06 ENCOUNTER — Encounter: Payer: Self-pay | Admitting: Family Medicine

## 2016-11-06 ENCOUNTER — Encounter: Payer: Self-pay | Admitting: Cardiovascular Disease

## 2016-11-12 ENCOUNTER — Encounter: Payer: Self-pay | Admitting: Internal Medicine

## 2016-11-12 ENCOUNTER — Ambulatory Visit (INDEPENDENT_AMBULATORY_CARE_PROVIDER_SITE_OTHER): Payer: Self-pay | Admitting: Internal Medicine

## 2016-11-12 VITALS — BP 120/70 | HR 77 | Ht 74.0 in | Wt 234.0 lb

## 2016-11-12 DIAGNOSIS — I255 Ischemic cardiomyopathy: Secondary | ICD-10-CM

## 2016-11-12 DIAGNOSIS — I5022 Chronic systolic (congestive) heart failure: Secondary | ICD-10-CM

## 2016-11-12 DIAGNOSIS — I472 Ventricular tachycardia, unspecified: Secondary | ICD-10-CM

## 2016-11-12 DIAGNOSIS — Z95810 Presence of automatic (implantable) cardiac defibrillator: Secondary | ICD-10-CM

## 2016-11-12 LAB — CUP PACEART INCLINIC DEVICE CHECK
Battery Remaining Longevity: 45 mo
Date Time Interrogation Session: 20180925172556
HighPow Impedance: 50.4401
Implantable Lead Implant Date: 20111010
Implantable Lead Location: 753860
Implantable Pulse Generator Implant Date: 20111010
Lead Channel Pacing Threshold Amplitude: 2.75 V
Lead Channel Setting Sensing Sensitivity: 0.5 mV
MDC IDC MSMT LEADCHNL RV IMPEDANCE VALUE: 462.5 Ohm
MDC IDC MSMT LEADCHNL RV PACING THRESHOLD AMPLITUDE: 2.75 V
MDC IDC MSMT LEADCHNL RV PACING THRESHOLD PULSEWIDTH: 1 ms
MDC IDC MSMT LEADCHNL RV PACING THRESHOLD PULSEWIDTH: 1 ms
MDC IDC MSMT LEADCHNL RV SENSING INTR AMPL: 11.9 mV
MDC IDC SET LEADCHNL RV PACING AMPLITUDE: 4 V
MDC IDC SET LEADCHNL RV PACING PULSEWIDTH: 1 ms
MDC IDC STAT BRADY RV PERCENT PACED: 0 %
Pulse Gen Serial Number: 615318

## 2016-11-12 MED ORDER — SACUBITRIL-VALSARTAN 24-26 MG PO TABS: 1.0000 | ORAL_TABLET | Freq: Two times a day (BID) | ORAL | 0 refills | Status: DC

## 2016-11-12 MED ORDER — SACUBITRIL-VALSARTAN 24-26 MG PO TABS: 1.0000 | ORAL_TABLET | Freq: Two times a day (BID) | ORAL | 11 refills | Status: DC

## 2016-11-12 MED ORDER — POTASSIUM CHLORIDE CRYS ER 20 MEQ PO TBCR: 40.0000 meq | EXTENDED_RELEASE_TABLET | Freq: Every day | ORAL | 11 refills | Status: DC

## 2016-11-12 MED ORDER — FUROSEMIDE 20 MG PO TABS: 20.0000 mg | ORAL_TABLET | Freq: Every day | ORAL | 11 refills | Status: DC

## 2016-11-12 MED ORDER — SPIRONOLACTONE 25 MG PO TABS: 12.5000 mg | ORAL_TABLET | Freq: Every day | ORAL | Status: DC

## 2016-11-12 NOTE — Progress Notes (Signed)
Patient Care Team: Corwin Levins, MD as PCP - General Wendall Stade, MD (Cardiology)   HPI  Jason Hogan is a 41 y.o. male Seen in follow-up for ventricular tachycardia with a history of ICD implantation and previous appropriate therapy;  he has a history of ischemic heart disease.    He has struggled of late with dyspnea as well as episodic lightheadedness with exertion.  The former has persisted following an episode of bronchitis. He has some nocturnal dyspnea and orthopnea. He has not had peripheral edema. He has not had chest discomfort. He has sought to relieve his symptoms by increasing his diuretics; sometimes he responds only modestly when he takes his Zaroxolyn but notably he takes only a very small dose of furosemide 20 or 40.  The other issue is lightheadedness with exertion that is accompanied by changes in his heart rate monitor. Findings to distinct occurrences, the first is abrupt lowering with rates falling into the 40s with exertion. The other is abrupt changes in rates right ear-140-100 etc. These are both associated with presyncope and are brief duration. They are unassociated with chest pain. Of late they have been reproducibly provoked with exertion   Device History: ICD implanted 2011 for primary prevention  History of appropriate therapy: Yes   3/17 VT-History of AAD therapy: Yes  ATP and shocks   He is able to exercise most ams--he is back to riding motorcycles albeit using a heart rate monitor.  Records and Results Reviewed form outside office and device interogations  Past Medical History:  Diagnosis Date  . Allergic rhinitis, cause unspecified 10/03/2011  . Anxiety 10/03/2011  . Asthma   . ASTHMA 09/20/2006   Qualifier: Diagnosis of  By: Jonny Ruiz MD, Len Blalock   . 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 Bacterial pneumonia   . CAD (coronary artery disease)   .  CARDIOMYOPATHY, ISCHEMIC 10/20/2009   Qualifier: Diagnosis of  By: Graciela Husbands, MD, Ruthann Cancer Ty Hilts   . Cholelithiasis   . CHOLELITHIASIS 06/22/2007   Qualifier: Diagnosis of  By: Maris Berger   . Chronic systolic heart failure (HCC) 09/18/2009   Qualifier: Diagnosis of  By: Kem Parkinson    . Depression   . DEPRESSION 06/22/2007   Qualifier: Diagnosis of  By: Maris Berger   . Elevated blood pressure   . Heart failure   . Hypercholesteremia   . HYPERCHOLESTEROLEMIA 09/18/2009   Qualifier: Diagnosis of  By: Kem Parkinson    . Hyperlipidemia   . Impaired glucose tolerance 10/05/2012  . Insomnia   . INSOMNIA-SLEEP DISORDER-UNSPEC 06/22/2007   Qualifier: Diagnosis of  By: Jonny Ruiz MD, Len Blalock   . 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 Migraine headache   . MIGRAINE HEADACHE 09/18/2009   Qualifier: Diagnosis of  By: Kem Parkinson    . MYOCARDIAL INFARCTION 09/18/2009   Qualifier: Diagnosis of  By: Kem Parkinson    . Myocardial infarction (HCC)   . Neck pain    right  . Renal calculus   . RENAL CALCULUS, HX OF 09/20/2006   Qualifier: Diagnosis of  By: Jonny Ruiz MD, Len Blalock   . Renal insufficiency   . RENAL INSUFFICIENCY 09/18/2009   Qualifier: Diagnosis of  By: Kem Parkinson    . Respiratory failure (HCC)   . Respiratory failure (HCC)   . Rhabdomyolysis   . Rhabdomyolysis 09/18/2009   Qualifier: Diagnosis  of  By: Kem Parkinson    . URI (upper respiratory infection)     Past Surgical History:  Procedure Laterality Date  . CARDIAC CATHETERIZATION N/A 05/17/2015   Procedure: Left Heart Cath and Coronary Angiography;  Surgeon: Wendall Stade, MD;  Location: [facility] INVASIVE CV LAB;  Service: Cardiovascular;  Laterality: N/A;    Current Outpatient Prescriptions  Medication Sig Dispense Refill  . allopurinol (ZYLOPRIM) 300 MG tablet TAKE 1 TABLET BY MOUTH EVERY DAY 90 tablet 0  . aspirin 81 MG tablet Take 81 mg by mouth daily.     [name] atorvastatin (LIPITOR) 20 MG tablet Take 1  tablet (20 mg total) by mouth daily. Annual appt w/labs due in September must see MD for refills 90 tablet 0  . budesonide-formoterol (SYMBICORT) 80-4.5 MCG/ACT inhaler Inhale 2 puffs into the lungs 2 (two) times daily as needed (wheezing). 1 Inhaler 11  . carvedilol (COREG) 6.25 MG tablet TAKE 1 TABLET BY MOUTH TWICE A DAY WITH A MEAL 60 tablet 11  . cetirizine (ZYRTEC) 10 MG tablet Take 10 mg by mouth daily as needed for allergies.     . Coenzyme Q10 (CO Q 10) 100 MG CAPS Take 1 capsule by mouth daily.    [name] COLCRYS 0.6 MG tablet TAKE 1 TABLET BY MOUTH TWICE A DAY 60 tablet 0  . EFFIENT 10 MG TABS tablet TAKE 1 TABLET BY MOUTH EVERY DAY 30 tablet 0  . furosemide (LASIX) 20 MG tablet Take 1 tablet (20 mg total) by mouth daily. 30 tablet 5  . gabapentin (NEURONTIN) 100 MG capsule TAKE 1 CAPSULE(100 MG) BY MOUTH AT BEDTIME 30 capsule 6  . metolazone (ZAROXOLYN) 5 MG tablet TAKE 1 TABLET (5 MG TOTAL) BY MOUTH DAILY AS NEEDED (FOR EDEMA). 30 tablet 9  . mometasone (NASONEX) 50 MCG/ACT nasal spray Place 2 sprays into the nose daily as needed (allergies).    . mometasone-formoterol (DULERA) 200-5 MCG/ACT AERO Inhale 2 puffs into the lungs 2 (two) times daily. 3 Inhaler 3  . potassium chloride SA (K-DUR,KLOR-CON) 20 MEQ tablet Take 2 tablets (40 mEq total) by mouth daily. 60 tablet 11  . PROAIR HFA 108 (90 Base) MCG/ACT inhaler INHALE 2 PUFFS EVERY 4 HRS AS NEEDED *ONLY IF CANT CATCH BREATH* PHYSICAL DUE 8.5 Inhaler 0  . spironolactone (ALDACTONE) 25 MG tablet TAKE 1 TABLET (25 MG TOTAL) BY MOUTH DAILY. 90 tablet 3  . tiZANidine (ZANAFLEX) 4 MG tablet Take 1 tablet (4 mg total) by mouth every 6 (six) hours as needed for muscle spasms. Annual appt due in Sept must see provider for future refills 60 tablet 0  . traMADol (ULTRAM) 50 MG tablet Take 1 tablet (50 mg total) by mouth every 8 (eight) hours as needed. 30 tablet 0  . traZODone (DESYREL) 50 MG tablet Take 1 - 2 tablets by mouth at bedtime as needed  for sleep.    . valsartan (DIOVAN) 80 MG tablet Take 1/2 tablet (40 mg) by mouth once daily 45 tablet 3   No current facility-administered medications for this visit.     Allergies  Allergen Reactions  . Levaquin [Levofloxacin In D5w]   . Ace Inhibitors Cough  . Crestor [Rosuvastatin]     Sweet craving  . Lunesta [Eszopiclone]     Bitter taste  . Zocor [Simvastatin]     Memory problem      Review of Systems negative except from HPI and PMH  Physical Exam BP 120/70   Pulse 77  Ht 6\' 2"  (1.88 m)   Wt 234 lb (106.1 kg)   SpO2 98%   BMI 30.04 kg/m  Well developed and nourished in no acute distress HENT normal Neck supple with JVP-flat Carotids brisk and full without bruits Clear Regular rate and rhythm, no murmurs or gallops Abd-soft with active BS without hepatomegaly No Clubbing cyanosis edema Skin-warm and dry A & Oriented  Grossly normal sensory and motor function  ECG: Sinus Rhythm  @79  19/10/40 Anterior wall MI    Assessment and  Plan  Ventricular tachycardia  Ischemic Cardiomyopathy  Hypokalemia   Bradycardia Chief complaint Lightheadedness   The patient is having abrupt onset episodes of bradycardia tissue with exercise. The change in heart rates could represent either impaired conduction which I think is relatively less likely given his normal intervals on his 12-lead, ectopy, or artifact poor sensing    He also has had spells where he he has noted abrupt changes in heart rate. Device interrogation demonstrated a tachycardia that was responsive to ATP with EGMs consistent with sinus suggestive of AV reentrant tachycardia  We will use a 48 hour Holter monitor to try to identify the 2 aforementioned events.  His dyspnea and nocturnal dyspnea is discordant from his examination where he has no JVD or peripheral edema. This may all be left-sided. I suggested that tomorrow he takes Lasix 80 instead of Lasix 20.  Thereafter we will begin him on  Entresto. So as to avoid aggravation of his orthostatic lightheadedness we will decrease his Aldactone from 25--12-1/2 as well as decrease his carvedilol from 6.25--3.125.  We'll have him follow-up with one of the PAs in a few weeks. He will need a metabolic profile at that time.

## 2016-11-12 NOTE — Patient Instructions (Signed)
Medication Instructions: - Your physician has recommended you make the following change in your medication:  1) DECREASE Aldactone (spironolactone) 25 mg- take 1/2 tablet (12.5 mg) by mouth once daily 2) STOP Diovan (Valsartan)- you must be off of this medication for 48 hours prior to starting Entresto 3) INCREASE Lasix (furosemide) 20 mg- take 4 tablets (80 mg) x 1 dose tomorrow, then resume your normal dose. 4) START entresto 24/26 mg- take 1 tablet by mouth twice daily  Labwork: - Your physician recommends that you have lab work today: [employer]  Procedures/Testing: - Your physician has recommended that you wear a 48 hour holter monitor. Holter monitors are medical devices that record the heart's electrical activity. Doctors most often use these monitors to diagnose arrhythmias. Arrhythmias are problems with the speed or rhythm of the heartbeat. The monitor is a small, portable device. You can wear one while you do your normal daily activities. This is usually used to diagnose what is causing palpitations/syncope (passing out).  Follow-Up: - Your physician recommends that you schedule a follow-up appointment in: 3 weeks with a PA/ NP  - Remote monitoring is used to monitor your Pacemaker of ICD from home. This monitoring reduces the number of office visits required to check your device to one time per year. It allows [place] to keep an eye on the functioning of your device to ensure it is working properly. You are scheduled for a device check from home on 02/06/17. You may send your transmission at any time that day. If you have a wireless device, the transmission will be sent automatically. After your physician reviews your transmission, you will receive a postcard with your next transmission date.  - Your physician wants you to follow-up in: 1 year with Dr. Graciela Husbands. You will receive a reminder letter in the mail two months in advance. If you don't receive a letter, please call our office to schedule the  follow-up appointment.     Any Additional Special Instructions Will Be Listed Below (If Applicable).     If you need a refill on your cardiac medications before your next appointment, please call your pharmacy.

## 2016-11-13 ENCOUNTER — Encounter: Payer: Self-pay | Admitting: Cardiology

## 2016-11-13 LAB — BASIC METABOLIC PANEL
BUN/Creatinine Ratio: 20 (ref 9–20)
BUN: 21 mg/dL (ref 6–24)
CALCIUM: 10.2 mg/dL (ref 8.7–10.2)
CO2: 24 mmol/L (ref 20–29)
CREATININE: 1.05 mg/dL (ref 0.76–1.27)
Chloride: 93 mmol/L — ABNORMAL LOW (ref 96–106)
GFR calc Af Amer: 102 mL/min/{1.73_m2} (ref >59)
GFR, EST NON AFRICAN AMERICAN: 88 mL/min/{1.73_m2} (ref >59)
Glucose: 90 mg/dL (ref 65–99)
Potassium: 3.7 mmol/L (ref 3.5–5.2)
SODIUM: 138 mmol/L (ref 134–144)

## 2016-11-13 NOTE — Progress Notes (Signed)
Remote ICD transmission.   

## 2016-11-18 ENCOUNTER — Telehealth: Payer: Self-pay

## 2016-11-18 NOTE — Telephone Encounter (Signed)
Pt is aware and agreeable to normal results  

## 2016-11-21 ENCOUNTER — Other Ambulatory Visit: Payer: Self-pay | Admitting: Internal Medicine

## 2016-11-22 ENCOUNTER — Ambulatory Visit (INDEPENDENT_AMBULATORY_CARE_PROVIDER_SITE_OTHER): Payer: Self-pay

## 2016-11-22 DIAGNOSIS — I472 Ventricular tachycardia, unspecified: Secondary | ICD-10-CM

## 2016-11-25 ENCOUNTER — Other Ambulatory Visit: Payer: Self-pay | Admitting: Internal Medicine

## 2016-12-03 ENCOUNTER — Encounter: Payer: Self-pay | Admitting: Cardiology

## 2016-12-03 ENCOUNTER — Telehealth: Payer: Self-pay | Admitting: *Deleted

## 2016-12-03 ENCOUNTER — Ambulatory Visit (INDEPENDENT_AMBULATORY_CARE_PROVIDER_SITE_OTHER): Payer: Self-pay | Admitting: *Deleted

## 2016-12-03 ENCOUNTER — Ambulatory Visit (INDEPENDENT_AMBULATORY_CARE_PROVIDER_SITE_OTHER): Payer: Self-pay | Admitting: Cardiology

## 2016-12-03 VITALS — BP 119/78 | HR 68 | Ht 74.0 in | Wt 237.0 lb

## 2016-12-03 DIAGNOSIS — I5022 Chronic systolic (congestive) heart failure: Secondary | ICD-10-CM

## 2016-12-03 DIAGNOSIS — Z79899 Other long term (current) drug therapy: Secondary | ICD-10-CM

## 2016-12-03 DIAGNOSIS — I255 Ischemic cardiomyopathy: Secondary | ICD-10-CM

## 2016-12-03 MED ORDER — MEXILETINE HCL 200 MG PO CAPS: 200.0000 mg | ORAL_CAPSULE | Freq: Two times a day (BID) | ORAL | 6 refills | Status: DC

## 2016-12-03 NOTE — Telephone Encounter (Signed)
Discussed 48 hour holter monitor with Dr Graciela Husbands and he recommends patient start Mexiletine 200 mg bid and follow up with EP APP on a day when Dr Graciela Husbands is here.  I have sent this into his pharmacy.  I have called and left a message for the patient with this info and a follow up appointment scheduled for 12/07/16 at 12:20 with Gypsy Balsam, NP.

## 2016-12-03 NOTE — Progress Notes (Signed)
12/03/2016 Jason Hogan   [birth year]  [id number]  Primary Physician [name], MD Primary Cardiologist: Dr. [name] Electrophysiologist: Dr. [name]   Reason for Visit/CC: f/u for Chronic Systolic HF; Medication Monitoring   HPI:  Jason Hogan is a 41 y.o. male  With h/o CAD s/p large anterior MI in 2011, ischemic cardiomyopathy and VT s/p AICD. He is followed by Dr. [name] and Dr. [name].   He was seen by Dr. [name] recently and complained of dyspnea as well as episodic lightheadedness with exertion, also w/ change in HR/ bradycardia, detected on his Apple watch during exercies. Dr. [name] ordered a 48 hr Holter monitor to assess PVCs and adjusted his HF meds. He added Entresto 24/26 mg. He discontinue Valsartan and decreased aldactone to 12.5 mg daily with plans to get a f/u BMP today to check renal function and K.  48 hr Heart Rate Summary: The minimum heart rate was 65 BPM, occurring at 5:29:27 AM. The maximum rate was 177 BPM, occurring at 3:14:52 PM(2). The average heart rate was 90 BPM.  Ventricular ectopic activity consisted of 5857 total beats, of these there were 3965 singles, 108 ventricular couplets, 171 runs of 3 beats or longer. The fastest ventricular run had a rate of 186 BPM and occurred at 8:07:21 PM(2). The slowest ventricular run had a rate of 155 BPM and occurred at 4:14:11 PM(2). The longest run was 26 beats long and occurred at 3:26:43 PM.  Also of note, the patient has had some recent multi joint pain, affecting his bilateral shoulders, elbows and knees. He notes stiffness. No visual changes. He discontinued his Lipitor for several weeks w/o improvement. He ha markedly elevated ESR and CK levels and was referred to rheumatology. They preformed additional lab test, ANA, RF, CRP, C3 and C4, all of which were unremarkable. They did not feel that he needed additional w/u or treatment.   He was also seen recently by his PCP for cough and initially placed on an antibiotic,  however no improvement. He was then placed on prednisone, which helped his cough and also relieved his joint pain. He is off of prednisone currently.   From a cardiac standpoint, he has been doing well. He denies CP. No dyspnea, LEE, orthopnea or PND. He reports full med compliance. He has tolerated med adjustments, addition of Entresto ok w/o side effects. BP is stable.    Current Meds  Medication Sig  . allopurinol (ZYLOPRIM) 300 MG tablet TAKE 1 TABLET BY MOUTH EVERY DAY  . aspirin 81 MG tablet Take 81 mg by mouth daily.   [name] atorvastatin (LIPITOR) 20 MG tablet Take 1 tablet (20 mg total) by mouth daily. Annual appt w/labs due in September must see MD for refills  . budesonide-formoterol (SYMBICORT) 80-4.5 MCG/ACT inhaler Inhale 2 puffs into the lungs 2 (two) times daily as needed (wheezing).  . carvedilol (COREG) 6.25 MG tablet TAKE 1 TABLET BY MOUTH TWICE A DAY WITH A MEAL  . cetirizine (ZYRTEC) 10 MG tablet Take 10 mg by mouth daily as needed for allergies.   . Coenzyme Q10 (CO Q 10) 100 MG CAPS Take 1 capsule by mouth daily.  [name] COLCRYS 0.6 MG tablet TAKE 1 TABLET BY MOUTH TWICE A DAY  . EFFIENT 10 MG TABS tablet TAKE 1 TABLET BY MOUTH EVERY DAY  . furosemide (LASIX) 20 MG tablet Take 1 tablet (20 mg total) by mouth daily.  . metolazone (ZAROXOLYN) 5 MG tablet TAKE 1 TABLET (5  MG TOTAL) BY MOUTH DAILY AS NEEDED (FOR EDEMA).  . mometasone (NASONEX) 50 MCG/ACT nasal spray Place 2 sprays into the nose daily as needed (allergies).  . mometasone-formoterol (DULERA) 200-5 MCG/ACT AERO Inhale 2 puffs into the lungs 2 (two) times daily.  . potassium chloride SA (K-DUR,KLOR-CON) 20 MEQ tablet Take 2 tablets (40 mEq total) by mouth daily.  [name] PROAIR HFA 108 (90 Base) MCG/ACT inhaler INHALE 2 PUFFS EVERY 4 HRS AS NEEDED *ONLY IF CANT CATCH BREATH* PHYSICAL DUE  . sacubitril-valsartan (ENTRESTO) 24-26 MG Take 1 tablet by mouth 2 (two) times daily.  [name] spironolactone (ALDACTONE) 25 MG tablet Take 0.5  tablets (12.5 mg total) by mouth daily.  [name] tiZANidine (ZANAFLEX) 4 MG tablet TAKE 1 TABLET BY MOUTH EVERY 6 HOURS AS NEEDED SPASMS OF THE MUSCLE   Allergies  Allergen Reactions  . Levaquin [Levofloxacin In D5w] Other (See Comments)  . Ace Inhibitors Cough  . Crestor [Rosuvastatin]     Sweet craving  . Lunesta [Eszopiclone]     Bitter taste  . Zocor [Simvastatin]     Memory problem   Past Medical History:  Diagnosis Date  . Allergic rhinitis, cause unspecified 10/03/2011  . Anxiety 10/03/2011  . Asthma   . ASTHMA 09/20/2006   Qualifier: Diagnosis of  By: [name], [place] SITU 01/17/2010   Qualifier: Diagnosis of  By: [name], MD, [name]   . Bacterial pneumonia   . CAD (coronary artery disease)   . CARDIOMYOPATHY, ISCHEMIC 10/20/2009   Qualifier: Diagnosis of  By: [name], MD, Leonidas Romberg Mack Guise   . Cholelithiasis   . CHOLELITHIASIS 06/22/2007   Qualifier: Diagnosis of  By: [name]   . Chronic systolic heart failure ([place]) 09/18/2009   Qualifier: Diagnosis of  By: [name]    . Depression   . DEPRESSION 06/22/2007   Qualifier: Diagnosis of  By: [name]   . Elevated blood pressure   . Heart failure   . Hypercholesteremia   . HYPERCHOLESTEROLEMIA 09/18/2009   Qualifier: Diagnosis of  By: [name]    . Hyperlipidemia   . Impaired glucose tolerance 10/05/2012  . Insomnia   . INSOMNIA-SLEEP DISORDER-UNSPEC 06/22/2007   Qualifier: Diagnosis of  By: [name], [place] testosterone 10/14/2013  . Migraine headache   . MIGRAINE HEADACHE 09/18/2009   Qualifier: Diagnosis of  By: [name]    . MYOCARDIAL INFARCTION 09/18/2009   Qualifier: Diagnosis of  By: [name]    . Myocardial infarction ([place])   . Neck pain    right  . Renal calculus   . RENAL CALCULUS, HX OF 09/20/2006   Qualifier: Diagnosis of  By: [name], [name]   . Renal insufficiency   . RENAL  INSUFFICIENCY 09/18/2009   Qualifier: Diagnosis of  By: [name]    . Respiratory failure ([place])   . Respiratory failure ([place])   . Rhabdomyolysis   . Rhabdomyolysis 09/18/2009   Qualifier: Diagnosis of  By: [name]    . URI (upper respiratory infection)    Family History  Problem Relation Age of Onset  . Heart attack Father   . Heart disease Unknown   . Diabetes Unknown   . Hypothyroidism Unknown    Past Surgical History:  Procedure Laterality Date  . CARDIAC CATHETERIZATION N/A 05/17/2015   Procedure: Left Heart Cath and Coronary Angiography;  Surgeon: [name], MD;  Location: [facility];  Service: Cardiovascular;  Laterality: N/A;   Social History   Social History  . Marital status: Married    Spouse name: N/A  . Number of children: 0  . Years of education: N/A   Occupational History  . [employer]   Social History Main Topics  . Smoking status: Former Smoker    Packs/day: 0.10    Years: 2.00    Quit date: 02/18/1994  . Smokeless tobacco: Never Used  . Alcohol use Yes  . Drug use: No  . Sexual activity: Not on file   Other Topics Concern  . Not on file   Social History Narrative  . No narrative on file     Review of Systems: General: negative for chills, fever, night sweats or weight changes.  Cardiovascular: negative for chest pain, dyspnea on exertion, edema, orthopnea, palpitations, paroxysmal nocturnal dyspnea or shortness of breath Dermatological: negative for rash Respiratory: negative for cough or wheezing Urologic: negative for hematuria Abdominal: negative for nausea, vomiting, diarrhea, bright red blood per rectum, melena, or hematemesis Neurologic: negative for visual changes, syncope, or dizziness All other systems reviewed and are otherwise negative except as noted above.   Physical Exam:  Blood pressure 119/78, pulse 68, height 6' 2"  (1.88 m), weight 237 lb (107.5 kg).  General appearance: alert,  cooperative and no distress Neck: no carotid bruit and no JVD Lungs: clear to auscultation bilaterally Heart: regular rate and rhythm, S1, S2 normal, no murmur, click, rub or gallop Extremities: extremities normal, atraumatic, no cyanosis or edema Pulses: 2+ and symmetric Skin: Skin color, texture, turgor normal. No rashes or lesions Neurologic: Grossly normal  EKG not performed -- personally reviewed   ASSESSMENT AND PLAN:   1. Chronic Systolic HF: recent HF regimen adjusted. Entresto added by Dr. [name] and spironolactone dose reduced. He is also on Coreg and Lasix. BP is stable. He is tolerating Entresto w/o symptoms. We will obtain a f/u BMP today to check renal function of K.   2. CAD: h/o large anterior MI in 2011. Stable w/o CP. Continue medical therapy.   3. Lightheadedness: improved. Would occur during exercise with drop in HR on Apple watch. ? PVCs. 48 hr holter monitor showed Ventricular ectopic activity consisted of 5857 total beats, of these there were 3965 singles, 108 ventricular couplets, 171 runs of 3 beats or longer. The fastest ventricular run had a rate of 186 BPM and occurred at 8:07:21 PM(2). The slowest ventricular run had a rate of 155 BPM and occurred at 4:14:11 PM(2). The longest run was 26 beats long. Further recs per Dr. [name], who will also review report (physican read pending). Continue BB therapy with Coreg. I've discussed with Dr. [name], his primary cardiologist, who has also recommended we interrogate his device today.   4. Multi Joint Pain: initially with markedly elevated ESR with improvement in symptoms with prednisone (actually prescribed for cough). Pt was sent to rheumatology and w/u was negative including ANA, RF, C3 and C4.  Dr. [name] was concerned regarding possible connective tissues problem vs vasculitis/ myositis. I've discussed with him. He has recommended that we get repeat labs. Will Recheck ESR, CRP, ANA and CPK.    Follow-Up w/ Dr. [name] First  Available. F/u with Dr. [name] as directed.   Cassara Nida Ladoris Gene, MHS [facility] 12/03/2016 9:42 AM

## 2016-12-03 NOTE — Patient Instructions (Addendum)
Your physician recommends that you continue on your current medications as directed. Please refer to the Current Medication list given to you today.   Your physician recommends that you return for lab work in:  [place]. AND CPK  Your physician recommends that you schedule a follow-up appointment in:  NEXT AVAILABLE WITH DR [name]

## 2016-12-04 LAB — BASIC METABOLIC PANEL
BUN / CREAT RATIO: 17 (ref 9–20)
BUN: 15 mg/dL (ref 6–24)
CALCIUM: 10.4 mg/dL — AB (ref 8.7–10.2)
CO2: 27 mmol/L (ref 20–29)
Chloride: 91 mmol/L — ABNORMAL LOW (ref 96–106)
Creatinine, Ser: 0.89 mg/dL (ref 0.76–1.27)
GFR, EST AFRICAN AMERICAN: 124 mL/min/{1.73_m2} (ref >59)
GFR, EST NON AFRICAN AMERICAN: 107 mL/min/{1.73_m2} (ref >59)
Glucose: 85 mg/dL (ref 65–99)
POTASSIUM: 3.7 mmol/L (ref 3.5–5.2)
SODIUM: 136 mmol/L (ref 134–144)

## 2016-12-04 LAB — CK: Total CK: 512 U/L (ref 24–204)

## 2016-12-04 LAB — SEDIMENTATION RATE: SED RATE: 48 mm/h — AB (ref 0–15)

## 2016-12-04 LAB — C-REACTIVE PROTEIN: CRP: 1 mg/L (ref 0.0–4.9)

## 2016-12-04 LAB — ANA: Anti Nuclear Antibody(ANA): POSITIVE — AB

## 2016-12-05 ENCOUNTER — Other Ambulatory Visit: Payer: Self-pay | Admitting: *Deleted

## 2016-12-05 MED ORDER — MEXILETINE HCL 200 MG PO CAPS: 200.0000 mg | ORAL_CAPSULE | Freq: Two times a day (BID) | ORAL | 6 refills | Status: DC

## 2016-12-05 NOTE — Addendum Note (Signed)
Addended by: Baird Lyons on: 12/05/2016 02:14 PM   Modules accepted: Orders

## 2016-12-05 NOTE — Telephone Encounter (Signed)
Verified pt aware of starting medication (Mexilitine) and f/u w/ Gypsy Balsam, NP next week after starting medication. Pt is agreeable to plan. He asked that medication be sent to [facility].  I sent per request.

## 2016-12-06 LAB — CUP PACEART INCLINIC DEVICE CHECK
Battery Remaining Longevity: 44 mo
Brady Statistic RV Percent Paced: 0 %
HighPow Impedance: 60.4561
Implantable Lead Model: 7121
Lead Channel Pacing Threshold Amplitude: 2 V
Lead Channel Sensing Intrinsic Amplitude: 11.9 mV
Lead Channel Setting Pacing Amplitude: 4 V
Lead Channel Setting Pacing Pulse Width: 1 ms
Lead Channel Setting Sensing Sensitivity: 0.5 mV
MDC IDC LEAD IMPLANT DT: 20111010
MDC IDC LEAD LOCATION: 753860
MDC IDC MSMT LEADCHNL RV IMPEDANCE VALUE: 512.5 Ohm
MDC IDC MSMT LEADCHNL RV PACING THRESHOLD AMPLITUDE: 2 V
MDC IDC MSMT LEADCHNL RV PACING THRESHOLD PULSEWIDTH: 1 ms
MDC IDC MSMT LEADCHNL RV PACING THRESHOLD PULSEWIDTH: 1 ms
MDC IDC PG IMPLANT DT: 20111010
MDC IDC SESS DTM: 20181016135108
Pulse Gen Serial Number: 615318

## 2016-12-06 NOTE — Progress Notes (Signed)
ICD check in clinic-add on w/Dr.Nishan. Normal device function. Threshold and sensing consistent with previous device measurements. Impedance trends stable over time. No evidence of any ventricular arrhythmias. Histogram distribution appropriate for patient and level of activity. Stable thoracic impedance. No changes made this session. Device programmed at appropriate safety margins. Device programmed to optimize intrinsic conduction. Estimated longevity 2.7-3.7. Pt will follow up as scheduled.

## 2016-12-11 NOTE — Progress Notes (Signed)
Electrophysiology Office Note Date: 12/13/2016  ID:  Jason Hogan, DOB 07/25/1975, MRN [id number]  PCP: Jason Borg, Hogan Primary Cardiologist: Jason Hogan Electrophysiologist: Jason Hogan  CC: ICD follow-up  Jason Hogan is a 41 y.o. male seen today for Dr Jason Hogan.  He presents today for routine electrophysiology followup.  Since last being seen in our clinic, the patient reports improvement on Mexiletine. The episodes that he was having during exercise of sudden decreased heart rates are improved.  His biggest complaints now are related to persistent musculoskeletal pain.  He also has persistently elevated ESR.  He has been seen by rheumatology.  He denies chest pain,  dyspnea, PND, orthopnea, nausea, vomiting, dizziness, syncope, edema, weight gain, or early satiety.  He has not had ICD shocks.   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 Allergic rhinitis, cause unspecified 10/03/2011  . Anxiety 10/03/2011  . Asthma   . ASTHMA 09/20/2006   Qualifier: Diagnosis of  By: Jason Hogan, Jason Hogan SITU 01/17/2010   Qualifier: Diagnosis of  By: Jason Cancel, Hogan, Jason Hogan   . Bacterial pneumonia   . CAD (coronary artery disease)   . CARDIOMYOPATHY, ISCHEMIC 10/20/2009   Qualifier: Diagnosis of  By: Jason Comes, Hogan, Leonidas Romberg Mack Guise   . Cholelithiasis   . CHOLELITHIASIS 06/22/2007   Qualifier: Diagnosis of  By: Jason Hogan   . Chronic systolic heart failure ([place]) 09/18/2009   Qualifier: Diagnosis of  By: Jason Hogan    . Depression   . DEPRESSION 06/22/2007   Qualifier: Diagnosis of  By: Jason Hogan   . Elevated blood pressure   . Heart failure   . Hypercholesteremia   . HYPERCHOLESTEROLEMIA 09/18/2009   Qualifier: Diagnosis of  By: Jason Hogan    . Hyperlipidemia   . Impaired glucose  tolerance 10/05/2012  . Insomnia   . INSOMNIA-SLEEP DISORDER-UNSPEC 06/22/2007   Qualifier: Diagnosis of  By: Jason Hogan, [place] testosterone 10/14/2013  . Migraine headache   . MIGRAINE HEADACHE 09/18/2009   Qualifier: Diagnosis of  By: Jason Hogan    . MYOCARDIAL INFARCTION 09/18/2009   Qualifier: Diagnosis of  By: Jason Hogan    . Myocardial infarction ([place])   . Neck pain    right  . Renal calculus   . RENAL CALCULUS, HX OF 09/20/2006   Qualifier: Diagnosis of  By: Jason Hogan, Jason Hogan   . Renal insufficiency   . RENAL INSUFFICIENCY 09/18/2009   Qualifier: Diagnosis of  By: Jason Hogan    . Respiratory failure ([place])   . Respiratory failure ([place])   . Rhabdomyolysis   . Rhabdomyolysis 09/18/2009   Qualifier: Diagnosis of  By: Jason Hogan    . URI (upper respiratory infection)    Past Surgical History:  Procedure Laterality Date  . CARDIAC CATHETERIZATION N/A 05/17/2015   Procedure: Left Heart Cath and Coronary Angiography;  Surgeon: Jason Hector, Hogan;  Location: [facility];  Service: Cardiovascular;  Laterality: N/A;    Current Outpatient Prescriptions  Medication Sig Dispense Refill  . allopurinol (ZYLOPRIM) 300 MG tablet TAKE 1 TABLET BY MOUTH EVERY DAY 90 tablet 0  . aspirin 81 MG tablet Take 81 mg by mouth daily.     [name] atorvastatin (LIPITOR) 20 MG tablet Take 1 tablet (20  mg total) by mouth daily. Annual appt w/labs due in September must see Hogan for refills 90 tablet 0  . budesonide-formoterol (SYMBICORT) 80-4.5 MCG/ACT inhaler Inhale 2 puffs into the lungs 2 (two) times daily as needed (wheezing). 1 Inhaler 11  . carvedilol (COREG) 6.25 MG tablet TAKE 1 TABLET BY MOUTH TWICE A DAY WITH A MEAL 60 tablet 11  . cetirizine (ZYRTEC) 10 MG tablet Take 10 mg by mouth daily as needed for allergies.     . Coenzyme Q10 (CO Q 10) 100 MG CAPS Take 1 capsule by mouth daily.    [name] COLCRYS 0.6 MG tablet TAKE 1 TABLET BY MOUTH TWICE A DAY 60 tablet 0  . EFFIENT 10 MG  TABS tablet TAKE 1 TABLET BY MOUTH EVERY DAY 30 tablet 0  . furosemide (LASIX) 20 MG tablet Take 1 tablet (20 mg total) by mouth daily. 30 tablet 11  . metolazone (ZAROXOLYN) 5 MG tablet TAKE 1 TABLET (5 MG TOTAL) BY MOUTH DAILY AS NEEDED (FOR EDEMA). 30 tablet 9  . mexiletine (MEXITIL) 200 MG capsule Take 1 capsule (200 mg total) by mouth 2 (two) times daily. 60 capsule 6  . mometasone (NASONEX) 50 MCG/ACT nasal spray Place 2 sprays into the nose daily as needed (allergies).    . mometasone-formoterol (DULERA) 200-5 MCG/ACT AERO Inhale 2 puffs into the lungs 2 (two) times daily. 3 Inhaler 3  . potassium chloride SA (K-DUR,KLOR-CON) 20 MEQ tablet Take 2 tablets (40 mEq total) by mouth daily. 60 tablet 11  . PROAIR HFA 108 (90 Base) MCG/ACT inhaler INHALE 2 PUFFS EVERY 4 HRS AS NEEDED *ONLY IF CANT CATCH BREATH* PHYSICAL DUE 8.5 Inhaler 0  . sacubitril-valsartan (ENTRESTO) 24-26 MG Take 1 tablet by mouth 2 (two) times daily. 60 tablet 11  . spironolactone (ALDACTONE) 25 MG tablet Take 0.5 tablets (12.5 mg total) by mouth daily.    [name] tiZANidine (ZANAFLEX) 4 MG tablet TAKE 1 TABLET BY MOUTH EVERY 6 HOURS AS NEEDED SPASMS OF THE MUSCLE 60 tablet 0   No current facility-administered medications for this visit.     Allergies:   Levaquin [levofloxacin in d5w]; Ace inhibitors; Crestor [rosuvastatin]; Lunesta [eszopiclone]; and Zocor [simvastatin]   Social History: Social History   Social History  . Marital status: Married    Spouse name: N/A  . Number of children: 0  . Years of education: N/A   Occupational History  . [name]   Social History Main Topics  . Smoking status: Former Smoker    Packs/day: 0.10    Years: 2.00    Quit date: 02/18/1994  . Smokeless tobacco: Never Used  . Alcohol use Yes  . Drug use: No  . Sexual activity: Not on file   Other Topics Concern  . Not on file   Social History Narrative  . No narrative on file    Family History: Family History    Problem Relation Age of Onset  . Heart attack Father   . Heart disease Unknown   . Diabetes Unknown   . Hypothyroidism Unknown     Review of Systems: All other systems reviewed and are otherwise negative except as noted above.   Physical Exam: VS:  BP 110/88   Pulse 71   Ht 6' 2"  (1.88 m)   Wt 237 lb (107.5 kg)   BMI 30.43 kg/m  , BMI Body mass index is 30.43 kg/m.  GEN- The patient is well appearing, alert and oriented x 3 today.  HEENT: normocephalic, atraumatic; sclera clear, conjunctiva pink; hearing intact; oropharynx clear; neck supple  Lungs- Clear to ausculation bilaterally, normal work of breathing.  No wheezes, rales, rhonchi Heart- Regular rate and rhythm  GI- soft, non-tender, non-distended, bowel sounds present  Extremities- no clubbing, cyanosis, or edema  MS- no significant deformity or atrophy Skin- warm and dry, no rash or lesion; ICD pocket well healed Psych- euthymic mood, full affect Neuro- strength and sensation are intact  ICD interrogation- reviewed in detail today,  See PACEART report  EKG:  EKG is ordered today. The ekg ordered today shows sinus rhythm  Recent Labs: 07/04/2016: Magnesium 1.9; Pro B Natriuretic peptide (BNP) 72.0 07/17/2016: ALT 28; Hemoglobin 13.5; Platelets 303.0 12/03/2016: BUN 15; Creatinine, Ser 0.89; Potassium 3.7; Sodium 136   Wt Readings from Last 3 Encounters:  12/13/16 237 lb (107.5 kg)  12/03/16 237 lb (107.5 kg)  11/12/16 234 lb (106.1 kg)     Other studies Reviewed: Additional studies/ records that were reviewed today include: Dr Jason Hogan and Dr Kyla Balzarine office notes   Assessment and Plan:  1.  Chronic systolic dysfunction euvolemic today Stable on an appropriate medical regimen Normal ICD function See Pace Art report No changes today Recent BMET stable with Entresto  2.  PVC's Improved on Mexiletine, burden only about 2% per histograms today EKG stable  3.  CAD No recent ischemic symptoms Continue  current therapy    Current medicines are reviewed at length with the patient today.   The patient does not have concerns regarding his medicines.  The following changes were made today:  none  Labs/ tests ordered today include: none Orders Placed This Encounter  Procedures  . Flu Vaccine QUAD 36+ mos IM  . EKG 12-Lead     Disposition:   Follow up with Dr Jason Hogan 3 months, [place]     Signed, Chanetta Marshall, NP 12/13/2016 4:03 PM  [facility] [address] [place] [address] [phone] (office) [phone] (fax)

## 2016-12-12 ENCOUNTER — Other Ambulatory Visit: Payer: Self-pay | Admitting: Internal Medicine

## 2016-12-13 ENCOUNTER — Ambulatory Visit (INDEPENDENT_AMBULATORY_CARE_PROVIDER_SITE_OTHER): Payer: Self-pay | Admitting: Nurse Practitioner

## 2016-12-13 ENCOUNTER — Encounter: Payer: Self-pay | Admitting: Nurse Practitioner

## 2016-12-13 VITALS — BP 110/88 | HR 71 | Ht 74.0 in | Wt 237.0 lb

## 2016-12-13 DIAGNOSIS — I493 Ventricular premature depolarization: Secondary | ICD-10-CM

## 2016-12-13 DIAGNOSIS — I5022 Chronic systolic (congestive) heart failure: Secondary | ICD-10-CM

## 2016-12-13 DIAGNOSIS — I255 Ischemic cardiomyopathy: Secondary | ICD-10-CM

## 2016-12-13 DIAGNOSIS — Z23 Encounter for immunization: Secondary | ICD-10-CM

## 2016-12-13 NOTE — Patient Instructions (Addendum)
Medication Instructions:    Your physician recommends that you continue on your current medications as directed. Please refer to the Current Medication list given to you today.   If you need a refill on your cardiac medications before your next appointment, please call your pharmacy.  Labwork: NONE ORDERED  TODAY    Testing/Procedures: NONE ORDERED  TODAY    Follow-Up:  3 MONTHS WITH DR KLEIN     Any Other Special Instructions Will Be Listed Below (If Applicable).                                                                                                                                                   

## 2016-12-23 ENCOUNTER — Other Ambulatory Visit: Payer: Self-pay | Admitting: Family Medicine

## 2016-12-23 ENCOUNTER — Other Ambulatory Visit: Payer: Self-pay | Admitting: Internal Medicine

## 2016-12-23 ENCOUNTER — Telehealth: Payer: Self-pay

## 2016-12-23 DIAGNOSIS — Z79899 Other long term (current) drug therapy: Secondary | ICD-10-CM

## 2016-12-23 MED ORDER — TORSEMIDE 20 MG PO TABS: 40.0000 mg | ORAL_TABLET | Freq: Two times a day (BID) | ORAL | 6 refills | Status: DC

## 2016-12-23 MED ORDER — PREDNISONE 10 MG (21) PO TBPK: ORAL_TABLET | ORAL | 0 refills | Status: DC

## 2016-12-23 NOTE — Telephone Encounter (Signed)
Patient is aware of Dr. Fabio Bering recommendations. Patient will start medications tomorrow. Sent to patient's pharmacy of choice. Patient will come in on 01/07/17 for BMET. Patient verbalized understanding.

## 2016-12-23 NOTE — Telephone Encounter (Signed)
Refill done.  

## 2016-12-23 NOTE — Progress Notes (Signed)
Patient ID: Jason Hogan, male   DOB: 02/01/1976, 41 y.o.   MRN: [id number]    Jason Hogan is seen todya for F/U of CAD with large Anterior M.I. 2011  and collateralized silent distal RCA occlusion. Initially acute MI care was with Dr Terrence Dupont who was the on call STEMI doctor  AICD implant complicated by hematoma  Has single chamber device with no counter so unable to assess palpitations    Had care at [facility] for a while and re established June 2017   Seen by Dr [name] last week after AICD d/c;s  Found to have appropriate ATP and shock for VT. Has been seeing [name] at [facility] Less than 2% PVC burden last EP visit October 2018 with mexiletine   Also seen by ENT there 09/2014 for epistaxis with negative evaluation.  Seen by urology for Low T on clomid but caused irritability and then anastrazole  With improvement in fatigue and libido  Did not tolerate aldactone post m.i. with excessive fatiigue and also pruritis but on it now  Seen by Dr [name] and [name] for polyarthragia and hematuria.  Noted labs done 07/17/16 with ESR 88 and positive ANA UA with large Hb has had stones in past Cr stable .30  Malaise started beginning of May was Rx with levaquin but felt worse  Reviewed CT abdomen 07/05/16 normal no etiology for hematuria   He feels much better on prednisone taper demedex doing better job diuresing  Needs to be re evaluated by rheumatology for persistent elevation in ESR and  polymyalgia   Lab Results  Component Value Date   WBC 4.2 07/17/2016   HGB 13.5 07/17/2016   HCT 41.0 07/17/2016   PLT 303.0 07/17/2016   GLUCOSE 85 12/03/2016   CHOL 167 10/24/2015   TRIG 237.0 (H) 10/24/2015   HDL 40.20 10/24/2015   LDLDIRECT 101.0 10/24/2015   LDLCALC 163 (H) 11/17/2014   ALT 28 07/17/2016   AST 42 (H) 07/17/2016   NA 136 12/03/2016   K 3.7 12/03/2016   CL 91 (L) 12/03/2016   CREATININE 0.89 12/03/2016   BUN 15 12/03/2016   CO2 27 12/03/2016   TSH 1.13 10/24/2015   PSA  0.26 10/24/2015   INR 1.1 (H) 08/01/2016   HGBA1C 5.0 07/04/2016    Echo 05/16/15 reviewed EF 30-35% Cath 05/17/15  Stable disease   Prox RCA lesion, 25% stenosed.  Mid RCA lesion, 99% stenosed.  Dist RCA lesion, 100% stenosed.  Mid Cx to Dist Cx lesion, 40% stenosed.  Prox LAD to Mid LAD lesion, 10% stenosed. The lesion was previously treated with a stent (unknown type).  Dist LAD lesion, 30% stenosed.  No LAD stent restenosis No progression of moderate circumflex disease Progression of mid RCA disease but with known CTO distal RCA And left to right collaterals no indication to intervene on mid vessel That would only supply small RV branch Patient not having chest pain Indication for cath was multiple AICD d/c's   Stable ischemic DCM  ROS: Denies fever, malais, weight loss, blurry vision, decreased visual acuity, cough, sputum, SOB, hemoptysis, pleuritic pain, palpitaitons, heartburn, abdominal pain, melena, lower extremity edema, claudication, or rash.  All other systems reviewed and negative  General: Filed Weights   12/26/16 0913  Weight: 239 lb 4 oz (108.5 kg)   BP 110/72   Pulse 87   Ht 6' 2"  (1.88 m)   Wt 239 lb 4 oz (108.5 kg)   SpO2 95%   BMI 30.72  kg/m  Affect appropriate Healthy:  appears stated age 43: normal Neck supple with no adenopathy JVP normal no bruits no thyromegaly Lungs clear with no wheezing and good diaphragmatic motion Heart:  S1/S2 no murmur, no rub, gallop or click PMI normal AICD under left clavicle Abdomen: benighn, BS positve, no tenderness, no AAA no bruit.  No HSM or HJR Distal pulses intact with no bruits No edema Neuro non-focal Skin warm and dry No muscular weakness    Current Outpatient Medications  Medication Sig Dispense Refill  . allopurinol (ZYLOPRIM) 300 MG tablet TAKE 1 TABLET BY MOUTH EVERY DAY 90 tablet 0  . aspirin 81 MG tablet Take 81 mg by mouth daily.     [name] atorvastatin (LIPITOR) 20 MG tablet Take 1  tablet (20 mg total) by mouth daily. Annual appt w/labs due in September must see MD for refills 90 tablet 0  . budesonide-formoterol (SYMBICORT) 80-4.5 MCG/ACT inhaler Inhale 2 puffs into the lungs 2 (two) times daily as needed (wheezing). 1 Inhaler 11  . carvedilol (COREG) 6.25 MG tablet TAKE 1 TABLET BY MOUTH TWICE A DAY WITH A MEAL 60 tablet 11  . cetirizine (ZYRTEC) 10 MG tablet Take 10 mg by mouth daily as needed for allergies.     . Coenzyme Q10 (CO Q 10) 100 MG CAPS Take 1 capsule by mouth daily.    [name] COLCRYS 0.6 MG tablet TAKE 1 TABLET BY MOUTH TWICE A DAY 60 tablet 0  . EFFIENT 10 MG TABS tablet TAKE 1 TABLET BY MOUTH EVERY DAY 30 tablet 0  . metolazone (ZAROXOLYN) 5 MG tablet TAKE 1 TABLET (5 MG TOTAL) BY MOUTH DAILY AS NEEDED (FOR EDEMA). 30 tablet 9  . mexiletine (MEXITIL) 200 MG capsule Take 1 capsule (200 mg total) by mouth 2 (two) times daily. 60 capsule 6  . mometasone (NASONEX) 50 MCG/ACT nasal spray Place 2 sprays into the nose daily as needed (allergies).    . mometasone-formoterol (DULERA) 200-5 MCG/ACT AERO Inhale 2 puffs into the lungs 2 (two) times daily. 3 Inhaler 3  . potassium chloride SA (K-DUR,KLOR-CON) 20 MEQ tablet Take 2 tablets (40 mEq total) by mouth daily. 60 tablet 11  . predniSONE (STERAPRED UNI-PAK 21 TAB) 10 MG (21) TBPK tablet Take 4 tablets (40 mg total) daily for 4 days by mouth, THEN 3 tablets (30 mg total) daily for 4 days, THEN 2 tablets (20 mg total) daily for 4 days, THEN 1 tablet (10 mg total) daily for 4 days. Then take 1/2 tablet (0.5 mg ) daily for 4 days by mouth. 42 tablet 0  . PROAIR HFA 108 (90 Base) MCG/ACT inhaler INHALE 2 PUFFS EVERY 4 HRS AS NEEDED *ONLY IF CANT CATCH BREATH* PHYSICAL DUE 8.5 Inhaler 0  . sacubitril-valsartan (ENTRESTO) 24-26 MG Take 1 tablet by mouth 2 (two) times daily. 60 tablet 11  . spironolactone (ALDACTONE) 25 MG tablet Take 0.5 tablets (12.5 mg total) by mouth daily.    [name] tiZANidine (ZANAFLEX) 4 MG tablet TAKE 1  TABLET BY MOUTH EVERY 6 HOURS AS NEEDED SPASMS OF THE MUSCLE 60 tablet 0  . torsemide (DEMADEX) 20 MG tablet Take 2 tablets (40 mg total) 2 (two) times daily by mouth. 120 tablet 6   No current facility-administered medications for this visit.     Allergies  Levaquin [levofloxacin in d5w]; Ace inhibitors; Crestor [rosuvastatin]; Lunesta [eszopiclone]; and Zocor [simvastatin]  Electrocardiogram:  05/09/15  NSR rate 74  biatrial enlargement old anterior MI   QT  390    Assessment and Plan VT/AICD: He has had intercurrent monomorphic ventricular tachycardia treated on one occasion successfully with ATP and on the other occasion was classified as SVT is on no therapy was given. Dr [name] reprogrammed his device to exclude onset as a criteria for discrimination. We have lowered the VT detection 280 bpm and created a VT 1 therapy zone with only ATP.  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  Continue steroid taper ESR still elevated consistent with? Myositis labs in 3 weeks   Hematuria  ? Related to autoimmune phenomenon he has f/u with [place]rolog[place] [place]

## 2016-12-23 NOTE — Telephone Encounter (Signed)
-----   Message from Wendall Stade, MD sent at 12/23/2016  9:46 AM EST ----- Still having swelling stop lasix call in demedex 40 bid. Call in prednisone dose pack 40 mg for 4 days 30 mg for 4 days 20 mg for 4 days 10 mg for 4 days 5 mg for 4 days Dispense 42 , 10 mg tabs  BMET in 2 weeks

## 2016-12-24 ENCOUNTER — Other Ambulatory Visit: Payer: Self-pay | Admitting: Cardiovascular Disease

## 2016-12-26 ENCOUNTER — Ambulatory Visit (INDEPENDENT_AMBULATORY_CARE_PROVIDER_SITE_OTHER): Payer: Self-pay | Admitting: Cardiovascular Disease

## 2016-12-26 ENCOUNTER — Encounter: Payer: Self-pay | Admitting: Cardiovascular Disease

## 2016-12-26 VITALS — BP 110/72 | HR 87 | Ht 74.0 in | Wt 239.2 lb

## 2016-12-26 DIAGNOSIS — I5022 Chronic systolic (congestive) heart failure: Secondary | ICD-10-CM

## 2016-12-26 DIAGNOSIS — I2510 Atherosclerotic heart disease of native coronary artery without angina pectoris: Secondary | ICD-10-CM

## 2016-12-26 DIAGNOSIS — E7800 Pure hypercholesterolemia, unspecified: Secondary | ICD-10-CM

## 2016-12-26 MED ORDER — PREDNISONE 10 MG (21) PO TBPK: ORAL_TABLET | ORAL | 0 refills | Status: AC

## 2016-12-26 NOTE — Patient Instructions (Addendum)
Medication Instructions:  Your physician recommends that you continue on your current medications as directed. Please refer to the Current Medication list given to you today.  Labwork: Your physician recommends that you return for lab work in: 3 weeks for SED RATE and BMET  Testing/Procedures: NONE  Follow-Up: Your physician wants you to follow-up next available Dr. Eden Emms.    If you need a refill on your cardiac medications before your next appointment, please call your pharmacy.

## 2016-12-26 NOTE — Addendum Note (Signed)
Addended by: Virl Axe, Jeslyn Amsler L on: 12/26/2016 11:06 AM   Modules accepted: Orders

## 2017-01-01 ENCOUNTER — Encounter: Payer: Self-pay | Admitting: Internal Medicine

## 2017-01-01 ENCOUNTER — Ambulatory Visit (INDEPENDENT_AMBULATORY_CARE_PROVIDER_SITE_OTHER): Payer: Self-pay | Admitting: Internal Medicine

## 2017-01-01 ENCOUNTER — Ambulatory Visit (INDEPENDENT_AMBULATORY_CARE_PROVIDER_SITE_OTHER)
Admission: RE | Admit: 2017-01-01 | Discharge: 2017-01-01 | Disposition: A | Discharge status: Home / Self Care | Payer: Self-pay | Source: Ambulatory Visit | Attending: Internal Medicine | Admitting: Internal Medicine

## 2017-01-01 VITALS — BP 124/82 | HR 86 | Temp 98.0°F | Ht 74.0 in | Wt 240.0 lb

## 2017-01-01 DIAGNOSIS — M2550 Pain in unspecified joint: Secondary | ICD-10-CM

## 2017-01-01 DIAGNOSIS — R748 Abnormal levels of other serum enzymes: Secondary | ICD-10-CM

## 2017-01-01 DIAGNOSIS — R059 Cough, unspecified: Secondary | ICD-10-CM

## 2017-01-01 DIAGNOSIS — E7800 Pure hypercholesterolemia, unspecified: Secondary | ICD-10-CM

## 2017-01-01 DIAGNOSIS — R700 Elevated erythrocyte sedimentation rate: Secondary | ICD-10-CM

## 2017-01-01 DIAGNOSIS — F419 Anxiety disorder, unspecified: Secondary | ICD-10-CM

## 2017-01-01 DIAGNOSIS — R05 Cough: Secondary | ICD-10-CM

## 2017-01-01 MED ORDER — ESCITALOPRAM OXALATE 10 MG PO TABS: 10.0000 mg | ORAL_TABLET | Freq: Every day | ORAL | 3 refills | Status: DC

## 2017-01-01 MED ORDER — TRAMADOL HCL 50 MG PO TABS: 50.0000 mg | ORAL_TABLET | Freq: Three times a day (TID) | ORAL | 1 refills | Status: DC | PRN

## 2017-01-01 NOTE — Progress Notes (Signed)
Subjective:    Patient ID: Jason Hogan, male    DOB: 05-03-1975, 41 y.o.   MRN: [id number]  HPI  Here to f/u, Here with acute onset mild to mod 2-3 days ST, HA, general weakness and malaise, with prod cough greenish sputum, but Pt denies chest pain, increased sob or doe, wheezing, orthopnea, PND, increased LE swelling, palpitations, dizziness or syncope.  Still having multiple polyarthralgias not well explained at previous local Rheurm evaluation.  Denies worsening depressive symptoms, suicidal ideation, or panic; has ongoing anxiety, asks for new tx. Past Medical History:  Diagnosis Date  . Allergic rhinitis, cause unspecified 10/03/2011  . Anxiety 10/03/2011  . Asthma   . ASTHMA 09/20/2006   Qualifier: Diagnosis of  By: Jonny Ruiz MD, Len Blalock   . 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 Bacterial pneumonia   . CAD (coronary artery disease)   . CARDIOMYOPATHY, ISCHEMIC 10/20/2009   Qualifier: Diagnosis of  By: Graciela Husbands, MD, Ruthann Cancer Ty Hilts   . Cholelithiasis   . CHOLELITHIASIS 06/22/2007   Qualifier: Diagnosis of  By: Maris Berger   . Chronic systolic heart failure (HCC) 09/18/2009   Qualifier: Diagnosis of  By: Kem Parkinson    . Depression   . DEPRESSION 06/22/2007   Qualifier: Diagnosis of  By: Maris Berger   . Elevated blood pressure   . Heart failure   . Hypercholesteremia   . HYPERCHOLESTEROLEMIA 09/18/2009   Qualifier: Diagnosis of  By: Kem Parkinson    . Hyperlipidemia   . Impaired glucose tolerance 10/05/2012  . Insomnia   . INSOMNIA-SLEEP DISORDER-UNSPEC 06/22/2007   Qualifier: Diagnosis of  By: Jonny Ruiz MD, Len Blalock   . 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 Migraine headache   . MIGRAINE HEADACHE 09/18/2009   Qualifier: Diagnosis of  By: Kem Parkinson    . MYOCARDIAL INFARCTION 09/18/2009   Qualifier: Diagnosis of  By: Kem Parkinson    . Myocardial infarction (HCC)   .  Neck pain    right  . Renal calculus   . RENAL CALCULUS, HX OF 09/20/2006   Qualifier: Diagnosis of  By: Jonny Ruiz MD, Len Blalock   . Renal insufficiency   . RENAL INSUFFICIENCY 09/18/2009   Qualifier: Diagnosis of  By: Kem Parkinson    . Respiratory failure (HCC)   . Respiratory failure (HCC)   . Rhabdomyolysis   . Rhabdomyolysis 09/18/2009   Qualifier: Diagnosis of  By: Kem Parkinson    . URI (upper respiratory infection)    Past Surgical History:  Procedure Laterality Date  . Left Heart Cath and Coronary Angiography N/A 05/17/2015   Performed by Wendall Stade, MD at [facility] INVASIVE CV LAB    reports that he quit smoking about 22 years ago. He has a 0.20 pack-year smoking history. he has never used smokeless tobacco. He reports that he drinks alcohol. He reports that he does not use drugs. family history includes Diabetes in his unknown relative; Heart attack in his father; Heart disease in his unknown relative; Hypothyroidism in his unknown relative. Allergies  Allergen Reactions  . Levaquin [Levofloxacin In D5w] Other (See Comments)  . Ace Inhibitors Cough  . Crestor [Rosuvastatin]     Sweet craving  . Lunesta [Eszopiclone]     Bitter taste  . Zocor [Simvastatin]     Memory problem   Current Outpatient Medications on File Prior to Visit  Medication Sig Dispense Refill  .  allopurinol (ZYLOPRIM) 300 MG tablet TAKE 1 TABLET BY MOUTH EVERY DAY 90 tablet 0  . aspirin 81 MG tablet Take 81 mg by mouth daily.     . budesonide-formoterol (SYMBICORT) 80-4.5 MCG/ACT inhaler Inhale 2 puffs into the lungs 2 (two) times daily as needed (wheezing). 1 Inhaler 11  . carvedilol (COREG) 6.25 MG tablet TAKE 1 TABLET BY MOUTH TWICE A DAY WITH A MEAL 60 tablet 11  . cetirizine (ZYRTEC) 10 MG tablet Take 10 mg by mouth daily as needed for allergies.     . Coenzyme Q10 (CO Q 10) 100 MG CAPS Take 1 capsule by mouth daily.    [name] COLCRYS 0.6 MG tablet TAKE 1 TABLET BY MOUTH TWICE A DAY 60 tablet 0  .  EFFIENT 10 MG TABS tablet TAKE 1 TABLET BY MOUTH EVERY DAY 30 tablet 0  . metolazone (ZAROXOLYN) 5 MG tablet TAKE 1 TABLET (5 MG TOTAL) BY MOUTH DAILY AS NEEDED (FOR EDEMA). 30 tablet 9  . mexiletine (MEXITIL) 200 MG capsule Take 1 capsule (200 mg total) by mouth 2 (two) times daily. 60 capsule 6  . mometasone (NASONEX) 50 MCG/ACT nasal spray Place 2 sprays into the nose daily as needed (allergies).    . mometasone-formoterol (DULERA) 200-5 MCG/ACT AERO Inhale 2 puffs into the lungs 2 (two) times daily. 3 Inhaler 3  . potassium chloride SA (K-DUR,KLOR-CON) 20 MEQ tablet Take 2 tablets (40 mEq total) by mouth daily. 60 tablet 11  . predniSONE (STERAPRED UNI-PAK 21 TAB) 10 MG (21) TBPK tablet Take 4 tablets (40 mg total) daily for 4 days by mouth, THEN 3 tablets (30 mg total) daily for 4 days, THEN 2 tablets (20 mg total) daily for 4 days, THEN 1 tablet (10 mg total) daily for 4 days. Then take 1/2 tablet (5 mg ) daily for 4 days by mouth. 42 tablet 0  . PROAIR HFA 108 (90 Base) MCG/ACT inhaler INHALE 2 PUFFS EVERY 4 HRS AS NEEDED *ONLY IF CANT CATCH BREATH* PHYSICAL DUE 8.5 Inhaler 0  . sacubitril-valsartan (ENTRESTO) 24-26 MG Take 1 tablet by mouth 2 (two) times daily. 60 tablet 11  . spironolactone (ALDACTONE) 25 MG tablet Take 0.5 tablets (12.5 mg total) by mouth daily.    [name] tiZANidine (ZANAFLEX) 4 MG tablet TAKE 1 TABLET BY MOUTH EVERY 6 HOURS AS NEEDED SPASMS OF THE MUSCLE 60 tablet 0  . torsemide (DEMADEX) 20 MG tablet Take 2 tablets (40 mg total) 2 (two) times daily by mouth. 120 tablet 6   No current facility-administered medications on file prior to visit.    Review of Systems  Constitutional: Negative for other unusual diaphoresis or sweats HENT: Negative for ear discharge or swelling Eyes: Negative for other worsening visual disturbances Respiratory: Negative for stridor or other swelling  Gastrointestinal: Negative for worsening distension or other blood Genitourinary: Negative for  retention or other urinary change Musculoskeletal: Negative for other MSK pain or swelling Skin: Negative for color change or other new lesions Neurological: Negative for worsening tremors and other numbness  Psychiatric/Behavioral: Negative for worsening agitation or other fatigue All other system neg per pt    Objective:   Physical Exam BP 124/82   Pulse 86   Temp 98 F (36.7 C) (Oral)   Ht 6\' 2"  (1.88 m)   Wt 240 lb (108.9 kg)   SpO2 98%   BMI 30.81 kg/m  VS noted, mild ill Constitutional: Pt appears in NAD HENT: Head: NCAT.  Right Ear: External ear  normal.  Left Ear: External ear normal.  Eyes: . Pupils are equal, round, and reactive to light. Conjunctivae and EOM are normal Nose: without d/c or deformity Bilat tm's with mild erythema.  Max sinus areas non tender.  Pharynx with mild erythema, no exudate Neck: Neck supple. Gross normal ROM Cardiovascular: Normal rate and regular rhythm.   Pulmonary/Chest: Effort normal and breath sounds without rales or wheezing.  No joint effusions or tenderness Neurological: Pt is alert. At baseline orientation, motor grossly intact Skin: Skin is warm. No rashes, other new lesions, no LE edema Psychiatric: Pt behavior is normal without agitation , + mild nevous    Assessment & Plan:

## 2017-01-01 NOTE — Patient Instructions (Addendum)
OK to stop the Lipitor for now  Please take all new medication as prescribed - the tramadol for pain as needed, and the lexapro 10 mg for nerves  You will be contacted regarding the referral for: Rheumatology - [facility]  Please continue all other medications as before, and refills have been done if requested.  Please have the pharmacy call with any other refills you may need.  Please continue your efforts at being more active, low cholesterol diet, and weight control.  You are otherwise up to date with prevention measures today.  Please keep your appointments with your specialists as you may have planned   Please go to the XRAY Department in the Basement (go straight as you get off the elevator) for the x-ray testing  You will be contacted by phone if any changes need to be made immediately.  Otherwise, you will receive a letter about your results with an explanation, but please check with MyChart first.  Please remember to sign up for MyChart if you have not done so, as this will be important to you in the future with finding out test results, communicating by private email, and scheduling acute appointments online when needed.  Please return in 6 months, or sooner if needed

## 2017-01-04 NOTE — Assessment & Plan Note (Signed)
D/c statin due to liver risk,  to f/u any worsening symptoms or concerns

## 2017-01-04 NOTE — Assessment & Plan Note (Signed)
Mild to mod, for antibx course,  to f/u any worsening symptoms or concerns 

## 2017-01-04 NOTE — Assessment & Plan Note (Signed)
Ok for lexapro 10 qd

## 2017-01-04 NOTE — Assessment & Plan Note (Signed)
With hx of elev sed rate and cpk, for [facility] rheum referral,  to f/u any worsening symptoms or concerns

## 2017-01-07 ENCOUNTER — Other Ambulatory Visit: Payer: Self-pay

## 2017-01-16 ENCOUNTER — Other Ambulatory Visit: Payer: Self-pay

## 2017-01-30 ENCOUNTER — Telehealth: Payer: Self-pay | Admitting: Internal Medicine

## 2017-01-30 NOTE — Telephone Encounter (Signed)
Copied from CRM [id number]. Topic: Referral - Request >> Jan 30, 2017 12:37 PM Waymon Amato wrote: Reason for CRM: pt is requesting an ultrasound to be scheduled Would like to get my right shoulder looked at with ultrasound. The inflammation from the cyst and bursitis has been pretty bad the past couple of weeks. Triggering migraine headaches. Or does he need to be scheduled for an office visit   Best number [phone]

## 2017-01-30 NOTE — Telephone Encounter (Signed)
Pt would need an appt. Please schedule.

## 2017-01-31 NOTE — Telephone Encounter (Signed)
Spoke with patient. He was wanting to see Dr. Katrinka Blazing, he has seen him in the pass about his shoulder. The message was ment for him. I have made him a appointment for 12/18 @ 330.

## 2017-02-04 ENCOUNTER — Ambulatory Visit (INDEPENDENT_AMBULATORY_CARE_PROVIDER_SITE_OTHER): Payer: Self-pay | Admitting: Family Medicine

## 2017-02-04 ENCOUNTER — Ambulatory Visit: Payer: Self-pay

## 2017-02-04 ENCOUNTER — Encounter: Payer: Self-pay | Admitting: Family Medicine

## 2017-02-04 VITALS — BP 118/90 | HR 79 | Ht 73.0 in | Wt 240.0 lb

## 2017-02-04 DIAGNOSIS — M25511 Pain in right shoulder: Secondary | ICD-10-CM

## 2017-02-04 DIAGNOSIS — G8929 Other chronic pain: Secondary | ICD-10-CM

## 2017-02-04 DIAGNOSIS — M7551 Bursitis of right shoulder: Secondary | ICD-10-CM

## 2017-02-04 MED ORDER — VITAMIN D (ERGOCALCIFEROL) 1.25 MG (50000 UNIT) PO CAPS: 50000.0000 [IU] | ORAL_CAPSULE | ORAL | 0 refills | Status: DC

## 2017-02-04 NOTE — Assessment & Plan Note (Signed)
Patient given an injection today.  Good resolution of pain.  We discussed the muscle imbalances.  Discussed working more on posture, ergonomics, as well as proper lifting technique.  Patient will change range of motion with his lifting.  Discussed the topical anti-inflammatories and the icing regimen.  Follow-up again 4 weeks

## 2017-02-04 NOTE — Progress Notes (Signed)
Jason Hogan [address] [place], [place] [address] Phone: [phone] Subjective:      CC: Right shoulder pain  IBB:[id number]  Jason Hogan is a 41 y.o. male coming in with complaint of shoulder pain. He continues to have right shoulder pain that is causing migraines that last weeks. He has been using flexeril and tramdol as well as BC powders. His pain is mostly on the anterior shoulder with shoulder abduction and flexion.  Patient continues to workout on a regular basis.    Past Medical History:  Diagnosis Date  . Allergic rhinitis, cause unspecified 10/03/2011  . Anxiety 10/03/2011  . Asthma   . ASTHMA 09/20/2006   Qualifier: Diagnosis of  By: Jonny Ruiz MD, Len Blalock   . 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 Bacterial pneumonia   . CAD (coronary artery disease)   . CARDIOMYOPATHY, ISCHEMIC 10/20/2009   Qualifier: Diagnosis of  By: Graciela Husbands, MD, Ruthann Cancer Ty Hilts   . Cholelithiasis   . CHOLELITHIASIS 06/22/2007   Qualifier: Diagnosis of  By: Maris Berger   . Chronic systolic heart failure (HCC) 09/18/2009   Qualifier: Diagnosis of  By: Kem Parkinson    . Depression   . DEPRESSION 06/22/2007   Qualifier: Diagnosis of  By: Maris Berger   . Elevated blood pressure   . Heart failure   . Hypercholesteremia   . HYPERCHOLESTEROLEMIA 09/18/2009   Qualifier: Diagnosis of  By: Kem Parkinson    . Hyperlipidemia   . Impaired glucose tolerance 10/05/2012  . Insomnia   . INSOMNIA-SLEEP DISORDER-UNSPEC 06/22/2007   Qualifier: Diagnosis of  By: Jonny Ruiz MD, Len Blalock   . 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 Migraine headache   . MIGRAINE HEADACHE 09/18/2009   Qualifier: Diagnosis of  By: Kem Parkinson    . MYOCARDIAL INFARCTION 09/18/2009   Qualifier: Diagnosis of  By: Kem Parkinson    . Myocardial infarction (HCC)   . Neck pain    right  . Renal calculus     . RENAL CALCULUS, HX OF 09/20/2006   Qualifier: Diagnosis of  By: Jonny Ruiz MD, Len Blalock   . Renal insufficiency   . RENAL INSUFFICIENCY 09/18/2009   Qualifier: Diagnosis of  By: Kem Parkinson    . Respiratory failure (HCC)   . Respiratory failure (HCC)   . Rhabdomyolysis   . Rhabdomyolysis 09/18/2009   Qualifier: Diagnosis of  By: Kem Parkinson    . URI (upper respiratory infection)    Past Surgical History:  Procedure Laterality Date  . CARDIAC CATHETERIZATION N/A 05/17/2015   Procedure: Left Heart Cath and Coronary Angiography;  Surgeon: Wendall Stade, MD;  Location: [facility] INVASIVE CV LAB;  Service: Cardiovascular;  Laterality: N/A;   Social History   Socioeconomic History  . Marital status: Married    Spouse name: None  . Number of children: 0  . Years of education: None  . Highest education level: None  Social Needs  . Financial resource strain: None  . Food insecurity - worry: None  . Food insecurity - inability: None  . Transportation needs - medical: None  . Transportation needs - non-medical: None  Occupational History  . 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 Smoking status: Former Smoker    Packs/day: 0.10    Years: 2.00    Pack years: 0.20    Last  attempt to quit: 02/18/1994    Years since quitting: 22.9  . Smokeless tobacco: Never Used  Substance and Sexual Activity  . Alcohol use: Yes  . Drug use: No  . Sexual activity: None  Other Topics Concern  . None  Social History Narrative  . None   Allergies  Allergen Reactions  . Levaquin [Levofloxacin In D5w] Other (See Comments)  . Ace Inhibitors Cough  . Crestor [Rosuvastatin]     Sweet craving  . Lunesta [Eszopiclone]     Bitter taste  . Zocor [Simvastatin]     Memory problem   Family History  Problem Relation Age of Onset  . Heart attack Father   . Heart disease Unknown   . Diabetes Unknown   . Hypothyroidism Unknown      Past medical history, social, surgical and  family history all reviewed in electronic medical record.  No pertanent information unless stated regarding to the chief complaint.   Review of Systems:Review of systems updated and as accurate as of 02/04/17  No headache, visual changes, nausea, vomiting, diarrhea, constipation, dizziness, abdominal pain, skin rash, fevers, chills, night sweats, weight loss, swollen lymph nodes, body aches, joint swelling, chest pain, shortness of breath, mood changes.  Positive muscle aches Objective  Blood pressure 118/90, pulse 79, height 6\' 1"  (1.854 m), weight 240 lb (108.9 kg), SpO2 96 %. Systems examined below as of 02/04/17   General: No apparent distress alert and oriented x3 mood and affect normal, dressed appropriately.  HEENT: Pupils equal, extraocular movements intact  Respiratory: Patient's speak in full sentences and does not appear short of breath  Cardiovascular: No lower extremity edema, non tender, no erythema  Skin: Warm dry intact with no signs of infection or rash on extremities or on axial skeleton.  Abdomen: Soft nontender  Neuro: Cranial nerves II through XII are intact, neurovascularly intact in all extremities with 2+ DTRs and 2+ pulses.  Lymph: No lymphadenopathy of posterior or anterior cervical chain or axillae bilaterally.  Gait normal with good balance and coordination.  MSK:  Non tender with full range of motion and good stability and symmetric strength and tone of  elbows, wrist, hip, knee and ankles bilaterally.  Shoulder: Right Inspection reveals no abnormalities, atrophy or asymmetry. Palpation is normal with no tenderness over AC joint or bicipital groove. ROM is full in all planes passively. Rotator cuff strength normal throughout. signs of impingement with positive Neer and Hawkin's tests, but negative empty can sign. Speeds and Yergason's tests normal. No labral pathology noted with negative Obrien's, negative clunk and good stability. Normal scapular function  observed. No painful arc and no drop arm sign. No apprehension sign  MSK US performed of: Right This study was ordered, performed, and interpreted by Terrilee FilesZach Smith D.O.  Shoulder:   Supraspinatus:  Appears normal on long and transverse views, Bursal bulge seen with shoulder abduction on impingement view. Infraspinatus:  Appears normal on long and transverse views. Significant increase in Doppler flow Subscapularis:  Appears normal on long and transverse views. Positive bursa Teres Minor:  Appears normal on long and transverse views. AC joint:  Capsule undistended, no geyser sign. Glenohumeral Joint:  Appears normal without effusion. Glenoid Labrum:  Intact without visualized tears. Biceps Tendon:  Appears normal on long and transverse views, no fraying of tendon, tendon located in intertubercular groove, no subluxation with shoulder internal or external rotation.  Impression: Subacromial bursitis  Procedure: Real-time Ultrasound Guided Injection of right glenohumeral joint Device: GE Logiq E  Ultrasound guided injection is preferred based studies that show increased duration, increased effect, greater accuracy, decreased procedural pain, increased response rate with ultrasound guided versus blind injection.  Verbal informed consent obtained.  Time-out conducted.  Noted no overlying erythema, induration, or other signs of local infection.  Skin prepped in a sterile fashion.  Local anesthesia: Topical Ethyl chloride.  With sterile technique and under real time ultrasound guidance:  Joint visualized.  23g 1  inch needle inserted posterior approach. Pictures taken for needle placement. Patient did have injection of 2 cc of 1% lidocaine, 2 cc of 0.5% Marcaine, and 1.0 cc of Kenalog 40 mg/dL. Completed without difficulty  Pain immediately resolved suggesting accurate placement of the medication.  Advised to call if fevers/chills, erythema, induration, drainage, or persistent bleeding.  Images  permanently stored and available for review in the ultrasound unit.  Impression: Technically successful ultrasound guided injection.   Impression and Recommendations:     This case required medical decision making of moderate complexity.      Note: This dictation was prepared with Dragon dictation along with smaller phrase technology. Any transcriptional errors that result from this process are unintentional.

## 2017-02-04 NOTE — Patient Instructions (Signed)
Good to see you  Jason Hogan is your friend. Ice 20 minutes 2 times daily. Usually after activity and before bed. Once weekly vitamin D for 12 weeks.  Stay active but keep hands within peripheral vision.  See me again in 4 weeks if not gone.  Happy holidays!

## 2017-02-06 ENCOUNTER — Telehealth: Payer: Self-pay | Admitting: Cardiology

## 2017-02-06 ENCOUNTER — Ambulatory Visit (INDEPENDENT_AMBULATORY_CARE_PROVIDER_SITE_OTHER): Payer: Self-pay | Admitting: *Deleted

## 2017-02-06 DIAGNOSIS — I255 Ischemic cardiomyopathy: Secondary | ICD-10-CM

## 2017-02-06 NOTE — Telephone Encounter (Signed)
LMOVM reminding pt to send remote transmission.   

## 2017-02-07 ENCOUNTER — Encounter: Payer: Self-pay | Admitting: Cardiology

## 2017-02-07 NOTE — Progress Notes (Signed)
Remote ICD transmission.   

## 2017-02-14 ENCOUNTER — Encounter: Payer: Self-pay | Admitting: Cardiovascular Disease

## 2017-02-14 DIAGNOSIS — M2550 Pain in unspecified joint: Secondary | ICD-10-CM

## 2017-02-14 DIAGNOSIS — R700 Elevated erythrocyte sedimentation rate: Secondary | ICD-10-CM

## 2017-02-14 DIAGNOSIS — M353 Polymyalgia rheumatica: Secondary | ICD-10-CM

## 2017-02-17 ENCOUNTER — Encounter: Payer: Self-pay | Admitting: *Deleted

## 2017-02-17 ENCOUNTER — Other Ambulatory Visit: Payer: Self-pay | Admitting: *Deleted

## 2017-02-17 DIAGNOSIS — E7800 Pure hypercholesterolemia, unspecified: Secondary | ICD-10-CM

## 2017-02-17 DIAGNOSIS — M353 Polymyalgia rheumatica: Secondary | ICD-10-CM

## 2017-02-17 DIAGNOSIS — R700 Elevated erythrocyte sedimentation rate: Secondary | ICD-10-CM

## 2017-02-17 DIAGNOSIS — I5022 Chronic systolic (congestive) heart failure: Secondary | ICD-10-CM

## 2017-02-17 DIAGNOSIS — M2550 Pain in unspecified joint: Secondary | ICD-10-CM

## 2017-02-17 DIAGNOSIS — I2510 Atherosclerotic heart disease of native coronary artery without angina pectoris: Secondary | ICD-10-CM

## 2017-02-19 LAB — CUP PACEART REMOTE DEVICE CHECK
Battery Remaining Longevity: 38 mo
Battery Remaining Percentage: 38 %
HighPow Impedance: 57 Ohm
Implantable Lead Implant Date: 20111010
Implantable Lead Location: 753860
Implantable Lead Model: 7121
Implantable Pulse Generator Implant Date: 20111010
Lead Channel Pacing Threshold Pulse Width: 1 ms
Lead Channel Sensing Intrinsic Amplitude: 11.9 mV
Lead Channel Setting Pacing Amplitude: 4 V
Lead Channel Setting Pacing Pulse Width: 1 ms
MDC IDC MSMT BATTERY VOLTAGE: 2.92 V
MDC IDC MSMT LEADCHNL RV IMPEDANCE VALUE: 560 Ohm
MDC IDC MSMT LEADCHNL RV PACING THRESHOLD AMPLITUDE: 2 V
MDC IDC PG SERIAL: 615318
MDC IDC SESS DTM: 20181221101802
MDC IDC SET LEADCHNL RV SENSING SENSITIVITY: 0.5 mV
MDC IDC STAT BRADY RV PERCENT PACED: 1 %

## 2017-02-19 LAB — ANA: ANA: POSITIVE — AB

## 2017-02-19 LAB — BASIC METABOLIC PANEL
BUN/Creatinine Ratio: 18 (ref 9–20)
BUN: 21 mg/dL (ref 6–24)
CALCIUM: 9.4 mg/dL (ref 8.7–10.2)
CHLORIDE: 98 mmol/L (ref 96–106)
CO2: 25 mmol/L (ref 20–29)
Creatinine, Ser: 1.19 mg/dL (ref 0.76–1.27)
GFR calc Af Amer: 87 mL/min/{1.73_m2} (ref >59)
GFR calc non Af Amer: 75 mL/min/{1.73_m2} (ref >59)
GLUCOSE: 73 mg/dL (ref 65–99)
Potassium: 4.7 mmol/L (ref 3.5–5.2)
Sodium: 140 mmol/L (ref 134–144)

## 2017-02-19 LAB — PRO B NATRIURETIC PEPTIDE: NT-Pro BNP: 104 pg/mL — ABNORMAL HIGH (ref 0–86)

## 2017-02-19 LAB — SEDIMENTATION RATE: SED RATE: 16 mm/h — AB (ref 0–15)

## 2017-02-26 ENCOUNTER — Other Ambulatory Visit: Payer: Self-pay | Admitting: Internal Medicine

## 2017-03-03 ENCOUNTER — Encounter: Payer: Self-pay | Admitting: Family Medicine

## 2017-03-04 ENCOUNTER — Encounter: Payer: Self-pay | Admitting: Family Medicine

## 2017-03-04 ENCOUNTER — Ambulatory Visit (INDEPENDENT_AMBULATORY_CARE_PROVIDER_SITE_OTHER): Payer: Self-pay | Admitting: Family Medicine

## 2017-03-04 VITALS — BP 140/84 | HR 83 | Ht 74.0 in | Wt 255.0 lb

## 2017-03-04 DIAGNOSIS — M75101 Unspecified rotator cuff tear or rupture of right shoulder, not specified as traumatic: Secondary | ICD-10-CM

## 2017-03-04 DIAGNOSIS — S43431A Superior glenoid labrum lesion of right shoulder, initial encounter: Secondary | ICD-10-CM

## 2017-03-04 MED ORDER — PREDNISONE 50 MG PO TABS: 50.0000 mg | ORAL_TABLET | Freq: Every day | ORAL | 0 refills | Status: DC

## 2017-03-04 NOTE — Progress Notes (Signed)
[name], [name] Subjective:     CC: Right shoulder pain  TIW:[id number]  Jason Hogan is a 42 y.o. male coming in with complaint of right shoulder pain.  Patient was found to have more of a shoulder bursitis but has had a chronic bicep subluxation.  This is been going on intermittently for greater than 2 years.  He did have a CT scan done in 2016 showing medial displacement of the long head of the bicep with partial-thickness articular surface tear.  Patient's most recent ultrasound by me showed the patient continues to have a chronic subluxation of the long head of the bicep and did have what appeared to be an anterior labral tear.  Responded to 3 weeks of improvement with the injection.  Does not appear to be cervical radiculopathy.  Over the course last week severely increasing pain again.  Seems to be localized to the anterior aspect of the shoulder.  The packs daily activity where patient is unable to even hold 10 pounds or even sleep on that side.  Has failed all other conservative therapy including home exercises, physical therapy and injections.  Onset-  Location Duration-  Character- Aggravating factors- Reliving factors-  Therapies tried-  Severity-     Past Medical History:  Diagnosis Date  . Allergic rhinitis, cause unspecified 10/03/2011  . Anxiety 10/03/2011  . Asthma   . ASTHMA 09/20/2006   Qualifier: Diagnosis of  By: Jonny Ruiz MD, Len Blalock   . 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 Bacterial pneumonia   . CAD (coronary artery disease)   . CARDIOMYOPATHY, ISCHEMIC 10/20/2009   Qualifier: Diagnosis of  By: Graciela Husbands, MD, Ruthann Cancer Ty Hilts   . Cholelithiasis   . CHOLELITHIASIS 06/22/2007   Qualifier: Diagnosis of  By: Maris Berger   . Chronic systolic heart failure (HCC) 09/18/2009   Qualifier:  Diagnosis of  By: Kem Parkinson    . Depression   . DEPRESSION 06/22/2007   Qualifier: Diagnosis of  By: Maris Berger   . Elevated blood pressure   . Heart failure   . Hypercholesteremia   . HYPERCHOLESTEROLEMIA 09/18/2009   Qualifier: Diagnosis of  By: Kem Parkinson    . Hyperlipidemia   . Impaired glucose tolerance 10/05/2012  . Insomnia   . INSOMNIA-SLEEP DISORDER-UNSPEC 06/22/2007   Qualifier: Diagnosis of  By: Jonny Ruiz MD, Len Blalock   . 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 Migraine headache   . MIGRAINE HEADACHE 09/18/2009   Qualifier: Diagnosis of  By: Kem Parkinson    . MYOCARDIAL INFARCTION 09/18/2009   Qualifier: Diagnosis of  By: Kem Parkinson    . Myocardial infarction (HCC)   . Neck pain    right  . Renal calculus   . RENAL CALCULUS, HX OF 09/20/2006   Qualifier: Diagnosis of  By: Jonny Ruiz MD, Len Blalock   . Renal insufficiency   . RENAL INSUFFICIENCY 09/18/2009   Qualifier: Diagnosis of  By: Kem Parkinson    . Respiratory failure (HCC)   . Respiratory failure (HCC)   . Rhabdomyolysis   . Rhabdomyolysis 09/18/2009   Qualifier: Diagnosis of  By: Kem Parkinson    . URI (upper respiratory infection)    Past Surgical History:  Procedure Laterality Date  . CARDIAC CATHETERIZATION N/A 05/17/2015   Procedure: Left Heart Cath and Coronary Angiography;  Surgeon: Theron Arista  Phillips Hay, MD;  Location: [facility] INVASIVE CV LAB;  Service: Cardiovascular;  Laterality: N/A;   Social History   Socioeconomic History  . Marital status: Married    Spouse name: None  . Number of children: 0  . Years of education: None  . Highest education level: None  Social Needs  . Financial resource strain: None  . Food insecurity - worry: None  . Food insecurity - inability: None  . Transportation needs - medical: None  . Transportation needs - non-medical: None  Occupational History  . 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 Smoking status: Former Smoker    Packs/day:  0.10    Years: 2.00    Pack years: 0.20    Last attempt to quit: 02/18/1994    Years since quitting: 23.0  . Smokeless tobacco: Never Used  Substance and Sexual Activity  . Alcohol use: Yes  . Drug use: No  . Sexual activity: None  Other Topics Concern  . None  Social History Narrative  . None   Allergies  Allergen Reactions  . Levaquin [Levofloxacin In D5w] Other (See Comments)  . Ace Inhibitors Cough  . Crestor [Rosuvastatin]     Sweet craving  . Lunesta [Eszopiclone]     Bitter taste  . Zocor [Simvastatin]     Memory problem   Family History  Problem Relation Age of Onset  . Heart attack Father   . Heart disease Unknown   . Diabetes Unknown   . Hypothyroidism Unknown      Past medical history, social, surgical and family history all reviewed in electronic medical record.  No pertanent information unless stated regarding to the chief complaint.   Review of Systems:Review of systems updated and as accurate as of 03/04/17  No headache, visual changes, nausea, vomiting, diarrhea, constipation, dizziness, abdominal pain, skin rash, fevers, chills, night sweats, weight loss, swollen lymph nodes, body aches, joint swelling, chest pain, shortness of breath, mood changes.  Positive muscle aches  Objective  Blood pressure 140/84, pulse 83, height 6\' 2"  (1.88 m), weight 255 lb (115.7 kg), SpO2 95 %. Systems examined below as of 03/04/17   General: No apparent distress alert and oriented x3 mood and affect normal, dressed appropriately.  HEENT: Pupils equal, extraocular movements intact  Respiratory: Patient's speak in full sentences and does not appear short of breath  Cardiovascular: No lower extremity edema, non tender, no erythema  Skin: Warm dry intact with no signs of infection or rash on extremities or on axial skeleton.  Abdomen: Soft nontender  Neuro: Cranial nerves II through XII are intact, neurovascularly intact in all extremities with 2+ DTRs and 2+ pulses.    Lymph: No lymphadenopathy of posterior or anterior cervical chain or axillae bilaterally.  Gait normal with good balance and coordination.  MSK:  Non tender with full range of motion and good stability and symmetric strength and tone of  elbows, wrist, hip, knee and ankles bilaterally.  Shoulder: Right Inspection reveals no abnormalities, atrophy or asymmetry. Palpation is normal with no tenderness over AC joint or bicipital groove. ROM is full in all planes. Rotator cuff strength normal throughout. Severe impingement with Neer's and Hawkins Speeds and Yergason's tests normal. Severe tenderness to palpation over the anterior aspect of the shoulder.  Positive O'Brien's with weakness noted on exam to Normal scapular function observed. No painful arc and no drop arm sign. Mild positive apprehension sign Contralateral shoulder unremarkable  Impression and Recommendations:     This case required medical decision making of moderate complexity.      Note: This dictation was prepared with Dragon dictation along with smaller phrase technology. Any transcriptional errors that result from this process are unintentional.

## 2017-03-04 NOTE — Patient Instructions (Signed)
Good to see you  Jason Hogan is your friend.  Prednisone daily for 5 days I hope I can get you in with [address] [facility] soon  [phone] [address] (I think) [facility] I am here if you have questions.

## 2017-03-04 NOTE — Assessment & Plan Note (Signed)
Based on patient's presentation I believe that patient would likely have a rotator cuff tear that is going to be partial, bicep tendinosis with likely intrasubstance tearing as well as an anterior labral tear.  Patient because of his pacemaker is unable to have an MRI.  Do not feel we would gain any more information with a CT arthrogram.  Feel that the ultrasound has diagnosed well enough.  Patient is failed all conservative therapy and would like to refer patient to orthopedic surgery for further evaluation patient is in agreement with the plan.  Has tramadol for breakthrough pain.  We discussed prednisone for a short course.  Follow-up again as needed otherwise.

## 2017-03-08 NOTE — Progress Notes (Signed)
Patient ID: Jason Hogan, male   DOB: 02-11-1976, 42 y.o.   MRN: [id number]    Jason Hogan is seen todya for F/U of CAD with large Anterior M.I. 2011  and collateralized silent distal RCA occlusion. Initially acute MI care was with Dr Terrence Dupont who was the on call STEMI doctor  AICD implant complicated by hematoma  Has single chamber device with no counter so unable to assess palpitations    Had care at [facility] for a while and re established June 2017   Seen by Dr [name] 11/12/16  after AICD d/c;s  Found to have appropriate ATP and shock for VT. Has been seeing [name] at [facility] Less than 2% PVC burden last EP visit October 2018 with mexiletine   Also seen by ENT there 09/2014 for epistaxis with negative evaluation.  Seen by urology for Low T on clomid but caused irritability and then anastrazole  With improvement in fatigue and libido  Did not tolerate aldactone post m.i. with excessive fatiigue and also pruritis but on it now  Seen by Dr [name] and [name] for polyarthragia and hematuria.  Noted labs done 07/17/16 with ESR 88 and positive ANA UA with large Hb has had stones in past Cr stable .20  Malaise started beginning of May was Rx with levaquin but felt worse  Reviewed CT abdomen 07/05/16 normal no etiology for hematuria   He feels much better on prednisone taper demedex doing better job diuresing  Needs to be re evaluated by rheumatology for persistent elevation in ESR and  polymyalgia  Has right shoulder issue Can't have MRI to see if rotator cuff or labrum Seeing Dr Tamera Punt  Lab Results  Component Value Date   WBC 4.2 07/17/2016   HGB 13.5 07/17/2016   HCT 41.0 07/17/2016   PLT 303.0 07/17/2016   GLUCOSE 73 02/17/2017   CHOL 167 10/24/2015   TRIG 237.0 (H) 10/24/2015   HDL 40.20 10/24/2015   LDLDIRECT 101.0 10/24/2015   LDLCALC 163 (H) 11/17/2014   ALT 28 07/17/2016   AST 42 (H) 07/17/2016   NA 140 02/17/2017   K 4.7 02/17/2017   CL 98 02/17/2017   CREATININE  1.19 02/17/2017   BUN 21 02/17/2017   CO2 25 02/17/2017   TSH 1.13 10/24/2015   PSA 0.26 10/24/2015   INR 1.1 (H) 08/01/2016   HGBA1C 5.0 07/04/2016    Echo 05/16/15 reviewed EF 30-35% Cath 05/17/15  Stable disease   Prox RCA lesion, 25% stenosed.  Mid RCA lesion, 99% stenosed.  Dist RCA lesion, 100% stenosed.  Mid Cx to Dist Cx lesion, 40% stenosed.  Prox LAD to Mid LAD lesion, 10% stenosed. The lesion was previously treated with a stent (unknown type).  Dist LAD lesion, 30% stenosed.  No LAD stent restenosis No progression of moderate circumflex disease Progression of mid RCA disease but with known CTO distal RCA And left to right collaterals no indication to intervene on mid vessel That would only supply small RV branch Patient not having chest pain Indication for cath was multiple AICD d/c's   Stable ischemic DCM  ROS: Denies fever, malais, weight loss, blurry vision, decreased visual acuity, cough, sputum, SOB, hemoptysis, pleuritic pain, palpitaitons, heartburn, abdominal pain, melena, lower extremity edema, claudication, or rash.  All other systems reviewed and negative  General: Filed Weights   03/10/17 0944  Weight: 244 lb 12.8 oz (111 kg)   Affect appropriate Healthy:  appears stated age 43: normal Neck supple with no adenopathy  JVP normal no bruits no thyromegaly Lungs clear with no wheezing and good diaphragmatic motion Heart:  S1/S2 no murmur, no rub, gallop or click PMI enlarged AICD under left clavicle  Abdomen: benighn, BS positve, no tenderness, no AAA no bruit.  No HSM or HJR Distal pulses intact with no bruits No edema Neuro non-focal Skin warm and dry No muscular weakness   Current Outpatient Medications  Medication Sig Dispense Refill  . allopurinol (ZYLOPRIM) 300 MG tablet TAKE 1 TABLET BY MOUTH EVERY DAY 90 tablet 0  . aspirin 81 MG tablet Take 81 mg by mouth daily.     . budesonide-formoterol (SYMBICORT) 80-4.5 MCG/ACT inhaler  Inhale 2 puffs into the lungs 2 (two) times daily as needed (wheezing). 1 Inhaler 11  . carvedilol (COREG) 6.25 MG tablet TAKE 1 TABLET BY MOUTH TWICE A DAY WITH A MEAL 60 tablet 11  . cetirizine (ZYRTEC) 10 MG tablet Take 10 mg by mouth daily as needed for allergies.     . Coenzyme Q10 (CO Q 10) 100 MG CAPS Take 1 capsule by mouth daily.    [name] COLCRYS 0.6 MG tablet TAKE 1 TABLET BY MOUTH TWICE A DAY 60 tablet 0  . ENTRESTO 24-26 MG TAKE 1 TABLET BY MOUTH TWICE A DAY 180 tablet 3  . metolazone (ZAROXOLYN) 5 MG tablet TAKE 1 TABLET (5 MG TOTAL) BY MOUTH DAILY AS NEEDED (FOR EDEMA). 30 tablet 9  . mexiletine (MEXITIL) 200 MG capsule Take 1 capsule (200 mg total) by mouth 2 (two) times daily. 60 capsule 6  . mometasone (NASONEX) 50 MCG/ACT nasal spray Place 2 sprays into the nose daily as needed (allergies).    . mometasone-formoterol (DULERA) 200-5 MCG/ACT AERO Inhale 2 puffs into the lungs 2 (two) times daily. 3 Inhaler 3  . potassium chloride SA (K-DUR,KLOR-CON) 20 MEQ tablet Take 2 tablets (40 mEq total) by mouth daily. 60 tablet 11  . predniSONE (DELTASONE) 50 MG tablet Take 1 tablet (50 mg total) by mouth daily. 5 tablet 0  . PROAIR HFA 108 (90 Base) MCG/ACT inhaler INHALE 2 PUFFS EVERY 4 HRS AS NEEDED *ONLY IF CANT CATCH BREATH* PHYSICAL DUE 8.5 Inhaler 0  . tiZANidine (ZANAFLEX) 4 MG tablet TAKE 1 TABLET BY MOUTH EVERY 6 HOURS AS NEEDED SPASMS OF THE MUSCLE 60 tablet 0  . torsemide (DEMADEX) 20 MG tablet Take 2 tablets (40 mg total) 2 (two) times daily by mouth. 120 tablet 6  . traMADol (ULTRAM) 50 MG tablet Take 1 tablet (50 mg total) every 8 (eight) hours as needed by mouth. 90 tablet 1  . Vitamin D, Ergocalciferol, (DRISDOL) 50000 units CAPS capsule Take 1 capsule (50,000 Units total) by mouth every 7 (seven) days. 12 capsule 0  . EFFIENT 10 MG TABS tablet TAKE 1 TABLET BY MOUTH EVERY DAY (Patient not taking: Reported on 03/10/2017) 30 tablet 0  . escitalopram (LEXAPRO) 10 MG tablet Take 1  tablet (10 mg total) daily by mouth. (Patient not taking: Reported on 03/10/2017) 90 tablet 3  . spironolactone (ALDACTONE) 25 MG tablet Take 0.5 tablets (12.5 mg total) by mouth daily. (Patient not taking: Reported on 03/10/2017)     No current facility-administered medications for this visit.     Allergies  Levaquin [levofloxacin in d5w]; Ace inhibitors; Crestor [rosuvastatin]; Lunesta [eszopiclone]; and Zocor [simvastatin]  Electrocardiogram:  05/09/15  NSR rate 74  biatrial enlargement old anterior MI   QT 390    Assessment and Plan VT/AICD: St Jude single lead. 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 Related to autoimmune phenomenon he has f/u with his urologist in [facility]

## 2017-03-10 ENCOUNTER — Encounter: Payer: Self-pay | Admitting: Cardiovascular Disease

## 2017-03-10 ENCOUNTER — Ambulatory Visit (INDEPENDENT_AMBULATORY_CARE_PROVIDER_SITE_OTHER): Payer: Self-pay | Admitting: Cardiovascular Disease

## 2017-03-10 VITALS — BP 122/88 | HR 82 | Ht 74.0 in | Wt 244.8 lb

## 2017-03-10 DIAGNOSIS — I472 Ventricular tachycardia, unspecified: Secondary | ICD-10-CM

## 2017-03-10 DIAGNOSIS — E7800 Pure hypercholesterolemia, unspecified: Secondary | ICD-10-CM

## 2017-03-10 DIAGNOSIS — I5022 Chronic systolic (congestive) heart failure: Secondary | ICD-10-CM

## 2017-03-10 DIAGNOSIS — Z95810 Presence of automatic (implantable) cardiac defibrillator: Secondary | ICD-10-CM

## 2017-03-10 DIAGNOSIS — I2510 Atherosclerotic heart disease of native coronary artery without angina pectoris: Secondary | ICD-10-CM

## 2017-03-10 NOTE — Patient Instructions (Signed)

## 2017-03-17 ENCOUNTER — Ambulatory Visit (INDEPENDENT_AMBULATORY_CARE_PROVIDER_SITE_OTHER): Payer: Self-pay | Admitting: Internal Medicine

## 2017-03-17 ENCOUNTER — Encounter: Payer: Self-pay | Admitting: Internal Medicine

## 2017-03-17 DIAGNOSIS — I5022 Chronic systolic (congestive) heart failure: Secondary | ICD-10-CM

## 2017-03-17 NOTE — Progress Notes (Signed)
Patient Care Team: Corwin Levins, MD as PCP - General Wendall Stade, MD (Cardiology)   HPI  Jason Hogan is a 42 y.o. male Seen in follow-up for ventricular tachycardia with a history of ICD implantation and previous appropriate therapy;  he has a history of ischemic heart disease.      DATE TEST    3/17 Echo    EF 30-35 %   7/17 Echo    EF 40-45 %         Date Cr K  12/18 1.19 4.7          Device History: ICD implanted 2011 for primary prevention  History of appropriate therapy: Yes   3/17 VT-History of AAD therapy: Yes  ATP and shocks   He has been feeling reasonably well on Entresto.  He is struggling to afford it.  Volume issues have been much improved on torsemide.  He has mild dyspnea on exertion but no edema.  No orthopnea or nocturnal dyspnea  No interval ventricular tachycardia  Tolerating mexilitene   Past Medical History:  Diagnosis Date  . Allergic rhinitis, cause unspecified 10/03/2011  . Anxiety 10/03/2011  . Asthma   . ASTHMA 09/20/2006   Qualifier: Diagnosis of  By: Jonny Ruiz MD, Len Blalock   . 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 Bacterial pneumonia   . CAD (coronary artery disease)   . CARDIOMYOPATHY, ISCHEMIC 10/20/2009   Qualifier: Diagnosis of  By: Graciela Husbands, MD, Ruthann Cancer Ty Hilts   . Cholelithiasis   . CHOLELITHIASIS 06/22/2007   Qualifier: Diagnosis of  By: Maris Berger   . Chronic systolic heart failure (HCC) 09/18/2009   Qualifier: Diagnosis of  By: Kem Parkinson    . Depression   . DEPRESSION 06/22/2007   Qualifier: Diagnosis of  By: Maris Berger   . Elevated blood pressure   . Heart failure   . Hypercholesteremia   . HYPERCHOLESTEROLEMIA 09/18/2009   Qualifier: Diagnosis of  By: Kem Parkinson    . Hyperlipidemia   . Impaired glucose tolerance 10/05/2012  . Insomnia   . INSOMNIA-SLEEP DISORDER-UNSPEC 06/22/2007   Qualifier: Diagnosis of  By: Jonny Ruiz MD, Len Blalock   . 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 Migraine headache   . MIGRAINE HEADACHE 09/18/2009   Qualifier: Diagnosis of  By: Kem Parkinson    . MYOCARDIAL INFARCTION 09/18/2009   Qualifier: Diagnosis of  By: Kem Parkinson    . Myocardial infarction (HCC)   . Neck pain    right  . Renal calculus   . RENAL CALCULUS, HX OF 09/20/2006   Qualifier: Diagnosis of  By: Jonny Ruiz MD, Len Blalock   . Renal insufficiency   . RENAL INSUFFICIENCY 09/18/2009   Qualifier: Diagnosis of  By: Kem Parkinson    . Respiratory failure (HCC)   . Respiratory failure (HCC)   . Rhabdomyolysis   . Rhabdomyolysis 09/18/2009   Qualifier: Diagnosis of  By: Kem Parkinson    . URI (upper respiratory infection)     Past Surgical History:  Procedure Laterality Date  . CARDIAC CATHETERIZATION N/A 05/17/2015   Procedure: Left Heart Cath and Coronary Angiography;  Surgeon: Wendall Stade, MD;  Location: [facility] INVASIVE CV LAB;  Service: Cardiovascular;  Laterality: N/A;    Current Outpatient Medications  Medication Sig Dispense Refill  . allopurinol (ZYLOPRIM) 300 MG tablet TAKE 1 TABLET BY MOUTH EVERY DAY  90 tablet 0  . aspirin 81 MG tablet Take 81 mg by mouth daily.     . budesonide-formoterol (SYMBICORT) 80-4.5 MCG/ACT inhaler Inhale 2 puffs into the lungs 2 (two) times daily as needed (wheezing). 1 Inhaler 11  . carvedilol (COREG) 6.25 MG tablet TAKE 1 TABLET BY MOUTH TWICE A DAY WITH A MEAL 60 tablet 11  . cetirizine (ZYRTEC) 10 MG tablet Take 10 mg by mouth daily as needed for allergies.     . Coenzyme Q10 (CO Q 10) 100 MG CAPS Take 1 capsule by mouth daily.    [name] COLCRYS 0.6 MG tablet TAKE 1 TABLET BY MOUTH TWICE A DAY 60 tablet 0  . EFFIENT 10 MG TABS tablet TAKE 1 TABLET BY MOUTH EVERY DAY 30 tablet 0  . ENTRESTO 24-26 MG TAKE 1 TABLET BY MOUTH TWICE A DAY 180 tablet 3  . escitalopram (LEXAPRO) 10 MG tablet Take 1 tablet (10 mg total) daily by mouth. 90 tablet 3  .  metolazone (ZAROXOLYN) 5 MG tablet TAKE 1 TABLET (5 MG TOTAL) BY MOUTH DAILY AS NEEDED (FOR EDEMA). 30 tablet 9  . mexiletine (MEXITIL) 200 MG capsule Take 1 capsule (200 mg total) by mouth 2 (two) times daily. 60 capsule 6  . mometasone (NASONEX) 50 MCG/ACT nasal spray Place 2 sprays into the nose daily as needed (allergies).    . mometasone-formoterol (DULERA) 200-5 MCG/ACT AERO Inhale 2 puffs into the lungs 2 (two) times daily. 3 Inhaler 3  . potassium chloride SA (K-DUR,KLOR-CON) 20 MEQ tablet Take 2 tablets (40 mEq total) by mouth daily. 60 tablet 11  . predniSONE (DELTASONE) 50 MG tablet Take 1 tablet (50 mg total) by mouth daily. 5 tablet 0  . PROAIR HFA 108 (90 Base) MCG/ACT inhaler INHALE 2 PUFFS EVERY 4 HRS AS NEEDED *ONLY IF CANT CATCH BREATH* PHYSICAL DUE 8.5 Inhaler 0  . spironolactone (ALDACTONE) 25 MG tablet Take 0.5 tablets (12.5 mg total) by mouth daily.    [name] tiZANidine (ZANAFLEX) 4 MG tablet TAKE 1 TABLET BY MOUTH EVERY 6 HOURS AS NEEDED SPASMS OF THE MUSCLE 60 tablet 0  . torsemide (DEMADEX) 20 MG tablet Take 2 tablets (40 mg total) 2 (two) times daily by mouth. 120 tablet 6  . traMADol (ULTRAM) 50 MG tablet Take 1 tablet (50 mg total) every 8 (eight) hours as needed by mouth. 90 tablet 1  . Vitamin D, Ergocalciferol, (DRISDOL) 50000 units CAPS capsule Take 1 capsule (50,000 Units total) by mouth every 7 (seven) days. 12 capsule 0   No current facility-administered medications for this visit.     Allergies  Allergen Reactions  . Levaquin [Levofloxacin In D5w] Other (See Comments)  . Ace Inhibitors Cough  . Crestor [Rosuvastatin]     Sweet craving  . Lunesta [Eszopiclone]     Bitter taste  . Zocor [Simvastatin]     Memory problem      Review of Systems negative except from HPI and PMH  Physical Exam BP 132/78   Pulse 86   Ht 6\' 2"  (1.88 m)   Wt 243 lb 9.6 oz (110.5 kg)   BMI 31.28 kg/m  Well developed and nourished in no acute distress HENT normal Neck  supple with JVP-flat Clear Device pocket well healed; without hematoma or erythema.  There is no tethering Regular rate and rhythm, no murmurs or gallops Abd-soft with active BS No Clubbing cyanosis edema Skin-warm and dry A & Oriented  Grossly normal sensory and motor function  ECG: Sinus Rhythm  @83  15/17/46 Anterior wall MI    Assessment and  Plan  Ventricular tachycardia  Ischemic Cardiomyopathy  Chronic systolic heart failure  Implantable defibrillator-Saint Jude  Overall he is done very well.  There is been no interval ventricular tachycardia.  He has been better on the [facility].  Difficulty has been affording it.  We have filled out [employer] paperwork  Euvolemic continue current meds  Without symptoms of ischemia

## 2017-03-17 NOTE — Patient Instructions (Signed)
Medication Instructions:  Your physician recommends that you continue on your current medications as directed. Please refer to the Current Medication list given to you today.  Labwork: None ordered.  Testing/Procedures: None ordered.  Follow-Up: Your physician wants you to follow-up in: In one year with Dr Graciela Husbands. Please call our office to schedule the follow-up appointment.    Remote monitoring is used to monitor your ICD from home. This monitoring reduces the number of office visits required to check your device to one time per year. It allows [place] to keep an eye on the functioning of your device to ensure it is working properly. You are scheduled for a device check from home on 05/08/2017. You may send your transmission at any time that day. If you have a wireless device, the transmission will be sent automatically. After your physician reviews your transmission, you will receive a postcard with your next transmission date.    Any Other Special Instructions Will Be Listed Below (If Applicable).     If you need a refill on your cardiac medications before your next appointment, please call your pharmacy.

## 2017-03-18 ENCOUNTER — Other Ambulatory Visit: Payer: Self-pay | Admitting: Family Medicine

## 2017-03-18 ENCOUNTER — Other Ambulatory Visit: Payer: Self-pay | Admitting: Internal Medicine

## 2017-03-18 ENCOUNTER — Telehealth: Payer: Self-pay | Admitting: *Deleted

## 2017-03-18 LAB — CUP PACEART INCLINIC DEVICE CHECK
Brady Statistic RV Percent Paced: 0 %
HIGH POWER IMPEDANCE MEASURED VALUE: 50.0037
Implantable Lead Implant Date: 20111010
Lead Channel Pacing Threshold Amplitude: 1.75 V
Lead Channel Pacing Threshold Amplitude: 1.75 V
Lead Channel Pacing Threshold Pulse Width: 1 ms
Lead Channel Pacing Threshold Pulse Width: 1 ms
Lead Channel Sensing Intrinsic Amplitude: 11.9 mV
Lead Channel Setting Pacing Amplitude: 4 V
Lead Channel Setting Pacing Pulse Width: 1 ms
MDC IDC LEAD LOCATION: 753860
MDC IDC MSMT BATTERY REMAINING LONGEVITY: 42 mo
MDC IDC MSMT LEADCHNL RV IMPEDANCE VALUE: 487.5 Ohm
MDC IDC PG IMPLANT DT: 20111010
MDC IDC SESS DTM: 20190128213632
MDC IDC SET LEADCHNL RV SENSING SENSITIVITY: 0.5 mV
Pulse Gen Serial Number: 615318

## 2017-03-18 NOTE — Telephone Encounter (Signed)
Per Lorren RN, she has the samples and will place at front desk for Pt to pick up.  Also states she has called Pt to notify samples ready for pick up.  No further action needed.

## 2017-03-18 NOTE — Telephone Encounter (Signed)
-----   Message from Wiliam Ke, RN sent at 03/18/2017  8:40 AM EST ----- Regarding: Entresto samples This is the gentleman that Dr. Graciela Husbands wanted entresto samples for.  The samples are in [place].  Please bring some back to [address] office for pt.  Call pt to pick up.  Pt will drop off assistance letter when he picks up samples.  Just a reminder, Boneta Lucks

## 2017-03-18 NOTE — Telephone Encounter (Signed)
Entresto 24/26 mg samples pulled for the patient. Lot: [id number] Exp: 9/20 # 1 box  Dr. Graciela Husbands was given samples in the [place] office to take back to [place]. I have not called the patient.

## 2017-04-03 ENCOUNTER — Encounter: Payer: Self-pay | Admitting: Internal Medicine

## 2017-04-03 MED ORDER — ATORVASTATIN CALCIUM 20 MG PO TABS: 20.0000 mg | ORAL_TABLET | Freq: Every day | ORAL | 3 refills | Status: DC

## 2017-04-03 NOTE — Telephone Encounter (Signed)
Ok for restart lipitor

## 2017-04-09 ENCOUNTER — Encounter: Payer: Self-pay | Admitting: Internal Medicine

## 2017-04-10 ENCOUNTER — Telehealth: Payer: Self-pay | Admitting: *Deleted

## 2017-04-10 NOTE — Telephone Encounter (Signed)
Placed form for ENTRESTO patient assistance at Dr Koren Bound pod "B" for signature, also informed his nurse Oretha Milch, RN.

## 2017-04-15 ENCOUNTER — Encounter: Payer: Self-pay | Admitting: Nurse Practitioner

## 2017-04-15 ENCOUNTER — Ambulatory Visit (INDEPENDENT_AMBULATORY_CARE_PROVIDER_SITE_OTHER): Payer: Self-pay

## 2017-04-15 ENCOUNTER — Ambulatory Visit: Payer: Self-pay | Admitting: Nurse Practitioner

## 2017-04-15 VITALS — BP 130/84 | HR 106 | Temp 98.6°F | Ht 74.0 in | Wt 240.0 lb

## 2017-04-15 DIAGNOSIS — R509 Fever, unspecified: Secondary | ICD-10-CM

## 2017-04-15 DIAGNOSIS — R5381 Other malaise: Secondary | ICD-10-CM

## 2017-04-15 DIAGNOSIS — R5383 Other fatigue: Secondary | ICD-10-CM

## 2017-04-15 DIAGNOSIS — J4521 Mild intermittent asthma with (acute) exacerbation: Secondary | ICD-10-CM

## 2017-04-15 DIAGNOSIS — J0140 Acute pansinusitis, unspecified: Secondary | ICD-10-CM

## 2017-04-15 MED ORDER — IPRATROPIUM-ALBUTEROL 0.5-2.5 (3) MG/3ML IN SOLN
3.0000 mL | Freq: Four times a day (QID) | RESPIRATORY_TRACT | Status: DC
  Administered 2017-04-15: 3 mL via RESPIRATORY_TRACT

## 2017-04-15 MED ORDER — FLUTICASONE PROPIONATE 50 MCG/ACT NA SUSP: 2.0000 | Freq: Every day | NASAL | 0 refills | Status: DC

## 2017-04-15 MED ORDER — HYDROCODONE-HOMATROPINE 5-1.5 MG/5ML PO SYRP: 5.0000 mL | ORAL_SOLUTION | Freq: Four times a day (QID) | ORAL | 0 refills | Status: DC | PRN

## 2017-04-15 MED ORDER — ALBUTEROL SULFATE HFA 108 (90 BASE) MCG/ACT IN AERS: 1.0000 | INHALATION_SPRAY | Freq: Four times a day (QID) | RESPIRATORY_TRACT | 0 refills | Status: DC | PRN

## 2017-04-15 MED ORDER — AZITHROMYCIN 250 MG PO TABS: 250.0000 mg | ORAL_TABLET | Freq: Every day | ORAL | 0 refills | Status: DC

## 2017-04-15 MED ORDER — DM-GUAIFENESIN ER 30-600 MG PO TB12: 1.0000 | ORAL_TABLET | Freq: Two times a day (BID) | ORAL | 0 refills | Status: DC | PRN

## 2017-04-15 NOTE — Patient Instructions (Signed)
Go for CXR. Will call you with results.  Will send oral abx after review of CXR.

## 2017-04-15 NOTE — Progress Notes (Signed)
Subjective:  Patient ID: Jason Hogan, male    DOB: Sep 09, 1975  Age: 42 y.o. MRN: [id number]  CC: Cough (coughing yellow mucus,SOB,had fever,nose congestion/1-2 wk/ 2 wks a go dx with strep thorat,kid dx with flu)  Cough  This is a new problem. The current episode started 1 to 4 weeks ago. The problem has been gradually worsening. The problem occurs constantly. The cough is productive of purulent sputum. Associated symptoms include chest pain, chills, a fever, nasal congestion, postnasal drip, rhinorrhea, a sore throat, shortness of breath and wheezing. Pertinent negatives include no heartburn. The symptoms are aggravated by lying down and cold air. He has tried a beta-agonist inhaler and OTC cough suppressant for the symptoms. The treatment provided no relief. His past medical history is significant for asthma.  use of amoxicillin x 7days 2weeks ago to treat strep.  Outpatient Medications Prior to Visit  Medication Sig Dispense Refill  . allopurinol (ZYLOPRIM) 300 MG tablet TAKE 1 TABLET BY MOUTH EVERY DAY 90 tablet 0  . aspirin 81 MG tablet Take 81 mg by mouth daily.     [name] atorvastatin (LIPITOR) 20 MG tablet Take 1 tablet (20 mg total) by mouth daily. 90 tablet 3  . budesonide-formoterol (SYMBICORT) 80-4.5 MCG/ACT inhaler Inhale 2 puffs into the lungs 2 (two) times daily as needed (wheezing). 1 Inhaler 11  . carvedilol (COREG) 6.25 MG tablet TAKE 1 TABLET BY MOUTH TWICE A DAY WITH A MEAL 60 tablet 11  . cetirizine (ZYRTEC) 10 MG tablet Take 10 mg by mouth daily as needed for allergies.     . Coenzyme Q10 (CO Q 10) 100 MG CAPS Take 1 capsule by mouth daily.    [name] COLCRYS 0.6 MG tablet TAKE 1 TABLET BY MOUTH TWICE A DAY 60 tablet 0  . ENTRESTO 24-26 MG TAKE 1 TABLET BY MOUTH TWICE A DAY 180 tablet 3  . metolazone (ZAROXOLYN) 5 MG tablet TAKE 1 TABLET (5 MG TOTAL) BY MOUTH DAILY AS NEEDED (FOR EDEMA). 30 tablet 9  . mexiletine (MEXITIL) 200 MG capsule Take 1 capsule (200 mg total) by mouth 2  (two) times daily. 60 capsule 6  . potassium chloride SA (K-DUR,KLOR-CON) 20 MEQ tablet Take 2 tablets (40 mEq total) by mouth daily. 60 tablet 11  . predniSONE (DELTASONE) 50 MG tablet Take 1 tablet (50 mg total) by mouth daily. 5 tablet 0  . spironolactone (ALDACTONE) 25 MG tablet Take 0.5 tablets (12.5 mg total) by mouth daily.    [name] tiZANidine (ZANAFLEX) 4 MG tablet TAKE 1 TABLET BY MOUTH EVERY 6 HOURS AS NEEDED SPASMS OF THE MUSCLE 60 tablet 0  . torsemide (DEMADEX) 20 MG tablet Take 2 tablets (40 mg total) 2 (two) times daily by mouth. 120 tablet 6  . traMADol (ULTRAM) 50 MG tablet Take 1 tablet (50 mg total) every 8 (eight) hours as needed by mouth. 90 tablet 1  . Vitamin D, Ergocalciferol, (DRISDOL) 50000 units CAPS capsule Take 1 capsule (50,000 Units total) by mouth every 7 (seven) days. 12 capsule 0  . EFFIENT 10 MG TABS tablet TAKE 1 TABLET BY MOUTH EVERY DAY 30 tablet 0  . mometasone (NASONEX) 50 MCG/ACT nasal spray Place 2 sprays into the nose daily as needed (allergies).    . mometasone-formoterol (DULERA) 200-5 MCG/ACT AERO Inhale 2 puffs into the lungs 2 (two) times daily. 3 Inhaler 3  . PROAIR HFA 108 (90 Base) MCG/ACT inhaler INHALE 2 PUFFS EVERY 4 HRS AS NEEDED *ONLY IF CANT  CATCH BREATH* PHYSICAL DUE 8.5 Inhaler 0  . escitalopram (LEXAPRO) 10 MG tablet Take 1 tablet (10 mg total) daily by mouth. 90 tablet 3   No facility-administered medications prior to visit.     ROS See HPI  Objective:  BP 130/84   Pulse (!) 106   Temp 98.6 F (37 C)   Ht 6\' 2"  (1.88 m)   Wt 240 lb (108.9 kg)   SpO2 97%   BMI 30.81 kg/m   BP Readings from Last 3 Encounters:  04/15/17 130/84  03/17/17 132/78  03/10/17 122/88    Wt Readings from Last 3 Encounters:  04/15/17 240 lb (108.9 kg)  03/17/17 243 lb 9.6 oz (110.5 kg)  03/10/17 244 lb 12.8 oz (111 kg)    Physical Exam  Constitutional: He is oriented to person, place, and time. No distress.  HENT:  Right Ear: Tympanic  membrane, external ear and ear canal normal.  Left Ear: Tympanic membrane, external ear and ear canal normal.  Nose: Mucosal edema and rhinorrhea present. Right sinus exhibits maxillary sinus tenderness. Right sinus exhibits no frontal sinus tenderness. Left sinus exhibits maxillary sinus tenderness. Left sinus exhibits no frontal sinus tenderness.  Mouth/Throat: Uvula is midline. No trismus in the jaw. Posterior oropharyngeal erythema present. No oropharyngeal exudate.  Eyes: Conjunctivae and EOM are normal. Pupils are equal, round, and reactive to light.  Cardiovascular: Normal rate, regular rhythm and normal heart sounds.  Pulmonary/Chest: Effort normal. No respiratory distress. He has wheezes. He has rales.  Resolved wheezing post nebulizer treatment. Mild rales in lung bases.  Musculoskeletal: He exhibits no edema.  Neurological: He is alert and oriented to person, place, and time.  Skin: Skin is warm and dry.  Psychiatric: He has a normal mood and affect. His behavior is normal.  Vitals reviewed.   Lab Results  Component Value Date   WBC 4.2 07/17/2016   HGB 13.5 07/17/2016   HCT 41.0 07/17/2016   PLT 303.0 07/17/2016   GLUCOSE 73 02/17/2017   CHOL 167 10/24/2015   TRIG 237.0 (H) 10/24/2015   HDL 40.20 10/24/2015   LDLDIRECT 101.0 10/24/2015   LDLCALC 163 (H) 11/17/2014   ALT 28 07/17/2016   AST 42 (H) 07/17/2016   NA 140 02/17/2017   K 4.7 02/17/2017   CL 98 02/17/2017   CREATININE 1.19 02/17/2017   BUN 21 02/17/2017   CO2 25 02/17/2017   TSH 1.13 10/24/2015   PSA 0.26 10/24/2015   INR 1.1 (H) 08/01/2016   HGBA1C 5.0 07/04/2016    Dg Chest 2 View  Result Date: 01/02/2017 CLINICAL DATA:  Persistent cough and wheezing. EXAM: CHEST  2 VIEW COMPARISON:  07/04/2016 . FINDINGS: Cardiac pacer with lead tip over the right ventricle. Heart size normal. No focal infiltrate. No pleural effusion or pneumothorax. IMPRESSION: Cardiac pacer with lead tip over the right ventricle.  No acute cardiopulmonary disease. Chest is stable from 07/04/2016. Electronically Signed   By: Maisie Fus  Register   On: 01/02/2017 06:53    Assessment & Plan:   Olivia was seen today for cough.  Diagnoses and all orders for this visit:  Intermittent asthma with acute exacerbation, unspecified asthma severity -     ipratropium-albuterol (DUONEB) 0.5-2.5 (3) MG/3ML nebulizer solution 3 mL -     DG Chest 2 View; Future -     albuterol (PROAIR HFA) 108 (90 Base) MCG/ACT inhaler; Inhale 1-2 puffs into the lungs every 6 (six) hours as needed for wheezing or shortness of breath. -  HYDROcodone-homatropine (HYCODAN) 5-1.5 MG/5ML syrup; Take 5 mLs by mouth every 6 (six) hours as needed for cough. -     dextromethorphan-guaiFENesin (MUCINEX DM) 30-600 MG 12hr tablet; Take 1 tablet by mouth 2 (two) times daily as needed for cough. -     DG Chest 2 View -     azithromycin (ZITHROMAX Z-PAK) 250 MG tablet; Take 1 tablet (250 mg total) by mouth daily. Take 2tabs on first day, then 1tab once a day till complete  Fever, unspecified fever cause -     DG Chest 2 View; Future -     DG Chest 2 View -     azithromycin (ZITHROMAX Z-PAK) 250 MG tablet; Take 1 tablet (250 mg total) by mouth daily. Take 2tabs on first day, then 1tab once a day till complete  Malaise and fatigue -     DG Chest 2 View; Future -     DG Chest 2 View -     azithromycin (ZITHROMAX Z-PAK) 250 MG tablet; Take 1 tablet (250 mg total) by mouth daily. Take 2tabs on first day, then 1tab once a day till complete  Acute non-recurrent pansinusitis -     fluticasone (FLONASE) 50 MCG/ACT nasal spray; Place 2 sprays into both nostrils daily. -     azithromycin (ZITHROMAX Z-PAK) 250 MG tablet; Take 1 tablet (250 mg total) by mouth daily. Take 2tabs on first day, then 1tab once a day till complete   I have discontinued Jarren S. Bello "Bobby"'s EFFIENT, mometasone-formoterol, mometasone, and escitalopram. I have also changed his PROAIR HFA to  albuterol. Additionally, I am having him start on HYDROcodone-homatropine, dextromethorphan-guaiFENesin, fluticasone, and azithromycin. Lastly, I am having him maintain his aspirin, Co Q 10, cetirizine, budesonide-formoterol, carvedilol, metolazone, COLCRYS, potassium chloride SA, spironolactone, mexiletine, torsemide, traMADol, Vitamin D (Ergocalciferol), ENTRESTO, predniSONE, tiZANidine, allopurinol, and atorvastatin. We administered ipratropium-albuterol. We will continue to administer ipratropium-albuterol.  Meds ordered this encounter  Medications  . ipratropium-albuterol (DUONEB) 0.5-2.5 (3) MG/3ML nebulizer solution 3 mL  . albuterol (PROAIR HFA) 108 (90 Base) MCG/ACT inhaler    Sig: Inhale 1-2 puffs into the lungs every 6 (six) hours as needed for wheezing or shortness of breath.    Dispense:  1 Inhaler    Refill:  0    Order Specific Question:   Supervising Provider    Answer:   Dianne Dun [[id number]]  . HYDROcodone-homatropine (HYCODAN) 5-1.5 MG/5ML syrup    Sig: Take 5 mLs by mouth every 6 (six) hours as needed for cough.    Dispense:  60 mL    Refill:  0    Order Specific Question:   Supervising Provider    Answer:   Dianne Dun [[id number]]  . dextromethorphan-guaiFENesin (MUCINEX DM) 30-600 MG 12hr tablet    Sig: Take 1 tablet by mouth 2 (two) times daily as needed for cough.    Dispense:  14 tablet    Refill:  0    Order Specific Question:   Supervising Provider    Answer:   Dianne Dun [[id number]]  . fluticasone (FLONASE) 50 MCG/ACT nasal spray    Sig: Place 2 sprays into both nostrils daily.    Dispense:  16 g    Refill:  0    Order Specific Question:   Supervising Provider    Answer:   Dianne Dun [[id number]]  . azithromycin (ZITHROMAX Z-PAK) 250 MG tablet    Sig: Take 1 tablet (250 mg total) by mouth daily. Take 2tabs  on first day, then 1tab once a day till complete    Dispense:  6 tablet    Refill:  0    Order Specific Question:   Supervising Provider    Answer:   Dianne Dun [[id number]]    Follow-up: Return if symptoms worsen or fail to improve.  Alysia Penna, NP

## 2017-04-15 NOTE — Telephone Encounter (Signed)
Dr Graciela Husbands has signed the pts [facility] application and I have faxed it to [facility].

## 2017-04-17 ENCOUNTER — Encounter: Payer: Self-pay | Admitting: Nurse Practitioner

## 2017-04-17 DIAGNOSIS — J4521 Mild intermittent asthma with (acute) exacerbation: Secondary | ICD-10-CM

## 2017-04-17 MED ORDER — PREDNISONE 20 MG PO TABS: 40.0000 mg | ORAL_TABLET | Freq: Every day | ORAL | 0 refills | Status: DC

## 2017-04-17 NOTE — Telephone Encounter (Signed)
Please advise,

## 2017-04-21 ENCOUNTER — Other Ambulatory Visit: Payer: Self-pay | Admitting: Internal Medicine

## 2017-04-29 ENCOUNTER — Encounter (HOSPITAL_COMMUNITY): Payer: Self-pay | Admitting: Emergency Medicine

## 2017-04-29 ENCOUNTER — Emergency Department (HOSPITAL_COMMUNITY): Payer: Self-pay

## 2017-04-29 ENCOUNTER — Encounter: Payer: Self-pay | Admitting: Cardiovascular Disease

## 2017-04-29 ENCOUNTER — Emergency Department (HOSPITAL_COMMUNITY)
Admission: EM | Admit: 2017-04-29 | Discharge: 2017-04-29 | Disposition: A | Discharge status: Home / Self Care | Payer: Self-pay | Attending: Emergency Medicine | Admitting: Emergency Medicine

## 2017-04-29 ENCOUNTER — Other Ambulatory Visit: Payer: Self-pay

## 2017-04-29 ENCOUNTER — Encounter: Payer: Self-pay | Admitting: Internal Medicine

## 2017-04-29 ENCOUNTER — Telehealth: Payer: Self-pay | Admitting: Cardiovascular Disease

## 2017-04-29 DIAGNOSIS — J45909 Unspecified asthma, uncomplicated: Secondary | ICD-10-CM | POA: Insufficient documentation

## 2017-04-29 DIAGNOSIS — Z87891 Personal history of nicotine dependence: Secondary | ICD-10-CM | POA: Insufficient documentation

## 2017-04-29 DIAGNOSIS — Z7982 Long term (current) use of aspirin: Secondary | ICD-10-CM | POA: Insufficient documentation

## 2017-04-29 DIAGNOSIS — I2510 Atherosclerotic heart disease of native coronary artery without angina pectoris: Secondary | ICD-10-CM | POA: Insufficient documentation

## 2017-04-29 DIAGNOSIS — Z79899 Other long term (current) drug therapy: Secondary | ICD-10-CM | POA: Insufficient documentation

## 2017-04-29 DIAGNOSIS — I5022 Chronic systolic (congestive) heart failure: Secondary | ICD-10-CM | POA: Insufficient documentation

## 2017-04-29 DIAGNOSIS — R0789 Other chest pain: Secondary | ICD-10-CM | POA: Insufficient documentation

## 2017-04-29 LAB — BASIC METABOLIC PANEL
Anion gap: 12 (ref 5–15)
BUN: 23 mg/dL — AB (ref 6–20)
CALCIUM: 9.6 mg/dL (ref 8.9–10.3)
CHLORIDE: 97 mmol/L — AB (ref 101–111)
CO2: 29 mmol/L (ref 22–32)
CREATININE: 1.89 mg/dL — AB (ref 0.61–1.24)
GFR calc non Af Amer: 43 mL/min — ABNORMAL LOW (ref >60)
GFR, EST AFRICAN AMERICAN: 49 mL/min — AB (ref >60)
GLUCOSE: 81 mg/dL (ref 65–99)
Potassium: 3.6 mmol/L (ref 3.5–5.1)
Sodium: 138 mmol/L (ref 135–145)

## 2017-04-29 LAB — CBC
HEMATOCRIT: 46.6 % (ref 39.0–52.0)
Hemoglobin: 15 g/dL (ref 13.0–17.0)
MCH: 27.9 pg (ref 26.0–34.0)
MCHC: 32.2 g/dL (ref 30.0–36.0)
MCV: 86.6 fL (ref 78.0–100.0)
Platelets: 249 10*3/uL (ref 150–400)
RBC: 5.38 MIL/uL (ref 4.22–5.81)
RDW: 14.7 % (ref 11.5–15.5)
WBC: 9.8 10*3/uL (ref 4.0–10.5)

## 2017-04-29 LAB — I-STAT TROPONIN, ED
Troponin i, poc: 0 ng/mL (ref 0.00–0.08)
Troponin i, poc: 0.01 ng/mL (ref 0.00–0.08)

## 2017-04-29 LAB — D-DIMER, QUANTITATIVE: D-Dimer, Quant: 0.34 ug/mL-FEU (ref 0.00–0.50)

## 2017-04-29 LAB — BRAIN NATRIURETIC PEPTIDE: B NATRIURETIC PEPTIDE 5: 78.5 pg/mL (ref 0.0–100.0)

## 2017-04-29 LAB — MAGNESIUM: Magnesium: 1.7 mg/dL (ref 1.7–2.4)

## 2017-04-29 MED ORDER — OXYCODONE-ACETAMINOPHEN 5-325 MG PO TABS
1.0000 | ORAL_TABLET | Freq: Once | ORAL | Status: AC
  Administered 2017-04-29: 1 via ORAL
  Filled 2017-04-29: qty 1

## 2017-04-29 MED ORDER — ASPIRIN 81 MG PO CHEW
324.0000 mg | CHEWABLE_TABLET | Freq: Once | ORAL | Status: AC
  Administered 2017-04-29: 324 mg via ORAL
  Filled 2017-04-29: qty 4

## 2017-04-29 NOTE — Telephone Encounter (Signed)
Patient wife Jason Hogan) calling  Pt c/o BP issue: STAT if pt c/o blurred vision, one-sided weakness or slurred speech  1. What are your last 5 BP readings? 148/100  2. Are you having any other symptoms (ex. Dizziness, headache, blurred vision, passed out)? Blurred vision. 3. What is your BP issue? Running high

## 2017-04-29 NOTE — ED Triage Notes (Addendum)
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

## 2017-04-29 NOTE — ED Provider Notes (Signed)
[facility] EMERGENCY DEPARTMENT Provider Note   CSN: [id number] Arrival date & time: 04/29/17  1246     History   Chief Complaint Chief Complaint  Patient presents with  . Chest Pain    HPI Jason Hogan is a 42 y.o. male w/ h/o anterior MI in 2011, ventricular tachycardia, chronic systolic heart failure (EF 40s%), St Jude ICD, asthma, hypogonadism here for evaluation of chest pain onset 4 days ago. Pain is described as sharp and intermittent, worse with direct palpation, certain movements and movements of his upper extremities.Nonradiating, nonpleuritic, nonexertional. Pain started while he was yelling at his kids. He took Excedrin which improved the pain. Last night he noticed palpitations in the middle of the night, became concerned about his ICD. This morning he woke up and was nauseous and decided to come to the ED for concern of a heart attack. Reports shortness of breath with walking from the parking lot to the emergency department entrance but states that he was trying to walk really fast and coughing a lot which made his cough chest pain and breathing worse. Had the flu one month ago and symptoms are resolving however has a lingering cough, this is not worse. Denies history of DVT/PE, recent travel or immobilization, estrogen use, malignancy, leg or calf pain or swelling.  HPI  Past Medical History:  Diagnosis Date  . Allergic rhinitis, cause unspecified 10/03/2011  . Anxiety 10/03/2011  . Asthma   . ASTHMA 09/20/2006   Qualifier: Diagnosis of  By: Jonny Ruiz MD, Len Blalock   . 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 Bacterial pneumonia   . CAD (coronary artery disease)   . CARDIOMYOPATHY, ISCHEMIC 10/20/2009   Qualifier: Diagnosis of  By: Graciela Husbands, MD, Ruthann Cancer Ty Hilts   . Cholelithiasis   . CHOLELITHIASIS 06/22/2007   Qualifier: Diagnosis of  By: Maris Berger   .  Chronic systolic heart failure (HCC) 09/18/2009   Qualifier: Diagnosis of  By: Kem Parkinson    . Depression   . DEPRESSION 06/22/2007   Qualifier: Diagnosis of  By: Maris Berger   . Elevated blood pressure   . Heart failure   . Hypercholesteremia   . HYPERCHOLESTEROLEMIA 09/18/2009   Qualifier: Diagnosis of  By: Kem Parkinson    . Hyperlipidemia   . Impaired glucose tolerance 10/05/2012  . Insomnia   . INSOMNIA-SLEEP DISORDER-UNSPEC 06/22/2007   Qualifier: Diagnosis of  By: Jonny Ruiz MD, Len Blalock   . 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 Migraine headache   . MIGRAINE HEADACHE 09/18/2009   Qualifier: Diagnosis of  By: Kem Parkinson    . MYOCARDIAL INFARCTION 09/18/2009   Qualifier: Diagnosis of  By: Kem Parkinson    . Myocardial infarction (HCC)   . Neck pain    right  . Renal calculus   . RENAL CALCULUS, HX OF 09/20/2006   Qualifier: Diagnosis of  By: Jonny Ruiz MD, Len Blalock   . Renal insufficiency   . RENAL INSUFFICIENCY 09/18/2009   Qualifier: Diagnosis of  By: Kem Parkinson    . Respiratory failure (HCC)   . Respiratory failure (HCC)   . Rhabdomyolysis   . Rhabdomyolysis 09/18/2009   Qualifier: Diagnosis of  By: Kem Parkinson    . URI (upper respiratory infection)     Patient Active Problem List   Diagnosis Date Noted  . Labral tear of shoulder, right, initial encounter 03/04/2017  .  Acute bursitis of right shoulder 02/04/2017  . Elevated CPK 01/01/2017  . Elevated sed rate 01/01/2017  . Hematuria 07/17/2016  . Feels feverish 07/04/2016  . Chest pain 07/04/2016  . Dyspnea 07/04/2016  . Rib pain on right side 07/04/2016  . Abnormal urine 07/04/2016  . Low back pain 07/04/2016  . Muscle cramps 07/04/2016  . Strain of groin, left, initial encounter 06/19/2016  . Capsulitis of toe of right foot 03/08/2015  . Cervical radiculopathy at C8 03/08/2015  . Right shoulder pain 12/15/2014  . Polyarthralgia 11/17/2014  . Pes anserine bursitis 11/17/2014  . Epistaxis,  recurrent 08/18/2014  . Allergic rhinitis 08/18/2014  . Shoulder bursitis 08/01/2014  . Tachycardia 04/19/2014  . Abdominal pain 04/19/2014  . Acute respiratory infection 04/19/2014  . Gout 12/06/2013  . Insomnia 10/14/2013  . 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 Cough 06/14/2013  . Impaired glucose tolerance 10/05/2012  . Anxiety 10/03/2011  . Wellness examination 10/02/2011  . 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 CARDIOMYOPATHY, ISCHEMIC 10/20/2009  . HYPERCHOLESTEROLEMIA 09/18/2009  . MIGRAINE HEADACHE 09/18/2009  . MYOCARDIAL INFARCTION 09/18/2009  . Chronic systolic heart failure (HCC) 09/18/2009  . RENAL INSUFFICIENCY 09/18/2009  . Rhabdomyolysis 09/18/2009  . ELEVATED BLOOD PRESSURE WITHOUT DIAGNOSIS OF HYPERTENSION 12/19/2008  . DEPRESSION 06/22/2007  . Sleep disorder 06/22/2007  . Asthma 09/20/2006  . RENAL CALCULUS, HX OF 09/20/2006    Past Surgical History:  Procedure Laterality Date  . CARDIAC CATHETERIZATION N/A 05/17/2015   Procedure: Left Heart Cath and Coronary Angiography;  Surgeon: Wendall Stade, MD;  Location: [facility] INVASIVE CV LAB;  Service: Cardiovascular;  Laterality: N/A;       Home Medications    Prior to Admission medications   Medication Sig Start Date End Date Taking? Authorizing Provider  albuterol (PROAIR HFA) 108 (90 Base) MCG/ACT inhaler Inhale 1-2 puffs into the lungs every 6 (six) hours as needed for wheezing or shortness of breath. 04/15/17  Yes Nche, Bonna Gains, NP  allopurinol (ZYLOPRIM) 300 MG tablet TAKE 1 TABLET BY MOUTH EVERY DAY Patient taking differently: TAKE 1 TABLET (300 MG)BY MOUTH EVERY DAY 03/19/17  Yes Judi Saa, DO  aspirin EC 81 MG tablet Take 81 mg by mouth daily.   Yes [provider]  atorvastatin (LIPITOR) 20 MG tablet Take 1 tablet (20 mg total) by mouth daily. 04/03/17 04/03/18 Yes Corwin Levins, MD  budesonide-formoterol [facility]) 80-4.5 MCG/ACT inhaler Inhale 2 puffs into the  lungs 2 (two) times daily as needed (wheezing). Patient taking differently: Inhale 2 puffs into the lungs 2 (two) times daily as needed (wheezing/seasonal allergies).  10/24/15  Yes Corwin Levins, MD  carvedilol (COREG) 6.25 MG tablet TAKE 1 TABLET BY MOUTH TWICE A DAY WITH A MEAL Patient taking differently: TAKE 1 TABLET (6.25 MG) BY MOUTH TWICE A DAY WITH A MEAL 08/19/16  Yes Wendall Stade, MD  cetirizine (ZYRTEC) 10 MG tablet Take 10 mg by mouth daily as needed (seasonal allergies).    Yes [provider]  Coenzyme Q10 (CO Q 10) 100 MG CAPS Take 100 mg by mouth daily.    Yes [provider]  COLCRYS 0.6 MG tablet TAKE 1 TABLET BY MOUTH TWICE A DAY Patient taking differently: TAKE 1 TABLET (0.6) BY MOUTH TWICE A DAY AS NEEDED FOR GOUT ATTACKS 10/24/16  Yes Judi Saa, DO  ENTRESTO 24-26 MG TAKE 1 TABLET BY MOUTH TWICE A DAY 02/26/17  Yes Duke Salvia, MD  fluticasone (  FLONASE) 50 MCG/ACT nasal spray Place 2 sprays into both nostrils daily. Patient taking differently: Place 2 sprays into both nostrils daily as needed (seasonal allergies).  04/15/17  Yes Nche, Bonna Gains, NP  metolazone (ZAROXOLYN) 5 MG tablet TAKE 1 TABLET (5 MG TOTAL) BY MOUTH DAILY AS NEEDED (FOR EDEMA). 10/08/16  Yes Wendall Stade, MD  mexiletine (MEXITIL) 200 MG capsule Take 1 capsule (200 mg total) by mouth 2 (two) times daily. 12/05/16  Yes Newman Nip, NP  potassium chloride SA (K-DUR,KLOR-CON) 20 MEQ tablet Take 2 tablets (40 mEq total) by mouth daily. 11/12/16  Yes Duke Salvia, MD  spironolactone (ALDACTONE) 25 MG tablet Take 0.5 tablets (12.5 mg total) by mouth daily. Patient taking differently: Take 12.5 mg by mouth at bedtime.  11/12/16  Yes Duke Salvia, MD  tiZANidine (ZANAFLEX) 4 MG tablet TAKE 1 TABLET BY MOUTH EVERY 6 HOURS AS NEEDED SPASMS OF THE MUSCLE Patient taking differently: TAKE 1 TABLET (4 MG) BY MOUTH DAILY AT BEDTIME 04/22/17  Yes Corwin Levins, MD  torsemide (DEMADEX) 20  MG tablet Take 2 tablets (40 mg total) 2 (two) times daily by mouth. Patient taking differently: Take 40 mg by mouth See admin instructions. Take 2 tablets (40 mg) by mouth twice daily - morning and mid afternoon 12/23/16  Yes Wendall Stade, MD  dextromethorphan-guaiFENesin [facility] DM) 30-600 MG 12hr tablet Take 1 tablet by mouth 2 (two) times daily as needed for cough. Patient not taking: Reported on 04/29/2017 04/15/17   Nche, Bonna Gains, NP  HYDROcodone-homatropine [facility]) 5-1.5 MG/5ML syrup Take 5 mLs by mouth every 6 (six) hours as needed for cough. Patient not taking: Reported on 04/29/2017 04/15/17   Nche, Bonna Gains, NP  traMADol (ULTRAM) 50 MG tablet Take 1 tablet (50 mg total) every 8 (eight) hours as needed by mouth. Patient not taking: Reported on 04/29/2017 01/01/17   Corwin Levins, MD  Vitamin D, Ergocalciferol, (DRISDOL) 50000 units CAPS capsule Take 1 capsule (50,000 Units total) by mouth every 7 (seven) days. Patient not taking: Reported on 04/29/2017 02/04/17   Judi Saa, DO    Family History Family History  Problem Relation Age of Onset  . Heart attack Father   . Heart disease Unknown   . Diabetes Unknown   . Hypothyroidism Unknown     Social History Social History   Tobacco Use  . Smoking status: Former Smoker    Packs/day: 0.10    Years: 2.00    Pack years: 0.20    Last attempt to quit: 02/18/1994    Years since quitting: 23.2  . Smokeless tobacco: Never Used  Substance Use Topics  . Alcohol use: Yes  . Drug use: No     Allergies   Levaquin [levofloxacin]; Ace inhibitors; Crestor [rosuvastatin]; Lunesta [eszopiclone]; and Zocor [simvastatin]   Review of Systems Review of Systems  Respiratory: Positive for cough and shortness of breath.   Cardiovascular: Positive for chest pain and palpitations.  Gastrointestinal: Positive for nausea.  All other systems reviewed and are negative.    Physical Exam Updated Vital Signs BP (!) 128/98    Pulse 74   Temp 98.1 F (36.7 C) (Oral)   Resp (!) 30   Ht 6\' 2"  (1.88 m)   Wt 108.9 kg (240 lb)   SpO2 100%   BMI 30.81 kg/m   Physical Exam  Constitutional: He appears well-developed and well-nourished.  NAD. Non toxic.   HENT:  Head: Normocephalic and  atraumatic.  Nose: Nose normal.  Moist mucous membranes. Tonsils and oropharynx normal  Eyes: Conjunctivae, EOM and lids are normal.  Neck: Trachea normal and normal range of motion.  Neck is supple Trachea midline No cervical adenopathy  Cardiovascular: Normal rate, regular rhythm, S1 normal, S2 normal and normal heart sounds.  Pulses:      Carotid pulses are 2+ on the right side, and 2+ on the left side.      Radial pulses are 2+ on the right side, and 2+ on the left side.       Dorsalis pedis pulses are 2+ on the right side, and 2+ on the left side.  RRR. No orthopnea. No LE edema or calf tenderness.   Pulmonary/Chest: Effort normal and breath sounds normal. No respiratory distress. He has no decreased breath sounds. He has no rhonchi. He exhibits tenderness.  Reproducible chest wall tenderness. CP reproducible with AROM of upper extremities. No rales or wheezing.    Abdominal: Soft. Bowel sounds are normal. There is no tenderness.  No epigastric tenderness. No distention.   Neurological: He is alert. GCS eye subscore is 4. GCS verbal subscore is 5. GCS motor subscore is 6.  Skin: Skin is warm and dry. Capillary refill takes less than 2 seconds.  No rash to chest wall  Psychiatric: He has a normal mood and affect. His speech is normal and behavior is normal. Judgment and thought content normal. Cognition and memory are normal.     ED Treatments / Results  Labs (all labs ordered are listed, but only abnormal results are displayed) Labs Reviewed  BASIC METABOLIC PANEL - Abnormal; Notable for the following components:      Result Value   Chloride 97 (*)    BUN 23 (*)    Creatinine, Ser 1.89 (*)    GFR calc non Af  Amer 43 (*)    GFR calc Af Amer 49 (*)    All other components within normal limits  CBC  MAGNESIUM  BRAIN NATRIURETIC PEPTIDE  D-DIMER, QUANTITATIVE (NOT AT [facility])  CBG MONITORING, ED  I-STAT TROPONIN, ED  I-STAT TROPONIN, ED  I-STAT TROPONIN, ED  I-STAT TROPONIN, ED    EKG  EKG Interpretation  Date/Time:  Tuesday April 29 2017 12:56:41 EDT Ventricular Rate:  81 PR Interval:  180 QRS Duration: 102 QT Interval:  366 QTC Calculation: 425 R Axis:   87 Text Interpretation:  Normal sinus rhythm Possible Left atrial enlargement Anteroseptal infarct , age undetermined Abnormal ECG No significant change since last tracing Confirmed by Jerelyn Scott [phone]) on 04/29/2017 4:17:14 PM       Radiology Dg Chest 2 View  Result Date: 04/29/2017 CLINICAL DATA:  Elevated blood pressure today.  Left chest pain. EXAM: CHEST - 2 VIEW COMPARISON:  PA and lateral chest 04/15/2016 and 01/01/2017. FINDINGS: Heart size is normal. Lungs are clear. Heart size is normal. No pneumothorax or pleural effusion. No acute bony abnormality. No focal bony abnormality. IMPRESSION: No acute disease. Electronically Signed   By: Drusilla Kanner M.D.   On: 04/29/2017 13:34    Procedures Procedures (including critical care time)  Medications Ordered in ED Medications  aspirin chewable tablet 324 mg (324 mg Oral Given 04/29/17 1712)  oxyCODONE-acetaminophen (PERCOCET/ROXICET) 5-325 MG per tablet 1 tablet (1 tablet Oral Given 04/29/17 1712)     Initial Impression / Assessment and Plan / ED Course  I have reviewed the triage vital signs and the nursing notes.  Pertinent labs & imaging  results that were available during my care of the patient were reviewed by me and considered in my medical decision making (see chart for details).  Clinical Course as of Apr 29 2053  Tue Apr 29, 2017  1633 Intermittent worse with coughing or moving arm up/down palpation . Palpitations midnight-1 last night. Entresto 1 week.  Excedrin seemed to help.   [CG]    Clinical Course User Index [CG] Liberty Handy, PA-C   Pt is a 42 y.o. male presents with CP described as sharp.  Symptoms have been constant but intermittently worsen with palpation, movement, coughing.  Flu one month ago with persistent coughing.  Pertinent risk factors include HTN, hypercholesterolemia, and previous MI.  On exam VS are wnl. He has reproducible CP, otherwise cardiovascular and pulmonary exam benign. CXR, EKG, troponin x 2 within normal limits.  D-dimer, BNP negative. CBC and BMP unremarkable.  AICD checked with NSR since 2017 when it last fired. Pt was given percocet in ED which improved symptoms. Although pt has h/o MI, his heart score is otherwise low and this CP sounds atypical.  Likely MSK or costochondritis. Pt spoke to his cardiologist Dr Eden Emms via email who also thought it was cough related. Patient has ambulated and tolerated PO in ED. Will dc at this time with strict return precautions. Pt and wife at bedside agreeable.    Final Clinical Impressions(s) / ED Diagnoses   Final diagnoses:  Atypical chest pain    ED Discharge Orders    None       Jerrell Mylar 04/29/17 2055    Phillis Haggis, MD 04/29/17 2057

## 2017-04-29 NOTE — ED Notes (Signed)
Patient Alert and oriented to baseline. Stable and ambulatory to baseline. Patient verbalized understanding of the discharge instructions.  Patient belongings were taken by the patient.   

## 2017-04-29 NOTE — ED Provider Notes (Addendum)
Patient placed in Quick Look pathway, seen and evaluated   Chief Complaint: chest pain  HPI:   Sharp, constant, left sided chest pain onset Saturday with nausea and SOB. Felt palpitations and was told by his cardiologist to come to ED. H/o MI at age 42. Has AICD. Dr. Clide Cliff and Dr. Eden Emms for cards. Flu 1 months ago, symptoms resolved lingering cough. No h/o DVT/PE.   ROS:   Physical Exam:   Gen: No distress  Neuro: Awake and Alert  Skin: Warm    Focused Exam: Looks anxious. Diminished lung sounds to lower lobes bilaterally. RRR, no murmurs. No LE edema or calf pain.  Asked RN Clydie Braun to place bed in room as soon as able. Initiation of care has begun. The patient has been counseled on the process, plan, and necessity for staying for the completion/evaluation, and the remainder of the medical screening examination      Jason Hogan 04/29/17 1305    Jason Hogan Latanya Maudlin, MD 04/29/17 1320

## 2017-04-29 NOTE — Telephone Encounter (Signed)
Spoke with patient's sister in regards to patient's chest pain/muscle pain on the left side rib cage, hypertension (148/100) and tunnel vision.[name] He denies SOB or nausea.  He has been non-compliant with his medications, especially Entresto for awhile now.    The pain has lasted for the past 3 days.. Due to this factor and the fact that he has an extensive cardiac hx, I advised him to go to the ED.[name]   She verbalized understanding.[name]

## 2017-04-29 NOTE — Telephone Encounter (Signed)
Reviewed patients remote transmission. No episodes recorded with a presenting NSR

## 2017-04-29 NOTE — Telephone Encounter (Signed)
Called pt regarding his Entresto. I was able to verify his assistance request was faxed on 2/26 and I will place another sample amount at the front since he is out of his medication. He stated he was having some "left sided rib cage pain" that started after he had a cough. He stated Excedrin helped to relieve the pain, but he was nervous because his heart felt as though it was racing. I told him it sounded like some soreness from his cough. He stated "another nurse in the office told me I should go to the ER for chest pain." I told him that if he felt like he was having active chest pain, I agreed. He stated he understood and will be by to pick up his medication today.

## 2017-04-29 NOTE — Discharge Instructions (Signed)
Labs, EKG, CXR, heart enzymes were ok today, no changes from previous. [employer] checked your ICD and there has only been normal sinus rhythm. Last time your device fired was in 2017.   As we discussed this pain is likely muscular or cartilage inflammation.   Take 1000 mg tylenol every 6 hours for the next 2 days. Take a cough suppressant to stop your cough and give your muscles/ribs a break from coughing.   Follow up with your cardiologist in 1 week if symptoms do not improve.   Return to the emergency department if your chest pain becomes worse with exertion or activity, you have shortness of breath, nausea, vomiting, sweating, palpitations.

## 2017-04-30 NOTE — Telephone Encounter (Signed)
Late entry: Pt was called yesterday with further instructions, see additional notes.

## 2017-05-08 ENCOUNTER — Ambulatory Visit (INDEPENDENT_AMBULATORY_CARE_PROVIDER_SITE_OTHER): Payer: Self-pay | Admitting: *Deleted

## 2017-05-08 DIAGNOSIS — I255 Ischemic cardiomyopathy: Secondary | ICD-10-CM

## 2017-05-08 NOTE — Progress Notes (Signed)
Remote ICD transmission.   

## 2017-05-09 ENCOUNTER — Encounter: Payer: Self-pay | Admitting: Cardiology

## 2017-05-12 LAB — CUP PACEART REMOTE DEVICE CHECK
Battery Remaining Percentage: 36 %
HighPow Impedance: 49 Ohm
Implantable Lead Implant Date: 20111010
Implantable Lead Location: 753860
Implantable Pulse Generator Implant Date: 20111010
Lead Channel Pacing Threshold Amplitude: 1.75 V
Lead Channel Pacing Threshold Pulse Width: 1 ms
Lead Channel Setting Pacing Amplitude: 4 V
Lead Channel Setting Sensing Sensitivity: 0.5 mV
MDC IDC MSMT BATTERY REMAINING LONGEVITY: 34 mo
MDC IDC MSMT BATTERY VOLTAGE: 2.9 V
MDC IDC MSMT LEADCHNL RV IMPEDANCE VALUE: 490 Ohm
MDC IDC MSMT LEADCHNL RV SENSING INTR AMPL: 11.9 mV
MDC IDC SESS DTM: 20190321084936
MDC IDC SET LEADCHNL RV PACING PULSEWIDTH: 1 ms
MDC IDC STAT BRADY RV PERCENT PACED: 1 %
Pulse Gen Serial Number: 615318

## 2017-05-14 ENCOUNTER — Encounter: Payer: Self-pay | Admitting: Internal Medicine

## 2017-05-14 ENCOUNTER — Encounter: Payer: Self-pay | Admitting: Cardiovascular Disease

## 2017-05-26 ENCOUNTER — Other Ambulatory Visit: Payer: Self-pay | Admitting: Nurse Practitioner

## 2017-05-26 DIAGNOSIS — J0140 Acute pansinusitis, unspecified: Secondary | ICD-10-CM

## 2017-06-05 ENCOUNTER — Other Ambulatory Visit: Payer: Self-pay | Admitting: Nurse Practitioner

## 2017-06-05 ENCOUNTER — Other Ambulatory Visit: Payer: Self-pay | Admitting: Cardiovascular Disease

## 2017-06-05 ENCOUNTER — Telehealth: Payer: Self-pay

## 2017-06-05 NOTE — Telephone Encounter (Signed)
-----   Message from Wendall Stade, MD sent at 06/05/2017 11:39 AM EDT ----- Regarding: RE: CARDIAC SURGICAL CLEARANCE Contact: [phone] Ok to have surgery should hold effient 5 days before surgery   ----- Message ----- From: Madison Hickman Sent: 06/05/2017  10:39 AM To: Wendall Stade, MD, Melene Plan, # Subject: CARDIAC SURGICAL CLEARANCE                     1.Type of Surgery: Right Shoulder Arthroscopy with biceps tenotomy + tenodesis, subacromial decompression and rotator cuff repair.  2. Date of Surgery: Undetermined due to clearance  3. Surgeon: Dr. Ave Filter  4. Medication that needs to be held: Please provide this information if needed.   5. Fax: [phone]

## 2017-06-05 NOTE — Telephone Encounter (Signed)
Patient's Effient was discontinued on 04/15/17 at office visit with Anne Ng NP. Will forward to Dr. Eden Emms, so he is aware.

## 2017-06-17 ENCOUNTER — Encounter: Payer: Self-pay | Admitting: Internal Medicine

## 2017-06-18 ENCOUNTER — Telehealth: Payer: Self-pay

## 2017-06-18 ENCOUNTER — Other Ambulatory Visit: Payer: Self-pay | Admitting: Nurse Practitioner

## 2017-06-18 DIAGNOSIS — J0140 Acute pansinusitis, unspecified: Secondary | ICD-10-CM

## 2017-06-18 NOTE — Telephone Encounter (Signed)
**Note De-Identified Jason Hogan Obfuscation** Due to a [facility] message from the pt on 4/30 I called [employer] to check the status of the pts application for pt assistance. I s/w Tomi who confirmed that all they need now is the pts 2018 proof of income and that once they receive that it should take about 7 days to be approved or not.  I called the pt who is aware that they need his 2018 proof of income and he states that he is faxing his W2s to them this Friday (5/3).  I advised him that I have left him 1 box of Entresto samples in the front office and that he can pick anytime between 8-5 M-F. He verbalized understanding and thanked me for my assistance.

## 2017-06-25 ENCOUNTER — Encounter: Payer: Self-pay | Admitting: Internal Medicine

## 2017-07-01 ENCOUNTER — Ambulatory Visit (INDEPENDENT_AMBULATORY_CARE_PROVIDER_SITE_OTHER): Payer: Self-pay | Admitting: Internal Medicine

## 2017-07-01 ENCOUNTER — Other Ambulatory Visit (INDEPENDENT_AMBULATORY_CARE_PROVIDER_SITE_OTHER): Payer: Self-pay

## 2017-07-01 ENCOUNTER — Encounter: Payer: Self-pay | Admitting: Internal Medicine

## 2017-07-01 VITALS — BP 128/86 | HR 95 | Temp 98.4°F | Ht 74.0 in | Wt 234.0 lb

## 2017-07-01 DIAGNOSIS — J4521 Mild intermittent asthma with (acute) exacerbation: Secondary | ICD-10-CM

## 2017-07-01 DIAGNOSIS — R748 Abnormal levels of other serum enzymes: Secondary | ICD-10-CM

## 2017-07-01 DIAGNOSIS — R059 Cough, unspecified: Secondary | ICD-10-CM

## 2017-07-01 DIAGNOSIS — Z0001 Encounter for general adult medical examination with abnormal findings: Secondary | ICD-10-CM

## 2017-07-01 DIAGNOSIS — M2550 Pain in unspecified joint: Secondary | ICD-10-CM

## 2017-07-01 DIAGNOSIS — F419 Anxiety disorder, unspecified: Secondary | ICD-10-CM

## 2017-07-01 DIAGNOSIS — R05 Cough: Secondary | ICD-10-CM

## 2017-07-01 LAB — URINALYSIS, ROUTINE W REFLEX MICROSCOPIC
Bilirubin Urine: NEGATIVE
Ketones, ur: NEGATIVE
Leukocytes, UA: NEGATIVE
Nitrite: NEGATIVE
SPECIFIC GRAVITY, URINE: 1.01 (ref 1.000–1.030)
Total Protein, Urine: NEGATIVE
Urine Glucose: NEGATIVE
Urobilinogen, UA: 0.2 (ref 0.0–1.0)
pH: 7 (ref 5.0–8.0)

## 2017-07-01 LAB — CBC WITH DIFFERENTIAL/PLATELET
BASOS ABS: 0.1 10*3/uL (ref 0.0–0.1)
BASOS PCT: 0.7 % (ref 0.0–3.0)
EOS ABS: 0.3 10*3/uL (ref 0.0–0.7)
Eosinophils Relative: 3.8 % (ref 0.0–5.0)
HEMATOCRIT: 47.4 % (ref 39.0–52.0)
Hemoglobin: 15.4 g/dL (ref 13.0–17.0)
LYMPHS PCT: 19.3 % (ref 12.0–46.0)
Lymphs Abs: 1.5 10*3/uL (ref 0.7–4.0)
MCHC: 32.5 g/dL (ref 30.0–36.0)
MCV: 81.1 fl (ref 78.0–100.0)
MONOS PCT: 17 % — AB (ref 3.0–12.0)
Monocytes Absolute: 1.3 10*3/uL — ABNORMAL HIGH (ref 0.1–1.0)
NEUTROS PCT: 59.2 % (ref 43.0–77.0)
Neutro Abs: 4.6 10*3/uL (ref 1.4–7.7)
Platelets: 290 10*3/uL (ref 150.0–400.0)
RBC: 5.84 Mil/uL — AB (ref 4.22–5.81)
RDW: 15.7 % — ABNORMAL HIGH (ref 11.5–15.5)
WBC: 7.8 10*3/uL (ref 4.0–10.5)

## 2017-07-01 LAB — BASIC METABOLIC PANEL
BUN: 9 mg/dL (ref 6–23)
CHLORIDE: 99 meq/L (ref 96–112)
CO2: 31 mEq/L (ref 19–32)
CREATININE: 1.15 mg/dL (ref 0.40–1.50)
Calcium: 9.5 mg/dL (ref 8.4–10.5)
GFR: 74.35 mL/min (ref >60.00)
GLUCOSE: 79 mg/dL (ref 70–99)
POTASSIUM: 4.2 meq/L (ref 3.5–5.1)
Sodium: 139 mEq/L (ref 135–145)

## 2017-07-01 LAB — HEPATIC FUNCTION PANEL
ALK PHOS: 62 U/L (ref 39–117)
ALT: 21 U/L (ref 0–53)
AST: 25 U/L (ref 0–37)
Albumin: 4.2 g/dL (ref 3.5–5.2)
BILIRUBIN DIRECT: 0.1 mg/dL (ref 0.0–0.3)
BILIRUBIN TOTAL: 0.5 mg/dL (ref 0.2–1.2)
Total Protein: 7.5 g/dL (ref 6.0–8.3)

## 2017-07-01 LAB — LIPID PANEL
Cholesterol: 149 mg/dL (ref 0–200)
HDL: 28.4 mg/dL — AB (ref >39.00)
LDL CALC: 101 mg/dL — AB (ref 0–99)
NONHDL: 120.93
Total CHOL/HDL Ratio: 5
Triglycerides: 101 mg/dL (ref 0.0–149.0)
VLDL: 20.2 mg/dL (ref 0.0–40.0)

## 2017-07-01 LAB — SEDIMENTATION RATE: Sed Rate: 48 mm/hr — ABNORMAL HIGH (ref 0–15)

## 2017-07-01 LAB — CK: Total CK: 319 U/L — ABNORMAL HIGH (ref 7–232)

## 2017-07-01 LAB — PSA: PSA: 0.61 ng/mL (ref 0.10–4.00)

## 2017-07-01 LAB — TSH: TSH: 1.25 u[IU]/mL (ref 0.35–4.50)

## 2017-07-01 MED ORDER — PREDNISONE 10 MG PO TABS
ORAL_TABLET | ORAL | 0 refills | Status: DC
Start: 2017-07-01 — End: 2017-10-02

## 2017-07-01 MED ORDER — HYDROCODONE-HOMATROPINE 5-1.5 MG/5ML PO SYRP: 5.0000 mL | ORAL_SOLUTION | Freq: Four times a day (QID) | ORAL | 0 refills | Status: DC | PRN

## 2017-07-01 MED ORDER — TIZANIDINE HCL 4 MG PO TABS: ORAL_TABLET | ORAL | 2 refills | Status: DC

## 2017-07-01 MED ORDER — BUDESONIDE-FORMOTEROL FUMARATE 80-4.5 MCG/ACT IN AERO: 2.0000 | INHALATION_SPRAY | Freq: Two times a day (BID) | RESPIRATORY_TRACT | 11 refills | Status: DC | PRN

## 2017-07-01 MED ORDER — ALPRAZOLAM 0.5 MG PO TABS: 0.5000 mg | ORAL_TABLET | Freq: Every day | ORAL | 2 refills | Status: DC | PRN

## 2017-07-01 MED ORDER — AZITHROMYCIN 250 MG PO TABS: ORAL_TABLET | ORAL | 1 refills | Status: DC

## 2017-07-01 MED ORDER — ALBUTEROL SULFATE HFA 108 (90 BASE) MCG/ACT IN AERS: 1.0000 | INHALATION_SPRAY | Freq: Four times a day (QID) | RESPIRATORY_TRACT | 0 refills | Status: DC | PRN

## 2017-07-01 NOTE — Progress Notes (Signed)
Subjective:    Patient ID: Jason Hogan, male    DOB: Aug 06, 1975, 42 y.o.   MRN: [id number]  HPI    Here for wellness and f/u;  Overall doing ok;  Pt denies Chest pain, worsening SOB, DOE, wheezing, orthopnea, PND, worsening LE edema, palpitations, dizziness or syncope.  Pt denies neurological change such as new headache, facial or extremity weakness.  Pt denies polydipsia, polyuria, or low sugar symptoms. Pt states overall good compliance with treatment and medications, good tolerability, and has been trying to follow appropriate diet.  Pt denies worsening depressive symptoms, suicidal ideation or panic. No fever, night sweats, wt loss, loss of appetite, or other constitutional symptoms.  Pt states good ability with ADL's, has low fall risk, home safety reviewed and adequate, no other significant changes in hearing or vision, and only occasionally active with exercise. Wt down with better diet and exercise - goes to gym 2 hrs per day/   Wt Readings from Last 3 Encounters:  07/01/17 234 lb (106.1 kg)  04/29/17 240 lb (108.9 kg)  04/15/17 240 lb (108.9 kg)  has seen rheum for joint pain, has appt for right shoulder with ortho soon for displace bicep tendon. Ok for referral to rheum at [facility]. Here with acute onset mild to mod 2-3 days ST, HA, general weakness and malaise, with prod cough greenish sputum, Past Medical History:  Diagnosis Date  . Allergic rhinitis, cause unspecified 10/03/2011  . Anxiety 10/03/2011  . Asthma   . ASTHMA 09/20/2006   Qualifier: Diagnosis of  By: Jonny Ruiz MD, Len Blalock   . 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 Bacterial pneumonia   . CAD (coronary artery disease)   . CARDIOMYOPATHY, ISCHEMIC 10/20/2009   Qualifier: Diagnosis of  By: Graciela Husbands, MD, Ruthann Cancer Ty Hilts   . Cholelithiasis   . CHOLELITHIASIS 06/22/2007   Qualifier: Diagnosis of  By: Maris Berger   . Chronic systolic  heart failure (HCC) 04/22/7423   Qualifier: Diagnosis of  By: Kem Parkinson    . Depression   . DEPRESSION 06/22/2007   Qualifier: Diagnosis of  By: Maris Berger   . Elevated blood pressure   . Heart failure   . Hypercholesteremia   . HYPERCHOLESTEROLEMIA 09/18/2009   Qualifier: Diagnosis of  By: Kem Parkinson    . Hyperlipidemia   . Impaired glucose tolerance 10/05/2012  . Insomnia   . INSOMNIA-SLEEP DISORDER-UNSPEC 06/22/2007   Qualifier: Diagnosis of  By: Jonny Ruiz MD, Len Blalock   . 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 Migraine headache   . MIGRAINE HEADACHE 09/18/2009   Qualifier: Diagnosis of  By: Kem Parkinson    . MYOCARDIAL INFARCTION 09/18/2009   Qualifier: Diagnosis of  By: Kem Parkinson    . Myocardial infarction (HCC)   . Neck pain    right  . Renal calculus   . RENAL CALCULUS, HX OF 09/20/2006   Qualifier: Diagnosis of  By: Jonny Ruiz MD, Len Blalock   . Renal insufficiency   . RENAL INSUFFICIENCY 09/18/2009   Qualifier: Diagnosis of  By: Kem Parkinson    . Respiratory failure (HCC)   . Respiratory failure (HCC)   . Rhabdomyolysis   . Rhabdomyolysis 09/18/2009   Qualifier: Diagnosis of  By: Kem Parkinson    . URI (upper respiratory infection)    Past Surgical History:  Procedure Laterality Date  . CARDIAC CATHETERIZATION N/A 05/17/2015   Procedure: Left  Heart Cath and Coronary Angiography;  Surgeon: Wendall Stade, MD;  Location: [facility] INVASIVE CV LAB;  Service: Cardiovascular;  Laterality: N/A;    reports that he quit smoking about 23 years ago. He has a 0.20 pack-year smoking history. He has never used smokeless tobacco. He reports that he drinks alcohol. He reports that he does not use drugs. family history includes Diabetes in his unknown relative; Heart attack in his father; Heart disease in his unknown relative; Hypothyroidism in his unknown relative. Allergies  Allergen Reactions  . Levaquin [Levofloxacin] Other (See Comments)    Joint pain  . Ace Inhibitors  Cough  . Crestor [Rosuvastatin] Other (See Comments)    Sweet craving  . Lunesta [Eszopiclone] Other (See Comments)    Bitter taste  . Zocor [Simvastatin] Other (See Comments)    Memory problem   Current Outpatient Medications on File Prior to Visit  Medication Sig Dispense Refill  . allopurinol (ZYLOPRIM) 300 MG tablet TAKE 1 TABLET BY MOUTH EVERY DAY (Patient taking differently: TAKE 1 TABLET (300 MG)BY MOUTH EVERY DAY) 90 tablet 0  . aspirin EC 81 MG tablet Take 81 mg by mouth daily.    [name] atorvastatin (LIPITOR) 20 MG tablet Take 1 tablet (20 mg total) by mouth daily. 90 tablet 3  . carvedilol (COREG) 6.25 MG tablet TAKE 1 TABLET BY MOUTH TWICE A DAY WITH A MEAL (Patient taking differently: TAKE 1 TABLET (6.25 MG) BY MOUTH TWICE A DAY WITH A MEAL) 60 tablet 11  . cetirizine (ZYRTEC) 10 MG tablet Take 10 mg by mouth daily as needed (seasonal allergies).     . Coenzyme Q10 (CO Q 10) 100 MG CAPS Take 100 mg by mouth daily.     [name] COLCRYS 0.6 MG tablet TAKE 1 TABLET BY MOUTH TWICE A DAY (Patient taking differently: TAKE 1 TABLET (0.6) BY MOUTH TWICE A DAY AS NEEDED FOR GOUT ATTACKS) 60 tablet 0  . dextromethorphan-guaiFENesin (MUCINEX DM) 30-600 MG 12hr tablet Take 1 tablet by mouth 2 (two) times daily as needed for cough. 14 tablet 0  . ENTRESTO 24-26 MG TAKE 1 TABLET BY MOUTH TWICE A DAY 180 tablet 3  . fluticasone (FLONASE) 50 MCG/ACT nasal spray PLACE 2 SPRAYS INTO BOTH NOSTRILS DAILY AS NEEDED (SEASONAL ALLERGIES). 16 g 3  . metolazone (ZAROXOLYN) 5 MG tablet TAKE 1 TABLET (5 MG TOTAL) BY MOUTH DAILY AS NEEDED (FOR EDEMA). 30 tablet 9  . mexiletine (MEXITIL) 200 MG capsule Take 1 capsule (200 mg total) by mouth 2 (two) times daily. 60 capsule 7  . potassium chloride SA (K-DUR,KLOR-CON) 20 MEQ tablet Take 2 tablets (40 mEq total) by mouth daily. 60 tablet 11  . spironolactone (ALDACTONE) 25 MG tablet Take 0.5 tablets (12.5 mg total) by mouth daily. (Patient taking differently: Take 12.5 mg by  mouth at bedtime. )    . torsemide (DEMADEX) 20 MG tablet Take 1 tablet (20 mg total) by mouth 2 (two) times daily. 120 tablet 7  . traMADol (ULTRAM) 50 MG tablet Take 1 tablet (50 mg total) every 8 (eight) hours as needed by mouth. 90 tablet 1  . Vitamin D, Ergocalciferol, (DRISDOL) 50000 units CAPS capsule Take 1 capsule (50,000 Units total) by mouth every 7 (seven) days. 12 capsule 0   Current Facility-Administered Medications on File Prior to Visit  Medication Dose Route Frequency Provider Last Rate Last Dose  . ipratropium-albuterol (DUONEB) 0.5-2.5 (3) MG/3ML nebulizer solution 3 mL  3 mL Nebulization Q6H Nche, Bonna Gains,  NP   3 mL at 04/15/17 1320    Review of Systems Constitutional: Negative for other unusual diaphoresis, sweats, appetite or weight changes HENT: Negative for other worsening hearing loss, ear pain, facial swelling, mouth sores or neck stiffness.   Eyes: Negative for other worsening pain, redness or other visual disturbance.  Respiratory: Negative for other stridor or swelling Cardiovascular: Negative for other palpitations or other chest pain  Gastrointestinal: Negative for worsening diarrhea or loose stools, blood in stool, distention or other pain Genitourinary: Negative for hematuria, flank pain or other change in urine volume.  Musculoskeletal: Negative for myalgias or other joint swelling.  Skin: Negative for other color change, or other wound or worsening drainage.  Neurological: Negative for other syncope or numbness. Hematological: Negative for other adenopathy or swelling Psychiatric/Behavioral: Negative for hallucinations, other worsening agitation, SI, self-injury, or new decreased concentration All other system neg per pt    Objective:   Physical Exam BP 128/86   Pulse 95   Temp 98.4 F (36.9 C) (Oral)   Ht 6\' 2"  (1.88 m)   Wt 234 lb (106.1 kg)   SpO2 100%   BMI 30.04 kg/m  VS noted, mild ill appearing Constitutional: Pt is oriented to  person, place, and time. Appears well-developed and well-nourished, in no significant distress and comfortable Head: Normocephalic and atraumatic  Bilat tm's with mild erythema.  Max sinus areas mild tender.  Pharynx with mild erythema, no exudate  Eyes: Conjunctivae and EOM are normal. Pupils are equal, round, and reactive to light Right Ear: External ear normal without discharge Left Ear: External ear normal without discharge Nose: Nose without discharge or deformity Mouth/Throat: Oropharynx is without other ulcerations and moist  Neck: Normal range of motion. Neck supple. No JVD present. No tracheal deviation present or significant neck LA or mass Cardiovascular: Normal rate, regular rhythm, normal heart sounds and intact distal pulses.   Pulmonary/Chest: WOB normal and breath sounds decresaed without rales but with mild bilat wheezing  Abdominal: Soft. Bowel sounds are normal. NT. No HSM  Musculoskeletal: Normal range of motion. Exhibits no edema Lymphadenopathy: Has no other cervical adenopathy.  Neurological: Pt is alert and oriented to person, place, and time. Pt has normal reflexes. No cranial nerve deficit. Motor grossly intact, Gait intact Skin: Skin is warm and dry. No rash noted or new ulcerations Psychiatric:  Has nervous mood and affect. Behavior is normal without agitation No other exam findings  Lab Results  Component Value Date   WBC 9.8 04/29/2017   HGB 15.0 04/29/2017   HCT 46.6 04/29/2017   PLT 249 04/29/2017   GLUCOSE 81 04/29/2017   CHOL 167 10/24/2015   TRIG 237.0 (H) 10/24/2015   HDL 40.20 10/24/2015   LDLDIRECT 101.0 10/24/2015   LDLCALC 163 (H) 11/17/2014   ALT 28 07/17/2016   AST 42 (H) 07/17/2016   NA 138 04/29/2017   K 3.6 04/29/2017   CL 97 (L) 04/29/2017   CREATININE 1.89 (H) 04/29/2017   BUN 23 (H) 04/29/2017   CO2 29 04/29/2017   TSH 1.13 10/24/2015   PSA 0.26 10/24/2015   INR 1.1 (H) 08/01/2016   HGBA1C 5.0 07/04/2016      Assessment &  Plan:

## 2017-07-01 NOTE — Assessment & Plan Note (Signed)
Mild to mod, for antibx course, for cough med prn, prednisone and refill inhalers for wheezing, to f/u any worsening symptoms or concerns

## 2017-07-01 NOTE — Assessment & Plan Note (Signed)
For rheum referral 

## 2017-07-01 NOTE — Assessment & Plan Note (Signed)
Did not tolerate lexapro, ok for xanax prn

## 2017-07-01 NOTE — Patient Instructions (Signed)
Please take all new medication as prescribed - the xanax, antibiotic, cough medicine and prednisone  Please continue all other medications as before, and refills have been done if requested.  Please have the pharmacy call with any other refills you may need.  Please continue your efforts at being more active, low cholesterol diet, and weight control.  You are otherwise up to date with prevention measures today.  Please keep your appointments with your specialists as you may have planned  You will be contacted regarding the referral for: Rheumatology  Please go to the LAB in the Basement (turn left off the elevator) for the tests to be done today  You will be contacted by phone if any changes need to be made immediately.  Otherwise, you will receive a letter about your results with an explanation, but please check with MyChart first.  Please remember to sign up for MyChart if you have not done so, as this will be important to you in the future with finding out test results, communicating by private email, and scheduling acute appointments online when needed.  Please return in 1 year for your yearly visit, or sooner if needed, with Lab testing done 3-5 days before

## 2017-07-01 NOTE — Assessment & Plan Note (Signed)
?   Myositis - for rheum referral - [place]

## 2017-07-06 ENCOUNTER — Other Ambulatory Visit: Payer: Self-pay | Admitting: Cardiovascular Disease

## 2017-08-07 ENCOUNTER — Telehealth: Payer: Self-pay | Admitting: Cardiology

## 2017-08-07 ENCOUNTER — Ambulatory Visit (INDEPENDENT_AMBULATORY_CARE_PROVIDER_SITE_OTHER): Payer: Self-pay | Admitting: *Deleted

## 2017-08-07 DIAGNOSIS — I255 Ischemic cardiomyopathy: Secondary | ICD-10-CM

## 2017-08-07 DIAGNOSIS — I5022 Chronic systolic (congestive) heart failure: Secondary | ICD-10-CM

## 2017-08-07 NOTE — Telephone Encounter (Signed)
Spoke with pt and reminded pt of remote transmission that is due today. Pt verbalized understanding.   

## 2017-08-08 NOTE — Progress Notes (Signed)
Remote ICD transmission.   

## 2017-08-26 LAB — CUP PACEART REMOTE DEVICE CHECK
HighPow Impedance: 50 Ohm
Implantable Lead Implant Date: 20111010
Implantable Lead Location: 753860
Implantable Pulse Generator Implant Date: 20111010
Lead Channel Pacing Threshold Amplitude: 1.75 V
Lead Channel Pacing Threshold Pulse Width: 1 ms
Lead Channel Setting Sensing Sensitivity: 0.5 mV
MDC IDC MSMT BATTERY REMAINING LONGEVITY: 32 mo
MDC IDC MSMT BATTERY REMAINING PERCENTAGE: 34 %
MDC IDC MSMT BATTERY VOLTAGE: 2.9 V
MDC IDC MSMT LEADCHNL RV IMPEDANCE VALUE: 490 Ohm
MDC IDC MSMT LEADCHNL RV SENSING INTR AMPL: 11.9 mV
MDC IDC SESS DTM: 20190621121722
MDC IDC SET LEADCHNL RV PACING AMPLITUDE: 4 V
MDC IDC SET LEADCHNL RV PACING PULSEWIDTH: 1 ms
MDC IDC STAT BRADY RV PERCENT PACED: 1 %
Pulse Gen Serial Number: 615318

## 2017-08-28 ENCOUNTER — Encounter: Payer: Self-pay | Admitting: Internal Medicine

## 2017-09-13 ENCOUNTER — Encounter: Payer: Self-pay | Admitting: Cardiovascular Disease

## 2017-09-16 ENCOUNTER — Telehealth: Payer: Self-pay

## 2017-09-16 DIAGNOSIS — I5022 Chronic systolic (congestive) heart failure: Secondary | ICD-10-CM

## 2017-09-16 DIAGNOSIS — Z79899 Other long term (current) drug therapy: Secondary | ICD-10-CM

## 2017-09-16 MED ORDER — TORSEMIDE 20 MG PO TABS: 40.0000 mg | ORAL_TABLET | Freq: Two times a day (BID) | ORAL | 11 refills | Status: DC

## 2017-09-16 NOTE — Telephone Encounter (Signed)
Patient started Torsemide 40 mg twice daily on 12/23/16, and patient has continued with this dose. Patient should be taking Torsemide 40 mg twice daily will send in new prescription. Patient will come in for BMET on Friday.

## 2017-09-19 ENCOUNTER — Other Ambulatory Visit: Payer: Self-pay

## 2017-09-30 NOTE — Telephone Encounter (Signed)
Spoke with patient, the patient has been experiencing chest pain, that comes and goes, sharp pain and chest pressure. The pain is a 3/10. The pain is worse with exercise or movement. The pain is better when relaxing. Scheduled for an appointment with Joni Reining, NP at [facility] at 9 am on 8/15. The patient accepted and expressed understanding. The patient denied SOB, fatigue or dizziness.

## 2017-10-01 NOTE — Progress Notes (Signed)
Cardiology Office Note   Date:  10/02/2017   ID:  Jason Hogan, DOB 1975-12-27, MRN [id number]  PCP:  Corwin Levins, MD  Cardiologist:  Dr. Eden Emms  Chief Complaint  Patient presents with  . Chest Pain  . Cardiomyopathy     History of Present Illness: Jason Hogan is a 42 y.o. male who presents for ongoing assessment and management of CAD, hx of large anterior MI, collateralized silent distal RCA occlusion, Hx of ICD implant, followed by Dr. Graciela Husbands. Other history includes polyarthralgia and hematuria.   He called our office on 09/30/2017 with complaints of chest pain which comes and goes, described as sharp, worse with movement and exercise.   He usually does a lot of weight lifting at the gym but has had some issues with DDD in his back, as well as soreness on the left side of his chest. He states that he stopped working out for the last two weeks to see if this improved his discomfort. The pain it constant, worse when he presses on the left side of his chest in the pectoral area.   He also has had some feelings of food sticking his throat when he eats steak or heavy foods. He drinks water and is able to swallow the foods.   He denies diaphoresis, dizziness, or dyspnea. He has not needed to take NTG. He has also not taken any NSAIDS. He rarely takes metolazone, can't remember when he took this last.   Past Medical History:  Diagnosis Date  . Allergic rhinitis, cause unspecified 10/03/2011  . Anxiety 10/03/2011  . Asthma   . ASTHMA 09/20/2006   Qualifier: Diagnosis of  By: Jonny Ruiz MD, Len Blalock   . 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 Bacterial pneumonia   . CAD (coronary artery disease)   . CARDIOMYOPATHY, ISCHEMIC 10/20/2009   Qualifier: Diagnosis of  By: Graciela Husbands, MD, Ruthann Cancer Ty Hilts   . Cholelithiasis   . CHOLELITHIASIS 06/22/2007   Qualifier: Diagnosis of  By: Maris Berger   .  Chronic systolic heart failure (HCC) 09/18/2009   Qualifier: Diagnosis of  By: Kem Parkinson    . Depression   . DEPRESSION 06/22/2007   Qualifier: Diagnosis of  By: Maris Berger   . Elevated blood pressure   . Heart failure   . Hypercholesteremia   . HYPERCHOLESTEROLEMIA 09/18/2009   Qualifier: Diagnosis of  By: Kem Parkinson    . Hyperlipidemia   . Impaired glucose tolerance 10/05/2012  . Insomnia   . INSOMNIA-SLEEP DISORDER-UNSPEC 06/22/2007   Qualifier: Diagnosis of  By: Jonny Ruiz MD, Len Blalock   . 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 Migraine headache   . MIGRAINE HEADACHE 09/18/2009   Qualifier: Diagnosis of  By: Kem Parkinson    . MYOCARDIAL INFARCTION 09/18/2009   Qualifier: Diagnosis of  By: Kem Parkinson    . Myocardial infarction (HCC)   . Neck pain    right  . Renal calculus   . RENAL CALCULUS, HX OF 09/20/2006   Qualifier: Diagnosis of  By: Jonny Ruiz MD, Len Blalock   . Renal insufficiency   . RENAL INSUFFICIENCY 09/18/2009   Qualifier: Diagnosis of  By: Kem Parkinson    . Respiratory failure (HCC)   . Respiratory failure (HCC)   . Rhabdomyolysis   . Rhabdomyolysis 09/18/2009   Qualifier: Diagnosis of  By: Kem Parkinson    . URI (upper respiratory  infection)     Past Surgical History:  Procedure Laterality Date  . CARDIAC CATHETERIZATION N/A 05/17/2015   Procedure: Left Heart Cath and Coronary Angiography;  Surgeon: Wendall Stade, MD;  Location: [facility] INVASIVE CV LAB;  Service: Cardiovascular;  Laterality: N/A;     Current Outpatient Medications  Medication Sig Dispense Refill  . albuterol (PROAIR HFA) 108 (90 Base) MCG/ACT inhaler Inhale 1-2 puffs into the lungs every 6 (six) hours as needed for wheezing or shortness of breath. 1 Inhaler 0  . ALPRAZolam (XANAX) 0.5 MG tablet Take 1 tablet (0.5 mg total) by mouth daily as needed for anxiety. 30 tablet 2  . aspirin EC 81 MG tablet Take 81 mg by mouth daily.    [name] atorvastatin (LIPITOR) 20 MG tablet Take 1 tablet (20  mg total) by mouth daily. 90 tablet 3  . budesonide-formoterol (SYMBICORT) 80-4.5 MCG/ACT inhaler Inhale 2 puffs into the lungs 2 (two) times daily as needed (wheezing). 1 Inhaler 11  . carvedilol (COREG) 6.25 MG tablet TAKE 1 TABLET BY MOUTH TWICE A DAY 180 tablet 1  . Coenzyme Q10 (CO Q 10) 100 MG CAPS Take 100 mg by mouth daily.     [name] ENTRESTO 24-26 MG TAKE 1 TABLET BY MOUTH TWICE A DAY 180 tablet 3  . fluticasone (FLONASE) 50 MCG/ACT nasal spray PLACE 2 SPRAYS INTO BOTH NOSTRILS DAILY AS NEEDED (SEASONAL ALLERGIES). 16 g 3  . metolazone (ZAROXOLYN) 5 MG tablet TAKE 1 TABLET (5 MG TOTAL) BY MOUTH DAILY AS NEEDED (FOR EDEMA). 30 tablet 9  . mexiletine (MEXITIL) 200 MG capsule Take 1 capsule (200 mg total) by mouth 2 (two) times daily. 60 capsule 7  . potassium chloride SA (K-DUR,KLOR-CON) 20 MEQ tablet Take 2 tablets (40 mEq total) by mouth daily. 60 tablet 11  . spironolactone (ALDACTONE) 25 MG tablet Take 0.5 tablets (12.5 mg total) by mouth daily. (Patient taking differently: Take 12.5 mg by mouth at bedtime. )    . tiZANidine (ZANAFLEX) 4 MG tablet TAKE 1 TABLET BY MOUTH EVERY 6 HOURS AS NEEDED SPASMS OF THE MUSCLE 60 tablet 2  . torsemide (DEMADEX) 20 MG tablet Take 2 tablets (40 mg total) by mouth 2 (two) times daily. 120 tablet 11  . nitroGLYCERIN (NITROSTAT) 0.4 MG SL tablet Place 1 tablet (0.4 mg total) under the tongue every 5 (five) minutes as needed for chest pain. 90 tablet 3  . traMADol (ULTRAM) 50 MG tablet Take 1 tablet (50 mg total) by mouth every 8 (eight) hours as needed. 30 tablet 0   Current Facility-Administered Medications  Medication Dose Route Frequency Provider Last Rate Last Dose  . ipratropium-albuterol (DUONEB) 0.5-2.5 (3) MG/3ML nebulizer solution 3 mL  3 mL Nebulization Q6H Nche, Bonna Gains, NP   3 mL at 04/15/17 1320    Allergies:   Levaquin [levofloxacin]; Ace inhibitors; Crestor [rosuvastatin]; Lunesta [eszopiclone]; and Zocor [simvastatin]    Social  History:  The patient  reports that he quit smoking about 23 years ago. He has a 0.20 pack-year smoking history. He has never used smokeless tobacco. He reports that he drinks alcohol. He reports that he does not use drugs.   Family History:  The patient's family history includes Diabetes in his unknown relative; Heart attack in his father; Heart disease in his unknown relative; Hypothyroidism in his unknown relative.    ROS: All other systems are reviewed and negative. Unless otherwise mentioned in H&P    PHYSICAL EXAM: VS:  BP 132/84  Pulse 78   Ht 6\' 2"  (1.88 m)   Wt 227 lb 9.6 oz (103.2 kg)   BMI 29.22 kg/m  , BMI Body mass index is 29.22 kg/m. GEN: Well nourished, well developed, in no acute distress  HEENT: normal  Neck: no JVD, carotid bruits, or masses Cardiac: RRR; no murmurs, rubs, or gallops,no edema  Respiratory:  clear to auscultation bilaterally, normal work of breathing GI: soft, nontender, nondistended, + BS MS: no deformity or atrophy Pain is reproducible with palpation of the left pectoral area. Not with ROM of the left shoulder.  Skin: warm and dry, no rash Neuro:  Strength and sensation are intact Psych: euthymic mood, full affect   EKG: NSR rate of 78 bpm, Right atrial enlargement anterior septal Q wave.(Unchanged from prior EKG in 04/2017)   Recent Labs: 02/17/2017: NT-Pro BNP 104 04/29/2017: B Natriuretic Peptide 78.5; Magnesium 1.7 07/01/2017: ALT 21; BUN 9; Creatinine, Ser 1.15; Hemoglobin 15.4; Platelets 290.0; Potassium 4.2; Sodium 139; TSH 1.25    Lipid Panel    Component Value Date/Time   CHOL 149 07/01/2017 1430   TRIG 101.0 07/01/2017 1430   HDL 28.40 (L) 07/01/2017 1430   CHOLHDL 5 07/01/2017 1430   VLDL 20.2 07/01/2017 1430   LDLCALC 101 (H) 07/01/2017 1430   LDLDIRECT 101.0 10/24/2015 1554      Wt Readings from Last 3 Encounters:  10/02/17 227 lb 9.6 oz (103.2 kg)  07/01/17 234 lb (106.1 kg)  04/29/17 240 lb (108.9 kg)    Other  studies Reviewed: Device History: ICD implanted 2011 for primary prevention  History of appropriate therapy: Yes   3/17 VT-History of AAD therapy: Yes  ATP and shocks  Echo 05/16/15 reviewed EF 30-35% Cath 05/17/15  Stable disease   Prox RCA lesion, 25% stenosed.  Mid RCA lesion, 99% stenosed.  Dist RCA lesion, 100% stenosed.  Mid Cx to Dist Cx lesion, 40% stenosed.  Prox LAD to Mid LAD lesion, 10% stenosed. The lesion was previously treated with a stent (unknown type).  Dist LAD lesion, 30% stenosed.  No LAD stent restenosis No progression of moderate circumflex disease Progression of mid RCA disease but with known CTO distal RCA And left to right collaterals no indication to intervene on mid vessel That would only supply small RV branch Patient not having chest pain  ASSESSMENT AND PLAN:  1.  Chest Pain: Appears to be atypical and likely musculoskeletal as the pain is constant, reproducible with palpation, but also soreness throughout the day. He has Tramadol for pain that he takes on occasion for this. I have recommended that he use this prn and alternate with Tylenol Avoid NSAID's.He is avoiding heavy lifting until he feels better.   I will check electrolytes to evaluate for hypokalemia, or hypomagnesemia. He is on  Spironolactone and therefore not on potassium supplements. .   2. CAD; Due to his history, I will have him complete a stress test to evaluate for progression of CAD. Rx of NTG provided.   3. ICM: ICD in situ. Followed by Dr. Graciela Husbands with normal functioning device without discharges. Continue coreg, spironolactone, and Entresto. Last echo 08/27/2016.   4. Hypercholesterolemia: On statin therapy will need follow up fasting lipids and LFTs with goal of LDL <70.   5. Dysphagia: Recommended Pepcid. If symptoms worsen he should be referred to GI.   Current medicines are reviewed at length with the patient today.    Labs/ tests ordered today include: BMET, Mg,  Stress Myoview.  Bettey Mare. Mellanie Bejarano DNP, ANP, AACC Echo 05/16/15 reviewed EF 30-35% Cath 05/17/15  Stable disease   Prox RCA lesion, 25% stenosed.  Mid RCA lesion, 99% stenosed.  Dist RCA lesion, 100% stenosed.  Mid Cx to Dist Cx lesion, 40% stenosed.  Prox LAD to Mid LAD lesion, 10% stenosed. The lesion was previously treated with a stent (unknown type).  Dist LAD lesion, 30% stenosed.  No LAD stent restenosis No progression of moderate circumflex disease Progression of mid RCA disease but with known CTO distal RCA And left to right collaterals no indication to intervene on mid vessel That would only supply small RV branch Patient not having chest pain Indication for cath was multiple AICD d/c's   10/02/2017 10:06 AM    [facility] [address] [place], [place] [address] Phone: [phone]; Fax: [phone]

## 2017-10-02 ENCOUNTER — Ambulatory Visit (INDEPENDENT_AMBULATORY_CARE_PROVIDER_SITE_OTHER): Payer: Self-pay | Admitting: Adult Health

## 2017-10-02 ENCOUNTER — Encounter: Payer: Self-pay | Admitting: Adult Health

## 2017-10-02 VITALS — BP 132/84 | HR 78 | Ht 74.0 in | Wt 227.6 lb

## 2017-10-02 DIAGNOSIS — R1319 Other dysphagia: Secondary | ICD-10-CM

## 2017-10-02 DIAGNOSIS — E7800 Pure hypercholesterolemia, unspecified: Secondary | ICD-10-CM

## 2017-10-02 DIAGNOSIS — I25119 Atherosclerotic heart disease of native coronary artery with unspecified angina pectoris: Secondary | ICD-10-CM

## 2017-10-02 DIAGNOSIS — Z79899 Other long term (current) drug therapy: Secondary | ICD-10-CM

## 2017-10-02 DIAGNOSIS — I43 Cardiomyopathy in diseases classified elsewhere: Secondary | ICD-10-CM

## 2017-10-02 DIAGNOSIS — R1310 Dysphagia, unspecified: Secondary | ICD-10-CM

## 2017-10-02 DIAGNOSIS — R079 Chest pain, unspecified: Secondary | ICD-10-CM

## 2017-10-02 MED ORDER — NITROGLYCERIN 0.4 MG SL SUBL: 0.4000 mg | SUBLINGUAL_TABLET | SUBLINGUAL | 3 refills | Status: DC | PRN

## 2017-10-02 MED ORDER — TRAMADOL HCL 50 MG PO TABS: 50.0000 mg | ORAL_TABLET | Freq: Three times a day (TID) | ORAL | 0 refills | Status: DC | PRN

## 2017-10-02 NOTE — Patient Instructions (Signed)
Medication Instructions:  TAKE TRAMADOL 50MG  EVERY 8 HOURS AS-NEEDED  TAKE NITROGLYCERIN AS DIRECTED ON BOTTLE  If you need a refill on your cardiac medications before your next appointment, please call your pharmacy.  Labwork: BMET AND MAG TODAY HERE IN OUR OFFICE AT [facility]  Take the provided lab slips with you to the lab for your blood draw.   Testing/Procedures: Your physician has requested that you have an Exercise Myoview. A cardiac stress test is a cardiological test that measures the heart's ability to respond to external stress in a controlled clinical environment. The stress response is induced by exercise (exercise-treadmill). For further information please visit [email or web address] If you have questions or concerns about your appointment, you can call the Nuclear Lab at [phone].   Special Instructions: MAY TAKE OTC-PEPSID AS NEEDED FOR HEARTBURN  Follow-Up: Your physician wants you to follow-up in: 1st AVAILABLE WITH DR Clelia Croft TESTING    Thank you for choosing [facility] at [facility]!!

## 2017-10-03 LAB — BASIC METABOLIC PANEL
BUN/Creatinine Ratio: 9 (ref 9–20)
BUN: 10 mg/dL (ref 6–24)
CALCIUM: 10.1 mg/dL (ref 8.7–10.2)
CO2: 28 mmol/L (ref 20–29)
CREATININE: 1.16 mg/dL (ref 0.76–1.27)
Chloride: 96 mmol/L (ref 96–106)
GFR calc Af Amer: 90 mL/min/{1.73_m2} (ref >59)
GFR, EST NON AFRICAN AMERICAN: 78 mL/min/{1.73_m2} (ref >59)
GLUCOSE: 80 mg/dL (ref 65–99)
Potassium: 4 mmol/L (ref 3.5–5.2)
SODIUM: 139 mmol/L (ref 134–144)

## 2017-10-03 LAB — MAGNESIUM: MAGNESIUM: 2 mg/dL (ref 1.6–2.3)

## 2017-10-09 ENCOUNTER — Telehealth (HOSPITAL_COMMUNITY): Payer: Self-pay

## 2017-10-09 ENCOUNTER — Other Ambulatory Visit: Payer: Self-pay | Admitting: Internal Medicine

## 2017-10-09 NOTE — Telephone Encounter (Signed)
Encounter complete. 

## 2017-10-10 ENCOUNTER — Other Ambulatory Visit: Payer: Self-pay | Admitting: *Deleted

## 2017-10-10 ENCOUNTER — Telehealth (HOSPITAL_COMMUNITY): Payer: Self-pay

## 2017-10-10 MED ORDER — SPIRONOLACTONE 25 MG PO TABS: 12.5000 mg | ORAL_TABLET | Freq: Every day | ORAL | 3 refills | Status: DC

## 2017-10-10 NOTE — Telephone Encounter (Signed)
Encounter complete. 

## 2017-10-14 ENCOUNTER — Inpatient Hospital Stay (HOSPITAL_COMMUNITY): Admission: RE | Admit: 2017-10-14 | Payer: Self-pay | Source: Ambulatory Visit

## 2017-10-15 ENCOUNTER — Telehealth (HOSPITAL_COMMUNITY): Payer: Self-pay

## 2017-10-15 NOTE — Telephone Encounter (Signed)
Encounter complete. 

## 2017-10-16 ENCOUNTER — Ambulatory Visit (HOSPITAL_COMMUNITY)
Admission: RE | Admit: 2017-10-16 | Discharge: 2017-10-16 | Disposition: A | Discharge status: Home / Self Care | Payer: Self-pay | Source: Ambulatory Visit | Attending: Cardiology | Admitting: Cardiology

## 2017-10-16 DIAGNOSIS — I25119 Atherosclerotic heart disease of native coronary artery with unspecified angina pectoris: Secondary | ICD-10-CM | POA: Insufficient documentation

## 2017-10-16 DIAGNOSIS — R079 Chest pain, unspecified: Secondary | ICD-10-CM | POA: Insufficient documentation

## 2017-10-16 LAB — MYOCARDIAL PERFUSION IMAGING
CHL CUP NUCLEAR SDS: 0
CHL CUP NUCLEAR SRS: 24
CHL CUP NUCLEAR SSS: 24
LV dias vol: 202 mL (ref 62–150)
LVSYSVOL: 151 mL
NUC STRESS TID: 1.05
Peak HR: 102 {beats}/min
Rest HR: 86 {beats}/min

## 2017-10-16 MED ORDER — REGADENOSON 0.4 MG/5ML IV SOLN
0.4000 mg | Freq: Once | INTRAVENOUS | Status: AC
  Administered 2017-10-16: 0.4 mg via INTRAVENOUS

## 2017-10-16 MED ORDER — TECHNETIUM TC 99M TETROFOSMIN IV KIT
24.0000 | PACK | Freq: Once | INTRAVENOUS | Status: AC | PRN
  Administered 2017-10-16: 24 via INTRAVENOUS
  Filled 2017-10-16: qty 24

## 2017-10-16 MED ORDER — TECHNETIUM TC 99M TETROFOSMIN IV KIT
8.8000 | PACK | Freq: Once | INTRAVENOUS | Status: AC | PRN
  Administered 2017-10-16: 8.8 via INTRAVENOUS
  Filled 2017-10-16: qty 9

## 2017-10-21 ENCOUNTER — Telehealth: Payer: Self-pay

## 2017-10-21 DIAGNOSIS — R0789 Other chest pain: Secondary | ICD-10-CM

## 2017-10-21 NOTE — Telephone Encounter (Signed)
-----   Message from Wendall Stade, MD sent at 10/19/2017  1:56 PM EDT ----- Have him get echo to validate EF and f/u with me to discuss possible cath if EF really has gotten worse despite lack of ischemia Chest pain seems atypical but he has not had cath in over 2 years

## 2017-10-21 NOTE — Telephone Encounter (Signed)
Called patient to let him know that Dr. Eden Emms would like him to get an echo and then have an office visit afterwards to discuss echo and possible cath. Patient verbalized understanding. Will have [facility] call patient with echo appt.

## 2017-10-24 ENCOUNTER — Encounter: Payer: Self-pay | Admitting: Cardiovascular Disease

## 2017-10-31 ENCOUNTER — Other Ambulatory Visit: Payer: Self-pay | Admitting: Internal Medicine

## 2017-11-03 NOTE — Progress Notes (Signed)
Patient ID: Jason Hogan, male   DOB: 06-05-1975, 42 y.o.   MRN: [id number]    Jason Hogan is seen todya for F/U of CAD with large Anterior M.I. 2011  and collateralized silent distal RCA occlusion. 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 Has been seeing [name] at [facility] Less than 2% PVC burden last EP visit October 2018 with mexiletine   Also seen by ENT there 09/2014 for epistaxis with negative evaluation.  Seen by urology for Low T on clomid but caused irritability and then anastrazole  With improvement in fatigue and libido  Did not tolerate aldactone post m.i. with excessive fatiigue and also pruritis but on it now  Seen by Dr [name] and [name] for polyarthragia and hematuria.  Noted labs done 07/17/16 with ESR 88 and positive ANA UA with large Hb has had stones in past Cr stable .57  Malaise started beginning of May was Rx with levaquin but felt worse  Reviewed CT abdomen 07/05/16 normal no etiology for hematuria   He feels much better on prednisone taper demedex doing better job diuresing  Needs to be re evaluated by rheumatology for persistent elevation in ESR and  polymyalgia  Has right shoulder issue Can't have MRI to see if rotator cuff or labrum Seeing Dr Gerrie Nordmann NP August 15 with atypical chest pain in setting of lifting weights and chronic lower back pain Some dysphagia to meat myovue reviewed from 10/16/17 EF estimated at 25% large anterior MI no ischemia Was to have f/u TTE to validate lower EF Has been on entresto, coreg and aldactone as well as demadex.   Having more exertional dyspnea and some chest tightness. This despite good diet weight loss And  compliance with meds TTE from today suggests EF 30-35%  Lab Results  Component Value Date   WBC 7.8 07/01/2017   HGB 15.4 07/01/2017   HCT 47.4 07/01/2017   PLT 290.0 07/01/2017   GLUCOSE 80 10/02/2017   CHOL 149 07/01/2017   TRIG 101.0 07/01/2017   HDL 28.40 (L) 07/01/2017   LDLDIRECT 101.0 10/24/2015   LDLCALC 101 (H) 07/01/2017   ALT 21 07/01/2017   AST 25 07/01/2017   NA 139 10/02/2017   K 4.0 10/02/2017   CL 96 10/02/2017   CREATININE 1.16 10/02/2017   BUN 10 10/02/2017   CO2 28 10/02/2017   TSH 1.25 07/01/2017   PSA 0.61 07/01/2017   INR 1.1 (H) 08/01/2016   HGBA1C 5.0 07/04/2016    Echo 05/16/15 reviewed EF 30-35% Cath 05/17/15  Stable disease   Prox RCA lesion, 25% stenosed.  Mid RCA lesion, 99% stenosed.  Dist RCA lesion, 100% stenosed.  Mid Cx to Dist Cx lesion, 40% stenosed.  Prox LAD to Mid LAD lesion, 10% stenosed. The lesion was previously treated with a stent (unknown type).  Dist LAD lesion, 30% stenosed.  No LAD stent restenosis No progression of moderate circumflex disease Progression of mid RCA disease but with known CTO distal RCA And left to right collaterals no indication to intervene on mid vessel That would only supply small RV branch Patient not having chest  pain Indication for cath was multiple AICD d/c's   Stable ischemic DCM  ROS: Denies fever, malais, weight loss, blurry vision, decreased visual acuity, cough, sputum, SOB, hemoptysis, pleuritic pain, palpitaitons, heartburn, abdominal pain, melena, lower extremity edema, claudication, or rash.  All other systems reviewed and negative  General: Filed Weights   11/10/17 1044  Weight: 217 lb 8 oz (98.7 kg)   Affect appropriate Healthy:  appears stated age 80: normal Neck supple with no adenopathy JVP normal no bruits no thyromegaly Lungs clear with no wheezing and good diaphragmatic motion Heart:  S1/S2 no murmur, no rub, gallop or click PMI enlarged AICD under left  clavicle  Abdomen: benighn, BS positve, no tenderness, no AAA no bruit.  No HSM or HJR Distal pulses intact with no bruits No edema Neuro non-focal Skin warm and dry No muscular weakness   Current Outpatient Medications  Medication Sig Dispense Refill  . albuterol (PROAIR HFA) 108 (90 Base) MCG/ACT inhaler Inhale 1-2 puffs into the lungs every 6 (six) hours as needed for wheezing or shortness of breath. 1 Inhaler 0  . ALPRAZolam (XANAX) 0.5 MG tablet Take 1 tablet (0.5 mg total) by mouth daily as needed for anxiety. 30 tablet 2  . aspirin EC 81 MG tablet Take 81 mg by mouth daily.    [name] atorvastatin (LIPITOR) 20 MG tablet Take 1 tablet (20 mg total) by mouth daily. 90 tablet 3  . budesonide-formoterol (SYMBICORT) 80-4.5 MCG/ACT inhaler Inhale 2 puffs into the lungs 2 (two) times daily as needed (wheezing). 1 Inhaler 11  . carvedilol (COREG) 6.25 MG tablet TAKE 1 TABLET BY MOUTH TWICE A DAY 180 tablet 1  . Coenzyme Q10 (CO Q 10) 100 MG CAPS Take 100 mg by mouth daily.     [name] ENTRESTO 24-26 MG TAKE 1 TABLET BY MOUTH TWICE A DAY 180 tablet 3  . fluticasone (FLONASE) 50 MCG/ACT nasal spray PLACE 2 SPRAYS INTO BOTH NOSTRILS DAILY AS NEEDED (SEASONAL ALLERGIES). 16 g 3  . KLOR-CON M20 20 MEQ tablet TAKE 2 TABLETS BY MOUTH EVERY DAY 180 tablet 3  . metolazone (ZAROXOLYN) 5 MG tablet TAKE 1 TABLET (5 MG TOTAL) BY MOUTH DAILY AS NEEDED (FOR EDEMA). 30 tablet 9  . mexiletine (MEXITIL) 200 MG capsule Take 1 capsule (200 mg total) by mouth 2 (two) times daily. 60 capsule 7  . nitroGLYCERIN (NITROSTAT) 0.4 MG SL tablet Place 1 tablet (0.4 mg total) under the tongue every 5 (five) minutes as needed for chest pain. 90 tablet 3  . spironolactone (ALDACTONE) 25 MG tablet Take 0.5 tablets (12.5 mg total) by mouth daily. 45 tablet 3  . tiZANidine (ZANAFLEX) 4 MG tablet TAKE 1 TABLET BY MOUTH EVERY 6 HOURS AS NEEDED SPASMS OF THE MUSCLE 60 tablet 2  . torsemide (DEMADEX) 20 MG tablet Take 2 tablets (40 mg  total) by mouth 2 (two) times daily. 120 tablet 11  . traMADol (ULTRAM) 50 MG tablet Take 1 tablet (50 mg total) by mouth every 8 (eight) hours as needed. 30 tablet 0   Current Facility-Administered Medications  Medication Dose Route Frequency Provider Last Rate Last Dose  . ipratropium-albuterol (DUONEB) 0.5-2.5 (3) MG/3ML nebulizer solution 3 mL  3 mL Nebulization Q6H Nche, Charlene Brooke, NP   3 mL at 04/15/17 1320   Facility-Administered Medications Ordered in Other Visits  Medication Dose Route Frequency Provider Last Rate Last Dose  . perflutren lipid microspheres (DEFINITY) IV suspension  1-10 mL Intravenous PRN [name], MD  1 mL at 11/10/17 1010    Allergies  Levaquin [levofloxacin]; Ace inhibitors; Crestor [rosuvastatin]; Lunesta [eszopiclone]; and Zocor [simvastatin]  Electrocardiogram:  05/09/15  NSR rate 74  biatrial enlargement old anterior MI   QT 390    Assessment and Plan VT/AICD: St Jude single lead. PVCls suppressed well with mexiletine   RFX:JOIT  05/17/15 Mid RCA occluded left to right collaterals LAD stent patent Medical Rx recent myovue 10/16/17 no ischemia old large anterior MI but EF estimated 25% see below  TTE today EF 30-35% will arrange right and left heart cath due to new symptoms and decreased EF by myovue Risks discussed willing to proceed   CHF: Previous EF July 2018 TTE 40-45% Estimated myovue August 2019 25% unable to do MRI with AICD TTE today suggests fall to 30-35% continue meds right heart cath ? 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

## 2017-11-03 NOTE — H&P (View-Only) (Signed)
Patient ID: Jason Hogan, male   DOB: 06-05-1975, 42 y.o.   MRN: [id number]    Jason Hogan is seen todya for F/U of CAD with large Anterior M.I. 2011  and collateralized silent distal RCA occlusion. 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 Has been seeing Jason Hogan at [facility] Less than 2% PVC burden last EP visit October 2018 with mexiletine   Also seen by ENT there 09/2014 for epistaxis with negative evaluation.  Seen by urology for Low T on clomid but caused irritability and then anastrazole  With improvement in fatigue and libido  Did not tolerate aldactone post m.i. with excessive fatiigue and also pruritis but on it now  Seen by Dr Jason Hogan and Jason Hogan for polyarthragia and hematuria.  Noted labs done 07/17/16 with ESR 88 and positive ANA UA with large Hb has had stones in past Cr stable .57  Malaise started beginning of May was Rx with levaquin but felt worse  Reviewed CT abdomen 07/05/16 normal no etiology for hematuria   He feels much better on prednisone taper demedex doing better job diuresing  Needs to be re evaluated by rheumatology for persistent elevation in ESR and  polymyalgia  Has right shoulder issue Can't have MRI to see if rotator cuff or labrum Seeing Dr Jason Nordmann NP August 15 with atypical chest pain in setting of lifting weights and chronic lower back pain Some dysphagia to meat myovue reviewed from 10/16/17 EF estimated at 25% large anterior MI no ischemia Was to have f/u TTE to validate lower EF Has been on entresto, coreg and aldactone as well as demadex.   Having more exertional dyspnea and some chest tightness. This despite good diet weight loss And  compliance with meds TTE from today suggests EF 30-35%  Lab Results  Component Value Date   WBC 7.8 07/01/2017   HGB 15.4 07/01/2017   HCT 47.4 07/01/2017   PLT 290.0 07/01/2017   GLUCOSE 80 10/02/2017   CHOL 149 07/01/2017   TRIG 101.0 07/01/2017   HDL 28.40 (L) 07/01/2017   LDLDIRECT 101.0 10/24/2015   LDLCALC 101 (H) 07/01/2017   ALT 21 07/01/2017   AST 25 07/01/2017   NA 139 10/02/2017   K 4.0 10/02/2017   CL 96 10/02/2017   CREATININE 1.16 10/02/2017   BUN 10 10/02/2017   CO2 28 10/02/2017   TSH 1.25 07/01/2017   PSA 0.61 07/01/2017   INR 1.1 (H) 08/01/2016   HGBA1C 5.0 07/04/2016    Echo 05/16/15 reviewed EF 30-35% Cath 05/17/15  Stable disease   Prox RCA lesion, 25% stenosed.  Mid RCA lesion, 99% stenosed.  Dist RCA lesion, 100% stenosed.  Mid Cx to Dist Cx lesion, 40% stenosed.  Prox LAD to Mid LAD lesion, 10% stenosed. The lesion was previously treated with a stent (unknown type).  Dist LAD lesion, 30% stenosed.  No LAD stent restenosis No progression of moderate circumflex disease Progression of mid RCA disease but with known CTO distal RCA And left to right collaterals no indication to intervene on mid vessel That would only supply small RV branch Patient not having chest  pain Indication for cath was multiple AICD d/c's   Stable ischemic DCM  ROS: Denies fever, malais, weight loss, blurry vision, decreased visual acuity, cough, sputum, SOB, hemoptysis, pleuritic pain, palpitaitons, heartburn, abdominal pain, melena, lower extremity edema, claudication, or rash.  All other systems reviewed and negative  General: Filed Weights   11/10/17 1044  Weight: 217 lb 8 oz (98.7 kg)   Affect appropriate Healthy:  appears stated age 80: normal Neck supple with no adenopathy JVP normal no bruits no thyromegaly Lungs clear with no wheezing and good diaphragmatic motion Heart:  S1/S2 no murmur, no rub, gallop or click PMI enlarged AICD under left  clavicle  Abdomen: benighn, BS positve, no tenderness, no AAA no bruit.  No HSM or HJR Distal pulses intact with no bruits No edema Neuro non-focal Skin warm and dry No muscular weakness   Current Outpatient Medications  Medication Sig Dispense Refill  . albuterol (PROAIR HFA) 108 (90 Base) MCG/ACT inhaler Inhale 1-2 puffs into the lungs every 6 (six) hours as needed for wheezing or shortness of breath. 1 Inhaler 0  . ALPRAZolam (XANAX) 0.5 MG tablet Take 1 tablet (0.5 mg total) by mouth daily as needed for anxiety. 30 tablet 2  . aspirin EC 81 MG tablet Take 81 mg by mouth daily.    [name] atorvastatin (LIPITOR) 20 MG tablet Take 1 tablet (20 mg total) by mouth daily. 90 tablet 3  . budesonide-formoterol (SYMBICORT) 80-4.5 MCG/ACT inhaler Inhale 2 puffs into the lungs 2 (two) times daily as needed (wheezing). 1 Inhaler 11  . carvedilol (COREG) 6.25 MG tablet TAKE 1 TABLET BY MOUTH TWICE A DAY 180 tablet 1  . Coenzyme Q10 (CO Q 10) 100 MG CAPS Take 100 mg by mouth daily.     [name] ENTRESTO 24-26 MG TAKE 1 TABLET BY MOUTH TWICE A DAY 180 tablet 3  . fluticasone (FLONASE) 50 MCG/ACT nasal spray PLACE 2 SPRAYS INTO BOTH NOSTRILS DAILY AS NEEDED (SEASONAL ALLERGIES). 16 g 3  . KLOR-CON M20 20 MEQ tablet TAKE 2 TABLETS BY MOUTH EVERY DAY 180 tablet 3  . metolazone (ZAROXOLYN) 5 MG tablet TAKE 1 TABLET (5 MG TOTAL) BY MOUTH DAILY AS NEEDED (FOR EDEMA). 30 tablet 9  . mexiletine (MEXITIL) 200 MG capsule Take 1 capsule (200 mg total) by mouth 2 (two) times daily. 60 capsule 7  . nitroGLYCERIN (NITROSTAT) 0.4 MG SL tablet Place 1 tablet (0.4 mg total) under the tongue every 5 (five) minutes as needed for chest pain. 90 tablet 3  . spironolactone (ALDACTONE) 25 MG tablet Take 0.5 tablets (12.5 mg total) by mouth daily. 45 tablet 3  . tiZANidine (ZANAFLEX) 4 MG tablet TAKE 1 TABLET BY MOUTH EVERY 6 HOURS AS NEEDED SPASMS OF THE MUSCLE 60 tablet 2  . torsemide (DEMADEX) 20 MG tablet Take 2 tablets (40 mg  total) by mouth 2 (two) times daily. 120 tablet 11  . traMADol (ULTRAM) 50 MG tablet Take 1 tablet (50 mg total) by mouth every 8 (eight) hours as needed. 30 tablet 0   Current Facility-Administered Medications  Medication Dose Route Frequency Provider Last Rate Last Dose  . ipratropium-albuterol (DUONEB) 0.5-2.5 (3) MG/3ML nebulizer solution 3 mL  3 mL Nebulization Q6H Nche, Charlene Brooke, NP   3 mL at 04/15/17 1320   Facility-Administered Medications Ordered in Other Visits  Medication Dose Route Frequency Provider Last Rate Last Dose  . perflutren lipid microspheres (DEFINITY) IV suspension  1-10 mL Intravenous PRN [name], MD  1 mL at 11/10/17 1010    Allergies  Levaquin [levofloxacin]; Ace inhibitors; Crestor [rosuvastatin]; Lunesta [eszopiclone]; and Zocor [simvastatin]  Electrocardiogram:  05/09/15  NSR rate 74  biatrial enlargement old anterior MI   QT 390    Assessment and Plan VT/AICD: St Jude single lead. PVCls suppressed well with mexiletine   RFX:JOIT  05/17/15 Mid RCA occluded left to right collaterals LAD stent patent Medical Rx recent myovue 10/16/17 no ischemia old large anterior MI but EF estimated 25% see below  TTE today EF 30-35% will arrange right and left heart cath due to new symptoms and decreased EF by myovue Risks discussed willing to proceed   CHF: Previous EF July 2018 TTE 40-45% Estimated myovue August 2019 25% unable to do MRI with AICD TTE today suggests fall to 30-35% continue meds right heart cath ? 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

## 2017-11-06 ENCOUNTER — Ambulatory Visit (INDEPENDENT_AMBULATORY_CARE_PROVIDER_SITE_OTHER): Payer: Self-pay | Admitting: *Deleted

## 2017-11-06 DIAGNOSIS — I5022 Chronic systolic (congestive) heart failure: Secondary | ICD-10-CM

## 2017-11-06 DIAGNOSIS — I255 Ischemic cardiomyopathy: Secondary | ICD-10-CM

## 2017-11-06 NOTE — Progress Notes (Signed)
Remote ICD transmission.   

## 2017-11-10 ENCOUNTER — Ambulatory Visit (INDEPENDENT_AMBULATORY_CARE_PROVIDER_SITE_OTHER): Payer: Self-pay | Admitting: Cardiovascular Disease

## 2017-11-10 ENCOUNTER — Other Ambulatory Visit: Payer: Self-pay

## 2017-11-10 ENCOUNTER — Ambulatory Visit (HOSPITAL_COMMUNITY): Payer: Self-pay | Attending: Cardiovascular Disease

## 2017-11-10 ENCOUNTER — Other Ambulatory Visit: Payer: Self-pay | Admitting: Cardiovascular Disease

## 2017-11-10 ENCOUNTER — Encounter: Payer: Self-pay | Admitting: Cardiovascular Disease

## 2017-11-10 VITALS — BP 137/90

## 2017-11-10 VITALS — BP 118/84 | HR 75 | Ht 74.0 in | Wt 217.5 lb

## 2017-11-10 DIAGNOSIS — R0789 Other chest pain: Secondary | ICD-10-CM | POA: Insufficient documentation

## 2017-11-10 DIAGNOSIS — I5022 Chronic systolic (congestive) heart failure: Secondary | ICD-10-CM

## 2017-11-10 DIAGNOSIS — R079 Chest pain, unspecified: Secondary | ICD-10-CM

## 2017-11-10 DIAGNOSIS — I429 Cardiomyopathy, unspecified: Secondary | ICD-10-CM | POA: Insufficient documentation

## 2017-11-10 DIAGNOSIS — E785 Hyperlipidemia, unspecified: Secondary | ICD-10-CM | POA: Insufficient documentation

## 2017-11-10 DIAGNOSIS — I252 Old myocardial infarction: Secondary | ICD-10-CM | POA: Insufficient documentation

## 2017-11-10 DIAGNOSIS — Z01818 Encounter for other preprocedural examination: Secondary | ICD-10-CM

## 2017-11-10 DIAGNOSIS — I209 Angina pectoris, unspecified: Secondary | ICD-10-CM

## 2017-11-10 DIAGNOSIS — I071 Rheumatic tricuspid insufficiency: Secondary | ICD-10-CM | POA: Insufficient documentation

## 2017-11-10 DIAGNOSIS — I371 Nonrheumatic pulmonary valve insufficiency: Secondary | ICD-10-CM | POA: Insufficient documentation

## 2017-11-10 LAB — BASIC METABOLIC PANEL
BUN/Creatinine Ratio: 16 (ref 9–20)
BUN: 19 mg/dL (ref 6–24)
CO2: 24 mmol/L (ref 20–29)
Calcium: 9.6 mg/dL (ref 8.7–10.2)
Chloride: 96 mmol/L (ref 96–106)
Creatinine, Ser: 1.22 mg/dL (ref 0.76–1.27)
GFR calc Af Amer: 85 mL/min/{1.73_m2} (ref >59)
GFR calc non Af Amer: 73 mL/min/{1.73_m2} (ref >59)
GLUCOSE: 76 mg/dL (ref 65–99)
Potassium: 4.2 mmol/L (ref 3.5–5.2)
SODIUM: 138 mmol/L (ref 134–144)

## 2017-11-10 LAB — CBC WITH DIFFERENTIAL/PLATELET
Basophils Absolute: 0 10*3/uL (ref 0.0–0.2)
Basos: 0 %
EOS (ABSOLUTE): 0.3 10*3/uL (ref 0.0–0.4)
EOS: 3 %
HEMATOCRIT: 45.4 % (ref 37.5–51.0)
Hemoglobin: 14.8 g/dL (ref 13.0–17.7)
IMMATURE GRANULOCYTES: 0 %
Immature Grans (Abs): 0 10*3/uL (ref 0.0–0.1)
LYMPHS ABS: 2.1 10*3/uL (ref 0.7–3.1)
Lymphs: 26 %
MCH: 26.6 pg (ref 26.6–33.0)
MCHC: 32.6 g/dL (ref 31.5–35.7)
MCV: 82 fL (ref 79–97)
MONOCYTES: 8 %
MONOS ABS: 0.7 10*3/uL (ref 0.1–0.9)
Neutrophils Absolute: 5 10*3/uL (ref 1.4–7.0)
Neutrophils: 63 %
Platelets: 267 10*3/uL (ref 150–450)
RBC: 5.57 x10E6/uL (ref 4.14–5.80)
RDW: 14.7 % (ref 12.3–15.4)
WBC: 8 10*3/uL (ref 3.4–10.8)

## 2017-11-10 MED ORDER — PERFLUTREN LIPID MICROSPHERE
1.0000 mL | INTRAVENOUS | Status: AC | PRN
  Administered 2017-11-10: 1 mL via INTRAVENOUS

## 2017-11-10 NOTE — Patient Instructions (Signed)
Medication Instructions:  Your physician recommends that you continue on your current medications as directed. Please refer to the Current Medication list given to you today.  Labwork: Your physician recommends that you have lab work today- BMET and CBC.   Testing/Procedures: NONE  Follow-Up: Your physician wants you to follow-up in: 2 weeks with PA or NP.   If you need a refill on your cardiac medications before your next appointment, please call your pharmacy.     [facility] CARDIOVASCULAR DIVISION [facility] ST OFFICE [address] [address] Dept: [phone] Loc: [phone]  Jason Hogan  11/10/2017  You are scheduled for a Cardiac Catheterization on Friday, September 27 with Dr. Verne Carrow.  1. Please arrive at the [facility] (Main Entrance A) at [facility]: [address] [place], [place] [address] at 10:00 AM (This time is two hours before your procedure to ensure your preparation). Free valet parking service is available.   Special note: Every effort is made to have your procedure done on time. Please understand that emergencies sometimes delay scheduled procedures.  2. Diet: Do not eat solid foods after midnight.  The patient may have clear liquids until 5am upon the day of the procedure.  3. Labs: You will need to have blood drawn on Monday, September 23 at [facility] at [facility]. [address], [place]  Open: 7:30am - 5pm    Phone: [phone]. You do not need to be fasting.  4. Medication instructions in preparation for your procedure:   Contrast Allergy: No  Stop taking, Torsemide (Demadex) on Friday, September 27.   Stop taking, Metolazone and Spironolactone on Friday, September 27.   On the morning of your procedure, take your Aspirin and any morning medicines NOT listed above.  You may use sips of water.  5. Plan for one night stay--bring  personal belongings. 6. Bring a current list of your medications and current insurance cards. 7. You MUST have a responsible person to drive you home. 8. Someone MUST be with you the first 24 hours after you arrive home or your discharge will be delayed. 9. Please wear clothes that are easy to get on and off and wear slip-on shoes.  Thank you for allowing [place] to care for you!   --  Invasive Cardiovascular services

## 2017-11-11 ENCOUNTER — Encounter: Payer: Self-pay | Admitting: Cardiology

## 2017-11-13 ENCOUNTER — Telehealth: Payer: Self-pay | Admitting: *Deleted

## 2017-11-13 NOTE — Telephone Encounter (Signed)
Pt contacted pre-catheterization scheduled at [facility] for: Friday November 14, 2017 12 noon Verified arrival time and place: [facility] Main Entrance A at: 10 AM  No solid food after midnight prior to cath, clear liquids until 5 AM day of procedure. Verified no contrast allergy Verified no diabetes medications.  Hold: Torsemide-AM of procedure. KCl -AM of procedure  Except hold medications AM meds can be  taken pre-cath with sip of water including: ASA 81 mg  Confirmed patient has responsible person to drive home post procedure and for 24 hours after you arrive home: yes

## 2017-11-14 ENCOUNTER — Encounter (HOSPITAL_COMMUNITY): Payer: Self-pay | Admitting: Cardiovascular Disease

## 2017-11-14 ENCOUNTER — Other Ambulatory Visit: Payer: Self-pay

## 2017-11-14 ENCOUNTER — Ambulatory Visit (HOSPITAL_COMMUNITY)
Admission: RE | Admit: 2017-11-14 | Discharge: 2017-11-15 | Disposition: A | Discharge status: Home / Self Care | Payer: Self-pay | Source: Ambulatory Visit | Attending: Cardiovascular Disease | Admitting: Cardiovascular Disease

## 2017-11-14 ENCOUNTER — Ambulatory Visit (HOSPITAL_COMMUNITY)
Admission: RE | Disposition: A | Discharge status: Home / Self Care | Payer: Self-pay | Source: Ambulatory Visit | Attending: Cardiovascular Disease

## 2017-11-14 DIAGNOSIS — Z9861 Coronary angioplasty status: Secondary | ICD-10-CM

## 2017-11-14 DIAGNOSIS — Z881 Allergy status to other antibiotic agents status: Secondary | ICD-10-CM | POA: Insufficient documentation

## 2017-11-14 DIAGNOSIS — N259 Disorder resulting from impaired renal tubular function, unspecified: Secondary | ICD-10-CM | POA: Diagnosis present

## 2017-11-14 DIAGNOSIS — I110 Hypertensive heart disease with heart failure: Secondary | ICD-10-CM | POA: Insufficient documentation

## 2017-11-14 DIAGNOSIS — J4521 Mild intermittent asthma with (acute) exacerbation: Secondary | ICD-10-CM

## 2017-11-14 DIAGNOSIS — I200 Unstable angina: Secondary | ICD-10-CM

## 2017-11-14 DIAGNOSIS — I25110 Atherosclerotic heart disease of native coronary artery with unstable angina pectoris: Secondary | ICD-10-CM

## 2017-11-14 DIAGNOSIS — Z955 Presence of coronary angioplasty implant and graft: Secondary | ICD-10-CM | POA: Insufficient documentation

## 2017-11-14 DIAGNOSIS — Z7982 Long term (current) use of aspirin: Secondary | ICD-10-CM | POA: Insufficient documentation

## 2017-11-14 DIAGNOSIS — I209 Angina pectoris, unspecified: Secondary | ICD-10-CM

## 2017-11-14 DIAGNOSIS — Z95810 Presence of automatic (implantable) cardiac defibrillator: Secondary | ICD-10-CM | POA: Diagnosis present

## 2017-11-14 DIAGNOSIS — I252 Old myocardial infarction: Secondary | ICD-10-CM | POA: Insufficient documentation

## 2017-11-14 DIAGNOSIS — E7800 Pure hypercholesterolemia, unspecified: Secondary | ICD-10-CM | POA: Diagnosis present

## 2017-11-14 DIAGNOSIS — Z888 Allergy status to other drugs, medicaments and biological substances status: Secondary | ICD-10-CM | POA: Insufficient documentation

## 2017-11-14 DIAGNOSIS — Z79899 Other long term (current) drug therapy: Secondary | ICD-10-CM | POA: Insufficient documentation

## 2017-11-14 DIAGNOSIS — Z23 Encounter for immunization: Secondary | ICD-10-CM | POA: Insufficient documentation

## 2017-11-14 DIAGNOSIS — I2510 Atherosclerotic heart disease of native coronary artery without angina pectoris: Secondary | ICD-10-CM

## 2017-11-14 DIAGNOSIS — I5022 Chronic systolic (congestive) heart failure: Secondary | ICD-10-CM | POA: Diagnosis present

## 2017-11-14 HISTORY — PX: CORONARY STENT INTERVENTION: CATH118234

## 2017-11-14 HISTORY — PX: RIGHT/LEFT HEART CATH AND CORONARY ANGIOGRAPHY: CATH118266

## 2017-11-14 LAB — POCT I-STAT 3, ART BLOOD GAS (G3+)
Acid-base deficit: 2 mmol/L (ref 0.0–2.0)
BICARBONATE: 24.3 mmol/L (ref 20.0–28.0)
O2 SAT: 98 %
PCO2 ART: 44.9 mmHg (ref 32.0–48.0)
PO2 ART: 111 mmHg — AB (ref 83.0–108.0)
TCO2: 26 mmol/L (ref 22–32)
pH, Arterial: 7.342 — ABNORMAL LOW (ref 7.350–7.450)

## 2017-11-14 LAB — POCT I-STAT 3, VENOUS BLOOD GAS (G3P V)
Acid-base deficit: 5 mmol/L — ABNORMAL HIGH (ref 0.0–2.0)
BICARBONATE: 20.6 mmol/L (ref 20.0–28.0)
O2 Saturation: 78 %
TCO2: 22 mmol/L (ref 22–32)
pCO2, Ven: 38.4 mmHg — ABNORMAL LOW (ref 44.0–60.0)
pH, Ven: 7.337 (ref 7.250–7.430)
pO2, Ven: 45 mmHg (ref 32.0–45.0)

## 2017-11-14 LAB — POCT ACTIVATED CLOTTING TIME
ACTIVATED CLOTTING TIME: 290 s
ACTIVATED CLOTTING TIME: 290 s

## 2017-11-14 SURGERY — RIGHT/LEFT HEART CATH AND CORONARY ANGIOGRAPHY
Anesthesia: LOCAL

## 2017-11-14 MED ORDER — SODIUM CHLORIDE 0.9% FLUSH: 3.0000 mL | INTRAVENOUS | Status: DC | PRN

## 2017-11-14 MED ORDER — HEPARIN SODIUM (PORCINE) 1000 UNIT/ML IJ SOLN
INTRAMUSCULAR | Status: DC | PRN
  Administered 2017-11-14 (×2): 2000 [IU] via INTRAVENOUS
  Administered 2017-11-14 (×2): 5000 [IU] via INTRAVENOUS

## 2017-11-14 MED ORDER — LIDOCAINE HCL (PF) 1 % IJ SOLN
INTRAMUSCULAR | Status: AC
  Filled 2017-11-14: qty 30

## 2017-11-14 MED ORDER — ALPRAZOLAM 0.5 MG PO TABS: 0.5000 mg | ORAL_TABLET | Freq: Every day | ORAL | Status: DC | PRN

## 2017-11-14 MED ORDER — CARVEDILOL 3.125 MG PO TABS
6.2500 mg | ORAL_TABLET | Freq: Two times a day (BID) | ORAL | Status: DC
  Administered 2017-11-14 – 2017-11-15 (×2): 6.25 mg via ORAL
  Filled 2017-11-14 (×2): qty 2

## 2017-11-14 MED ORDER — SODIUM CHLORIDE 0.9% FLUSH: 3.0000 mL | Freq: Two times a day (BID) | INTRAVENOUS | Status: DC

## 2017-11-14 MED ORDER — SODIUM CHLORIDE 0.9 % IV SOLN: 250.0000 mL | INTRAVENOUS | Status: DC | PRN

## 2017-11-14 MED ORDER — MEXILETINE HCL 200 MG PO CAPS
200.0000 mg | ORAL_CAPSULE | Freq: Two times a day (BID) | ORAL | Status: DC
  Administered 2017-11-14 – 2017-11-15 (×2): 200 mg via ORAL
  Filled 2017-11-14 (×2): qty 1

## 2017-11-14 MED ORDER — VERAPAMIL HCL 2.5 MG/ML IV SOLN
INTRAVENOUS | Status: DC | PRN
  Administered 2017-11-14: 10 mL via INTRA_ARTERIAL

## 2017-11-14 MED ORDER — IOHEXOL 350 MG/ML SOLN
INTRAVENOUS | Status: DC | PRN
  Administered 2017-11-14: 175 mL via INTRA_ARTERIAL

## 2017-11-14 MED ORDER — SODIUM CHLORIDE 0.9 % IV SOLN: INTRAVENOUS | Status: AC

## 2017-11-14 MED ORDER — SPIRONOLACTONE 12.5 MG HALF TABLET
12.5000 mg | ORAL_TABLET | Freq: Every evening | ORAL | Status: DC
  Filled 2017-11-14 (×2): qty 1

## 2017-11-14 MED ORDER — POTASSIUM CHLORIDE CRYS ER 20 MEQ PO TBCR
40.0000 meq | EXTENDED_RELEASE_TABLET | Freq: Every day | ORAL | Status: DC
  Administered 2017-11-15: 40 meq via ORAL
  Filled 2017-11-14: qty 2

## 2017-11-14 MED ORDER — SACUBITRIL-VALSARTAN 24-26 MG PO TABS
1.0000 | ORAL_TABLET | Freq: Two times a day (BID) | ORAL | Status: DC
  Administered 2017-11-14 – 2017-11-15 (×2): 1 via ORAL
  Filled 2017-11-14 (×2): qty 1

## 2017-11-14 MED ORDER — ACETAMINOPHEN 325 MG PO TABS: 650.0000 mg | ORAL_TABLET | ORAL | Status: DC | PRN

## 2017-11-14 MED ORDER — INFLUENZA VAC SPLIT QUAD 0.5 ML IM SUSY
0.5000 mL | PREFILLED_SYRINGE | INTRAMUSCULAR | Status: AC
  Administered 2017-11-15: 0.5 mL via INTRAMUSCULAR
  Filled 2017-11-14: qty 0.5

## 2017-11-14 MED ORDER — PRASUGREL HCL 10 MG PO TABS
ORAL_TABLET | ORAL | Status: AC
  Filled 2017-11-14: qty 1

## 2017-11-14 MED ORDER — PRASUGREL HCL 10 MG PO TABS
10.0000 mg | ORAL_TABLET | Freq: Every day | ORAL | Status: DC
  Administered 2017-11-15: 10 mg via ORAL
  Filled 2017-11-14: qty 1

## 2017-11-14 MED ORDER — VERAPAMIL HCL 2.5 MG/ML IV SOLN
INTRAVENOUS | Status: AC
  Filled 2017-11-14: qty 2

## 2017-11-14 MED ORDER — MIDAZOLAM HCL 2 MG/2ML IJ SOLN
INTRAMUSCULAR | Status: AC
  Filled 2017-11-14: qty 2

## 2017-11-14 MED ORDER — SODIUM CHLORIDE 0.9 % WEIGHT BASED INFUSION: 1.0000 mL/kg/h | INTRAVENOUS | Status: DC

## 2017-11-14 MED ORDER — ATORVASTATIN CALCIUM 20 MG PO TABS
20.0000 mg | ORAL_TABLET | Freq: Every day | ORAL | Status: DC
  Administered 2017-11-15: 20 mg via ORAL
  Filled 2017-11-14: qty 1

## 2017-11-14 MED ORDER — SODIUM CHLORIDE 0.9 % WEIGHT BASED INFUSION
3.0000 mL/kg/h | INTRAVENOUS | Status: DC
  Administered 2017-11-14: 3 mL/kg/h via INTRAVENOUS

## 2017-11-14 MED ORDER — FENTANYL CITRATE (PF) 100 MCG/2ML IJ SOLN
INTRAMUSCULAR | Status: AC
  Filled 2017-11-14: qty 2

## 2017-11-14 MED ORDER — FENTANYL CITRATE (PF) 100 MCG/2ML IJ SOLN
INTRAMUSCULAR | Status: DC | PRN
  Administered 2017-11-14: 25 ug via INTRAVENOUS
  Administered 2017-11-14: 50 ug via INTRAVENOUS
  Administered 2017-11-14: 25 ug via INTRAVENOUS

## 2017-11-14 MED ORDER — ASPIRIN EC 81 MG PO TBEC
81.0000 mg | DELAYED_RELEASE_TABLET | Freq: Every day | ORAL | Status: DC
  Administered 2017-11-15: 81 mg via ORAL
  Filled 2017-11-14: qty 1

## 2017-11-14 MED ORDER — LABETALOL HCL 5 MG/ML IV SOLN: 10.0000 mg | INTRAVENOUS | Status: AC | PRN

## 2017-11-14 MED ORDER — ANGIOPLASTY BOOK
Freq: Once | Status: AC
  Administered 2017-11-14: 1
  Filled 2017-11-14: qty 1

## 2017-11-14 MED ORDER — CO Q 10 100 MG PO CAPS: 100.0000 mg | ORAL_CAPSULE | Freq: Every day | ORAL | Status: DC

## 2017-11-14 MED ORDER — SODIUM CHLORIDE 0.9% FLUSH
3.0000 mL | Freq: Two times a day (BID) | INTRAVENOUS | Status: DC
  Administered 2017-11-14: 3 mL via INTRAVENOUS

## 2017-11-14 MED ORDER — ONDANSETRON HCL 4 MG/2ML IJ SOLN: 4.0000 mg | Freq: Four times a day (QID) | INTRAMUSCULAR | Status: DC | PRN

## 2017-11-14 MED ORDER — PRASUGREL HCL 10 MG PO TABS
ORAL_TABLET | ORAL | Status: DC | PRN
  Administered 2017-11-14: 60 mg via ORAL

## 2017-11-14 MED ORDER — ASPIRIN 81 MG PO CHEW: 81.0000 mg | CHEWABLE_TABLET | ORAL | Status: DC

## 2017-11-14 MED ORDER — MIDAZOLAM HCL 2 MG/2ML IJ SOLN
INTRAMUSCULAR | Status: DC | PRN
  Administered 2017-11-14: 2 mg via INTRAVENOUS
  Administered 2017-11-14 (×2): 1 mg via INTRAVENOUS

## 2017-11-14 MED ORDER — LIDOCAINE HCL (PF) 1 % IJ SOLN
INTRAMUSCULAR | Status: DC | PRN
  Administered 2017-11-14: 2 mL

## 2017-11-14 MED ORDER — HYDRALAZINE HCL 20 MG/ML IJ SOLN: 5.0000 mg | INTRAMUSCULAR | Status: AC | PRN

## 2017-11-14 MED ORDER — HEPARIN (PORCINE) IN NACL 1000-0.9 UT/500ML-% IV SOLN
INTRAVENOUS | Status: AC
  Filled 2017-11-14: qty 1000

## 2017-11-14 MED ORDER — NITROGLYCERIN 0.4 MG SL SUBL: 0.4000 mg | SUBLINGUAL_TABLET | SUBLINGUAL | Status: DC | PRN

## 2017-11-14 MED ORDER — NITROGLYCERIN 1 MG/10 ML FOR IR/CATH LAB
INTRA_ARTERIAL | Status: AC
  Filled 2017-11-14: qty 10

## 2017-11-14 MED ORDER — ALBUTEROL SULFATE (2.5 MG/3ML) 0.083% IN NEBU: 2.5000 mg | INHALATION_SOLUTION | Freq: Four times a day (QID) | RESPIRATORY_TRACT | Status: DC | PRN

## 2017-11-14 MED ORDER — TORSEMIDE 20 MG PO TABS
40.0000 mg | ORAL_TABLET | Freq: Two times a day (BID) | ORAL | Status: DC
  Administered 2017-11-15: 09:00:00 40 mg via ORAL
  Filled 2017-11-14 (×2): qty 2

## 2017-11-14 SURGICAL SUPPLY — 24 items
BALLN SAPPHIRE 2.5X12 (BALLOONS) ×4
BALLN SAPPHIRE ~~LOC~~ 3.0X8 (BALLOONS) ×2 IMPLANT
BALLN SAPPHIRE ~~LOC~~ 3.75X15 (BALLOONS) ×2 IMPLANT
BALLN WOLVERINE 3.00X10 (BALLOONS) ×2
BALLOON SAPPHIRE 2.5X12 (BALLOONS) ×2 IMPLANT
BALLOON WOLVERINE 3.00X10 (BALLOONS) ×1 IMPLANT
CATH 5FR JL3.5 JR4 ANG PIG MP (CATHETERS) ×2 IMPLANT
CATH BALLN WEDGE 5F 110CM (CATHETERS) ×2 IMPLANT
CATH INFINITI 5 FR 3DRC (CATHETERS) ×2 IMPLANT
CATH VISTA GUIDE 6FR XBLAD3.5 (CATHETERS) ×2 IMPLANT
DEVICE RAD COMP TR BAND LRG (VASCULAR PRODUCTS) ×2 IMPLANT
GLIDESHEATH SLEND SS 6F .021 (SHEATH) ×2 IMPLANT
GUIDELINER 6F (CATHETERS) ×2 IMPLANT
GUIDEWIRE INQWIRE 1.5J.035X260 (WIRE) ×1 IMPLANT
INQWIRE 1.5J .035X260CM (WIRE) ×2
KIT ENCORE 26 ADVANTAGE (KITS) ×2 IMPLANT
KIT HEART LEFT (KITS) ×2 IMPLANT
PACK CARDIAC CATHETERIZATION (CUSTOM PROCEDURE TRAY) ×2 IMPLANT
SHEATH GLIDE SLENDER 4/5FR (SHEATH) ×2 IMPLANT
STENT SYNERGY DES 3.5X20 (Permanent Stent) ×2 IMPLANT
SYR MEDRAD MARK V 150ML (SYRINGE) ×2 IMPLANT
TRANSDUCER W/STOPCOCK (MISCELLANEOUS) ×2 IMPLANT
TUBING CIL FLEX 10 FLL-RA (TUBING) ×2 IMPLANT
WIRE COUGAR XT STRL 190CM (WIRE) ×4 IMPLANT

## 2017-11-14 NOTE — Care Management Note (Signed)
Case Management Note  Patient Details  Name: Jason Hogan MRN: [id number] Date of Birth: [birth year]  Subjective/Objective:   From home, s/p stent intervention, patient will be on effient and entresto,  Patient states he is working and can afford all his medications, but has no insurance at this time, he still has some effient at home and he has been on entresto he just needs to get it refilled.  He has transportation home tomorrow also .  He has PCP Tresa Endo and plans to continue seeing him.                   Action/Plan: DC home when ready.   Expected Discharge Date:                  Expected Discharge Plan:  Home/Self Care  In-House Referral:     Discharge planning Services  CM Consult  Post Acute Care Choice:    Choice offered to:     DME Arranged:    DME Agency:     HH Arranged:    HH Agency:     Status of Service:  Completed, signed off  If discussed at [employer] of Stay Meetings, dates discussed:    Additional Comments:  Leone Haven, RN 11/14/2017, 3:21 PM

## 2017-11-14 NOTE — Progress Notes (Signed)
PHARMACIST - PHYSICIAN ORDER COMMUNICATION  CONCERNING: P&T Medication Policy on Herbal Medications  DESCRIPTION:  This patient's order for:  Co-Q 10  has been noted.  This product(s) is classified as an "herbal" or natural product. Due to a lack of definitive safety studies or FDA approval, nonstandard manufacturing practices, plus the potential risk of unknown drug-drug interactions while on inpatient medications, the Pharmacy and Therapeutics Committee does not permit the use of "herbal" or natural products of this type within [facility].   ACTION TAKEN: The pharmacy department is unable to verify this order at this time and your patient has been informed of this safety policy. Please reevaluate patient's clinical condition at discharge and address if the herbal or natural product(s) should be resumed at that time.  Girard Cooter, PharmD Clinical Pharmacist  Pager: [phone] Phone: [phone]

## 2017-11-14 NOTE — Interval H&P Note (Signed)
History and Physical Interval Note:  11/14/2017 11:29 AM  Jason Hogan  has presented today for cardiac cath with the diagnosis of CAD with unstable angina, worsened cardiomyopathy.  The various methods of treatment have been discussed with the patient and family. After consideration of risks, benefits and other options for treatment, the patient has consented to  Procedure(s): RIGHT/LEFT HEART CATH AND CORONARY ANGIOGRAPHY (N/A) as a surgical intervention .  The patient's history has been reviewed, patient examined, no change in status, stable for surgery.  I have reviewed the patient's chart and labs.  Questions were answered to the patient's satisfaction.    Cath Lab Visit (complete for each Cath Lab visit)  Clinical Evaluation Leading to the Procedure:   ACS: No.  Non-ACS:    Anginal Classification: CCS III  Anti-ischemic medical therapy: Minimal Therapy (1 class of medications)  Non-Invasive Test Results: High-risk stress test findings: cardiac mortality >3%/year  Prior CABG: No previous CABG         Verne Carrow

## 2017-11-14 NOTE — Progress Notes (Signed)
TR BAND REMOVAL  LOCATION:    right radial  DEFLATED PER PROTOCOL:    Yes.    TIME BAND OFF / DRESSING APPLIED:    1715   SITE UPON ARRIVAL:    Level 0  SITE AFTER BAND REMOVAL:    Level 0  CIRCULATION SENSATION AND MOVEMENT:    Within Normal Limits   Yes.    COMMENTS:  Tolerated prcedure well

## 2017-11-15 ENCOUNTER — Other Ambulatory Visit: Payer: Self-pay | Admitting: Interventional Cardiology

## 2017-11-15 DIAGNOSIS — I200 Unstable angina: Secondary | ICD-10-CM

## 2017-11-15 DIAGNOSIS — I25750 Atherosclerosis of native coronary artery of transplanted heart with unstable angina: Secondary | ICD-10-CM

## 2017-11-15 DIAGNOSIS — E785 Hyperlipidemia, unspecified: Secondary | ICD-10-CM

## 2017-11-15 DIAGNOSIS — I2510 Atherosclerotic heart disease of native coronary artery without angina pectoris: Secondary | ICD-10-CM

## 2017-11-15 LAB — CBC
HCT: 43.7 % (ref 39.0–52.0)
Hemoglobin: 13.8 g/dL (ref 13.0–17.0)
MCH: 26.9 pg (ref 26.0–34.0)
MCHC: 31.6 g/dL (ref 30.0–36.0)
MCV: 85.2 fL (ref 78.0–100.0)
Platelets: 270 10*3/uL (ref 150–400)
RBC: 5.13 MIL/uL (ref 4.22–5.81)
RDW: 14.1 % (ref 11.5–15.5)
WBC: 8.8 10*3/uL (ref 4.0–10.5)

## 2017-11-15 LAB — BASIC METABOLIC PANEL
Anion gap: 8 (ref 5–15)
BUN: 12 mg/dL (ref 6–20)
CO2: 24 mmol/L (ref 22–32)
Calcium: 9.4 mg/dL (ref 8.9–10.3)
Chloride: 106 mmol/L (ref 98–111)
Creatinine, Ser: 1.1 mg/dL (ref 0.61–1.24)
GFR calc Af Amer: >60 mL/min (ref >60)
GLUCOSE: 104 mg/dL — AB (ref 70–99)
POTASSIUM: 4.3 mmol/L (ref 3.5–5.1)
Sodium: 138 mmol/L (ref 135–145)

## 2017-11-15 MED ORDER — ATORVASTATIN CALCIUM 80 MG PO TABS: 80.0000 mg | ORAL_TABLET | Freq: Every day | ORAL | 3 refills | Status: DC

## 2017-11-15 MED ORDER — PRASUGREL HCL 10 MG PO TABS: 10.0000 mg | ORAL_TABLET | Freq: Every day | ORAL | 3 refills | Status: DC

## 2017-11-15 NOTE — Progress Notes (Signed)
CARDIOLOGY PROGRESS NOTE   No chest discomfort or dyspnea this a.m.  Was able to lie flat overnight.  Vital signs are stable with most recent blood pressure 128/87.  Right radial access site without hematoma.  Lungs are clear and cardiac exam reveals no gallop.  Neck veins are flat.  Labs are stable with creatinine 1.1 and hemoglobin is 13.8.  Digital images reviewed demonstrating beautiful circumflex stent result and somewhat recalcitrant high-grade focal obstruction in the distal margin of the LAD stent nicely treated by Dr. Clifton James.  Impression: High-grade obstruction found in the circumflex successfully treated with stent and focal LAD ISR treated with angioplasty.  Both successful.  RECOMMENDATIONS: Discharge today.  Guideline directed therapy for secondary prevention: LDL less than 70 -increase atorvastatin to high intensity 80 mg/day; monitor hemoglobin A1c; greater than 150 minutes of moderate aerobic activity per week.  Also guideline directed therapy for left ventricular systolic dysfunction: Continue Entresto, beta-blocker, and Spironolactone.  Needs fasting liver and lipid panel in 6 weeks.  Follow-up with Dr. Eden Emms or team member in 7 to 10 days.

## 2017-11-15 NOTE — Progress Notes (Signed)
CARDIAC REHAB PHASE I   PRE:  Rate/Rhythm: 78 SR  BP:  Supine:   Sitting: 144/98  Standing:    SaO2:   MODE:  Ambulation: 800 ft   POST:  Rate/Rhythm: 92 SR  BP:  Supine:   Sitting: 144/83  Standing:    SaO2:  0750-0835 Pt walked 800 ft on RA with steady gait and no CP. Tolerated well. Reviewed NTG use, importance of effient with stent, ex ed and gave low sodium and heart healthy diets. Gave CHF booklet due to low EF and reviewed when to call MD, 2000 mg restriction of sodium and daily weights. Pt very knowledgeable and has been exercising and watching diet. Referred to [place] [facility]. He stated he might go this time.    Jason Nutting, RN BSN  11/15/2017 8:30 AM

## 2017-11-15 NOTE — Discharge Summary (Addendum)
The patient has been seen in conjunction with Jason Sharp, PA-C. All aspects of care have been considered and discussed. The patient has been personally interviewed, examined, and all clinical data has been reviewed.   The patient is clinically stable this morning without chest pain, dyspnea, or access site discomfort.  Right radial Access site examined and is without hematoma.  Hemoglobin and kidney function are normal.  The patient should be counseled against using pseudoephedrine.  Continue dual antiplatelet therapy with aspirin and Effient.  1 week follow-up.  Discharge Summary    Patient ID: Jason Hogan MRN: [id number]; DOB: 19-May-1975  Admit date: 11/14/2017 Discharge date: 11/15/2017  Primary Care Provider: [name], MD  Primary Cardiologist: Jason Rouge, MD  Primary Electrophysiologist:  None   Discharge Diagnoses    Principal Problem:   CAD (coronary artery disease) Active Problems:   HYPERCHOLESTEROLEMIA   Chronic systolic heart failure ([place])   Disorder resulting from impaired renal function   Implantable cardioverter-defibrillator (ICD) in situ   Unstable angina ([facility])   Allergies Allergies  Allergen Reactions  . Levaquin [Levofloxacin] Other (See Comments)    Joint pain  . Ace Inhibitors Cough  . Crestor [Rosuvastatin] Other (See Comments)    Sweet craving  . Lunesta [Eszopiclone] Other (See Comments)    Bitter taste  . Zocor [Simvastatin] Other (See Comments)    Memory problem    Diagnostic Studies/Procedures    Left heart cath 11/14/17:  Prox RCA to Mid RCA lesion is 100% stenosed.  Prox Cx lesion is 99% stenosed.  A drug-eluting stent was successfully placed using a STENT SYNERGY DES 3.5X20.  Post intervention, there is a 0% residual stenosis.  Ost LAD to Prox LAD lesion is 70% stenosed.  Balloon angioplasty was performed using a BALLOON SAPPHIRE [place] 3.0X8.  Post intervention, there is a 20% residual stenosis.   1. Patent  LAD stent with focal area of severe narrowing within the stent. Successful PTCA with balloon angioplasty only of the LAD stented segment.  2. Severe stenosis mid Circumflex artery. Successful PTCA/DES x 1 mid Circumflex 3. Chronic occlusion mid RCA. The distal RCA fills from left to right collaterals.   Recommendations:   Recommend uninterrupted dual antiplatelet therapy with Aspirin 86m daily and Effient 10 mg daily for a minimum of 12 months (ACS - Class I recommendation).   Echo 11/10/17: Study Conclusions - Left ventricle: Septal apical and inferior hypokinesis. The   cavity size was moderately dilated. Wall thickness was normal.   Systolic function was moderately to severely reduced. The   estimated ejection fraction was in the range of 30% to 35%. Left   ventricular diastolic function parameters were normal. - Left atrium: The atrium was mildly dilated. - Atrial septum: No defect or patent foramen ovale was identified.   Myoview 10/16/17:  Nuclear stress EF: 25%.  The left ventricular ejection fraction is severely decreased (<30%).  There was no ST segment deviation noted during stress.  Findings consistent with prior myocardial infarction.  This is a high risk study.   1. EF 25%, anterior, anteroseptal, and apical hypokinesis.  2. Fixed large, severe mid to apical anterior, mid to apical anteroseptal, apical inferior, and true apex perfusion defect. This suggests infarction without significant ischemia.   High risk study due to low EF.    History of Present Illness     BMortimer Frieswas seen in clinic for F/U of CAD with large Anterior M.I. in 2011. At that time, he underwent  collateralization of silent distal RCA occlusion. Initially acute MI care was with [facility] who was the on call STEMI doctor. AICD implant complicated by hematoma  Has single chamber device with no counter so unable to assess palpitations. Had care at [facility] for a while and re established with [facility] in  June 2017.   He was seen by [facility] Hogan 11/12/16  after AICD d/c;s  Found to have appropriate ATP and shock for VT. Has been seeing [facility] at [facility] Less than 2% PVC burden last EP visit October 2018 with mexiletine. Also seen by Jason Hogan 09/2014 for epistaxis with negative evaluation.  Seen by urology for Low T on clomid but caused irritability and then anastrazole with improvement in fatigue and libido.   Did not tolerate aldactone post m.i. with excessive fatiigue and also pruritis but on it now. Has been seen by [facility] and [facility] for polyarthragia and hematuria. Noted labs done 07/17/16 with ESR 88 and positive ANA. UA with large Hb has had stones in past Cr stable .88. Malaise started beginning of May was Rx with levaquin but felt worse. Reviewed CT abdomen 07/05/16 normal no etiology for hematuria.   He feels much better on prednisone taper. Demedex doing better job diuresing. Needs to be re evaluated by rheumatology for persistent elevation in ESR and polymyalgia.   Saw NP August 15 with atypical chest pain in setting of lifting weights and chronic lower back pain.  He reported some dysphagia to meat. Myovue reviewed from 10/16/17 revealed EF estimated at 25% large anterior MI no ischemia. Was to have f/u TTE to validate lower EF. Has been on entresto, coreg and aldactone as well as demadex.   Having more exertional dyspnea and some chest tightness. This despite good diet, weight loss, and compliance with meds.  TTE from today suggests EF 30-35%  Hogan Course     Consultants: none  CAD High grade focal obstruction of the distal margin of the LAD ISR treated with PTCA and balloon angioplasty for 100% occlusion in previous stent. Mid Cx stenosis treated with DES. He tolerated the procedure well. Cath site C/D/I. He was discharged on ASA and effient (on prior to this heart cath).   HLD 07/01/2017: Cholesterol 149; HDL 28.40; LDL Cholesterol 101; Triglycerides 101.0; VLDL 20.2 Will  increase lipitor to 80 mg. Will need repeat labs in 6-8 weeks.   HTN Meds as below.   Chronic systolic heart failure EF 30-35%, ICD in place. Will discharge on entresto, beta blocker, and spironolactone. Pt is on room air, no signs of hypervolemia today.   Pt seen and examined by Jason. Tamala Julian and deemed stable for discharge. Follow up has been arranged.    _____________  Discharge Vitals Blood pressure 128/87, pulse 70, temperature 99 F (37.2 C), resp. rate 14, height _0  (1.88 m), weight 98 kg, SpO2 100 %.  Filed Weights   11/14/17 0953 11/15/17 0306  Weight: 95.7 kg 98 kg    Labs & Radiologic Studies    CBC Recent Labs    11/15/17 0248  WBC 8.8  HGB 13.8  HCT 43.7  MCV 85.2  PLT 144   Basic Metabolic Panel Recent Labs    11/15/17 0248  NA 138  K 4.3  CL 106  CO2 24  GLUCOSE 104*  BUN 12  CREATININE 1.10  CALCIUM 9.4   Liver Function Tests No results for input(s): AST, ALT, ALKPHOS, BILITOT, PROT, ALBUMIN in the last 72 hours. No results  for input(s): LIPASE, AMYLASE in the last 72 hours. Cardiac Enzymes No results for input(s): CKTOTAL, CKMB, CKMBINDEX, TROPONINI in the last 72 hours. BNP Invalid input(s): POCBNP D-Dimer No results for input(s): DDIMER in the last 72 hours. Hemoglobin A1C No results for input(s): HGBA1C in the last 72 hours. Fasting Lipid Panel No results for input(s): CHOL, HDL, LDLCALC, TRIG, CHOLHDL, LDLDIRECT in the last 72 hours. Thyroid Function Tests No results for input(s): TSH, T4TOTAL, T3FREE, THYROIDAB in the last 72 hours.  Invalid input(s): FREET3 _____________  No results found. Disposition   Pt is being discharged home today in good condition.  Follow-up Plans & Appointments    Follow-up Information    [name], MD Follow up in 1 week(s).   Specialty:  Cardiology Contact information: [address] [place] [address] [phone]          Discharge Instructions    Amb  Referral to Cardiac Rehabilitation   Complete by:  As directed    Referring to [facility] Phase 2   Diagnosis:   Coronary Stents PTCA     Diet - low sodium heart healthy   Complete by:  As directed    Discharge instructions   Complete by:  As directed    No driving for 2 days. No lifting over 5 lbs for 1 week. No sexual activity for 1 week. You may return to work in 1 week. Keep procedure site clean & dry. If you notice increased pain, swelling, bleeding or pus, call/return!  You may shower, but no soaking baths/hot tubs/pools for 1 week.   Increase activity slowly   Complete by:  As directed       Discharge Medications   Allergies as of 11/15/2017      Reactions   Levaquin [levofloxacin] Other (See Comments)   Joint pain   Ace Inhibitors Cough   Crestor [rosuvastatin] Other (See Comments)   Sweet craving   Lunesta [eszopiclone] Other (See Comments)   Bitter taste   Zocor [simvastatin] Other (See Comments)   Memory problem      Medication List    STOP taking these medications   BC FAST PAIN RELIEF 650-195-33.3 MG Pack Generic drug:  Aspirin-Salicylamide-Caffeine     TAKE these medications   albuterol 108 (90 Base) MCG/ACT inhaler Commonly known as:  PROVENTIL HFA;VENTOLIN HFA Inhale 1-2 puffs into the lungs every 6 (six) hours as needed for wheezing or shortness of breath.   ALPRAZolam 0.5 MG tablet Commonly known as:  XANAX Take 1 tablet (0.5 mg total) by mouth daily as needed for anxiety.   amoxicillin-clavulanate 875-125 MG tablet Commonly known as:  AUGMENTIN Take 1 tablet by mouth 2 (two) times daily.   aspirin EC 81 MG tablet Take 81 mg by mouth daily.   atorvastatin 80 MG tablet Commonly known as:  LIPITOR Take 1 tablet (80 mg total) by mouth daily. What changed:    medication strength  how much to take   budesonide-formoterol 80-4.5 MCG/ACT inhaler Commonly known as:  SYMBICORT Inhale 2 puffs into the lungs 2 (two) times daily as needed  (wheezing).   carvedilol 6.25 MG tablet Commonly known as:  COREG TAKE 1 TABLET BY MOUTH TWICE A DAY   Co Q 10 100 MG Caps Take 100 mg by mouth daily.   ENTRESTO 24-26 MG Generic drug:  sacubitril-valsartan TAKE 1 TABLET BY MOUTH TWICE A DAY   fluticasone 50 MCG/ACT nasal spray Commonly known as:  FLONASE  PLACE 2 SPRAYS INTO BOTH NOSTRILS DAILY AS NEEDED (SEASONAL ALLERGIES).   KLOR-CON M20 20 MEQ tablet Generic drug:  potassium chloride SA TAKE 2 TABLETS BY MOUTH EVERY DAY What changed:  how much to take   metolazone 5 MG tablet Commonly known as:  ZAROXOLYN TAKE 1 TABLET (5 MG TOTAL) BY MOUTH DAILY AS NEEDED (FOR EDEMA).   mexiletine 200 MG capsule Commonly known as:  MEXITIL Take 1 capsule (200 mg total) by mouth 2 (two) times daily.   nitroGLYCERIN 0.4 MG SL tablet Commonly known as:  NITROSTAT Place 1 tablet (0.4 mg total) under the tongue every 5 (five) minutes as needed for chest pain.   NOREL AD PO Take 1 tablet by mouth 4 (four) times daily.   prasugrel 10 MG Tabs tablet Commonly known as:  EFFIENT Take 1 tablet (10 mg total) by mouth daily. Start taking on:  11/16/2017   pseudoephedrine 120 MG 12 hr tablet Commonly known as:  SUDAFED Take 120 mg by mouth every 12 (twelve) hours as needed for congestion.   spironolactone 25 MG tablet Commonly known as:  ALDACTONE Take 0.5 tablets (12.5 mg total) by mouth daily. What changed:  when to take this   tiZANidine 4 MG tablet Commonly known as:  ZANAFLEX TAKE 1 TABLET BY MOUTH EVERY 6 HOURS AS NEEDED SPASMS OF THE MUSCLE What changed:    how much to take  how to take this  when to take this  additional instructions   torsemide 20 MG tablet Commonly known as:  DEMADEX Take 2 tablets (40 mg total) by mouth 2 (two) times daily.   traMADol 50 MG tablet Commonly known as:  ULTRAM Take 1 tablet (50 mg total) by mouth every 8 (eight) hours as needed.        Acute coronary syndrome (MI, NSTEMI,  STEMI, etc) this admission?: No.    Outstanding Labs/Studies   Lipids in 6-8 weeks.  Duration of Discharge Encounter   Greater than 30 minutes including physician time.  Signed, Tami Lin Duke, PA 11/15/2017, 9:09 AM

## 2017-11-17 ENCOUNTER — Encounter (HOSPITAL_COMMUNITY): Payer: Self-pay | Admitting: Cardiovascular Disease

## 2017-11-17 MED FILL — Heparin Sod (Porcine)-NaCl IV Soln 1000 Unit/500ML-0.9%: INTRAVENOUS | Qty: 1000 | Status: AC

## 2017-11-18 ENCOUNTER — Encounter (HOSPITAL_COMMUNITY): Payer: Self-pay | Admitting: Cardiovascular Disease

## 2017-11-19 LAB — CUP PACEART REMOTE DEVICE CHECK
Battery Remaining Longevity: 32 mo
Battery Remaining Percentage: 34 %
Battery Voltage: 2.89 V
Brady Statistic RV Percent Paced: 1 %
HIGH POWER IMPEDANCE MEASURED VALUE: 46 Ohm
Implantable Lead Implant Date: 20111010
Implantable Lead Model: 7121
Lead Channel Impedance Value: 450 Ohm
Lead Channel Sensing Intrinsic Amplitude: 11.9 mV
Lead Channel Setting Pacing Amplitude: 4 V
Lead Channel Setting Pacing Pulse Width: 1 ms
MDC IDC LEAD LOCATION: 753860
MDC IDC MSMT LEADCHNL RV PACING THRESHOLD AMPLITUDE: 1.75 V
MDC IDC MSMT LEADCHNL RV PACING THRESHOLD PULSEWIDTH: 1 ms
MDC IDC PG IMPLANT DT: 20111010
MDC IDC PG SERIAL: 615318
MDC IDC SESS DTM: 20190919075043
MDC IDC SET LEADCHNL RV SENSING SENSITIVITY: 0.5 mV

## 2017-11-25 ENCOUNTER — Other Ambulatory Visit: Payer: Self-pay

## 2017-11-25 ENCOUNTER — Encounter (HOSPITAL_COMMUNITY): Payer: Self-pay

## 2017-11-25 ENCOUNTER — Observation Stay (HOSPITAL_COMMUNITY)
Admission: EM | Admit: 2017-11-25 | Discharge: 2017-11-26 | Disposition: A | Discharge status: Home / Self Care | Payer: Self-pay | Attending: Internal Medicine | Admitting: Internal Medicine

## 2017-11-25 ENCOUNTER — Emergency Department (HOSPITAL_COMMUNITY): Payer: Self-pay

## 2017-11-25 DIAGNOSIS — I10 Essential (primary) hypertension: Secondary | ICD-10-CM | POA: Diagnosis present

## 2017-11-25 DIAGNOSIS — F419 Anxiety disorder, unspecified: Secondary | ICD-10-CM | POA: Insufficient documentation

## 2017-11-25 DIAGNOSIS — I110 Hypertensive heart disease with heart failure: Secondary | ICD-10-CM | POA: Insufficient documentation

## 2017-11-25 DIAGNOSIS — Z7982 Long term (current) use of aspirin: Secondary | ICD-10-CM | POA: Insufficient documentation

## 2017-11-25 DIAGNOSIS — I5022 Chronic systolic (congestive) heart failure: Secondary | ICD-10-CM | POA: Diagnosis present

## 2017-11-25 DIAGNOSIS — Z79899 Other long term (current) drug therapy: Secondary | ICD-10-CM | POA: Insufficient documentation

## 2017-11-25 DIAGNOSIS — I252 Old myocardial infarction: Secondary | ICD-10-CM | POA: Insufficient documentation

## 2017-11-25 DIAGNOSIS — Z95810 Presence of automatic (implantable) cardiac defibrillator: Secondary | ICD-10-CM | POA: Insufficient documentation

## 2017-11-25 DIAGNOSIS — J45909 Unspecified asthma, uncomplicated: Secondary | ICD-10-CM | POA: Diagnosis present

## 2017-11-25 DIAGNOSIS — I2510 Atherosclerotic heart disease of native coronary artery without angina pectoris: Secondary | ICD-10-CM | POA: Diagnosis present

## 2017-11-25 DIAGNOSIS — Z87891 Personal history of nicotine dependence: Secondary | ICD-10-CM | POA: Insufficient documentation

## 2017-11-25 DIAGNOSIS — R0789 Other chest pain: Principal | ICD-10-CM | POA: Insufficient documentation

## 2017-11-25 DIAGNOSIS — E785 Hyperlipidemia, unspecified: Secondary | ICD-10-CM | POA: Diagnosis present

## 2017-11-25 DIAGNOSIS — J019 Acute sinusitis, unspecified: Secondary | ICD-10-CM | POA: Diagnosis present

## 2017-11-25 DIAGNOSIS — Z955 Presence of coronary angioplasty implant and graft: Secondary | ICD-10-CM | POA: Insufficient documentation

## 2017-11-25 DIAGNOSIS — Z7951 Long term (current) use of inhaled steroids: Secondary | ICD-10-CM | POA: Insufficient documentation

## 2017-11-25 DIAGNOSIS — R079 Chest pain, unspecified: Secondary | ICD-10-CM | POA: Diagnosis present

## 2017-11-25 DIAGNOSIS — E7800 Pure hypercholesterolemia, unspecified: Secondary | ICD-10-CM | POA: Insufficient documentation

## 2017-11-25 DIAGNOSIS — I255 Ischemic cardiomyopathy: Secondary | ICD-10-CM | POA: Diagnosis present

## 2017-11-25 DIAGNOSIS — F329 Major depressive disorder, single episode, unspecified: Secondary | ICD-10-CM | POA: Insufficient documentation

## 2017-11-25 HISTORY — DX: Personal history of urinary calculi: Z87.442

## 2017-11-25 HISTORY — DX: Personal history of other diseases of the musculoskeletal system and connective tissue: Z87.39

## 2017-11-25 HISTORY — DX: Heart failure, unspecified: I50.9

## 2017-11-25 HISTORY — DX: Essential (primary) hypertension: I10

## 2017-11-25 HISTORY — DX: Ischemic cardiomyopathy: I25.5

## 2017-11-25 LAB — CBC
HCT: 48.6 % (ref 39.0–52.0)
Hemoglobin: 15.2 g/dL (ref 13.0–17.0)
MCH: 26.6 pg (ref 26.0–34.0)
MCHC: 31.3 g/dL (ref 30.0–36.0)
MCV: 85 fL (ref 80.0–100.0)
Platelets: 279 10*3/uL (ref 150–400)
RBC: 5.72 MIL/uL (ref 4.22–5.81)
RDW: 13.5 % (ref 11.5–15.5)
WBC: 8.9 10*3/uL (ref 4.0–10.5)
nRBC: 0 % (ref 0.0–0.2)

## 2017-11-25 LAB — BASIC METABOLIC PANEL
ANION GAP: 13 (ref 5–15)
BUN: 18 mg/dL (ref 6–20)
CHLORIDE: 99 mmol/L (ref 98–111)
CO2: 26 mmol/L (ref 22–32)
Calcium: 9.7 mg/dL (ref 8.9–10.3)
Creatinine, Ser: 1.13 mg/dL (ref 0.61–1.24)
GFR calc Af Amer: >60 mL/min (ref >60)
GFR calc non Af Amer: >60 mL/min (ref >60)
GLUCOSE: 80 mg/dL (ref 70–99)
Potassium: 3.5 mmol/L (ref 3.5–5.1)
Sodium: 138 mmol/L (ref 135–145)

## 2017-11-25 LAB — I-STAT TROPONIN, ED: Troponin i, poc: 0.01 ng/mL (ref 0.00–0.08)

## 2017-11-25 LAB — TROPONIN I: Troponin I: <0.03 ng/mL (ref <0.03)

## 2017-11-25 MED ORDER — ENSURE ENLIVE PO LIQD
237.0000 mL | Freq: Two times a day (BID) | ORAL | Status: DC
  Administered 2017-11-26: 237 mL via ORAL
  Filled 2017-11-25 (×2): qty 237

## 2017-11-25 MED ORDER — ONDANSETRON HCL 4 MG/2ML IJ SOLN: 4.0000 mg | Freq: Four times a day (QID) | INTRAMUSCULAR | Status: DC | PRN

## 2017-11-25 MED ORDER — MOMETASONE FURO-FORMOTEROL FUM 100-5 MCG/ACT IN AERO: 2.0000 | INHALATION_SPRAY | Freq: Two times a day (BID) | RESPIRATORY_TRACT | Status: DC | PRN

## 2017-11-25 MED ORDER — ALBUTEROL SULFATE HFA 108 (90 BASE) MCG/ACT IN AERS
1.0000 | INHALATION_SPRAY | Freq: Four times a day (QID) | RESPIRATORY_TRACT | Status: DC | PRN
  Filled 2017-11-25: qty 6.7

## 2017-11-25 MED ORDER — ASPIRIN EC 81 MG PO TBEC
81.0000 mg | DELAYED_RELEASE_TABLET | Freq: Every day | ORAL | Status: DC
  Administered 2017-11-26: 81 mg via ORAL
  Filled 2017-11-25: qty 1

## 2017-11-25 MED ORDER — ALBUTEROL SULFATE (2.5 MG/3ML) 0.083% IN NEBU: 2.5000 mg | INHALATION_SOLUTION | Freq: Four times a day (QID) | RESPIRATORY_TRACT | Status: DC | PRN

## 2017-11-25 MED ORDER — ACETAMINOPHEN 325 MG PO TABS: 650.0000 mg | ORAL_TABLET | ORAL | Status: DC | PRN

## 2017-11-25 MED ORDER — POTASSIUM CHLORIDE CRYS ER 20 MEQ PO TBCR
40.0000 meq | EXTENDED_RELEASE_TABLET | Freq: Every day | ORAL | Status: DC
  Administered 2017-11-26: 40 meq via ORAL
  Filled 2017-11-25 (×2): qty 2

## 2017-11-25 MED ORDER — HEPARIN SODIUM (PORCINE) 5000 UNIT/ML IJ SOLN
5000.0000 [IU] | Freq: Three times a day (TID) | INTRAMUSCULAR | Status: DC
  Administered 2017-11-26: 5000 [IU] via SUBCUTANEOUS
  Filled 2017-11-25 (×2): qty 1

## 2017-11-25 MED ORDER — TORSEMIDE 20 MG PO TABS
40.0000 mg | ORAL_TABLET | Freq: Two times a day (BID) | ORAL | Status: DC
  Administered 2017-11-25 – 2017-11-26 (×2): 40 mg via ORAL
  Filled 2017-11-25 (×2): qty 2

## 2017-11-25 MED ORDER — ALPRAZOLAM 0.5 MG PO TABS: 0.5000 mg | ORAL_TABLET | Freq: Every day | ORAL | Status: DC | PRN

## 2017-11-25 MED ORDER — CARVEDILOL 6.25 MG PO TABS
6.2500 mg | ORAL_TABLET | Freq: Two times a day (BID) | ORAL | Status: DC
  Administered 2017-11-25 – 2017-11-26 (×2): 6.25 mg via ORAL
  Filled 2017-11-25 (×2): qty 1

## 2017-11-25 MED ORDER — SPIRONOLACTONE 12.5 MG HALF TABLET
12.5000 mg | ORAL_TABLET | Freq: Every evening | ORAL | Status: DC
  Administered 2017-11-25: 12.5 mg via ORAL
  Filled 2017-11-25 (×2): qty 1

## 2017-11-25 MED ORDER — MEXILETINE HCL 200 MG PO CAPS
200.0000 mg | ORAL_CAPSULE | Freq: Two times a day (BID) | ORAL | Status: DC
  Administered 2017-11-25 – 2017-11-26 (×2): 200 mg via ORAL
  Filled 2017-11-25 (×2): qty 1

## 2017-11-25 MED ORDER — PRASUGREL HCL 10 MG PO TABS
10.0000 mg | ORAL_TABLET | Freq: Every day | ORAL | Status: DC
  Administered 2017-11-26: 10 mg via ORAL
  Filled 2017-11-25: qty 1

## 2017-11-25 MED ORDER — TRAMADOL HCL 50 MG PO TABS: 50.0000 mg | ORAL_TABLET | Freq: Three times a day (TID) | ORAL | Status: DC | PRN

## 2017-11-25 MED ORDER — TIZANIDINE HCL 4 MG PO TABS
4.0000 mg | ORAL_TABLET | Freq: Every evening | ORAL | Status: DC | PRN
  Administered 2017-11-25: 4 mg via ORAL
  Filled 2017-11-25 (×2): qty 1

## 2017-11-25 MED ORDER — NITROGLYCERIN 0.4 MG SL SUBL: 0.4000 mg | SUBLINGUAL_TABLET | SUBLINGUAL | Status: DC | PRN

## 2017-11-25 MED ORDER — SACUBITRIL-VALSARTAN 24-26 MG PO TABS
1.0000 | ORAL_TABLET | Freq: Two times a day (BID) | ORAL | Status: DC
  Administered 2017-11-25 – 2017-11-26 (×2): 1 via ORAL
  Filled 2017-11-25 (×2): qty 1

## 2017-11-25 MED ORDER — ATORVASTATIN CALCIUM 80 MG PO TABS
80.0000 mg | ORAL_TABLET | Freq: Every day | ORAL | Status: DC
  Administered 2017-11-26: 80 mg via ORAL
  Filled 2017-11-25: qty 1

## 2017-11-25 NOTE — ED Notes (Signed)
Patient transported to X-ray 

## 2017-11-25 NOTE — ED Notes (Signed)
Cardiology at bedside.

## 2017-11-25 NOTE — ED Triage Notes (Signed)
Patient complains of CP intermittently with diaphoresis and weakness since stent placed on 9/27. This am felt dizzy as well and drove from [place] after co-workers informed him that he didn't appear well. Alert and oriented and exertional SOB noted

## 2017-11-25 NOTE — Consult Note (Addendum)
Cardiology Consult    Patient ID: Jason Hogan MRN: [id number], DOB/AGE: [birth year]   Admit date: 11/25/2017 Date of Consult: 11/25/2017  Primary Physician: Corwin Levins, MD Primary Cardiologist: Charlton Haws, MD  Primary Electrophysiologist: Sherryl Manges, MD Requesting Provider: Doug Sou, MD (Emergency Department)  Patient Profile    Jason Hogan is a 42 y.o. male with a history of CAD with large anterior MI and collateralized silent distal RCA from PCI in 2011 and recent PCI with balloon angioplasty to stented segment of LAD and DES x1 to mid circumflex on 11/14/2017, chronic systolic CHF with EF of 30-35% from Echo on 11/10/2017, ischemic cardiomyopathy s/p AICD, prior VT/ICD shock in 10/2016 (on Mexiletine), HTN, HLD, elevated sed rate followed by Rheumatology, and asthma who is being seen today for the evaluation of chest pain at the request of Dr. Ethelda Chick.  History of Present Illness    Patient called [facility] on 09/30/2017 with complaints of intermittent chest pain that he described as sharp pain worse that was worse with movement and exercise. He denies diaphoresis, dizziness, or dyspnea at that time. He underwent a stress test on 10/16/2017 which showed a fixed large, severe mid to apical anterior, mid to apical anteroseptal, apical inferior, and true apex perfusion defect. This was suggestive of an old infarction without significant ischemia and was read as a high risk study due to low EF (25%). Echo on 11/10/2017 showed EF of 30-35%. Right/left heart catheterization on 11/14/2017 showed patent LAD stent with focal area of severe narrowing within stent with successful PTCA with balloon angioplasty of the LAD stented segment and severe stenosis of mid circumflex with successful PTCA/DES x1. Also noted chronic occlusion of mid RCA (distal RCA fills from left to right collaterals). Patient returns to [facility] ED today with chest pain.  Since discharge, patient recovering  from sinus infection and notes fatigue/decreased energy level. He notes intermittent feeling of "pins and needles" in his chest. He also reports palpitations two nights after being discharged on 11/17/2017 which he attributed to the Norel he took for his continued nasal congestion. Patient reports waking up multiple times throughout night last night feeling like his heart was racing. He checked his heart rate on his Apple watch, and his heart rate was in the upper 40s bpm to low 50s bpm. He would sit up for a minute and the feeling would resolve when his heart rate got up to about 70 bpm. This happened multiple times throughout the night to the point where the patient got out of bed and sat in a chair for a while. Patient states it felt like he was being "paced" last night and when he woke up his chest felt sore. He denies any ICD shocks.   While he was driving to [place] for work this morning, he developed substernal chest pressure/burning that he ranks as a 6/10 on the pain scale followed by a strong sense of "release" in his chest. He notes associated mild shortness of breath, dizziness, nausea, and sweating with this and felt likely he briefly had tunnel vision and pressure in his head. When he got to work, he was walking with a client and felt "winded" and a little lightheaded. He states he "just did not feel right" so he left work and drove straight to the ED.  Upon arrival to ED, vital signs stable. EKG showed sinus rhythm with no acute ST changes from prior tracings. I-stat troponin negative. Chest x-ray showed chronic bronchitic-smoking  related changes but no acute processes. WBC 8.9, Hgb 15.2, Plts 279. Na 138, K 3.5, Glucose 80, Scr 1.13. Cardiology was consulted and we will admit patient overnight for observation.  Of note, patient feels like his sinus infection has not improved. He completed his course of Augmentin but still reports nasal congestion, sinus pressure, and upper jaw pain.   Past  Medical History   Past Medical History:  Diagnosis Date  . Allergic rhinitis, cause unspecified 10/03/2011  . Anxiety 10/03/2011  . Asthma   . ASTHMA 09/20/2006   Qualifier: Diagnosis of  By: Jonny Ruiz MD, Len Blalock   . 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 Bacterial pneumonia   . CAD (coronary artery disease)   . CARDIOMYOPATHY, ISCHEMIC 10/20/2009   Qualifier: Diagnosis of  By: Graciela Husbands, MD, Ruthann Cancer Ty Hilts   . Cholelithiasis   . CHOLELITHIASIS 06/22/2007   Qualifier: Diagnosis of  By: Maris Berger   . Chronic systolic heart failure (HCC) 09/18/2009   Qualifier: Diagnosis of  By: Kem Parkinson    . Depression   . DEPRESSION 06/22/2007   Qualifier: Diagnosis of  By: Maris Berger   . Elevated blood pressure   . Heart failure   . Hypercholesteremia   . HYPERCHOLESTEROLEMIA 09/18/2009   Qualifier: Diagnosis of  By: Kem Parkinson    . Hyperlipidemia   . Impaired glucose tolerance 10/05/2012  . Insomnia   . INSOMNIA-SLEEP DISORDER-UNSPEC 06/22/2007   Qualifier: Diagnosis of  By: Jonny Ruiz MD, Len Blalock   . 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 Migraine headache   . MIGRAINE HEADACHE 09/18/2009   Qualifier: Diagnosis of  By: Kem Parkinson    . MYOCARDIAL INFARCTION 09/18/2009   Qualifier: Diagnosis of  By: Kem Parkinson    . Myocardial infarction (HCC)   . Neck pain    right  . Renal calculus   . RENAL CALCULUS, HX OF 09/20/2006   Qualifier: Diagnosis of  By: Jonny Ruiz MD, Len Blalock   . Renal insufficiency   . RENAL INSUFFICIENCY 09/18/2009   Qualifier: Diagnosis of  By: Kem Parkinson    . Respiratory failure (HCC)   . Respiratory failure (HCC)   . Rhabdomyolysis   . Rhabdomyolysis 09/18/2009   Qualifier: Diagnosis of  By: Kem Parkinson    . URI (upper respiratory infection)     Past Surgical History:  Procedure Laterality Date  . CARDIAC CATHETERIZATION N/A 05/17/2015   Procedure:  Left Heart Cath and Coronary Angiography;  Surgeon: Wendall Stade, MD;  Location: [facility] INVASIVE CV LAB;  Service: Cardiovascular;  Laterality: N/A;  . CORONARY STENT INTERVENTION N/A 11/14/2017   Procedure: CORONARY STENT INTERVENTION;  Surgeon: Kathleene Hazel, MD;  Location: [facility] INVASIVE CV LAB;  Service: Cardiovascular;  Laterality: N/A;  . ICD IMPLANT  11/2009  . RIGHT/LEFT HEART CATH AND CORONARY ANGIOGRAPHY N/A 11/14/2017   Procedure: RIGHT/LEFT HEART CATH AND CORONARY ANGIOGRAPHY;  Surgeon: Kathleene Hazel, MD;  Location: [facility] INVASIVE CV LAB;  Service: Cardiovascular;  Laterality: N/A;     Allergies  Allergies  Allergen Reactions  . Levaquin [Levofloxacin] Other (See Comments)    Joint pain  . Ace Inhibitors Cough  . Crestor [Rosuvastatin] Other (See Comments)    "Sweet cravings"  . Lunesta [Eszopiclone] Other (See Comments)    "Bitter taste in mouth"  . Zocor [Simvastatin] Other (See Comments)    Memory problem    Inpatient  Medications    . [START ON 11/26/2017] aspirin EC  81 mg Oral Daily  . [START ON 11/26/2017] atorvastatin  80 mg Oral Daily  . carvedilol  6.25 mg Oral BID  . heparin  5,000 Units Subcutaneous Q8H  . mexiletine  200 mg Oral BID  . [START ON 11/26/2017] potassium chloride SA  40 mEq Oral Daily  . [START ON 11/26/2017] prasugrel  10 mg Oral Daily  . sacubitril-valsartan  1 tablet Oral BID  . spironolactone  12.5 mg Oral QPM  . torsemide  40 mg Oral BID    Family History    Family History  Problem Relation Age of Onset  . Heart attack Father   . Heart disease Unknown   . Diabetes Unknown   . Hypothyroidism Unknown    He indicated that his mother is alive. He indicated that his father is alive. He indicated that his maternal grandmother is deceased. He indicated that his maternal grandfather is deceased. He indicated that his paternal grandmother is deceased. He indicated that his paternal grandfather is deceased. He indicated that the  status of his unknown relative is unknown.   Social History    Social History   Socioeconomic History  . Marital status: Married    Spouse name: Not on file  . Number of children: 0  . Years of education: Not on file  . Highest education level: Not on file  Occupational History  . 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 Financial resource strain: Not on file  . Food insecurity:    Worry: Not on file    Inability: Not on file  . Transportation needs:    Medical: Not on file    Non-medical: Not on file  Tobacco Use  . Smoking status: Former Smoker    Packs/day: 0.10    Years: 2.00    Pack years: 0.20    Last attempt to quit: 02/18/1994    Years since quitting: 23.7  . Smokeless tobacco: Never Used  Substance and Sexual Activity  . Alcohol use: Yes  . Drug use: No  . Sexual activity: Not on file  Lifestyle  . Physical activity:    Days per week: Not on file    Minutes per session: Not on file  . Stress: Not on file  Relationships  . Social connections:    Talks on phone: Not on file    Gets together: Not on file    Attends religious service: Not on file    Active member of club or organization: Not on file    Attends meetings of clubs or organizations: Not on file    Relationship status: Not on file  . Intimate partner violence:    Fear of current or ex partner: Not on file    Emotionally abused: Not on file    Physically abused: Not on file    Forced sexual activity: Not on file  Other Topics Concern  . Not on file  Social History Narrative  . Not on file     Review of Systems    Review of Systems  Constitutional: Positive for diaphoresis, malaise/fatigue and weight loss. Negative for chills and fever.  HENT: Positive for congestion and sinus pain.   Eyes: Negative for blurred vision, double vision and photophobia.  Respiratory: Positive for shortness of breath. Negative for cough, hemoptysis and sputum production.     Cardiovascular: Positive for chest pain and palpitations. Negative for orthopnea,  leg swelling and PND.  Gastrointestinal: Positive for nausea. Negative for abdominal pain, blood in stool and vomiting.  Genitourinary: Negative for hematuria.  Musculoskeletal: Negative for myalgias.  Neurological: Positive for dizziness, weakness and headaches. Negative for loss of consciousness.  Endo/Heme/Allergies: Bruises/bleeds easily (easy bruising, no abnormal bleeding).    Physical Exam    Blood pressure 135/90, pulse 73, temperature 98 F (36.7 C), temperature source Oral, resp. rate 16, SpO2 98 %.  General: 42 y.o. male resting comfortably in no acute distress. Pleasant and cooperative. HEENT: Normal  Neck: Supple. No bruits or JVD appreciated. Lungs: No increased work of breathing. Clear to auscultation bilaterally. No wheezes, rhonchi, or rales. Heart: RRR. Distinct S1 and S2. No murmurs, gallops, or rubs.  Abdomen: Soft, non-distended, and non-tender to palpation. Bowel sounds present in all 4 quadrants.   Extremities: No lower extremity edema. Distal pedal pulses and radial pulses 2+ and equal bilaterally. Neuro: Alert and oriented x3. No focal deficits. Moves all extremities spontaneously. Psych: Normal affect.  Labs    Troponin (Point of Care Test) Recent Labs    11/25/17 1301  TROPIPOC 0.01   No results for input(s): CKTOTAL, CKMB, TROPONINI in the last 72 hours. Lab Results  Component Value Date   WBC 8.9 11/25/2017   HGB 15.2 11/25/2017   HCT 48.6 11/25/2017   MCV 85.0 11/25/2017   PLT 279 11/25/2017    Recent Labs  Lab 11/25/17 1258  NA 138  K 3.5  CL 99  CO2 26  BUN 18  CREATININE 1.13  CALCIUM 9.7  GLUCOSE 80   Lab Results  Component Value Date   CHOL 149 07/01/2017   HDL 28.40 (L) 07/01/2017   LDLCALC 101 (H) 07/01/2017   TRIG 101.0 07/01/2017   Lab Results  Component Value Date   DDIMER 0.34 04/29/2017     Radiology Studies    Dg Chest 2  View  Result Date: 11/25/2017 CLINICAL DATA:  Onset of chest pain today. Recent coronary stent placement in November 14, 2017. EXAM: CHEST - 2 VIEW COMPARISON:  PA and lateral chest x-ray of April 29, 2017 FINDINGS: The lungs are adequately inflated. There is no focal infiltrate. The interstitial markings are coarse but stable. The heart and pulmonary vascularity are normal. The mediastinum is normal in width. The ICD is in stable position. There is no pleural effusion. There is gentle dextrocurvature centered in the midthoracic spine which is stable. IMPRESSION: Chronic bronchitic-smoking related changes, stable. No pneumonia, pulmonary edema, nor other acute cardiopulmonary abnormality. Electronically Signed   By: David  [place] M.D.   On: 11/25/2017 13:31    EKG     EKG: EKG was personally reviewed and demonstrates: normal sinus rhythm, rate 72 bpm, with no acute ST changes from last tracing.   Telemetry: Telemetry was personally reviewed and demonstrates: normal sinus rhythm, rate ranging between   Cardiac Imaging    Right/Left Heart Catheterization 11/14/2017: Conclusion:  Prox RCA to Mid RCA lesion is 100% stenosed.  Prox Cx lesion is 99% stenosed.  A drug-eluting stent was successfully placed using a STENT SYNERGY DES 3.5X20.  Post intervention, there is a 0% residual stenosis.  Ost LAD to Prox LAD lesion is 70% stenosed.  Balloon angioplasty was performed using a BALLOON SAPPHIRE [place] 3.0X8.  Post intervention, there is a 20% residual stenosis.   1. Patent LAD stent with focal area of severe narrowing within the stent. Successful PTCA with balloon angioplasty only of the LAD stented segment.  2. Severe stenosis mid Circumflex artery. Successful PTCA/DES x 1 mid Circumflex 3. Chronic occlusion mid RCA. The distal RCA fills from left to right collaterals.   Recommendations:  Recommend uninterrupted dual antiplatelet therapy with Aspirin 81mg  daily and Effient 10 mg daily for a  minimum of 12 months (ACS - Class I recommendation). _______________  Echocardiogram 11/10/2017: Study Conclusions: - Left ventricle: Septal apical and inferior hypokinesis. The   cavity size was moderately dilated. Wall thickness was normal.   Systolic function was moderately to severely reduced. The   estimated ejection fraction was in the range of 30% to 35%. Left   ventricular diastolic function parameters were normal. - Left atrium: The atrium was mildly dilated. - Atrial septum: No defect or patent foramen ovale was identified. ________________   Eugenie Birks Myoview 10/16/2017: Results:  Nuclear stress EF: 25%.  The left ventricular ejection fraction is severely decreased (<30%).  There was no ST segment deviation noted during stress.  Findings consistent with prior myocardial infarction.  This is a high risk study.   1. EF 25%, anterior, anteroseptal, and apical hypokinesis.  2. Fixed large, severe mid to apical anterior, mid to apical anteroseptal, apical inferior, and true apex perfusion defect. This suggests infarction without significant ischemia.   High risk study due to low EF.    Assessment & Plan    1. Chest Pain - Patient presents with chest pressure/burning at rest with associated dyspnea, dizziness, and nausea. Patient recently had right/left catheterization with stenting on 11/14/2017.  - EKG showed normal sinus rhythm with no acute ST changes since prior tracing. - I-stat troponin negative.  - Given patient's recent catheterization with PCI and presentation, will admit for observation.  - Continue to trend troponin x3.  - Will get an EKG in the morning.  - Continue Aspirin 81mg  daily.   - Continue Effient 10mg  daily.   - Continue Coreg 6.25mg  twice daily.  - Continue Entresto 24-26mg  twice daily.  - Continue Nitro as needed for pain.   - Continue telemetry. - Patient reports palpitations that woke him up from sleep multiple times last night. Patient  states it felt like he was "getting paced" with his ICD last night. No shocks were given. Will interrogate device.    2. CAD s/p Recent PCI on 11/14/2017 - Lexiscan myoview 10/16/2017 showed fixed large perfusion defect suggestive of infarction but no significant ischemia. This was read has a high risk study due to low EF of 25%. - Echo 11/10/2017 showed LVEF of 30-35%, reduced from 40-45% from Echo in 08/2016. - Right/left catheterization on 11/14/2017 showed  patent LAD stent with focal area of severe narrowing within stent with successful PTCA with balloon angioplasty of the LAD stented segment and severe stenosis of mid circumflex with successful PTCA/DES x1. Also noted chronic occlusion of mid RCA (distal RCA fills from left to right collaterals). - Continue home medications as above.   3. Chronic Systolic CHF / Ischemic Cardiomyopathy s/p AICD - Echo 11/10/2017 showed LVEF of 30-35%. - Patient is compliant with medications, monitors his weights daily, and watches his salt intake at home. - Patient does not appear volume overloaded on exam today. Lungs or clear and no peripheral edema. - Continue Mexitil 200mg  twice daily. - Continue Torsemide 40mg  twice daily. - Continue Coreg as above. - Continue Entresto as above.  - Continue Aldactone 12.5mg  daily - Continue to monitor daily weights.   4. HTN  - BP well controlled at this time. Most recent BP  123/84.  - Continue home medications as above.   5. HLD - LDL 101 in 06/2017. Given CAD, goal is <70. - Continue Lipitor as above.   6. Sinusitis - Patient diagnosed with sinus infection at Urgent Care prior to catheterization on 11/14/2017. He completed course of Augmentin without any improvement. Patient still reports nasal congestion, sinus pressure, and upper jaw pain. - Discussed with pharmacy who recommended Clindamycin 300mg  three times daily for 7-10 days.  7. Asthma - Continue Albuterol and Dulera inhalers as needed.    Signed, Corrin Parker, PA-C 11/25/2017, 6:21 PM   Pt seen and examined   I agree with findings as noted by Thane Edu above  Pt is a 42 yo with known CAD  Recent coronary intervention in September The patient reports last night waking up, feeling like heart racing   Pulse 40s to 70s   Uncomfortable  Sat up for awhile Today while driving developed a pressure sensation in chest and then a sudden release   Felt weak, shaky after    Lightheaded    Came to [facility] for furhter evaluation  On exam, pt is comfortable Lungs are CTA Cardiac exam:  RRR   Normal S1, S2   No S3, S4 or murmurs Abd is benign with no hepaomegaly Ext   Feet warm   No edema  2+ pulses  EKG shows SR with no acute changes   Impression:    PT is s/p coronary intervention a few wks ago   Yesterday and today felt bad  Sensation of heart racing,   Today Chest pressure then "pop"    Weak after    EKG is without acute changes which goes against acute stent complication Pt does have signif residual disease (70% LAD and 100% RCA)   May have contributed to symptoms   LVEF is down but volume status is OK   ? If BP was low at some point today which could have exacerbated his symptoms  Recomm that the pt be admitted for monitoring   COntinue telemetry R/O MI    Willl get [facility] to interrogate device Follow BP and HR    Titrate meds as needed    Dietrich Pates

## 2017-11-25 NOTE — ED Provider Notes (Signed)
[facility] EMERGENCY DEPARTMENT Provider Note   CSN: [id number] Arrival date & time: 11/25/17  1233     History   Chief Complaint No chief complaint on file. Complaint chest pain HPI Jason Hogan is a 42 y.o. male.  Complains of anterior chest pain described as pressure nonradiating, feels like heart pain onset last night while in bed.  He had 4 episodes each lasting 5 to 10 minutes.  He had another episode while driving this morning lasting approximately 5 minutes.  He is presently asymptomatic without treatment.  Chest pain accompanied by shortness of breath sweatiness and nausea.  Presently asymptomatic  HPI  Past Medical History:  Diagnosis Date  . Allergic rhinitis, cause unspecified 10/03/2011  . Anxiety 10/03/2011  . Asthma   . ASTHMA 09/20/2006   Qualifier: Diagnosis of  By: Jonny Ruiz MD, Len Blalock   . 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 Bacterial pneumonia   . CAD (coronary artery disease)   . CARDIOMYOPATHY, ISCHEMIC 10/20/2009   Qualifier: Diagnosis of  By: Graciela Husbands, MD, Ruthann Cancer Ty Hilts   . Cholelithiasis   . CHOLELITHIASIS 06/22/2007   Qualifier: Diagnosis of  By: Maris Berger   . Chronic systolic heart failure (HCC) 09/18/2009   Qualifier: Diagnosis of  By: Kem Parkinson    . Depression   . DEPRESSION 06/22/2007   Qualifier: Diagnosis of  By: Maris Berger   . Elevated blood pressure   . Heart failure   . Hypercholesteremia   . HYPERCHOLESTEROLEMIA 09/18/2009   Qualifier: Diagnosis of  By: Kem Parkinson    . Hyperlipidemia   . Impaired glucose tolerance 10/05/2012  . Insomnia   . INSOMNIA-SLEEP DISORDER-UNSPEC 06/22/2007   Qualifier: Diagnosis of  By: Jonny Ruiz MD, Len Blalock   . 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 Migraine headache   . MIGRAINE HEADACHE 09/18/2009   Qualifier: Diagnosis of  By: Kem Parkinson    . MYOCARDIAL INFARCTION 09/18/2009     Qualifier: Diagnosis of  By: Kem Parkinson    . Myocardial infarction (HCC)   . Neck pain    right  . Renal calculus   . RENAL CALCULUS, HX OF 09/20/2006   Qualifier: Diagnosis of  By: Jonny Ruiz MD, Len Blalock   . Renal insufficiency   . RENAL INSUFFICIENCY 09/18/2009   Qualifier: Diagnosis of  By: Kem Parkinson    . Respiratory failure (HCC)   . Respiratory failure (HCC)   . Rhabdomyolysis   . Rhabdomyolysis 09/18/2009   Qualifier: Diagnosis of  By: Kem Parkinson    . URI (upper respiratory infection)     Patient Active Problem List   Diagnosis Date Noted  . CAD (coronary artery disease) 11/15/2017  . Unstable angina (HCC)   . Labral tear of shoulder, right, initial encounter 03/04/2017  . Acute bursitis of right shoulder 02/04/2017  . Elevated CPK 01/01/2017  . Elevated sed rate 01/01/2017  . Hematuria 07/17/2016  . Feels feverish 07/04/2016  . Chest pain 07/04/2016  . Dyspnea 07/04/2016  . Rib pain on right side 07/04/2016  . Abnormal urine 07/04/2016  . Low back pain 07/04/2016  . Muscle cramps 07/04/2016  . Strain of groin, left, initial encounter 06/19/2016  . Capsulitis of toe of right foot 03/08/2015  . Cervical radiculopathy at C8 03/08/2015  . Right shoulder pain 12/15/2014  . Polyarthralgia 11/17/2014  . Pes anserine bursitis 11/17/2014  . Epistaxis,  recurrent 08/18/2014  . Allergic rhinitis 08/18/2014  . Shoulder bursitis 08/01/2014  . Tachycardia 04/19/2014  . Acute respiratory infection 04/19/2014  . Gout 12/06/2013  . Insomnia 10/14/2013  . 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 Cough 06/14/2013  . Impaired glucose tolerance 10/05/2012  . Anxiety 10/03/2011  . Encounter for well adult exam with abnormal findings 10/02/2011  . Implantable cardioverter-defibrillator (ICD) in situ 01/17/2010  . CARDIOMYOPATHY, ISCHEMIC 10/20/2009  . HYPERCHOLESTEROLEMIA 09/18/2009  . MIGRAINE HEADACHE 09/18/2009  . MYOCARDIAL INFARCTION 09/18/2009  . Chronic systolic  heart failure (HCC) 78/29/5621  . 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/18/2009  . Rhabdomyolysis 09/18/2009  . ELEVATED BLOOD PRESSURE WITHOUT DIAGNOSIS OF HYPERTENSION 12/19/2008  . DEPRESSION 06/22/2007  . Sleep disorder 06/22/2007  . Asthma 09/20/2006  . RENAL CALCULUS, HX OF 09/20/2006    Past Surgical History:  Procedure Laterality Date  . CARDIAC CATHETERIZATION N/A 05/17/2015   Procedure: Left Heart Cath and Coronary Angiography;  Surgeon: Wendall Stade, MD;  Location: [facility] INVASIVE CV LAB;  Service: Cardiovascular;  Laterality: N/A;  . CORONARY STENT INTERVENTION N/A 11/14/2017   Procedure: CORONARY STENT INTERVENTION;  Surgeon: Kathleene Hazel, MD;  Location: [facility] INVASIVE CV LAB;  Service: Cardiovascular;  Laterality: N/A;  . ICD IMPLANT  11/2009  . RIGHT/LEFT HEART CATH AND CORONARY ANGIOGRAPHY N/A 11/14/2017   Procedure: RIGHT/LEFT HEART CATH AND CORONARY ANGIOGRAPHY;  Surgeon: Kathleene Hazel, MD;  Location: [facility] INVASIVE CV LAB;  Service: Cardiovascular;  Laterality: N/A;        Home Medications    Prior to Admission medications   Medication Sig Start Date End Date Taking? Authorizing Provider  albuterol (PROAIR HFA) 108 (90 Base) MCG/ACT inhaler Inhale 1-2 puffs into the lungs every 6 (six) hours as needed for wheezing or shortness of breath. 07/01/17   Corwin Levins, MD  ALPRAZolam Prudy Feeler) 0.5 MG tablet Take 1 tablet (0.5 mg total) by mouth daily as needed for anxiety. 07/01/17   Corwin Levins, MD  amoxicillin-clavulanate (AUGMENTIN) 875-125 MG tablet Take 1 tablet by mouth 2 (two) times daily.    [provider]  aspirin EC 81 MG tablet Take 81 mg by mouth daily.    [provider]  atorvastatin (LIPITOR) 80 MG tablet Take 1 tablet (80 mg total) by mouth daily. 11/15/17 11/15/18  Marcelino Duster, PA  budesonide-formoterol (SYMBICORT) 80-4.5 MCG/ACT inhaler Inhale 2 puffs into the lungs 2 (two) times daily as needed (wheezing).  07/01/17   Corwin Levins, MD  carvedilol (COREG) 6.25 MG tablet TAKE 1 TABLET BY MOUTH TWICE A DAY 07/07/17   Wendall Stade, MD  Chlorphen-PE-Acetaminophen (NOREL AD PO) Take 1 tablet by mouth 4 (four) times daily.    [provider]  Coenzyme Q10 (CO Q 10) 100 MG CAPS Take 100 mg by mouth daily.     [provider]  ENTRESTO 24-26 MG TAKE 1 TABLET BY MOUTH TWICE A DAY Patient taking differently: Take 1 tablet by mouth 2 (two) times daily.  02/26/17   Duke Salvia, MD  fluticasone (FLONASE) 50 MCG/ACT nasal spray PLACE 2 SPRAYS INTO BOTH NOSTRILS DAILY AS NEEDED (SEASONAL ALLERGIES). 06/18/17   Nche, Bonna Gains, NP  KLOR-CON M20 20 MEQ tablet TAKE 2 TABLETS BY MOUTH EVERY DAY Patient taking differently: Take 40 mEq by mouth daily.  10/31/17   Duke Salvia, MD  metolazone (ZAROXOLYN) 5 MG tablet TAKE 1 TABLET (5 MG TOTAL) BY MOUTH DAILY AS NEEDED (FOR EDEMA).  10/08/16   Wendall Stade, MD  mexiletine (MEXITIL) 200 MG capsule Take 1 capsule (200 mg total) by mouth 2 (two) times daily. 06/05/17   Duke Salvia, MD  nitroGLYCERIN (NITROSTAT) 0.4 MG SL tablet Place 1 tablet (0.4 mg total) under the tongue every 5 (five) minutes as needed for chest pain. 10/02/17 12/31/17  Jodelle Gross, NP  prasugrel (EFFIENT) 10 MG TABS tablet Take 1 tablet (10 mg total) by mouth daily. 11/16/17   Duke, Roe Rutherford, PA  pseudoephedrine (SUDAFED) 120 MG 12 hr tablet Take 120 mg by mouth every 12 (twelve) hours as needed for congestion.    [provider]  spironolactone (ALDACTONE) 25 MG tablet Take 0.5 tablets (12.5 mg total) by mouth daily. Patient taking differently: Take 12.5 mg by mouth every evening.  10/10/17   Duke Salvia, MD  tiZANidine (ZANAFLEX) 4 MG tablet TAKE 1 TABLET BY MOUTH EVERY 6 HOURS AS NEEDED SPASMS OF THE MUSCLE Patient taking differently: Take 4 mg by mouth every evening.  07/01/17   Corwin Levins, MD  torsemide (DEMADEX) 20 MG tablet Take 2 tablets (40 mg  total) by mouth 2 (two) times daily. 09/16/17   Wendall Stade, MD  traMADol (ULTRAM) 50 MG tablet Take 1 tablet (50 mg total) by mouth every 8 (eight) hours as needed. Patient not taking: Reported on 11/10/2017 10/02/17   Jodelle Gross, NP    Family History Family History  Problem Relation Age of Onset  . Heart attack Father   . Heart disease Unknown   . Diabetes Unknown   . Hypothyroidism Unknown     Social History Social History   Tobacco Use  . Smoking status: Former Smoker    Packs/day: 0.10    Years: 2.00    Pack years: 0.20    Last attempt to quit: 02/18/1994    Years since quitting: 23.7  . Smokeless tobacco: Never Used  Substance Use Topics  . Alcohol use: Yes  . Drug use: No     Allergies   Levaquin [levofloxacin]; Ace inhibitors; Crestor [rosuvastatin]; Lunesta [eszopiclone]; and Zocor [simvastatin]   Review of Systems Review of Systems  Constitutional: Positive for diaphoresis.  HENT: Negative.   Respiratory: Positive for shortness of breath.   Cardiovascular: Positive for chest pain.       Syncope  Gastrointestinal: Positive for nausea.  Musculoskeletal: Negative.   Skin: Negative.   Allergic/Immunologic: Negative.   Neurological: Negative.   Psychiatric/Behavioral: Negative.   All other systems reviewed and are negative.    Physical Exam Updated Vital Signs BP (!) 134/91 (BP Location: Right Arm)   Pulse 76   Temp 98.7 F (37.1 C) (Oral)   Resp 18   SpO2 100%   Physical Exam  Constitutional: He appears well-developed and well-nourished.  HENT:  Head: Normocephalic and atraumatic.  Eyes: Pupils are equal, round, and reactive to light. Conjunctivae are normal.  Neck: Neck supple. No tracheal deviation present. No thyromegaly present.  Cardiovascular: Normal rate and regular rhythm.  No murmur heard. Pulmonary/Chest: Effort normal and breath sounds normal.  Abdominal: Soft. Bowel sounds are normal. He exhibits no distension. There is  no tenderness.  Musculoskeletal: Normal range of motion. He exhibits no edema or tenderness.  Neurological: He is alert. Coordination normal.  Skin: Skin is warm and dry. No rash noted.  Psychiatric: He has a normal mood and affect.  Nursing note and vitals reviewed.    ED Treatments / Results  Labs (  all labs ordered are listed, but only abnormal results are displayed) Labs Reviewed  CBC  BASIC METABOLIC PANEL  I-STAT TROPONIN, ED    EKG EKG Interpretation  Date/Time:  Tuesday November 25 2017 12:36:35 EDT Ventricular Rate:  72 PR Interval:  182 QRS Duration: 104 QT Interval:  404 QTC Calculation: 442 R Axis:   80 Text Interpretation:  Normal sinus rhythm Septal infarct , age undetermined Abnormal ECG Poor data quality Confirmed by Doug Sou [phone]) on 11/25/2017 1:06:17 PM   Radiology Dg Chest 2 View  Result Date: 11/25/2017 CLINICAL DATA:  Onset of chest pain today. Recent coronary stent placement in November 14, 2017. EXAM: CHEST - 2 VIEW COMPARISON:  PA and lateral chest x-ray of April 29, 2017 FINDINGS: The lungs are adequately inflated. There is no focal infiltrate. The interstitial markings are coarse but stable. The heart and pulmonary vascularity are normal. The mediastinum is normal in width. The ICD is in stable position. There is no pleural effusion. There is gentle dextrocurvature centered in the midthoracic spine which is stable. IMPRESSION: Chronic bronchitic-smoking related changes, stable. No pneumonia, pulmonary edema, nor other acute cardiopulmonary abnormality. Electronically Signed   By: David  [place] M.D.   On: 11/25/2017 13:31    Procedures Procedures (including critical care time)  Medications Ordered in ED Medications - No data to display  Chest x-ray viewed by me Results for orders placed or performed during the hospital encounter of 11/25/17  Basic metabolic panel  Result Value Ref Range   Sodium 138 135 - 145 mmol/L   Potassium 3.5 3.5  - 5.1 mmol/L   Chloride 99 98 - 111 mmol/L   CO2 26 22 - 32 mmol/L   Glucose, Bld 80 70 - 99 mg/dL   BUN 18 6 - 20 mg/dL   Creatinine, Ser 6.04 0.61 - 1.24 mg/dL   Calcium 9.7 8.9 - 54.0 mg/dL   GFR calc non Af Amer >60 >60 mL/min   GFR calc Af Amer >60 >60 mL/min   Anion gap 13 5 - 15  CBC  Result Value Ref Range   WBC 8.9 4.0 - 10.5 K/uL   RBC 5.72 4.22 - 5.81 MIL/uL   Hemoglobin 15.2 13.0 - 17.0 g/dL   HCT 98.1 19.1 - 47.8 %   MCV 85.0 80.0 - 100.0 fL   MCH 26.6 26.0 - 34.0 pg   MCHC 31.3 30.0 - 36.0 g/dL   RDW 29.5 62.1 - 30.8 %   Platelets 279 150 - 400 K/uL   nRBC 0.0 0.0 - 0.2 %  I-stat troponin, ED  Result Value Ref Range   Troponin i, poc 0.01 0.00 - 0.08 ng/mL   Comment 3           Dg Chest 2 View  Result Date: 11/25/2017 CLINICAL DATA:  Onset of chest pain today. Recent coronary stent placement in November 14, 2017. EXAM: CHEST - 2 VIEW COMPARISON:  PA and lateral chest x-ray of April 29, 2017 FINDINGS: The lungs are adequately inflated. There is no focal infiltrate. The interstitial markings are coarse but stable. The heart and pulmonary vascularity are normal. The mediastinum is normal in width. The ICD is in stable position. There is no pleural effusion. There is gentle dextrocurvature centered in the midthoracic spine which is stable. IMPRESSION: Chronic bronchitic-smoking related changes, stable. No pneumonia, pulmonary edema, nor other acute cardiopulmonary abnormality. Electronically Signed   By: David  [place] M.D.   On: 11/25/2017 13:31   Initial Impression /  Assessment and Plan / ED Course  I have reviewed the triage vital signs and the nursing notes.  Pertinent labs & imaging results that were available during my care of the patient were reviewed by me and considered in my medical decision making (see chart for details).   [facility] consulted and will evaluate patient in ED and arrange for overnight stay. 2:30 PM patient remains  asymptomatic.  Resting comfortably Final Clinical Impressions(s) / ED Diagnoses  Diagnosis chest pain at rest Final diagnoses:  None    ED Discharge Orders    None       Doug Sou, MD 11/25/17 1435

## 2017-11-26 ENCOUNTER — Encounter (HOSPITAL_COMMUNITY): Payer: Self-pay | Admitting: Student

## 2017-11-26 DIAGNOSIS — R079 Chest pain, unspecified: Secondary | ICD-10-CM

## 2017-11-26 DIAGNOSIS — I2510 Atherosclerotic heart disease of native coronary artery without angina pectoris: Secondary | ICD-10-CM

## 2017-11-26 DIAGNOSIS — I5022 Chronic systolic (congestive) heart failure: Secondary | ICD-10-CM

## 2017-11-26 LAB — TROPONIN I: Troponin I: <0.03 ng/mL (ref <0.03)

## 2017-11-26 LAB — BASIC METABOLIC PANEL
ANION GAP: 8 (ref 5–15)
BUN: 17 mg/dL (ref 6–20)
CO2: 29 mmol/L (ref 22–32)
Calcium: 9 mg/dL (ref 8.9–10.3)
Chloride: 98 mmol/L (ref 98–111)
Creatinine, Ser: 1.27 mg/dL — ABNORMAL HIGH (ref 0.61–1.24)
GLUCOSE: 104 mg/dL — AB (ref 70–99)
POTASSIUM: 3.3 mmol/L — AB (ref 3.5–5.1)
Sodium: 135 mmol/L (ref 135–145)

## 2017-11-26 LAB — CBC
HCT: 45.3 % (ref 39.0–52.0)
HEMOGLOBIN: 14.1 g/dL (ref 13.0–17.0)
MCH: 25.9 pg — AB (ref 26.0–34.0)
MCHC: 31.1 g/dL (ref 30.0–36.0)
MCV: 83.1 fL (ref 80.0–100.0)
NRBC: 0 % (ref 0.0–0.2)
Platelets: 250 10*3/uL (ref 150–400)
RBC: 5.45 MIL/uL (ref 4.22–5.81)
RDW: 13.6 % (ref 11.5–15.5)
WBC: 10 10*3/uL (ref 4.0–10.5)

## 2017-11-26 LAB — HIV ANTIBODY (ROUTINE TESTING W REFLEX): HIV SCREEN 4TH GENERATION: NONREACTIVE

## 2017-11-26 MED ORDER — CLINDAMYCIN HCL 300 MG PO CAPS: 300.0000 mg | ORAL_CAPSULE | Freq: Three times a day (TID) | ORAL | Status: DC

## 2017-11-26 MED ORDER — CLINDAMYCIN HCL 300 MG PO CAPS: 300.0000 mg | ORAL_CAPSULE | Freq: Three times a day (TID) | ORAL | 0 refills | Status: DC

## 2017-11-26 NOTE — Progress Notes (Signed)
Progress Note  Patient Name: Jason Hogan Date of Encounter: 11/26/2017  Primary Cardiologist: Charlton Haws, MD   Subjective   Feeling good   Has been up moving around   No dizzines   No CP    Inpatient Medications    Scheduled Meds: . aspirin EC  81 mg Oral Daily  . atorvastatin  80 mg Oral Daily  . carvedilol  6.25 mg Oral BID  . feeding supplement (ENSURE ENLIVE)  237 mL Oral BID BM  . heparin  5,000 Units Subcutaneous Q8H  . mexiletine  200 mg Oral BID  . potassium chloride SA  40 mEq Oral Daily  . prasugrel  10 mg Oral Daily  . sacubitril-valsartan  1 tablet Oral BID  . spironolactone  12.5 mg Oral QPM  . torsemide  40 mg Oral BID   Continuous Infusions:  PRN Meds: acetaminophen, albuterol, ALPRAZolam, mometasone-formoterol, nitroGLYCERIN, ondansetron (ZOFRAN) IV, tiZANidine, traMADol   Vital Signs    Vitals:   11/25/17 1819 11/25/17 2339 11/26/17 0343 11/26/17 0600  BP: 135/90 105/67  103/68  Pulse: 73 70  67  Resp: 16 16  18   Temp: 98 F (36.7 C) 98 F (36.7 C)  97.9 F (36.6 C)  TempSrc: Oral Oral  Oral  SpO2: 98% 98%  100%  Weight:   93.7 kg     Intake/Output Summary (Last 24 hours) at 11/26/2017 0928 Last data filed at 11/26/2017 0000 Gross per 24 hour  Intake -  Output 0 ml  Net 0 ml   Filed Weights   11/26/17 0343  Weight: 93.7 kg    Telemetry    SR - Personally Reviewed  ECG      Physical Exam   GEN: No acute distress.   Neck: No JVD Cardiac: RRR, no murmurs, rubs, or gallops.  Respiratory: Clear to auscultation bilaterally. GI: Soft, nontender, non-distended  MS: No edema; No deformity. Neuro:  Nonfocal  Psych: Normal affect   Labs    Chemistry Recent Labs  Lab 11/25/17 1258 11/26/17 0152  NA 138 135  K 3.5 3.3*  CL 99 98  CO2 26 29  GLUCOSE 80 104*  BUN 18 17  CREATININE 1.13 1.27*  CALCIUM 9.7 9.0  GFRNONAA >60 >60  GFRAA >60 >60  ANIONGAP 13 8     Hematology Recent Labs  Lab 11/25/17 1258  11/26/17 0152  WBC 8.9 10.0  RBC 5.72 5.45  HGB 15.2 14.1  HCT 48.6 45.3  MCV 85.0 83.1  MCH 26.6 25.9*  MCHC 31.3 31.1  RDW 13.5 13.6  PLT 279 250    Cardiac Enzymes Recent Labs  Lab 11/25/17 1918 11/26/17 0152 11/26/17 0553  TROPONINI <0.03 <0.03 <0.03    Recent Labs  Lab 11/25/17 1301  TROPIPOC 0.01     BNPNo results for input(s): BNP, PROBNP in the last 168 hours.   DDimer No results for input(s): DDIMER in the last 168 hours.   Radiology    Dg Chest 2 View  Result Date: 11/25/2017 CLINICAL DATA:  Onset of chest pain today. Recent coronary stent placement in November 14, 2017. EXAM: CHEST - 2 VIEW COMPARISON:  PA and lateral chest x-ray of April 29, 2017 FINDINGS: The lungs are adequately inflated. There is no focal infiltrate. The interstitial markings are coarse but stable. The heart and pulmonary vascularity are normal. The mediastinum is normal in width. The ICD is in stable position. There is no pleural effusion. There is gentle dextrocurvature centered in  the midthoracic spine which is stable. IMPRESSION: Chronic bronchitic-smoking related changes, stable. No pneumonia, pulmonary edema, nor other acute cardiopulmonary abnormality. Electronically Signed   By: David  [place] M.D.   On: 11/25/2017 13:31    Cardiac Studies     Patient Profile     Jason Hogan is a 42 y.o. male with a history of CAD with large anterior MI and collateralized silent distal RCA from PCI in 2011 and recent PCI with balloon angioplasty to stented segment of LAD and DES x1 to mid circumflex on 11/14/2017, chronic systolic CHF with EF of 30-35% from Echo on 11/10/2017, ischemic cardiomyopathy s/p AICD, prior VT/ICD shock in 10/2016 (on Mexiletine), HTN, HLD, elevated sed rate followed by Rheumatology, and asthma who is being seen today for the evaluation of chest pain at the request of Dr. Ethelda Chick.   Assessment & Plan    1  Chest discomfort   Atypical    I am not sure what lead to  the spell he had yesterday and the night before   INterrogation of device negative    Trop negative   Some of spell sounded presyncopal His BP has been OK here   Low 100s    I encouraged him to stay hydrated    If dizzy again then would recomm he take BP   He ahs a cuff  2  CAD   Recent PCI 11/14/17    Enzymes negative    Has signif dz in RCA   Fills via collaterals   Again, may be compromised if BP low   Follow  3  Chronic systolic CHF   Volume looks OK on exam   Continue current meds \  4  HTN  Follow  5  Sinusitid  Clindamyciin is OK   7 days    OK to d/c from cardiac standpoint   F/U in clinic   For questions or updates, please contact [facility] Please consult [email or web address] for contact info under        Signed, Dietrich Pates, MD  11/26/2017, 9:28 AM

## 2017-11-26 NOTE — Progress Notes (Signed)
Assumed care on pt. , pt. resting with no distress/respirations unlabored , denies chest pain , call light within reach .

## 2017-11-26 NOTE — Discharge Summary (Signed)
Discharge Summary    Patient ID: Jason Hogan MRN: [id number]; DOB: 14-Mar-1975  Admit date: 11/25/2017 Discharge date: 11/26/2017  Primary Care Provider: Corwin Levins, MD  Primary Cardiologist: Charlton Haws, MD  Primary Electrophysiologist:  Sherryl Manges, MD   Discharge Diagnoses    Principal Problem:   Chest pain Active Problems:   Chronic systolic heart failure (HCC)   Ischemic cardiomyopathy s/p AICD   Hyperlipidemia   Hypertension   Asthma   Sinusitis, acute   CAD (coronary artery disease), native coronary artery   Allergies Allergies  Allergen Reactions  . Levaquin [Levofloxacin] Other (See Comments)    Joint pain  . Ace Inhibitors Cough  . Crestor [Rosuvastatin] Other (See Comments)    "Sweet cravings"  . Lunesta [Eszopiclone] Other (See Comments)    "Bitter taste in mouth"  . Zocor [Simvastatin] Other (See Comments)    Memory problem    Diagnostic Studies/Procedures    None   History of Present Illness     Patient called [facility] on 09/30/2017 with complaints of intermittent chest pain that he described as sharp pain worse that was worse with movement and exercise. He denies diaphoresis, dizziness, or dyspnea at that time. He underwent a stress test on 10/16/2017 which showed a fixed large, severe mid to apical anterior, mid to apical anteroseptal, apical inferior, and true apex perfusion defect. This was suggestive of an old infarction without significant ischemia and was read as a high risk study due to low EF (25%). Echo on 11/10/2017 showed EF of 30-35%. Right/left heart catheterization on 11/14/2017 showed patent LAD stent with focal area of severe narrowing within stent with successful PTCA with balloon angioplasty of the LAD stented segment and severe stenosis of mid circumflex with successful PTCA/DES x1. Also noted chronic occlusion of mid RCA (distal RCA fills from left to right collaterals). Patient returned to [facility] ED yesterday with chest  pain.  Since previous discharge on 11/15/2017 after catheterization, patient recovering from sinus infection and notes fatigue/decreased energy level. He notes intermittent feeling of "pins and needles" in his chest. He also reports palpitations two nights after being discharged on 11/17/2017 which he attributed to the Norel he took for his continued nasal congestion. Patient reports waking up multiple times throughout the night on 11/24/2017 feeling like his heart was racing. He checked his heart rate on his Apple watch, and his heart rate was in the upper 40s bpm to low 50s bpm. He would sit up for a minute and the feeling would resolve when his heart rate got up to about 70 bpm. This happened multiple times throughout the night to the point where the patient got out of bed and sat in a chair for a while. Patient states it felt like he was being "paced" last night and when he woke up his chest felt sore. He denies any ICD shocks at that time.  While he was driving to [place] for work yesterday morning, he developed substernal chest pressure/burning that he ranked as a 6/10 on the pain scale followed by a strong sense of "release" in his chest. He had associated mild shortness of breath, dizziness, nausea, and sweating with this and felt likely he briefly had tunnel vision and pressure in his head. When he got to work, he was walking with a client and felt "winded" and a little lightheaded. He states he "just did not feel right" so he left work and drove straight to the ED.  Upon arrival to  ED, vital signs stable. EKG showed sinus rhythm with no acute ST changes from prior tracings. I-stat troponin negative. Chest x-ray showed chronic bronchitic-smoking related changes but no acute processes. WBC 8.9, Hgb 15.2, Plts 279. Na 138, K 3.5, Glucose 80, Scr 1.13. Cardiology was consulted and we will admit patient overnight for observation.  Of note, patient feels like his sinus infection has not improved. He  completed his course of Augmentin but still reports nasal congestion, sinus pressure, and upper jaw pain.  Hospital Course     Patient admitted overnight for observation as stated above. Serial troponins negative x3. Discussed repeat EKG this morning with Dr. Tenny Craw and she did not feel like it significantly changed from prior tracing. Interrogation of AICD was negative. Patient denies any any angina or dizziness this morning.   It is unclear exactly what lead to his episode of chest pain and dizziness yesterday. Low blood pressure may have contributed to symptoms.   Per Dr. Tenny Craw, recommend monitoring and keeping a log of blood pressure. Patient advised to check blood pressure if he develops another episode of chest pain or dizziness. Also recommended staying hydrated as this may have contributed to his symptoms.   Patient to take Clindamycin 300mg  three times a day for 7 days for his sinusitis. If no improvement of symptoms, recommend following up with PCP.   Patient was seen this morning by Dr. Tenny Craw and determined stable for discharge. Follow up in the office has been arranged for 12/12/2017 at 8:00am with Nada Boozer, NP. Medications listed below.  Discharge Vitals Blood pressure 121/79, pulse 70, temperature 98.4 F (36.9 C), temperature source Oral, resp. rate 16, weight 93.7 kg, SpO2 97 %.  Filed Weights   11/26/17 0343  Weight: 93.7 kg    Labs & Radiologic Studies    CBC Recent Labs    11/25/17 1258 11/26/17 0152  WBC 8.9 10.0  HGB 15.2 14.1  HCT 48.6 45.3  MCV 85.0 83.1  PLT 279 250   Basic Metabolic Panel Recent Labs    09/81/19 1258 11/26/17 0152  NA 138 135  K 3.5 3.3*  CL 99 98  CO2 26 29  GLUCOSE 80 104*  BUN 18 17  CREATININE 1.13 1.27*  CALCIUM 9.7 9.0   Liver Function Tests No results for input(s): AST, ALT, ALKPHOS, BILITOT, PROT, ALBUMIN in the last 72 hours. No results for input(s): LIPASE, AMYLASE in the last 72 hours. Cardiac Enzymes Recent  Labs    11/25/17 1918 11/26/17 0152 11/26/17 0553  TROPONINI <0.03 <0.03 <0.03   BNP Invalid input(s): POCBNP D-Dimer No results for input(s): DDIMER in the last 72 hours. Hemoglobin A1C No results for input(s): HGBA1C in the last 72 hours. Fasting Lipid Panel No results for input(s): CHOL, HDL, LDLCALC, TRIG, CHOLHDL, LDLDIRECT in the last 72 hours. Thyroid Function Tests No results for input(s): TSH, T4TOTAL, T3FREE, THYROIDAB in the last 72 hours.  Invalid input(s): FREET3 _____________  Dg Chest 2 View  Result Date: 11/25/2017 CLINICAL DATA:  Onset of chest pain today. Recent coronary stent placement in November 14, 2017. EXAM: CHEST - 2 VIEW COMPARISON:  PA and lateral chest x-ray of April 29, 2017 FINDINGS: The lungs are adequately inflated. There is no focal infiltrate. The interstitial markings are coarse but stable. The heart and pulmonary vascularity are normal. The mediastinum is normal in width. The ICD is in stable position. There is no pleural effusion. There is gentle dextrocurvature centered in the midthoracic spine which is  stable. IMPRESSION: Chronic bronchitic-smoking related changes, stable. No pneumonia, pulmonary edema, nor other acute cardiopulmonary abnormality. Electronically Signed   By: David  [place] M.D.   On: 11/25/2017 13:31   Disposition   Patient is being discharged home today in good condition.  Follow-up Plans & Appointments    Follow-up Information    Leone Brand, NP Follow up.   Specialties:  Cardiology, Radiology Why:  Please keep hospital follow-up appointment scheduled for Friday December 12, 2017 at 8:00am with Nada Boozer. Vernona Rieger is one of the Nurse Practitioners with our Cardiology group.  Contact information: [address] [place] [address] [phone]          Discharge Instructions    Diet - low sodium heart healthy   Complete by:  As directed    Discharge instructions   Complete by:  As directed    We  have canceled your hospital follow-up appointment originally scheduled for tomorrow (11/27/2017) and have rescheduled it for Friday 12/12/2017 at 8:00am with Nada Boozer, NP.   Recommend monitoring and keeping a log of your blood pressure as that may have contributed to your symptoms. If you develop another episode of chest pain or dizziness, check your blood pressure at that time.   Also recommend staying hydrated as that may have contributed to your symptoms. A good rule of thumb would be up to 2L (you can also think about this as about 64oz).  Take Clindamycin 300mg  every 8 hours (3 times a day) for 7 days for your sinus infection. If no improvement after finishing this antibiotic, recommended seeing your primary care physician.   Increase activity slowly   Complete by:  As directed       Discharge Medications   Allergies as of 11/26/2017      Reactions   Levaquin [levofloxacin] Other (See Comments)   Joint pain   Ace Inhibitors Cough   Crestor [rosuvastatin] Other (See Comments)   "Sweet cravings"   Lunesta [eszopiclone] Other (See Comments)   "Bitter taste in mouth"   Zocor [simvastatin] Other (See Comments)   Memory problem      Medication List    STOP taking these medications   NOREL AD PO     TAKE these medications   albuterol 108 (90 Base) MCG/ACT inhaler Commonly known as:  PROVENTIL HFA;VENTOLIN HFA Inhale 1-2 puffs into the lungs every 6 (six) hours as needed for wheezing or shortness of breath.   ALPRAZolam 0.5 MG tablet Commonly known as:  XANAX Take 1 tablet (0.5 mg total) by mouth daily as needed for anxiety.   anastrozole 1 MG tablet Commonly known as:  ARIMIDEX Take 1 mg by mouth daily.   aspirin EC 81 MG tablet Take 81 mg by mouth daily.   atorvastatin 80 MG tablet Commonly known as:  LIPITOR Take 1 tablet (80 mg total) by mouth daily.   budesonide-formoterol 80-4.5 MCG/ACT inhaler Commonly known as:  SYMBICORT Inhale 2 puffs into the lungs 2  (two) times daily as needed (wheezing). What changed:  reasons to take this   carvedilol 6.25 MG tablet Commonly known as:  COREG TAKE 1 TABLET BY MOUTH TWICE A DAY   clindamycin 300 MG capsule Commonly known as:  CLEOCIN Take 1 capsule (300 mg total) by mouth every 8 (eight) hours. For 7 days.   Co Q 10 100 MG Caps Take 100 mg by mouth daily.   diphenhydrAMINE 25 MG tablet Commonly known as:  BENADRYL Take 50  mg by mouth every 6 (six) hours as needed (for nasal congestion and/or allergies).   ENTRESTO 24-26 MG Generic drug:  sacubitril-valsartan TAKE 1 TABLET BY MOUTH TWICE A DAY   fluticasone 50 MCG/ACT nasal spray Commonly known as:  FLONASE PLACE 2 SPRAYS INTO BOTH NOSTRILS DAILY AS NEEDED (SEASONAL ALLERGIES).   KLOR-CON M20 20 MEQ tablet Generic drug:  potassium chloride SA TAKE 2 TABLETS BY MOUTH EVERY DAY What changed:  how much to take   metolazone 5 MG tablet Commonly known as:  ZAROXOLYN TAKE 1 TABLET (5 MG TOTAL) BY MOUTH DAILY AS NEEDED (FOR EDEMA).   mexiletine 200 MG capsule Commonly known as:  MEXITIL Take 1 capsule (200 mg total) by mouth 2 (two) times daily.   nitroGLYCERIN 0.4 MG SL tablet Commonly known as:  NITROSTAT Place 1 tablet (0.4 mg total) under the tongue every 5 (five) minutes as needed for chest pain.   prasugrel 10 MG Tabs tablet Commonly known as:  EFFIENT Take 1 tablet (10 mg total) by mouth daily.   spironolactone 25 MG tablet Commonly known as:  ALDACTONE Take 0.5 tablets (12.5 mg total) by mouth daily. What changed:  when to take this   tiZANidine 4 MG tablet Commonly known as:  ZANAFLEX TAKE 1 TABLET BY MOUTH EVERY 6 HOURS AS NEEDED SPASMS OF THE MUSCLE What changed:    how much to take  how to take this  when to take this  additional instructions   torsemide 20 MG tablet Commonly known as:  DEMADEX Take 2 tablets (40 mg total) by mouth 2 (two) times daily.   traMADol 50 MG tablet Commonly known as:   ULTRAM Take 1 tablet (50 mg total) by mouth every 8 (eight) hours as needed. What changed:  reasons to take this          Outstanding Labs/Studies   None.  Duration of Discharge Encounter   Greater than 30 minutes including physician time.  Signed, Corrin Parker, PA-C 11/26/2017, 12:24 PM

## 2017-11-27 ENCOUNTER — Telehealth: Payer: Self-pay | Admitting: *Deleted

## 2017-11-27 ENCOUNTER — Ambulatory Visit: Payer: Self-pay | Admitting: Cardiology

## 2017-11-27 NOTE — Telephone Encounter (Signed)
Pt was on TCM report admitted 11/25/17 for observation for chest pain. Patient was seen by Dr. Tenny Craw and determined stable for discharge on 11/26/17. Pt will follow up in the office 12/12/2017 at 8:00am with Nada Boozer, NP.Marland KitchenShearon Stalls

## 2017-12-05 ENCOUNTER — Encounter (HOSPITAL_COMMUNITY): Payer: Self-pay | Admitting: *Deleted

## 2017-12-11 NOTE — Progress Notes (Signed)
Cardiology Office Note   Date:  12/12/2017   ID:  Jason Hogan, DOB Oct 10, 1975, MRN [id number]  PCP:  [name], MD  Cardiologist:  Dr. [name]    Chief Complaint  Patient presents with  . Coronary Artery Disease    new stent 11/14/08      History of Present Illness: Jason Hogan is a 42 y.o. male who presents for post hospitalization.     He has a history of CAD with large anterior MI and collateralized silent distal RCA from PCI in 2011 and recent Muse balloon angioplasty to stented segment of LAD and DES x1 to mid circumflex on 2/99/3716,RCVELFY systolic CHF with EF of 10-17% from Echo on 11/10/2017, ischemic cardiomyopathy s/p AICD, prior VT/ICD shock in 10/2016 (on Mexiletine), HTN, HLD, elevated sed rate followed by Rheumatology,and asthma   AICD implant complicated by hematoma  Has single chamber device with no counter so unable to assess palpitations    Had care at [facility] for a while and re established June 2017   Seen by Dr [name] 11/12/16  after AICD d/c;s  Found to have appropriate ATP and shock for VT. Has been seeing [name] at [facility] Less than 2% PVC burden last EP visit October 2018 with mexiletine   Also seen by ENT there 09/2014 for epistaxis with negative evaluation.  Seen by urology for Low T on clomid but caused irritability and then anastrazole  With improvement in fatigue and libido  Did not tolerate aldactone post m.i. with excessive fatiigue and also pruritis but on it now  Seen by Dr [name] and [name] for polyarthragia and hematuria.  Noted labs done 07/17/16 with ESR 88 and positive ANA UA with large Hb has had stones in past Cr stable .15  Malaise started beginning of May was Rx with levaquin but felt worse  Reviewed CT abdomen 07/05/16 normal no etiology for hematuria   Has right shoulder issue Can't have MRI to see if rotator cuff or labrum Seeing Dr Gerrie Nordmann NP August 15 with atypical chest pain in setting of  lifting weights and chronic lower back pain Some dysphagia to meat myovue reviewed from 10/16/17 EF estimated at 25% large anterior MI no ischemia Was to have f/u TTE to validate lower EF Has been on entresto, coreg and aldactone as well as demadex.   On last OV with Dr. [name] he was Having more exertional dyspnea and some chest tightness. This despite good diet weight loss And compliance with meds TTE from today suggests EF 30-35%  TTE today EF 30-35% will arrange right and left heart cath due to new symptoms and decreased EF by myovue Risks discussed willing to proceed   Pt had cardiac cath 11/14/17 and High-grade obstruction found in the circumflex successfully treated with stent and focal LAD ISR treated with angioplasty.  Both successful.  Pt discharged the next AM and HF had resolved as well.    Pt re-admitted 11/25/17 with chest pain.  Described a spins and needles in his chest.  Also heart racing. But the pins and needles turned into chest pressure and he came to ER   ICD interrogated with no arrhthymias. troponins neg.  euvolemic , recent sinusitis.  D/c'd 11/26/17.  Today he is feeling much better.  Once the sinuitis resolved his has had no further problems.  He has been more fatigued since procedure.  He goes to cardiac rehab next week.   He did take his BP  with these episodes and he would become hypotensive to 71/46 and 70/50 this was while lying down.  Today he is 98/72 but no lightheadedness or dizziness. No chest pain no real dyspnea like before new stent.  His statin was increased in hospital and we will recheck in Nov when he comes for OV with Dr. [name]. No feeling like his heart is racing.     Past Medical History:  Diagnosis Date  . Acute Myocardial Infarction 08/18/2009   Qualifier: Diagnosis of  By: [name]    . Allergic rhinitis, cause unspecified 10/03/2011  . Anxiety 10/03/2011  . Asthma 09/20/2006   Qualifier: Diagnosis of  By: [name], [name]   . Automatic  implantable cardiac defibrillator in situ 01/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 Qualifier: Diagnosis of  By: [name], MD, [name]   . CAD (coronary artery disease)   . Cholelithiasis 06/22/2007   Qualifier: Diagnosis of  By: [name]   . Chronic systolic heart failure ([place]) 09/18/2009   Qualifier: Diagnosis of  By: [name]    . Depressive disorder, not elsewhere classified 06/22/2007   Qualifier: Diagnosis of  By: [name]   . History of gout   . History of kidney stones   . Hyperlipidemia 81/2011  . Hypertension   . Impaired glucose tolerance 10/05/2012  . Insomnia, unspecified 06/22/2007   Qualifier: Diagnosis of  By: [name], [name]   . Ischemic cardiomyopathy 10/20/2009   Qualifier: Diagnosis of  By: [name], MD, [facility], Mack Guise   . 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 Migraine Headache 09/18/2009   Qualifier: Diagnosis of  By: [name]    . Renal calculus   . Renal Insufficiency 09/18/2009   Qualifier: Diagnosis of  By: [name]    . Respiratory failure ([place]) 08/2009  . Rhabdomyolysis 09/18/2009   Qualifier: Diagnosis of  By: [name]      Past Surgical History:  Procedure Laterality Date  . CARDIAC CATHETERIZATION N/A 05/17/2015   Procedure: Left Heart Cath and Coronary Angiography;  Surgeon: [name], MD;  Location: [facility];  Service: Cardiovascular;  Laterality: N/A;  . CARDIAC CATHETERIZATION  2012  . CORONARY ANGIOPLASTY WITH STENT PLACEMENT  2011  . CORONARY STENT INTERVENTION N/A 11/14/2017   Procedure: CORONARY STENT INTERVENTION;  Surgeon: Burnell Blanks, MD;  Location: [facility];  Service: Cardiovascular;  Laterality: N/A;  . ICD IMPLANT  11/2009   "w/pacing function"  . RIGHT/LEFT HEART CATH AND CORONARY ANGIOGRAPHY N/A 11/14/2017   Procedure: RIGHT/LEFT HEART CATH AND CORONARY ANGIOGRAPHY;  Surgeon: Burnell Blanks, MD;  Location: [facility];  Service: Cardiovascular;  Laterality: N/A;     Current Outpatient Medications  Medication Sig Dispense Refill  . albuterol (PROAIR HFA) 108 (90 Base) MCG/ACT inhaler Inhale 1-2 puffs into the lungs every 6 (six) hours as needed for wheezing or shortness of breath. 1 Inhaler 0  . ALPRAZolam (XANAX) 0.5 MG tablet Take 1 tablet (0.5 mg total) by mouth daily as needed for anxiety. 30 tablet 2  . anastrozole (ARIMIDEX) 1 MG tablet Take 1 mg by mouth daily.    [name] aspirin EC 81 MG tablet Take 81 mg by mouth daily.    [name] atorvastatin (LIPITOR) 80 MG tablet Take 1 tablet (80 mg total) by mouth daily. 90 tablet 3  . budesonide-formoterol (SYMBICORT) 80-4.5 MCG/ACT inhaler Inhale 2 puffs into the lungs 2 (two) times daily  as needed (wheezing). (Patient taking differently: Inhale 2 puffs into the lungs 2 (two) times daily as needed (for wheezing or flares). ) 1 Inhaler 11  . carvedilol (COREG) 6.25 MG tablet TAKE 1 TABLET BY MOUTH TWICE A DAY (Patient taking differently: Take 6.25 mg by mouth 2 (two) times daily. ) 180 tablet 1  . clindamycin (CLEOCIN) 300 MG capsule Take 1 capsule (300 mg total) by mouth every 8 (eight) hours. For 7 days. 21 capsule 0  . Coenzyme Q10 (CO Q 10) 100 MG CAPS Take 100 mg by mouth daily.     [name] ENTRESTO 24-26 MG TAKE 1 TABLET BY MOUTH TWICE A DAY (Patient taking differently: Take 1 tablet by mouth 2 (two) times daily. ) 180 tablet 3  . fluticasone (FLONASE) 50 MCG/ACT nasal spray PLACE 2 SPRAYS INTO BOTH NOSTRILS DAILY AS NEEDED (SEASONAL ALLERGIES). 16 g 3  . KLOR-CON M20 20 MEQ tablet TAKE 2 TABLETS BY MOUTH EVERY DAY (Patient taking differently: Take 40 mEq by mouth daily. ) 180 tablet 3  . metolazone (ZAROXOLYN) 5 MG tablet TAKE 1 TABLET (5 MG TOTAL) BY MOUTH DAILY AS NEEDED (FOR EDEMA). 30 tablet 9  . mexiletine (MEXITIL) 200 MG capsule Take 1 capsule (200 mg total) by mouth 2 (two) times daily. 60 capsule 7  . nitroGLYCERIN (NITROSTAT) 0.4 MG SL tablet Place  1 tablet (0.4 mg total) under the tongue every 5 (five) minutes as needed for chest pain. 90 tablet 3  . prasugrel (EFFIENT) 10 MG TABS tablet Take 1 tablet (10 mg total) by mouth daily. 90 tablet 3  . spironolactone (ALDACTONE) 25 MG tablet Take 0.5 tablets (12.5 mg total) by mouth daily. (Patient taking differently: Take 12.5 mg by mouth every evening. ) 45 tablet 3  . tiZANidine (ZANAFLEX) 4 MG tablet TAKE 1 TABLET BY MOUTH EVERY 6 HOURS AS NEEDED SPASMS OF THE MUSCLE (Patient taking differently: Take 4 mg by mouth every evening. ) 60 tablet 2  . torsemide (DEMADEX) 20 MG tablet Take 2 tablets (40 mg total) by mouth 2 (two) times daily. 120 tablet 11   No current facility-administered medications for this visit.     Allergies:   Levaquin [levofloxacin]; Ace inhibitors; Crestor [rosuvastatin]; Lunesta [eszopiclone]; and Zocor [simvastatin]    Social History:  The patient  reports that he quit smoking about 23 years ago. His smoking use included cigarettes. He has a 0.20 pack-year smoking history. He has never used smokeless tobacco. He reports that he drinks alcohol. He reports that he does not use drugs.   Family History:  The patient's family history includes Diabetes in his unknown relative; Heart attack in his father; Heart disease in his unknown relative; Hypothyroidism in his unknown relative.    ROS:  General:no colds or fevers, no weight changes Skin:no rashes or ulcers HEENT:no blurred vision, no congestion CV:see HPI PUL:see HPI GI:no diarrhea constipation or melena, no indigestion GU:no hematuria, no dysuria MS:no joint pain, no claudication Neuro:no syncope, no lightheadedness Endo:no diabetes, no thyroid disease  Wt Readings from Last 3 Encounters:  12/12/17 215 lb (97.5 kg)  11/26/17 206 lb 9.1 oz (93.7 kg)  11/15/17 216 lb 0.8 oz (98 kg)     PHYSICAL EXAM: VS:  BP 98/72   Pulse 84   Ht '6\' 2"'$  (1.88 m)   Wt 215 lb (97.5 kg)   BMI 27.60 kg/m  , BMI Body mass  index is 27.6 kg/m. General:Pleasant affect, NAD Skin:Warm and dry, brisk capillary refill  HEENT:normocephalic, sclera clear, mucus membranes moist Neck:supple, no JVD, no bruits  Heart:S1S2 RRR without murmur, gallup, rub or click Lungs:clear without rales, rhonchi, or wheezes PPJ:KDTO, non tender, + BS, do not palpate liver spleen or masses Ext:no lower ext edema, 2+ pedal pulses, 2+ radial pulses Neuro:alert and oriented, X 3 MAE, follows commands, + facial symmetry    EKG:  EKG is ordered today. The ekg ordered today demonstrates SR with Q waves ant. Leads, old from prior ant MI.     Recent Labs: 02/17/2017: NT-Pro BNP 104 04/29/2017: B Natriuretic Peptide 78.5 07/01/2017: ALT 21; TSH 1.25 10/02/2017: Magnesium 2.0 11/26/2017: BUN 17; Creatinine, Ser 1.27; Hemoglobin 14.1; Platelets 250; Potassium 3.3; Sodium 135    Lipid Panel    Component Value Date/Time   CHOL 149 07/01/2017 1430   TRIG 101.0 07/01/2017 1430   HDL 28.40 (L) 07/01/2017 1430   CHOLHDL 5 07/01/2017 1430   VLDL 20.2 07/01/2017 1430   LDLCALC 101 (H) 07/01/2017 1430   LDLDIRECT 101.0 10/24/2015 1554       Other studies Reviewed: Additional studies/ records that were reviewed today include:  ECHO 11/10/17. Study Conclusions  - Left ventricle: Septal apical and inferior hypokinesis. The   cavity size was moderately dilated. Wall thickness was normal.   Systolic function was moderately to severely reduced. The   estimated ejection fraction was in the range of 30% to 35%. Left   ventricular diastolic function parameters were normal. - Left atrium: The atrium was mildly dilated. - Atrial septum: No defect or patent foramen ovale was identified.  Cardiac cath 11/14/17   Prox RCA to Mid RCA lesion is 100% stenosed.  Prox Cx lesion is 99% stenosed.  A drug-eluting stent was successfully placed using a STENT SYNERGY DES 3.5X20.  Post intervention, there is a 0% residual stenosis.  Ost LAD to Prox  LAD lesion is 70% stenosed.  Balloon angioplasty was performed using a BALLOON SAPPHIRE [place] 3.0X8.  Post intervention, there is a 20% residual stenosis.   1. Patent LAD stent with focal area of severe narrowing within the stent. Successful PTCA with balloon angioplasty only of the LAD stented segment.  2. Severe stenosis mid Circumflex artery. Successful PTCA/DES x 1 mid Circumflex 3. Chronic occlusion mid RCA. The distal RCA fills from left to right collaterals.   Recommendations:   Recommend uninterrupted dual antiplatelet therapy with Aspirin '81mg'$  daily and Effient 10 mg daily for a minimum of 12 months (ACS - Class I recommendation).  ASSESSMENT AND PLAN:  1.  CAD with recent PCI to in stent restenosis of LAD with PTCA.  Continue ASA and effient. Plans to attend cardiac rehab, taking him longer to recover this admit.  Keep followup with Dr. [name]  2.  ICM with ICD.  euvolemic today, no ICD discharges  Also interrogated on second admit,  On torsemide, spironolactone, entresto- unable to increase due to BP. And coreg.    3.  HLD continue statin  4.  HTN controlled.to low, second hospitalization due to hypotension with combination of sinusitis and meds.    .       Current medicines are reviewed with the patient today.  The patient Has no concerns regarding medicines.  The following changes have been made:  See above Labs/ tests ordered today include:see above  Disposition:   FU:  see above  Signed, Cecilie Kicks, NP  12/12/2017 8:50 AM    [place], [place]  [place], [place] Phone: [phone]; Fax: [phone]

## 2017-12-12 ENCOUNTER — Encounter: Payer: Self-pay | Admitting: Cardiology

## 2017-12-12 ENCOUNTER — Ambulatory Visit (INDEPENDENT_AMBULATORY_CARE_PROVIDER_SITE_OTHER): Payer: Self-pay | Admitting: Cardiology

## 2017-12-12 VITALS — BP 98/72 | HR 84 | Ht 74.0 in | Wt 215.0 lb

## 2017-12-12 DIAGNOSIS — I5022 Chronic systolic (congestive) heart failure: Secondary | ICD-10-CM

## 2017-12-12 DIAGNOSIS — I25119 Atherosclerotic heart disease of native coronary artery with unspecified angina pectoris: Secondary | ICD-10-CM

## 2017-12-12 DIAGNOSIS — E7800 Pure hypercholesterolemia, unspecified: Secondary | ICD-10-CM

## 2017-12-12 DIAGNOSIS — Z9861 Coronary angioplasty status: Secondary | ICD-10-CM

## 2017-12-12 DIAGNOSIS — I255 Ischemic cardiomyopathy: Secondary | ICD-10-CM

## 2017-12-12 NOTE — Patient Instructions (Signed)
Medication Instructions:  Your physician recommends that you continue on your current medications as directed. Please refer to the Current Medication list given to you today.  If you need a refill on your cardiac medications before your next appointment, please call your pharmacy.   Lab work: 01/05/18:  CMET & FASTING LIPID  If you have labs (blood work) drawn today and your tests are completely normal, you will receive your results only by: [name] MyChart Message (if you have MyChart) OR . A paper copy in the mail If you have any lab test that is abnormal or we need to change your treatment, we will call you to review the results.  Testing/Procedures: None ordered  Follow-Up: Your physician recommends that you schedule a follow-up appointment in: 01/05/18 Keep your appointment with Dr. Eden Emms.  Make sure you come a little early to have your lab work done and remember come Fasting.   Any Other Special Instructions Will Be Listed Below (If Applicable).

## 2017-12-30 NOTE — Progress Notes (Signed)
Cardiology Office Note   Date:  01/05/2018   ID:  DYLLIN GULLEY, DOB [birth year], MRN [id number]  PCP:  [name], MD  Cardiologist:  Dr. [name]    No chief complaint on file.     History of Present Illness: Jason Hogan is a 42 y.o. male F/U for CAD and ischemic DCM with AICD     He has a history of CAD with large anterior MI and collateralized silent distal RCA from PCI in 2011 and recent PCIwith balloon angioplasty to stented segment of LAD and DES x1 to mid circumflex on 3/53/2992,EQASTMH systolic CHF with EF of 96-22% from Echo on 11/10/2017, ischemic cardiomyopathy s/p AICD, prior VT/ICD shock in 10/2016 (on Mexiletine), HTN, HLD, elevated sed rate followed by Rheumatology, History of allergies and asthma    AICD implant complicated by hematoma  Has single chamber device with no counter so unable to assess palpitations    Had care at [facility] for a while and re established June 2017   Seen by Dr [name] 11/12/16  after AICD d/c;s  Found to have appropriate ATP and shock for VT. Has been seeing [name] at [facility] Less than 2% PVC burden last EP visit October 2018 with mexiletine   Also seen by ENT there 09/2014 for epistaxis with negative evaluation.  Seen by urology for Low T on clomid but caused irritability and then anastrazole  With improvement in fatigue and libido  Did not tolerate aldactone post m.i. with excessive fatiigue and also pruritis but on it now  Seen by Dr [name] and [name] for polyarthragia and hematuria.  Noted labs done 07/17/16 with ESR 88 and positive ANA UA with large Hb has had stones in past Cr stable .31  Malaise started beginning of May was Rx with levaquin but felt worse  Reviewed CT abdomen 07/05/16 normal no etiology for hematuria   Has right shoulder issue Can't have MRI to see if rotator cuff or labrum Seeing Dr Gerrie Nordmann NP August 15 with atypical chest pain in setting of lifting weights and chronic lower back  pain Some dysphagia to meat myovue reviewed from 10/16/17 EF estimated at 25% large anterior MI no ischemia Was to have f/u TTE to validate lower EF Has been on entresto, coreg and aldactone as well as demadex.   September had more angina and dyspnea:  TTE with EF 30-35% septal apical and inferior wall hypokinesis   Pt had cardiac cath 11/14/17 and High-grade obstruction found in the circumflex successfully treated with stent and focal LAD ISR treated with angioplasty.  Both successful.  Re-admitted 11/25/17 with chest pain.  Described a spins and needles in his chest.  Also heart racing. But the pins and needles turned into chest pressure and he came to ER   ICD interrogated with no arrhthymias. troponins neg.  euvolemic d/c medical Rx   Slipped in shower and fracture left wrist including scaphoid Hand in splint  Depressed about long term prognosis   Past Medical History:  Diagnosis Date  . Acute Myocardial Infarction 08/18/2009   Qualifier: Diagnosis of  By: [name]    . Allergic rhinitis, cause unspecified 10/03/2011  . Anxiety 10/03/2011  . Asthma 09/20/2006   Qualifier: Diagnosis of  By: [name], [name]   . 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 Qualifier: Diagnosis of  By: [name], MD, [name]   . CAD (coronary artery disease)   .  Cholelithiasis 06/22/2007   Qualifier: Diagnosis of  By: [name]   . Chronic systolic heart failure ([employer]) 09/18/2009   Qualifier: Diagnosis of  By: [name]    . Depressive disorder, not elsewhere classified 06/22/2007   Qualifier: Diagnosis of  By: [name]   . History of gout   . History of kidney stones   . Hyperlipidemia 81/2011  . Hypertension   . Impaired glucose tolerance 10/05/2012  . Insomnia, unspecified 06/22/2007   Qualifier: Diagnosis of  By: [name], [name]   . Ischemic cardiomyopathy 10/20/2009   Qualifier:  Diagnosis of  By: [name], MD, [facility], Mack Guise   . 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 Migraine Headache 09/18/2009   Qualifier: Diagnosis of  By: [name]    . Renal calculus   . Renal Insufficiency 09/18/2009   Qualifier: Diagnosis of  By: [name]    . Respiratory failure ([place]) 08/2009  . Rhabdomyolysis 09/18/2009   Qualifier: Diagnosis of  By: [name]      Past Surgical History:  Procedure Laterality Date  . CARDIAC CATHETERIZATION N/A 05/17/2015   Procedure: Left Heart Cath and Coronary Angiography;  Surgeon: [name], MD;  Location: [facility];  Service: Cardiovascular;  Laterality: N/A;  . CARDIAC CATHETERIZATION  2012  . CORONARY ANGIOPLASTY WITH STENT PLACEMENT  2011  . CORONARY STENT INTERVENTION N/A 11/14/2017   Procedure: CORONARY STENT INTERVENTION;  Surgeon: Burnell Blanks, MD;  Location: [facility];  Service: Cardiovascular;  Laterality: N/A;  . ICD IMPLANT  11/2009   "w/pacing function"  . RIGHT/LEFT HEART CATH AND CORONARY ANGIOGRAPHY N/A 11/14/2017   Procedure: RIGHT/LEFT HEART CATH AND CORONARY ANGIOGRAPHY;  Surgeon: Burnell Blanks, MD;  Location: [facility];  Service: Cardiovascular;  Laterality: N/A;     Current Outpatient Medications  Medication Sig Dispense Refill  . albuterol (PROAIR HFA) 108 (90 Base) MCG/ACT inhaler Inhale 1-2 puffs into the lungs every 6 (six) hours as needed for wheezing or shortness of breath. 1 Inhaler 0  . ALPRAZolam (XANAX) 0.5 MG tablet Take 1 tablet (0.5 mg total) by mouth daily as needed for anxiety. 30 tablet 2  . anastrozole (ARIMIDEX) 1 MG tablet Take 1 mg by mouth daily.    [name] aspirin EC 81 MG tablet Take 81 mg by mouth daily.    [name] atorvastatin (LIPITOR) 80 MG tablet Take 1 tablet (80 mg total) by mouth daily. 90 tablet 3  . budesonide-formoterol (SYMBICORT) 80-4.5 MCG/ACT inhaler Inhale 2 puffs into the lungs 2 (two) times daily as needed (wheezing). (Patient  taking differently: Inhale 2 puffs into the lungs 2 (two) times daily as needed (for wheezing or flares). ) 1 Inhaler 11  . carvedilol (COREG) 6.25 MG tablet TAKE 1 TABLET BY MOUTH TWICE A DAY (Patient taking differently: Take 6.25 mg by mouth 2 (two) times daily. ) 180 tablet 1  . clindamycin (CLEOCIN) 300 MG capsule Take 1 capsule (300 mg total) by mouth every 8 (eight) hours. For 7 days. 21 capsule 0  . Coenzyme Q10 (CO Q 10) 100 MG CAPS Take 100 mg by mouth daily.     [name] ENTRESTO 24-26 MG TAKE 1 TABLET BY MOUTH TWICE A DAY (Patient taking differently: Take 1 tablet by mouth 2 (two) times daily. ) 180 tablet 3  . fluticasone (FLONASE) 50 MCG/ACT nasal spray PLACE 2 SPRAYS INTO BOTH NOSTRILS DAILY AS NEEDED (SEASONAL ALLERGIES). 16 g 3  .  KLOR-CON M20 20 MEQ tablet TAKE 2 TABLETS BY MOUTH EVERY DAY (Patient taking differently: Take 40 mEq by mouth daily. ) 180 tablet 3  . metolazone (ZAROXOLYN) 5 MG tablet TAKE 1 TABLET (5 MG TOTAL) BY MOUTH DAILY AS NEEDED (FOR EDEMA). 30 tablet 9  . mexiletine (MEXITIL) 200 MG capsule Take 1 capsule (200 mg total) by mouth 2 (two) times daily. 60 capsule 7  . prasugrel (EFFIENT) 10 MG TABS tablet Take 1 tablet (10 mg total) by mouth daily. 90 tablet 3  . spironolactone (ALDACTONE) 25 MG tablet Take 0.5 tablets (12.5 mg total) by mouth daily. (Patient taking differently: Take 12.5 mg by mouth every evening. ) 45 tablet 3  . tiZANidine (ZANAFLEX) 4 MG tablet TAKE 1 TABLET BY MOUTH EVERY 6 HOURS AS NEEDED SPASMS OF THE MUSCLE (Patient taking differently: Take 4 mg by mouth every evening. ) 60 tablet 2  . torsemide (DEMADEX) 20 MG tablet Take 2 tablets (40 mg total) by mouth 2 (two) times daily. 120 tablet 11  . traMADol (ULTRAM) 50 MG tablet Take 1 tablet (50 mg total) by mouth every 12 (twelve) hours as needed. 30 tablet 0  . Vitamin D, Ergocalciferol, (DRISDOL) 1.25 MG (50000 UT) CAPS capsule Take 1 capsule (50,000 Units total) by mouth every 7 (seven) days. 12  capsule 0  . nitroGLYCERIN (NITROSTAT) 0.4 MG SL tablet Place 1 tablet (0.4 mg total) under the tongue every 5 (five) minutes as needed for chest pain. 90 tablet 3   No current facility-administered medications for this visit.     Allergies:   Levaquin [levofloxacin]; Ace inhibitors; Crestor [rosuvastatin]; Lunesta [eszopiclone]; and Zocor [simvastatin]    Social History:  The patient  reports that he quit smoking about 23 years ago. His smoking use included cigarettes. He has a 0.20 pack-year smoking history. He has never used smokeless tobacco. He reports that he drinks alcohol. He reports that he does not use drugs.   Family History:  The patient's family history includes Diabetes in his unknown relative; Heart attack in his father; Heart disease in his unknown relative; Hypothyroidism in his unknown relative.    ROS:  General:no colds or fevers, no weight changes Skin:no rashes or ulcers HEENT:no blurred vision, no congestion CV:see HPI PUL:see HPI GI:no diarrhea constipation or melena, no indigestion GU:no hematuria, no dysuria MS:no joint pain, no claudication Neuro:no syncope, no lightheadedness Endo:no diabetes, no thyroid disease  Wt Readings from Last 3 Encounters:  01/05/18 218 lb 12 oz (99.2 kg)  01/01/18 215 lb (97.5 kg)  12/12/17 215 lb (97.5 kg)     PHYSICAL EXAM: VS:  BP 104/66   Pulse 85   Ht _0  (1.88 m)   Wt 218 lb 12 oz (99.2 kg) Comment: 211 @ home  SpO2 96%   BMI 28.09 kg/m  , BMI Body mass index is 28.09 kg/m. Affect appropriate Healthy:  appears stated age 74: normal Neck supple with no adenopathy JVP normal no bruits no thyromegaly Lungs clear with no wheezing and good diaphragmatic motion Heart:  S1/S2 no murmur, no rub, gallop or click PMI enlarged AICD under left clavicle  Abdomen: benighn, BS positve, no tenderness, no AAA no bruit.  No HSM or HJR Distal pulses intact with no bruits No edema Neuro non-focal Skin warm and dry No  muscular weakness     EKG:   12/12/17 SR rate 31 possible old anterior MI no q waves poor R wave progression    Recent Labs:  02/17/2017: NT-Pro BNP 104 04/29/2017: B Natriuretic Peptide 78.5 07/01/2017: ALT 21; TSH 1.25 10/02/2017: Magnesium 2.0 11/26/2017: BUN 17; Creatinine, Ser 1.27; Hemoglobin 14.1; Platelets 250; Potassium 3.3; Sodium 135    Lipid Panel    Component Value Date/Time   CHOL 149 07/01/2017 1430   TRIG 101.0 07/01/2017 1430   HDL 28.40 (L) 07/01/2017 1430   CHOLHDL 5 07/01/2017 1430   VLDL 20.2 07/01/2017 1430   LDLCALC 101 (H) 07/01/2017 1430   LDLDIRECT 101.0 10/24/2015 1554       Other studies Reviewed: Additional studies/ records that were reviewed today include:  ECHO 11/10/17. Study Conclusions  - Left ventricle: Septal apical and inferior hypokinesis. The   cavity size was moderately dilated. Wall thickness was normal.   Systolic function was moderately to severely reduced. The   estimated ejection fraction was in the range of 30% to 35%. Left   ventricular diastolic function parameters were normal. - Left atrium: The atrium was mildly dilated. - Atrial septum: No defect or patent foramen ovale was identified.  Cardiac cath 11/14/17   Prox RCA to Mid RCA lesion is 100% stenosed.  Prox Cx lesion is 99% stenosed.  A drug-eluting stent was successfully placed using a STENT SYNERGY DES 3.5X20.  Post intervention, there is a 0% residual stenosis.  Ost LAD to Prox LAD lesion is 70% stenosed.  Balloon angioplasty was performed using a BALLOON SAPPHIRE [place] 3.0X8.  Post intervention, there is a 20% residual stenosis.   1. Patent LAD stent with focal area of severe narrowing within the stent. Successful PTCA with balloon angioplasty only of the LAD stented segment.  2. Severe stenosis mid Circumflex artery. Successful PTCA/DES x 1 mid Circumflex 3. Chronic occlusion mid RCA. The distal RCA fills from left to right collaterals.    Recommendations:   Recommend uninterrupted dual antiplatelet therapy with Aspirin 31m daily and Effient 10 mg daily for a minimum of 12 months (ACS - Class I recommendation).  ASSESSMENT AND PLAN:  1.  CAD  Chronically occluded mid RCA with collaterals. ISR LAD stent with PCI 11/14/17 and new stent to circumflex at same time Given 3 vessel CAD continue life long DAT Using Effient   2.  ICM with ICD.  euvolemic today, no ICD discharges  Also interrogated on second admit,  On torsemide, coreg,  spironolactone, entresto- unable to increase due to BP.      3.  HLD continue statin  4.  HTN controlled.to low, second hospitalization due to hypotension with combination of sinusitis and meds.    5. Rheumatology:  History of positive ANA and elevated ESR as well as asthma. History of sinusitis and myalgias etiology unclear  Last ESR May 2019 48   6 Ortho:  Does not need surgery for left wrist No surgery on right shoulder for at least 6 months post Recent stent   Current medicines are reviewed with the patient today.  The patient Has no concerns regarding medicines.  The following changes have been made:  See above Labs/ tests ordered today include:see above  Disposition:   FU:  see above  Signed, PJenkins Rouge MD  01/05/2018 9:45 AM    [place]askaPhone: [phone] Fax: [phone]

## 2018-01-01 ENCOUNTER — Ambulatory Visit: Payer: Self-pay | Admitting: Family Medicine

## 2018-01-01 ENCOUNTER — Ambulatory Visit: Payer: Self-pay

## 2018-01-01 ENCOUNTER — Ambulatory Visit (INDEPENDENT_AMBULATORY_CARE_PROVIDER_SITE_OTHER)
Admission: RE | Admit: 2018-01-01 | Discharge: 2018-01-01 | Disposition: A | Discharge status: Home / Self Care | Payer: Self-pay | Source: Ambulatory Visit | Attending: Family Medicine | Admitting: Family Medicine

## 2018-01-01 ENCOUNTER — Encounter: Payer: Self-pay | Admitting: Family Medicine

## 2018-01-01 VITALS — BP 110/82 | HR 102 | Ht 74.0 in | Wt 215.0 lb

## 2018-01-01 DIAGNOSIS — S62102A Fracture of unspecified carpal bone, left wrist, initial encounter for closed fracture: Secondary | ICD-10-CM | POA: Insufficient documentation

## 2018-01-01 DIAGNOSIS — M79642 Pain in left hand: Secondary | ICD-10-CM

## 2018-01-01 MED ORDER — TRAMADOL HCL 50 MG PO TABS: 50.0000 mg | ORAL_TABLET | Freq: Two times a day (BID) | ORAL | 0 refills | Status: DC | PRN

## 2018-01-01 MED ORDER — VITAMIN D (ERGOCALCIFEROL) 1.25 MG (50000 UNIT) PO CAPS: 50000.0000 [IU] | ORAL_CAPSULE | ORAL | 0 refills | Status: DC

## 2018-01-01 NOTE — Assessment & Plan Note (Signed)
Concern for scaphoid fracture as well as a triquetrum fracture.  Could be even a distal radius fracture with hematoma.  X-rays ordered today.  Discussed icing regimen and home exercise.  Discussed which activities to do which wants to avoid.  Patient was put in a brace at this point.  Patient continues to have discomfort will need to monitor.  Patient has had many different problems over the course of time and with patient's underlying concern we will monitor closely.  Following up again in 2 weeks

## 2018-01-01 NOTE — Progress Notes (Signed)
[name] [address] [place], [place] [address] Phone: [phone] Subjective:       CC: Left hand and wrist pain  XID:[id number]  Jason Hogan is a 42 y.o. male coming in with complaint of left hand pain. Patient was stepping into shower yesterday and fell catching himself. Pain over the thumb and 2nd and 3rd MCP. Hand is numb and cold. Discoloration and swelling present. Patient did try heat yesterday.  Patient states that the pain is 7 out of 10.    Past Medical History:  Diagnosis Date  . Acute Myocardial Infarction 08/18/2009   Qualifier: Diagnosis of  By: Kem Parkinson    . Allergic rhinitis, cause unspecified 10/03/2011  . Anxiety 10/03/2011  . Asthma 09/20/2006   Qualifier: Diagnosis of  By: Jonny Ruiz MD, Len Blalock   . 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 Qualifier: Diagnosis of  By: Eden Emms, MD, Harrington Challenger   . CAD (coronary artery disease)   . Cholelithiasis 06/22/2007   Qualifier: Diagnosis of  By: Maris Berger   . Chronic systolic heart failure (HCC) 09/18/2009   Qualifier: Diagnosis of  By: Kem Parkinson    . Depressive disorder, not elsewhere classified 06/22/2007   Qualifier: Diagnosis of  By: Maris Berger   . History of gout   . History of kidney stones   . Hyperlipidemia 81/2011  . Hypertension   . Impaired glucose tolerance 10/05/2012  . Insomnia, unspecified 06/22/2007   Qualifier: Diagnosis of  By: Jonny Ruiz MD, Len Blalock   . Ischemic cardiomyopathy 10/20/2009   Qualifier: Diagnosis of  By: Graciela Husbands, MD, [facility], Ty Hilts   . 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 Migraine Headache 09/18/2009   Qualifier: Diagnosis of  By: Kem Parkinson    . Renal calculus   . Renal Insufficiency 09/18/2009   Qualifier: Diagnosis of  By: Kem Parkinson    . Respiratory failure (HCC) 08/2009  . Rhabdomyolysis 09/18/2009   Qualifier: Diagnosis of  By:  Kem Parkinson     Past Surgical History:  Procedure Laterality Date  . CARDIAC CATHETERIZATION N/A 05/17/2015   Procedure: Left Heart Cath and Coronary Angiography;  Surgeon: Wendall Stade, MD;  Location: [facility];  Service: Cardiovascular;  Laterality: N/A;  . CARDIAC CATHETERIZATION  2012  . CORONARY ANGIOPLASTY WITH STENT PLACEMENT  2011  . CORONARY STENT INTERVENTION N/A 11/14/2017   Procedure: CORONARY STENT INTERVENTION;  Surgeon: Kathleene Hazel, MD;  Location: [facility];  Service: Cardiovascular;  Laterality: N/A;  . ICD IMPLANT  11/2009   "w/pacing function"  . RIGHT/LEFT HEART CATH AND CORONARY ANGIOGRAPHY N/A 11/14/2017   Procedure: RIGHT/LEFT HEART CATH AND CORONARY ANGIOGRAPHY;  Surgeon: Kathleene Hazel, MD;  Location: [facility];  Service: Cardiovascular;  Laterality: N/A;   Social History   Socioeconomic History  . Marital status: Married    Spouse name: Not on file  . Number of children: 0  . Years of education: Not on file  . Highest education level: Not on file  Occupational History  . 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 Financial resource strain: Not on file  . Food insecurity:    Worry: Not on file    Inability: Not on file  . Transportation needs:    Medical: Not on file    Non-medical: Not on file  Tobacco Use  .  Smoking status: Former Smoker    Packs/day: 0.10    Years: 2.00    Pack years: 0.20    Types: Cigarettes    Last attempt to quit: 02/18/1994    Years since quitting: 23.8  . Smokeless tobacco: Never Used  Substance and Sexual Activity  . Alcohol use: Yes    Comment: 11/25/2017 "couple beers/month; if that"  . Drug use: Never  . Sexual activity: Yes  Lifestyle  . Physical activity:    Days per week: Not on file    Minutes per session: Not on file  . Stress: Not on file  Relationships  . Social connections:    Talks on phone: Not on file    Gets together: Not on  file    Attends religious service: Not on file    Active member of club or organization: Not on file    Attends meetings of clubs or organizations: Not on file    Relationship status: Not on file  Other Topics Concern  . Not on file  Social History Narrative  . Not on file   Allergies  Allergen Reactions  . Levaquin [Levofloxacin] Other (See Comments)    Joint pain  . Ace Inhibitors Cough  . Crestor [Rosuvastatin] Other (See Comments)    "Sweet cravings"  . Lunesta [Eszopiclone] Other (See Comments)    "Bitter taste in mouth"  . Zocor [Simvastatin] Other (See Comments)    Memory problem   Family History  Problem Relation Age of Onset  . Heart attack Father   . Heart disease Unknown   . Diabetes Unknown   . Hypothyroidism Unknown      Current Outpatient Medications (Cardiovascular):  .  atorvastatin (LIPITOR) 80 MG tablet, Take 1 tablet (80 mg total) by mouth daily. .  carvedilol (COREG) 6.25 MG tablet, TAKE 1 TABLET BY MOUTH TWICE A DAY (Patient taking differently: Take 6.25 mg by mouth 2 (two) times daily. ) .  ENTRESTO 24-26 MG, TAKE 1 TABLET BY MOUTH TWICE A DAY (Patient taking differently: Take 1 tablet by mouth 2 (two) times daily. ) .  metolazone (ZAROXOLYN) 5 MG tablet, TAKE 1 TABLET (5 MG TOTAL) BY MOUTH DAILY AS NEEDED (FOR EDEMA). .  mexiletine (MEXITIL) 200 MG capsule, Take 1 capsule (200 mg total) by mouth 2 (two) times daily. [name]  spironolactone (ALDACTONE) 25 MG tablet, Take 0.5 tablets (12.5 mg total) by mouth daily. (Patient taking differently: Take 12.5 mg by mouth every evening. ) .  torsemide (DEMADEX) 20 MG tablet, Take 2 tablets (40 mg total) by mouth 2 (two) times daily. .  nitroGLYCERIN (NITROSTAT) 0.4 MG SL tablet, Place 1 tablet (0.4 mg total) under the tongue every 5 (five) minutes as needed for chest pain.  Current Outpatient Medications (Respiratory):  .  albuterol (PROAIR HFA) 108 (90 Base) MCG/ACT inhaler, Inhale 1-2 puffs into the lungs every 6  (six) hours as needed for wheezing or shortness of breath. .  budesonide-formoterol (SYMBICORT) 80-4.5 MCG/ACT inhaler, Inhale 2 puffs into the lungs 2 (two) times daily as needed (wheezing). (Patient taking differently: Inhale 2 puffs into the lungs 2 (two) times daily as needed (for wheezing or flares). ) .  fluticasone (FLONASE) 50 MCG/ACT nasal spray, PLACE 2 SPRAYS INTO BOTH NOSTRILS DAILY AS NEEDED (SEASONAL ALLERGIES).  Current Outpatient Medications (Analgesics):  .  aspirin EC 81 MG tablet, Take 81 mg by mouth daily. .  traMADol (ULTRAM) 50 MG tablet, Take 1 tablet (50 mg total) by mouth every  12 (twelve) hours as needed.  Current Outpatient Medications (Hematological):  .  prasugrel (EFFIENT) 10 MG TABS tablet, Take 1 tablet (10 mg total) by mouth daily.  Current Outpatient Medications (Other):  [name]  ALPRAZolam (XANAX) 0.5 MG tablet, Take 1 tablet (0.5 mg total) by mouth daily as needed for anxiety. [name]  anastrozole (ARIMIDEX) 1 MG tablet, Take 1 mg by mouth daily. .  clindamycin (CLEOCIN) 300 MG capsule, Take 1 capsule (300 mg total) by mouth every 8 (eight) hours. For 7 days. .  Coenzyme Q10 (CO Q 10) 100 MG CAPS, Take 100 mg by mouth daily.  [name]  KLOR-CON M20 20 MEQ tablet, TAKE 2 TABLETS BY MOUTH EVERY DAY (Patient taking differently: Take 40 mEq by mouth daily. ) .  tiZANidine (ZANAFLEX) 4 MG tablet, TAKE 1 TABLET BY MOUTH EVERY 6 HOURS AS NEEDED SPASMS OF THE MUSCLE (Patient taking differently: Take 4 mg by mouth every evening. ) .  Vitamin D, Ergocalciferol, (DRISDOL) 1.25 MG (50000 UT) CAPS capsule, Take 1 capsule (50,000 Units total) by mouth every 7 (seven) days.    Past medical history, social, surgical and family history all reviewed in electronic medical record.  No pertanent information unless stated regarding to the chief complaint.   Review of Systems:  No headache, visual changes, nausea, vomiting, diarrhea, constipation, dizziness, abdominal pain, skin rash, fevers,  chills, night sweats, weight loss, swollen lymph nodes, body aches,chest pain, shortness of breath, mood changes.  Positive muscle aches and joint swelling  Objective  Blood pressure 110/82, pulse (!) 102, height 6\' 2"  (1.88 m), weight 215 lb (97.5 kg), SpO2 97 %.    General: No apparent distress alert and oriented x3 mood and affect normal, dressed appropriately.  HEENT: Pupils equal, extraocular movements intact  Respiratory: Patient's speak in full sentences and does not appear short of breath  Cardiovascular: No lower extremity edema, non tender, no erythema  Skin: Warm dry intact with no signs of infection or rash on extremities or on axial skeleton.  Abdomen: Soft nontender  Neuro: Cranial nerves II through XII are intact, neurovascularly intact in all extremities with 2+ DTRs and 2+ pulses.  Lymph: No lymphadenopathy of posterior or anterior cervical chain or axillae bilaterally.  Gait normal with good balance and coordination.  MSK:  Non tender with full range of motion and good stability and symmetric strength and tone of shoulders, elbows, , hip, knee and ankles bilaterally.  Left hand exam shows significant amount of swelling noted.  Patient is tender to palpation over the third metacarpal proximally as well as the triquetrum pain.  Severe tenderness over the scaphoid bone in the anatomical snuffbox.  Patient neurovascularly is intact but has decreased motion of the finger secondary to swelling.  Limited musculoskeletal ultrasound by Judi Saa  Limited muscular skeletal of patient's wrist shows the patient does have what appears to be a avulsion fracture of the triquetrum bone.  In addition of the scaphoid does have an abnormality with a significant swelling noted and increasing Doppler flow.  Hematoma also over the distal aspect of the wrist noted.  Patient's distal radius though does appear intact.  Impression: Triquetral and likely scaphoid fracture     Impression and  Recommendations:     The above documentation has been reviewed and is accurate and complete Judi Saa, DO       Note: This dictation was prepared with Dragon dictation along with smaller phrase technology. Any transcriptional errors that result from this  process are unintentional.

## 2018-01-01 NOTE — Patient Instructions (Signed)
Good to see you  Ice is you [email or web address] Once weekly vitamin D for 12 weeks again  Wear the brace day and night as much you can  Xray downstairs See me again in 2 weeks (Ok to double book)

## 2018-01-05 ENCOUNTER — Ambulatory Visit (INDEPENDENT_AMBULATORY_CARE_PROVIDER_SITE_OTHER): Payer: Self-pay | Admitting: Cardiovascular Disease

## 2018-01-05 ENCOUNTER — Encounter: Payer: Self-pay | Admitting: Cardiovascular Disease

## 2018-01-05 ENCOUNTER — Other Ambulatory Visit: Payer: Self-pay | Admitting: *Deleted

## 2018-01-05 VITALS — BP 104/66 | HR 85 | Ht 74.0 in | Wt 218.8 lb

## 2018-01-05 DIAGNOSIS — I10 Essential (primary) hypertension: Secondary | ICD-10-CM

## 2018-01-05 DIAGNOSIS — I255 Ischemic cardiomyopathy: Secondary | ICD-10-CM

## 2018-01-05 DIAGNOSIS — I25119 Atherosclerotic heart disease of native coronary artery with unspecified angina pectoris: Secondary | ICD-10-CM

## 2018-01-05 DIAGNOSIS — Z9861 Coronary angioplasty status: Secondary | ICD-10-CM

## 2018-01-05 DIAGNOSIS — E7800 Pure hypercholesterolemia, unspecified: Secondary | ICD-10-CM

## 2018-01-05 DIAGNOSIS — I5022 Chronic systolic (congestive) heart failure: Secondary | ICD-10-CM

## 2018-01-05 LAB — COMPREHENSIVE METABOLIC PANEL
ALK PHOS: 53 IU/L (ref 39–117)
ALT: 53 IU/L — AB (ref 0–44)
AST: 35 IU/L (ref 0–40)
Albumin/Globulin Ratio: 1.7 (ref 1.2–2.2)
Albumin: 4.3 g/dL (ref 3.5–5.5)
BUN/Creatinine Ratio: 7 — ABNORMAL LOW (ref 9–20)
BUN: 9 mg/dL (ref 6–24)
Bilirubin Total: 0.6 mg/dL (ref 0.0–1.2)
CALCIUM: 9.2 mg/dL (ref 8.7–10.2)
CO2: 26 mmol/L (ref 20–29)
CREATININE: 1.38 mg/dL — AB (ref 0.76–1.27)
Chloride: 97 mmol/L (ref 96–106)
GFR calc Af Amer: 73 mL/min/{1.73_m2} (ref >59)
GFR, EST NON AFRICAN AMERICAN: 63 mL/min/{1.73_m2} (ref >59)
GLOBULIN, TOTAL: 2.5 g/dL (ref 1.5–4.5)
Glucose: 84 mg/dL (ref 65–99)
Potassium: 3.9 mmol/L (ref 3.5–5.2)
Sodium: 139 mmol/L (ref 134–144)
Total Protein: 6.8 g/dL (ref 6.0–8.5)

## 2018-01-05 LAB — LIPID PANEL
CHOL/HDL RATIO: 3.5 ratio (ref 0.0–5.0)
CHOLESTEROL TOTAL: 104 mg/dL (ref 100–199)
HDL: 30 mg/dL — AB (ref >39)
LDL CALC: 61 mg/dL (ref 0–99)
TRIGLYCERIDES: 63 mg/dL (ref 0–149)
VLDL CHOLESTEROL CAL: 13 mg/dL (ref 5–40)

## 2018-01-05 NOTE — Patient Instructions (Signed)

## 2018-01-07 ENCOUNTER — Other Ambulatory Visit: Payer: Self-pay | Admitting: Cardiovascular Disease

## 2018-01-07 MED ORDER — MEXILETINE HCL 200 MG PO CAPS: 200.0000 mg | ORAL_CAPSULE | Freq: Two times a day (BID) | ORAL | 0 refills | Status: DC

## 2018-01-08 ENCOUNTER — Telehealth: Payer: Self-pay

## 2018-01-08 DIAGNOSIS — Z79899 Other long term (current) drug therapy: Secondary | ICD-10-CM

## 2018-01-08 DIAGNOSIS — I5022 Chronic systolic (congestive) heart failure: Secondary | ICD-10-CM

## 2018-01-08 NOTE — Telephone Encounter (Signed)
-----   Message from Leone Brand, NP sent at 01/06/2018  9:12 PM EST ----- Stable but recheck in 2 weeks to check BMP- kidney function alone.

## 2018-01-08 NOTE — Telephone Encounter (Signed)
Pt advised lab results and will return 01/22/18 for repeat lab.

## 2018-01-14 ENCOUNTER — Ambulatory Visit: Payer: Self-pay

## 2018-01-14 ENCOUNTER — Ambulatory Visit (INDEPENDENT_AMBULATORY_CARE_PROVIDER_SITE_OTHER): Payer: Self-pay | Admitting: Family Medicine

## 2018-01-14 VITALS — BP 102/76 | HR 83 | Ht 74.0 in | Wt 219.0 lb

## 2018-01-14 DIAGNOSIS — M79642 Pain in left hand: Secondary | ICD-10-CM

## 2018-01-14 DIAGNOSIS — S62102A Fracture of unspecified carpal bone, left wrist, initial encounter for closed fracture: Secondary | ICD-10-CM

## 2018-01-14 NOTE — Progress Notes (Signed)
[facility] [address] [place], [place] [address] Phone: [phone] Subjective:        CC: Hand and wrist pain follow-up  [id number]  Jason Hogan is a 42 y.o. male coming in with complaint of left hand pain. Patient has been wearing the brace since we last saw him. Does come out of it to work on ROM. Swelling has decreased. Painful with ROM. Pain between the 2nd and 3rd metacarpal.  Patient at last exam did have what appeared to be a fracture of the scaphoid mid body as well as a triquetrium fracture moderately displaced has been wearing the brace regularly.  Feels like he is making progress.     Past Medical History:  Diagnosis Date  . Acute Myocardial Infarction 08/18/2009   Qualifier: Diagnosis of  By: Kem Parkinson    . Allergic rhinitis, cause unspecified 10/03/2011  . Anxiety 10/03/2011  . Asthma 09/20/2006   Qualifier: Diagnosis of  By: Jonny Ruiz MD, Len Blalock   . 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 Qualifier: Diagnosis of  By: Eden Emms, MD, Harrington Challenger   . CAD (coronary artery disease)   . Cholelithiasis 06/22/2007   Qualifier: Diagnosis of  By: Maris Berger   . Chronic systolic heart failure (HCC) 09/18/2009   Qualifier: Diagnosis of  By: Kem Parkinson    . Depressive disorder, not elsewhere classified 06/22/2007   Qualifier: Diagnosis of  By: Maris Berger   . History of gout   . History of kidney stones   . Hyperlipidemia 81/2011  . Hypertension   . Impaired glucose tolerance 10/05/2012  . Insomnia, unspecified 06/22/2007   Qualifier: Diagnosis of  By: Jonny Ruiz MD, Len Blalock   . Ischemic cardiomyopathy 10/20/2009   Qualifier: Diagnosis of  By: Graciela Husbands, MD, [facility], Ty Hilts   . 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 Migraine Headache 09/18/2009   Qualifier: Diagnosis of  By: Kem Parkinson    . Renal calculus   . Renal Insufficiency 09/18/2009    Qualifier: Diagnosis of  By: Kem Parkinson    . Respiratory failure (HCC) 08/2009  . Rhabdomyolysis 09/18/2009   Qualifier: Diagnosis of  By: Kem Parkinson     Past Surgical History:  Procedure Laterality Date  . CARDIAC CATHETERIZATION N/A 05/17/2015   Procedure: Left Heart Cath and Coronary Angiography;  Surgeon: Wendall Stade, MD;  Location: [facility] INVASIVE CV LAB;  Service: Cardiovascular;  Laterality: N/A;  . CARDIAC CATHETERIZATION  2012  . CORONARY ANGIOPLASTY WITH STENT PLACEMENT  2011  . CORONARY STENT INTERVENTION N/A 11/14/2017   Procedure: CORONARY STENT INTERVENTION;  Surgeon: Kathleene Hazel, MD;  Location: [facility] INVASIVE CV LAB;  Service: Cardiovascular;  Laterality: N/A;  . ICD IMPLANT  11/2009   "w/pacing function"  . RIGHT/LEFT HEART CATH AND CORONARY ANGIOGRAPHY N/A 11/14/2017   Procedure: RIGHT/LEFT HEART CATH AND CORONARY ANGIOGRAPHY;  Surgeon: Kathleene Hazel, MD;  Location: [facility] INVASIVE CV LAB;  Service: Cardiovascular;  Laterality: N/A;   Social History   Socioeconomic History  . Marital status: Married    Spouse name: Not on file  . Number of children: 0  . Years of education: Not on file  . Highest education level: Not on file  Occupational History  . 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 Financial resource strain: Not on file  . Food insecurity:    Worry: Not on file  Inability: Not on file  . Transportation needs:    Medical: Not on file    Non-medical: Not on file  Tobacco Use  . Smoking status: Former Smoker    Packs/day: 0.10    Years: 2.00    Pack years: 0.20    Types: Cigarettes    Last attempt to quit: 02/18/1994    Years since quitting: 23.9  . Smokeless tobacco: Never Used  Substance and Sexual Activity  . Alcohol use: Yes    Comment: 11/25/2017 "couple beers/month; if that"  . Drug use: Never  . Sexual activity: Yes  Lifestyle  . Physical activity:    Days per week: Not on file     Minutes per session: Not on file  . Stress: Not on file  Relationships  . Social connections:    Talks on phone: Not on file    Gets together: Not on file    Attends religious service: Not on file    Active member of club or organization: Not on file    Attends meetings of clubs or organizations: Not on file    Relationship status: Not on file  Other Topics Concern  . Not on file  Social History Narrative  . Not on file   Allergies  Allergen Reactions  . Levaquin [Levofloxacin] Other (See Comments)    Joint pain  . Ace Inhibitors Cough  . Crestor [Rosuvastatin] Other (See Comments)    "Sweet cravings"  . Lunesta [Eszopiclone] Other (See Comments)    "Bitter taste in mouth"  . Zocor [Simvastatin] Other (See Comments)    Memory problem   Family History  Problem Relation Age of Onset  . Heart attack Father   . Heart disease Unknown   . Diabetes Unknown   . Hypothyroidism Unknown      Current Outpatient Medications (Cardiovascular):  .  atorvastatin (LIPITOR) 80 MG tablet, Take 1 tablet (80 mg total) by mouth daily. .  carvedilol (COREG) 6.25 MG tablet, TAKE 1 TABLET BY MOUTH TWICE A DAY .  ENTRESTO 24-26 MG, TAKE 1 TABLET BY MOUTH TWICE A DAY (Patient taking differently: Take 1 tablet by mouth 2 (two) times daily. ) .  metolazone (ZAROXOLYN) 5 MG tablet, TAKE 1 TABLET (5 MG TOTAL) BY MOUTH DAILY AS NEEDED (FOR EDEMA). .  mexiletine (MEXITIL) 200 MG capsule, Take 1 capsule (200 mg total) by mouth 2 (two) times daily. Please make appt with Dr. Graciela Husbands for January before anymore refills. 1st attempt .  spironolactone (ALDACTONE) 25 MG tablet, Take 0.5 tablets (12.5 mg total) by mouth daily. (Patient taking differently: Take 12.5 mg by mouth every evening. ) .  torsemide (DEMADEX) 20 MG tablet, Take 2 tablets (40 mg total) by mouth 2 (two) times daily. .  nitroGLYCERIN (NITROSTAT) 0.4 MG SL tablet, Place 1 tablet (0.4 mg total) under the tongue every 5 (five) minutes as needed for  chest pain.  Current Outpatient Medications (Respiratory):  .  albuterol (PROAIR HFA) 108 (90 Base) MCG/ACT inhaler, Inhale 1-2 puffs into the lungs every 6 (six) hours as needed for wheezing or shortness of breath. .  budesonide-formoterol (SYMBICORT) 80-4.5 MCG/ACT inhaler, Inhale 2 puffs into the lungs 2 (two) times daily as needed (wheezing). (Patient taking differently: Inhale 2 puffs into the lungs 2 (two) times daily as needed (for wheezing or flares). ) .  fluticasone (FLONASE) 50 MCG/ACT nasal spray, PLACE 2 SPRAYS INTO BOTH NOSTRILS DAILY AS NEEDED (SEASONAL ALLERGIES).  Current Outpatient Medications (Analgesics):  .  aspirin EC 81 MG tablet, Take 81 mg by mouth daily. .  traMADol (ULTRAM) 50 MG tablet, Take 1 tablet (50 mg total) by mouth every 12 (twelve) hours as needed.  Current Outpatient Medications (Hematological):  .  prasugrel (EFFIENT) 10 MG TABS tablet, Take 1 tablet (10 mg total) by mouth daily.  Current Outpatient Medications (Other):  [name]  ALPRAZolam (XANAX) 0.5 MG tablet, Take 1 tablet (0.5 mg total) by mouth daily as needed for anxiety. [name]  anastrozole (ARIMIDEX) 1 MG tablet, Take 1 mg by mouth daily. .  clindamycin (CLEOCIN) 300 MG capsule, Take 1 capsule (300 mg total) by mouth every 8 (eight) hours. For 7 days. .  Coenzyme Q10 (CO Q 10) 100 MG CAPS, Take 100 mg by mouth daily.  [name]  KLOR-CON M20 20 MEQ tablet, TAKE 2 TABLETS BY MOUTH EVERY DAY (Patient taking differently: Take 40 mEq by mouth daily. ) .  tiZANidine (ZANAFLEX) 4 MG tablet, TAKE 1 TABLET BY MOUTH EVERY 6 HOURS AS NEEDED SPASMS OF THE MUSCLE (Patient taking differently: Take 4 mg by mouth every evening. ) .  Vitamin D, Ergocalciferol, (DRISDOL) 1.25 MG (50000 UT) CAPS capsule, Take 1 capsule (50,000 Units total) by mouth every 7 (seven) days.    Past medical history, social, surgical and family history all reviewed in electronic medical record.  No pertanent information unless stated regarding to the  chief complaint.   Review of Systems:  No headache, visual changes, nausea, vomiting, diarrhea, constipation, dizziness, abdominal pain, skin rash, fevers, chills, night sweats, weight loss, swollen lymph nodes, body aches, joint swelling, muscle aches, chest pain, shortness of breath, mood changes.   Objective  Blood pressure 102/76, pulse 83, height 6\' 2"  (1.88 m), weight 219 lb (99.3 kg), SpO2 97 %.    General: No apparent distress alert and oriented x3 mood and affect normal, dressed appropriately.  HEENT: Pupils equal, extraocular movements intact  Respiratory: Patient's speak in full sentences and does not appear short of breath  Cardiovascular: No lower extremity edema, non tender, no erythema  Skin: Warm dry intact with no signs of infection or rash on extremities or on axial skeleton.  Abdomen: Soft nontender  Neuro: Cranial nerves II through XII are intact, neurovascularly intact in all extremities with 2+ DTRs and 2+ pulses.  Lymph: No lymphadenopathy of posterior or anterior cervical chain or axillae bilaterally.  Gait normal with good balance and coordination.  MSK:  Non tender with full range of motion and good stability and symmetric strength and tone of shoulders, elbows, , hip, knee and ankles bilaterally.  Left hand exam still has some mild swelling noted.  Tender to palpation over the scaphoid bone noted in the anatomical snuffbox.  Neurovascular intact distally.  Good grip strength.  Patient still has some bruising on the palmar aspect of the hand.  Limited musculoskeletal ultrasound was performed and interpreted by Judi Saa  Limited ultrasound shows the patient does have some callus formation over the scaphoid bone noted.  Increasing Doppler flow but continued hypoechoic changes as well.  Patient to question fracture slow healing comparatively.  Mild soft ossification noted Impression: Healing fractures noted.      Impression and Recommendations:      The  above documentation has been reviewed and is accurate and complete Judi Saa, DO       Note: This dictation was prepared with Dragon dictation along with smaller phrase technology. Any transcriptional errors that result from this process are unintentional.

## 2018-01-14 NOTE — Patient Instructions (Signed)
Good to see you  Heat may be better to the hands  3-4 more weeks in the brace See me again in 3-4 weeks (ok to double book)

## 2018-01-17 ENCOUNTER — Encounter: Payer: Self-pay | Admitting: Family Medicine

## 2018-01-17 NOTE — Assessment & Plan Note (Signed)
Scaphoid as well as triquetrum fractions to show healing but it does seem to be within normal limits.  Continue current bracing at this moment.  Discussed icing regimen and home exercises.  Discussed which activities to do which was to avoid.  Patient is to increase activity slowly over the course the next several days.  Follow-up again in 4 weeks

## 2018-01-22 ENCOUNTER — Other Ambulatory Visit: Payer: Self-pay

## 2018-01-29 ENCOUNTER — Other Ambulatory Visit: Payer: Self-pay | Admitting: Internal Medicine

## 2018-01-29 ENCOUNTER — Other Ambulatory Visit: Payer: Self-pay | Admitting: *Deleted

## 2018-01-29 DIAGNOSIS — Z79899 Other long term (current) drug therapy: Secondary | ICD-10-CM

## 2018-01-29 DIAGNOSIS — I5022 Chronic systolic (congestive) heart failure: Secondary | ICD-10-CM

## 2018-01-30 ENCOUNTER — Telehealth: Payer: Self-pay | Admitting: *Deleted

## 2018-01-30 LAB — BASIC METABOLIC PANEL
BUN / CREAT RATIO: 8 — AB (ref 9–20)
BUN: 10 mg/dL (ref 6–24)
CO2: 26 mmol/L (ref 20–29)
CREATININE: 1.19 mg/dL (ref 0.76–1.27)
Calcium: 9.9 mg/dL (ref 8.7–10.2)
Chloride: 99 mmol/L (ref 96–106)
GFR calc Af Amer: 87 mL/min/{1.73_m2} (ref >59)
GFR, EST NON AFRICAN AMERICAN: 75 mL/min/{1.73_m2} (ref >59)
Glucose: 81 mg/dL (ref 65–99)
Potassium: 4.2 mmol/L (ref 3.5–5.2)
SODIUM: 143 mmol/L (ref 134–144)

## 2018-01-30 NOTE — Telephone Encounter (Signed)
-----   Message from Leone Brand, NP sent at 01/30/2018  8:21 AM EST ----- Results back to normal.

## 2018-01-30 NOTE — Telephone Encounter (Signed)
Called pt re: lab results.  Voicemail full and couldn't leave a message.

## 2018-02-02 NOTE — Telephone Encounter (Signed)
Pt has been made ware of his normal lab results, he verbalized understanding.

## 2018-02-02 NOTE — Progress Notes (Signed)
[name] [address] [place], [place] [address] Phone: [phone] Subjective:   Bruce Donath, am serving as a scribe for Dr. Antoine Primas.    CC: Left and wrist pain follow-up  [id number]  Jason Hogan is a 42 y.o. male coming in with complaint of left thumb pain. Does have pain with lifting his child. Is finding that he isn't wearing brace as much. Does present to office today in brace.  Found to have a scaphoid fracture.  Also triquetrium fracture.  Patient states making improvement.  Not been wearing the brace quite as often.  Feels okay.  Tried to lift 1 thing without the brace on and still had some difficulty.      Past Medical History:  Diagnosis Date  . Acute Myocardial Infarction 08/18/2009   Qualifier: Diagnosis of  By: Kem Parkinson    . Allergic rhinitis, cause unspecified 10/03/2011  . Anxiety 10/03/2011  . Asthma 09/20/2006   Qualifier: Diagnosis of  By: Jonny Ruiz MD, Len Blalock   . 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 Qualifier: Diagnosis of  By: Eden Emms, MD, Harrington Challenger   . CAD (coronary artery disease)   . Cholelithiasis 06/22/2007   Qualifier: Diagnosis of  By: Maris Berger   . Chronic systolic heart failure (HCC) 09/18/2009   Qualifier: Diagnosis of  By: Kem Parkinson    . Depressive disorder, not elsewhere classified 06/22/2007   Qualifier: Diagnosis of  By: Maris Berger   . History of gout   . History of kidney stones   . Hyperlipidemia 81/2011  . Hypertension   . Impaired glucose tolerance 10/05/2012  . Insomnia, unspecified 06/22/2007   Qualifier: Diagnosis of  By: Jonny Ruiz MD, Len Blalock   . Ischemic cardiomyopathy 10/20/2009   Qualifier: Diagnosis of  By: Graciela Husbands, MD, [facility], Ty Hilts   . 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 Migraine Headache 09/18/2009   Qualifier: Diagnosis of  By: Kem Parkinson    . Renal calculus   .  Renal Insufficiency 09/18/2009   Qualifier: Diagnosis of  By: Kem Parkinson    . Respiratory failure (HCC) 08/2009  . Rhabdomyolysis 09/18/2009   Qualifier: Diagnosis of  By: Kem Parkinson     Past Surgical History:  Procedure Laterality Date  . CARDIAC CATHETERIZATION N/A 05/17/2015   Procedure: Left Heart Cath and Coronary Angiography;  Surgeon: Wendall Stade, MD;  Location: [facility] INVASIVE CV LAB;  Service: Cardiovascular;  Laterality: N/A;  . CARDIAC CATHETERIZATION  2012  . CORONARY ANGIOPLASTY WITH STENT PLACEMENT  2011  . CORONARY STENT INTERVENTION N/A 11/14/2017   Procedure: CORONARY STENT INTERVENTION;  Surgeon: Kathleene Hazel, MD;  Location: [facility] INVASIVE CV LAB;  Service: Cardiovascular;  Laterality: N/A;  . ICD IMPLANT  11/2009   "w/pacing function"  . RIGHT/LEFT HEART CATH AND CORONARY ANGIOGRAPHY N/A 11/14/2017   Procedure: RIGHT/LEFT HEART CATH AND CORONARY ANGIOGRAPHY;  Surgeon: Kathleene Hazel, MD;  Location: [facility] INVASIVE CV LAB;  Service: Cardiovascular;  Laterality: N/A;   Social History   Socioeconomic History  . Marital status: Married    Spouse name: Not on file  . Number of children: 0  . Years of education: Not on file  . Highest education level: Not on file  Occupational History  . 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 Financial resource strain: Not on file  . Food insecurity:  Worry: Not on file    Inability: Not on file  . Transportation needs:    Medical: Not on file    Non-medical: Not on file  Tobacco Use  . Smoking status: Former Smoker    Packs/day: 0.10    Years: 2.00    Pack years: 0.20    Types: Cigarettes    Last attempt to quit: 02/18/1994    Years since quitting: 23.9  . Smokeless tobacco: Never Used  Substance and Sexual Activity  . Alcohol use: Yes    Comment: 11/25/2017 "couple beers/month; if that"  . Drug use: Never  . Sexual activity: Yes  Lifestyle  . Physical activity:    Days  per week: Not on file    Minutes per session: Not on file  . Stress: Not on file  Relationships  . Social connections:    Talks on phone: Not on file    Gets together: Not on file    Attends religious service: Not on file    Active member of club or organization: Not on file    Attends meetings of clubs or organizations: Not on file    Relationship status: Not on file  Other Topics Concern  . Not on file  Social History Narrative  . Not on file   Allergies  Allergen Reactions  . Levaquin [Levofloxacin] Other (See Comments)    Joint pain  . Ace Inhibitors Cough  . Crestor [Rosuvastatin] Other (See Comments)    "Sweet cravings"  . Lunesta [Eszopiclone] Other (See Comments)    "Bitter taste in mouth"  . Zocor [Simvastatin] Other (See Comments)    Memory problem   Family History  Problem Relation Age of Onset  . Heart attack Father   . Heart disease Unknown   . Diabetes Unknown   . Hypothyroidism Unknown      Current Outpatient Medications (Cardiovascular):  .  atorvastatin (LIPITOR) 80 MG tablet, Take 1 tablet (80 mg total) by mouth daily. .  carvedilol (COREG) 6.25 MG tablet, TAKE 1 TABLET BY MOUTH TWICE A DAY .  ENTRESTO 24-26 MG, TAKE 1 TABLET BY MOUTH TWICE A DAY (Patient taking differently: Take 1 tablet by mouth 2 (two) times daily. ) .  metolazone (ZAROXOLYN) 5 MG tablet, TAKE 1 TABLET (5 MG TOTAL) BY MOUTH DAILY AS NEEDED (FOR EDEMA). .  mexiletine (MEXITIL) 200 MG capsule, Take 1 capsule (200 mg total) by mouth 2 (two) times daily. Please make appt with Dr. Graciela Husbands for January before anymore refills. 1st attempt .  spironolactone (ALDACTONE) 25 MG tablet, Take 0.5 tablets (12.5 mg total) by mouth daily. (Patient taking differently: Take 12.5 mg by mouth every evening. ) .  torsemide (DEMADEX) 20 MG tablet, Take 2 tablets (40 mg total) by mouth 2 (two) times daily. .  nitroGLYCERIN (NITROSTAT) 0.4 MG SL tablet, Place 1 tablet (0.4 mg total) under the tongue every 5  (five) minutes as needed for chest pain.  Current Outpatient Medications (Respiratory):  .  albuterol (PROAIR HFA) 108 (90 Base) MCG/ACT inhaler, Inhale 1-2 puffs into the lungs every 6 (six) hours as needed for wheezing or shortness of breath. .  budesonide-formoterol (SYMBICORT) 80-4.5 MCG/ACT inhaler, Inhale 2 puffs into the lungs 2 (two) times daily as needed (wheezing). (Patient taking differently: Inhale 2 puffs into the lungs 2 (two) times daily as needed (for wheezing or flares). ) .  fluticasone (FLONASE) 50 MCG/ACT nasal spray, PLACE 2 SPRAYS INTO BOTH NOSTRILS DAILY AS NEEDED (SEASONAL ALLERGIES).  Current Outpatient Medications (Analgesics):  .  aspirin EC 81 MG tablet, Take 81 mg by mouth daily. .  traMADol (ULTRAM) 50 MG tablet, Take 1 tablet (50 mg total) by mouth every 12 (twelve) hours as needed.  Current Outpatient Medications (Hematological):  .  prasugrel (EFFIENT) 10 MG TABS tablet, Take 1 tablet (10 mg total) by mouth daily.  Current Outpatient Medications (Other):  [name]  ALPRAZolam (XANAX) 0.5 MG tablet, Take 1 tablet (0.5 mg total) by mouth daily as needed for anxiety. [name]  anastrozole (ARIMIDEX) 1 MG tablet, Take 1 mg by mouth daily. .  clindamycin (CLEOCIN) 300 MG capsule, Take 1 capsule (300 mg total) by mouth every 8 (eight) hours. For 7 days. .  Coenzyme Q10 (CO Q 10) 100 MG CAPS, Take 100 mg by mouth daily.  [name]  KLOR-CON M20 20 MEQ tablet, TAKE 2 TABLETS BY MOUTH EVERY DAY (Patient taking differently: Take 40 mEq by mouth daily. ) .  tiZANidine (ZANAFLEX) 4 MG tablet, TAKE 1 TABLET BY MOUTH EVERY 6 HOURS AS NEEDED SPASMS OF THE MUSCLE .  Vitamin D, Ergocalciferol, (DRISDOL) 1.25 MG (50000 UT) CAPS capsule, Take 1 capsule (50,000 Units total) by mouth every 7 (seven) days.    Past medical history, social, surgical and family history all reviewed in electronic medical record.  No pertanent information unless stated regarding to the chief complaint.   Review of  Systems:  No headache, visual changes, nausea, vomiting, diarrhea, constipation, dizziness, abdominal pain, skin rash, fevers, chills, night sweats, weight loss, swollen lymph nodes, body aches, joint swelling, muscle aches, chest pain, shortness of breath, mood changes.   Objective  Blood pressure 128/90, pulse 88, height 6\' 2"  (1.88 m), weight 219 lb (99.3 kg), SpO2 95 %.   General: No apparent distress alert and oriented x3 mood and affect normal, dressed appropriately.  HEENT: Pupils equal, extraocular movements intact  Respiratory: Patient's speak in full sentences and does not appear short of breath  Cardiovascular: No lower extremity edema, non tender, no erythema  Skin: Warm dry intact with no signs of infection or rash on extremities or on axial skeleton.  Abdomen: Soft nontender  Neuro: Cranial nerves II through XII are intact, neurovascularly intact in all extremities with 2+ DTRs and 2+ pulses.  Lymph: No lymphadenopathy of posterior or anterior cervical chain or axillae bilaterally.  Gait normal with good balance and coordination.  MSK:  Non tender with full range of motion and good stability and symmetric strength and tone of shoulders, elbows,  hip, knee and ankles bilaterally.  Left wrist exam shows the patient is still somewhat tender over the scaphoid bone.  Still tender over the triquetrum as well.  Near full range of motion with some mild tightness.  Grip strength 4+ out of 5 compared to contralateral side  Limited musculoskeletal ultrasound was performed and interpreted by Judi Saa  Limited ultrasound shows that patient does have callus formation over the scaphoid bone.  Still not full though.  There is still a part that seems to have more of a soft callus.  Impression: Interval healing of scaphoid fracture Impression and Recommendations:      The above documentation has been reviewed and is accurate and complete Judi Saa, DO       Note: This dictation  was prepared with Dragon dictation along with smaller phrase technology. Any transcriptional errors that result from this process are unintentional.

## 2018-02-03 ENCOUNTER — Encounter: Payer: Self-pay | Admitting: Family Medicine

## 2018-02-03 ENCOUNTER — Ambulatory Visit (INDEPENDENT_AMBULATORY_CARE_PROVIDER_SITE_OTHER): Payer: Self-pay | Admitting: Family Medicine

## 2018-02-03 ENCOUNTER — Ambulatory Visit: Payer: Self-pay

## 2018-02-03 VITALS — BP 128/90 | HR 88 | Ht 74.0 in | Wt 219.0 lb

## 2018-02-03 DIAGNOSIS — M79645 Pain in left finger(s): Principal | ICD-10-CM

## 2018-02-03 DIAGNOSIS — S62102A Fracture of unspecified carpal bone, left wrist, initial encounter for closed fracture: Secondary | ICD-10-CM

## 2018-02-03 DIAGNOSIS — G8929 Other chronic pain: Secondary | ICD-10-CM

## 2018-02-03 NOTE — Patient Instructions (Signed)
Great to see you  2 more weeks of this boring stuff Ok to come out of the brace at home as long as not lifting.  OK to move it  In 2 weeks then start these exercises 3 times a week  No heavy lifting until I see you again in 4-6 weeks

## 2018-02-03 NOTE — Assessment & Plan Note (Signed)
Patient encouraged just on the bracing for another 2 weeks.  Start increasing activity slowly.  Home exercises given to start with range of motion and some mild strengthening.  Follow-up again for 1 more follow-up with ultrasound in 4 to 6 weeks for further evaluation and treatment.

## 2018-02-05 ENCOUNTER — Telehealth: Payer: Self-pay

## 2018-02-05 NOTE — Telephone Encounter (Signed)
Attempted to confirm remote transmission with pt. No answer and was unable to leave a message.   

## 2018-02-06 ENCOUNTER — Encounter: Payer: Self-pay | Admitting: Cardiology

## 2018-02-09 ENCOUNTER — Ambulatory Visit (INDEPENDENT_AMBULATORY_CARE_PROVIDER_SITE_OTHER): Payer: Self-pay

## 2018-02-09 DIAGNOSIS — I255 Ischemic cardiomyopathy: Secondary | ICD-10-CM

## 2018-02-09 DIAGNOSIS — I5022 Chronic systolic (congestive) heart failure: Secondary | ICD-10-CM

## 2018-02-10 ENCOUNTER — Other Ambulatory Visit: Payer: Self-pay | Admitting: Cardiovascular Disease

## 2018-02-12 NOTE — Progress Notes (Signed)
Remote ICD transmission.   

## 2018-02-13 LAB — CUP PACEART REMOTE DEVICE CHECK
Battery Remaining Percentage: 32 %
Date Time Interrogation Session: 20191221082130
HighPow Impedance: 47 Ohm
Implantable Lead Implant Date: 20111010
Implantable Lead Location: 753860
Implantable Pulse Generator Implant Date: 20111010
Lead Channel Pacing Threshold Amplitude: 1.75 V
Lead Channel Pacing Threshold Pulse Width: 1 ms
Lead Channel Sensing Intrinsic Amplitude: 11.9 mV
MDC IDC MSMT BATTERY REMAINING LONGEVITY: 30 mo
MDC IDC MSMT BATTERY VOLTAGE: 2.86 V
MDC IDC MSMT LEADCHNL RV IMPEDANCE VALUE: 480 Ohm
MDC IDC SET LEADCHNL RV PACING AMPLITUDE: 4 V
MDC IDC SET LEADCHNL RV PACING PULSEWIDTH: 1 ms
MDC IDC SET LEADCHNL RV SENSING SENSITIVITY: 0.5 mV
MDC IDC STAT BRADY RV PERCENT PACED: 1 %
Pulse Gen Serial Number: 615318

## 2018-02-16 ENCOUNTER — Encounter: Payer: Self-pay | Admitting: *Deleted

## 2018-03-09 ENCOUNTER — Encounter: Payer: Self-pay | Admitting: Family Medicine

## 2018-03-09 ENCOUNTER — Ambulatory Visit: Payer: Self-pay

## 2018-03-09 ENCOUNTER — Ambulatory Visit: Payer: Self-pay | Admitting: Family Medicine

## 2018-03-09 ENCOUNTER — Ambulatory Visit (INDEPENDENT_AMBULATORY_CARE_PROVIDER_SITE_OTHER): Payer: Self-pay | Admitting: Family Medicine

## 2018-03-09 VITALS — BP 110/74 | HR 90 | Ht 74.0 in | Wt 216.0 lb

## 2018-03-09 DIAGNOSIS — M79642 Pain in left hand: Secondary | ICD-10-CM

## 2018-03-09 DIAGNOSIS — S62102A Fracture of unspecified carpal bone, left wrist, initial encounter for closed fracture: Secondary | ICD-10-CM

## 2018-03-09 NOTE — Assessment & Plan Note (Signed)
Hold at moment but likely going to have a secondary injury to the TFCC.  Patient will increase activity slowly.  We discussed the possibility of PRP injections if needed.  Patient will brace occasionally.  Increase activity slowly.  Follow-up again 6 to 8 weeks

## 2018-03-09 NOTE — Progress Notes (Deleted)
[name] [address] [place], [place] [address] Phone: [phone] Subjective:    I'm seeing this patient by the request  of:    CC: Wrist pain follow-up  [id number]  Jason Hogan is a 43 y.o. male coming in with complaint of ***  Onset-  Location Duration-  Character- Aggravating factors- Reliving factors-  Therapies tried-  Severity-     Past Medical History:  Diagnosis Date  . Acute Myocardial Infarction 08/18/2009   Qualifier: Diagnosis of  By: Kem Parkinson    . Allergic rhinitis, cause unspecified 10/03/2011  . Anxiety 10/03/2011  . Asthma 09/20/2006   Qualifier: Diagnosis of  By: Jonny Ruiz MD, Len Blalock   . 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 Qualifier: Diagnosis of  By: Eden Emms, MD, Harrington Challenger   . CAD (coronary artery disease)   . Cholelithiasis 06/22/2007   Qualifier: Diagnosis of  By: Maris Berger   . Chronic systolic heart failure (HCC) 09/18/2009   Qualifier: Diagnosis of  By: Kem Parkinson    . Depressive disorder, not elsewhere classified 06/22/2007   Qualifier: Diagnosis of  By: Maris Berger   . History of gout   . History of kidney stones   . Hyperlipidemia 81/2011  . Hypertension   . Impaired glucose tolerance 10/05/2012  . Insomnia, unspecified 06/22/2007   Qualifier: Diagnosis of  By: Jonny Ruiz MD, Len Blalock   . Ischemic cardiomyopathy 10/20/2009   Qualifier: Diagnosis of  By: Graciela Husbands, MD, [facility], Ty Hilts   . 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 Migraine Headache 09/18/2009   Qualifier: Diagnosis of  By: Kem Parkinson    . Renal calculus   . Renal Insufficiency 09/18/2009   Qualifier: Diagnosis of  By: Kem Parkinson    . Respiratory failure (HCC) 08/2009  . Rhabdomyolysis 09/18/2009   Qualifier: Diagnosis of  By: Kem Parkinson     Past Surgical History:  Procedure Laterality Date  . CARDIAC CATHETERIZATION  N/A 05/17/2015   Procedure: Left Heart Cath and Coronary Angiography;  Surgeon: Wendall Stade, MD;  Location: [facility] INVASIVE CV LAB;  Service: Cardiovascular;  Laterality: N/A;  . CARDIAC CATHETERIZATION  2012  . CORONARY ANGIOPLASTY WITH STENT PLACEMENT  2011  . CORONARY STENT INTERVENTION N/A 11/14/2017   Procedure: CORONARY STENT INTERVENTION;  Surgeon: Kathleene Hazel, MD;  Location: [facility] INVASIVE CV LAB;  Service: Cardiovascular;  Laterality: N/A;  . ICD IMPLANT  11/2009   "w/pacing function"  . RIGHT/LEFT HEART CATH AND CORONARY ANGIOGRAPHY N/A 11/14/2017   Procedure: RIGHT/LEFT HEART CATH AND CORONARY ANGIOGRAPHY;  Surgeon: Kathleene Hazel, MD;  Location: [facility] INVASIVE CV LAB;  Service: Cardiovascular;  Laterality: N/A;   Social History   Socioeconomic History  . Marital status: Married    Spouse name: Not on file  . Number of children: 0  . Years of education: Not on file  . Highest education level: Not on file  Occupational History  . 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 Financial resource strain: Not on file  . Food insecurity:    Worry: Not on file    Inability: Not on file  . Transportation needs:    Medical: Not on file    Non-medical: Not on file  Tobacco Use  . Smoking status: Former Smoker    Packs/day: 0.10    Years: 2.00    Pack years: 0.20  Types: Cigarettes    Last attempt to quit: 02/18/1994    Years since quitting: 24.0  . Smokeless tobacco: Never Used  Substance and Sexual Activity  . Alcohol use: Yes    Comment: 11/25/2017 "couple beers/month; if that"  . Drug use: Never  . Sexual activity: Yes  Lifestyle  . Physical activity:    Days per week: Not on file    Minutes per session: Not on file  . Stress: Not on file  Relationships  . Social connections:    Talks on phone: Not on file    Gets together: Not on file    Attends religious service: Not on file    Active member of club or organization: Not on file      Attends meetings of clubs or organizations: Not on file    Relationship status: Not on file  Other Topics Concern  . Not on file  Social History Narrative  . Not on file   Allergies  Allergen Reactions  . Levaquin [Levofloxacin] Other (See Comments)    Joint pain  . Ace Inhibitors Cough  . Crestor [Rosuvastatin] Other (See Comments)    "Sweet cravings"  . Lunesta [Eszopiclone] Other (See Comments)    "Bitter taste in mouth"  . Zocor [Simvastatin] Other (See Comments)    Memory problem   Family History  Problem Relation Age of Onset  . Heart attack Father   . Heart disease Other   . Diabetes Other   . Hypothyroidism Other      Current Outpatient Medications (Cardiovascular):  .  atorvastatin (LIPITOR) 80 MG tablet, Take 1 tablet (80 mg total) by mouth daily. .  carvedilol (COREG) 6.25 MG tablet, TAKE 1 TABLET BY MOUTH TWICE A DAY .  ENTRESTO 24-26 MG, TAKE 1 TABLET BY MOUTH TWICE A DAY (Patient taking differently: Take 1 tablet by mouth 2 (two) times daily. ) .  metolazone (ZAROXOLYN) 5 MG tablet, TAKE 1 TABLET (5 MG TOTAL) BY MOUTH DAILY AS NEEDED (FOR EDEMA). .  mexiletine (MEXITIL) 200 MG capsule, TAKE 1 CAPSULE BY MOUTH TWICE A DAY .  nitroGLYCERIN (NITROSTAT) 0.4 MG SL tablet, Place 1 tablet (0.4 mg total) under the tongue every 5 (five) minutes as needed for chest pain. [name].  spironolactone (ALDACTONE) 25 MG tablet, Take 0.5 tablets (12.5 mg total) by mouth daily. (Patient taking differently: Take 12.5 mg by mouth every evening. ) .  torsemide (DEMADEX) 20 MG tablet, Take 2 tablets (40 mg total) by mouth 2 (two) times daily.  Current Outpatient Medications (Respiratory):  .  albuterol (PROAIR HFA) 108 (90 Base) MCG/ACT inhaler, Inhale 1-2 puffs into the lungs every 6 (six) hours as needed for wheezing or shortness of breath. .  budesonide-formoterol (SYMBICORT) 80-4.5 MCG/ACT inhaler, Inhale 2 puffs into the lungs 2 (two) times daily as needed (wheezing). (Patient taking  differently: Inhale 2 puffs into the lungs 2 (two) times daily as needed (for wheezing or flares). ) .  fluticasone (FLONASE) 50 MCG/ACT nasal spray, PLACE 2 SPRAYS INTO BOTH NOSTRILS DAILY AS NEEDED (SEASONAL ALLERGIES).  Current Outpatient Medications (Analgesics):  .  aspirin EC 81 MG tablet, Take 81 mg by mouth daily. .  traMADol (ULTRAM) 50 MG tablet, Take 1 tablet (50 mg total) by mouth every 12 (twelve) hours as needed.  Current Outpatient Medications (Hematological):  .  prasugrel (EFFIENT) 10 MG TABS tablet, Take 1 tablet (10 mg total) by mouth daily.  Current Outpatient Medications (Other):  [name].  ALPRAZolam (XANAX) 0.5  MG tablet, Take 1 tablet (0.5 mg total) by mouth daily as needed for anxiety. [name]  anastrozole (ARIMIDEX) 1 MG tablet, Take 1 mg by mouth daily. .  clindamycin (CLEOCIN) 300 MG capsule, Take 1 capsule (300 mg total) by mouth every 8 (eight) hours. For 7 days. .  Coenzyme Q10 (CO Q 10) 100 MG CAPS, Take 100 mg by mouth daily.  [name]  KLOR-CON M20 20 MEQ tablet, TAKE 2 TABLETS BY MOUTH EVERY DAY (Patient taking differently: Take 40 mEq by mouth daily. ) .  tiZANidine (ZANAFLEX) 4 MG tablet, TAKE 1 TABLET BY MOUTH EVERY 6 HOURS AS NEEDED SPASMS OF THE MUSCLE .  Vitamin D, Ergocalciferol, (DRISDOL) 1.25 MG (50000 UT) CAPS capsule, Take 1 capsule (50,000 Units total) by mouth every 7 (seven) days.    Past medical history, social, surgical and family history all reviewed in electronic medical record.  No pertanent information unless stated regarding to the chief complaint.   Review of Systems:  No headache, visual changes, nausea, vomiting, diarrhea, constipation, dizziness, abdominal pain, skin rash, fevers, chills, night sweats, weight loss, swollen lymph nodes, body aches, joint swelling, muscle aches, chest pain, shortness of breath, mood changes.   Objective  There were no vitals taken for this visit. Systems examined below as of    General: No apparent distress alert  and oriented x3 mood and affect normal, dressed appropriately.  HEENT: Pupils equal, extraocular movements intact  Respiratory: Patient's speak in full sentences and does not appear short of breath  Cardiovascular: No lower extremity edema, non tender, no erythema  Skin: Warm dry intact with no signs of infection or rash on extremities or on axial skeleton.  Abdomen: Soft nontender  Neuro: Cranial nerves II through XII are intact, neurovascularly intact in all extremities with 2+ DTRs and 2+ pulses.  Lymph: No lymphadenopathy of posterior or anterior cervical chain or axillae bilaterally.  Gait normal with good balance and coordination.  MSK:  Non tender with full range of motion and good stability and symmetric strength and tone of shoulders, elbows, , hip, knee and ankles bilaterally.  Wrist: Inspection normal with no visible erythema or swelling. ROM smooth and normal with good flexion and extension and ulnar/radial deviation that is symmetrical with opposite wrist. Palpation is normal over metacarpals, navicular, lunate, and TFCC; tendons without tenderness/ swelling No snuffbox tenderness. No tenderness over Canal of [place]. Strength 5/5 in all directions without pain. Negative Finkelstein, tinel's and phalens. Negative Watson's test.   Contralateral wrist unremarkable   Impression and Recommendations:     This case required medical decision making of moderate complexity. The above documentation has been reviewed and is accurate and complete Judi Saa, DO       Note: This dictation was prepared with Dragon dictation along with smaller phrase technology. Any transcriptional errors that result from this process are unintentional.

## 2018-03-09 NOTE — Patient Instructions (Signed)
Good to see you  You are doing great  Keep it up  Lets get you in the gym with gloves.  Listen to your body  Ice after Send me a message in 3 weeks to tell me how you are doing!

## 2018-03-09 NOTE — Progress Notes (Signed)
Jason ScaleZach Rejeana [name].O. [facility] [address] [place], [place] [address] Phone: [phone] Subjective:     CC: Left hand pain follow-up  FIE:[id number]:Subjective  Jason BlalockBinoy S Hogan is a 43 y.o. male coming in with complaint of left hand pain. Has been back to the gym but is not performing pushing motions. Does have pain with pulling and continued discomfort with radial deviation both weighted and unweighted. Has not been using the brace as much lately.  Patient was seen previously at that time with possible small fracture.  Doing much better overall.  Feels that is just more weak than anything else.       Past Medical History:  Diagnosis Date  . Acute Myocardial Infarction 08/18/2009   Qualifier: Diagnosis of  By: Jason Hogan, Kimalexis    . Allergic rhinitis, cause unspecified 10/03/2011  . Anxiety 10/03/2011  . Asthma 09/20/2006   Qualifier: Diagnosis of  By: Jonny RuizJohn Hogan, Jason Hogan   . 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 Qualifier: Diagnosis of  By: Jason EmmsNishan, Hogan, Harrington ChallengerFACC, Jason Hogan   . CAD (coronary artery disease)   . Cholelithiasis 06/22/2007   Qualifier: Diagnosis of  By: Maris BergerSherwood, Elizabeth Ann   . Chronic systolic heart failure (HCC) 09/18/2009   Qualifier: Diagnosis of  By: Jason Hogan, Kimalexis    . Depressive disorder, not elsewhere classified 06/22/2007   Qualifier: Diagnosis of  By: Maris BergerSherwood, Elizabeth Ann   . History of gout   . History of kidney stones   . Hyperlipidemia 81/2011  . Hypertension   . Impaired glucose tolerance 10/05/2012  . Insomnia, unspecified 06/22/2007   Qualifier: Diagnosis of  By: Jonny RuizJohn Hogan, Jason Hogan   . Ischemic cardiomyopathy 10/20/2009   Qualifier: Diagnosis of  By: Graciela HusbandsKlein, Hogan, [facility], Jason Hogan   . 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 Migraine Headache 09/18/2009   Qualifier: Diagnosis of  By: Jason Hogan, Kimalexis    . Renal calculus   . Renal Insufficiency 09/18/2009   Qualifier: Diagnosis of  By:  Jason Hogan, Kimalexis    . Respiratory failure (HCC) 08/2009  . Rhabdomyolysis 09/18/2009   Qualifier: Diagnosis of  By: Jason Hogan, Kimalexis     Past Surgical History:  Procedure Laterality Date  . CARDIAC CATHETERIZATION N/A 05/17/2015   Procedure: Left Heart Cath and Coronary Angiography;  Surgeon: Wendall StadePeter C Nishan, Hogan;  [facility];  Service: Cardiovascular;  Laterality: N/A;  . CARDIAC CATHETERIZATION  2012  . CORONARY ANGIOPLASTY WITH STENT PLACEMENT  2011  . CORONARY STENT INTERVENTION N/A 11/14/2017   Procedure: CORONARY STENT INTERVENTION;  Surgeon: Kathleene HazelMcAlhany, Jason Hogan, Hogan;  Location: [facility] INVASIVE CV LAB;  Service: Cardiovascular;  Laterality: N/A;  . ICD IMPLANT  11/2009   "Hogan/pacing function"  . RIGHT/LEFT HEART CATH AND CORONARY ANGIOGRAPHY N/A 11/14/2017   Procedure: RIGHT/LEFT HEART CATH AND CORONARY ANGIOGRAPHY;  Surgeon: Kathleene HazelMcAlhany, Jason Hogan, Hogan;  Location: [facility] INVASIVE CV LAB;  Service: Cardiovascular;  Laterality: N/A;   Social History   Socioeconomic History  . Marital status: Married    Spouse name: Not on file  . Number of children: 0  . Years of education: Not on file  . Highest education level: Not on file  Occupational History  . 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 Financial resource strain: Not on file  . Food insecurity:    Worry: Not on file    Inability: Not on file  . Transportation needs:  Medical: Not on file    Non-medical: Not on file  Tobacco Use  . Smoking status: Former Smoker    Packs/day: 0.10    Years: 2.00    Pack years: 0.20    Types: Cigarettes    Last attempt to quit: 02/18/1994    Years since quitting: 24.0  . Smokeless tobacco: Never Used  Substance and Sexual Activity  . Alcohol use: Yes    Comment: 11/25/2017 "couple beers/month; if that"  . Drug use: Never  . Sexual activity: Yes  Lifestyle  . Physical activity:    Days per week: Not on file    Minutes per session: Not on file  .  Stress: Not on file  Relationships  . Social connections:    Talks on phone: Not on file    Gets together: Not on file    Attends religious service: Not on file    Active member of club or organization: Not on file    Attends meetings of clubs or organizations: Not on file    Relationship status: Not on file  Other Topics Concern  . Not on file  Social History Narrative  . Not on file   Allergies  Allergen Reactions  . Levaquin [Levofloxacin] Other (See Comments)    Joint pain  . Ace Inhibitors Cough  . Crestor [Rosuvastatin] Other (See Comments)    "Sweet cravings"  . Lunesta [Eszopiclone] Other (See Comments)    "Bitter taste in mouth"  . Zocor [Simvastatin] Other (See Comments)    Memory problem   Family History  Problem Relation Age of Onset  . Heart attack Father   . Heart disease Other   . Diabetes Other   . Hypothyroidism Other      Current Outpatient Medications (Cardiovascular):  .  atorvastatin (LIPITOR) 80 MG tablet, Take 1 tablet (80 mg total) by mouth daily. .  carvedilol (COREG) 6.25 MG tablet, TAKE 1 TABLET BY MOUTH TWICE A DAY .  ENTRESTO 24-26 MG, TAKE 1 TABLET BY MOUTH TWICE A DAY (Patient taking differently: Take 1 tablet by mouth 2 (two) times daily. ) .  metolazone (ZAROXOLYN) 5 MG tablet, TAKE 1 TABLET (5 MG TOTAL) BY MOUTH DAILY AS NEEDED (FOR EDEMA). .  mexiletine (MEXITIL) 200 MG capsule, TAKE 1 CAPSULE BY MOUTH TWICE A DAY .  nitroGLYCERIN (NITROSTAT) 0.4 MG SL tablet, Place 1 tablet (0.4 mg total) under the tongue every 5 (five) minutes as needed for chest pain. [name]  spironolactone (ALDACTONE) 25 MG tablet, Take 0.5 tablets (12.5 mg total) by mouth daily. (Patient taking differently: Take 12.5 mg by mouth every evening. ) .  torsemide (DEMADEX) 20 MG tablet, Take 2 tablets (40 mg total) by mouth 2 (two) times daily.  Current Outpatient Medications (Respiratory):  .  albuterol (PROAIR HFA) 108 (90 Base) MCG/ACT inhaler, Inhale 1-2 puffs into the  lungs every 6 (six) hours as needed for wheezing or shortness of breath. .  budesonide-formoterol (SYMBICORT) 80-4.5 MCG/ACT inhaler, Inhale 2 puffs into the lungs 2 (two) times daily as needed (wheezing). (Patient taking differently: Inhale 2 puffs into the lungs 2 (two) times daily as needed (for wheezing or flares). ) .  fluticasone (FLONASE) 50 MCG/ACT nasal spray, PLACE 2 SPRAYS INTO BOTH NOSTRILS DAILY AS NEEDED (SEASONAL ALLERGIES).  Current Outpatient Medications (Analgesics):  .  aspirin EC 81 MG tablet, Take 81 mg by mouth daily. .  traMADol (ULTRAM) 50 MG tablet, Take 1 tablet (50 mg total) by mouth every 12 (  twelve) hours as needed.  Current Outpatient Medications (Hematological):  .  prasugrel (EFFIENT) 10 MG TABS tablet, Take 1 tablet (10 mg total) by mouth daily.  Current Outpatient Medications (Other):  [name]  ALPRAZolam (XANAX) 0.5 MG tablet, Take 1 tablet (0.5 mg total) by mouth daily as needed for anxiety. [name]  anastrozole (ARIMIDEX) 1 MG tablet, Take 1 mg by mouth daily. .  clindamycin (CLEOCIN) 300 MG capsule, Take 1 capsule (300 mg total) by mouth every 8 (eight) hours. For 7 days. .  Coenzyme Q10 (CO Q 10) 100 MG CAPS, Take 100 mg by mouth daily.  [name]  KLOR-CON M20 20 MEQ tablet, TAKE 2 TABLETS BY MOUTH EVERY DAY (Patient taking differently: Take 40 mEq by mouth daily. ) .  tiZANidine (ZANAFLEX) 4 MG tablet, TAKE 1 TABLET BY MOUTH EVERY 6 HOURS AS NEEDED SPASMS OF THE MUSCLE .  Vitamin Hogan, Ergocalciferol, (DRISDOL) 1.25 MG (50000 UT) CAPS capsule, Take 1 capsule (50,000 Units total) by mouth every 7 (seven) days.    Past medical history, social, surgical and family history all reviewed in electronic medical record.  No pertanent information unless stated regarding to the chief complaint.   Review of Systems:  No headache, visual changes, nausea, vomiting, diarrhea, constipation, dizziness, abdominal pain, skin rash, fevers, chills, night sweats, weight loss, swollen lymph  nodes, body aches, joint swelling, muscle aches, chest pain, shortness of breath, mood changes.   Objective  Blood pressure 110/74, pulse 90, height 6\' 2"  (1.88 m), weight 216 lb (98 kg), SpO2 97 %.    General: No apparent distress alert and oriented x3 mood and affect normal, dressed appropriately.  HEENT: Pupils equal, extraocular movements intact  Respiratory: Patient's speak in full sentences and does not appear short of breath  Cardiovascular: No lower extremity edema, non tender, no erythema  Skin: Warm dry intact with no signs of infection or rash on extremities or on axial skeleton.  Multiple bruises noted Abdomen: Soft nontender  Neuro: Cranial nerves II through XII are intact, neurovascularly intact in all extremities with 2+ DTRs and 2+ pulses.  Lymph: No lymphadenopathy of posterior or anterior cervical chain or axillae bilaterally.  Gait normal with good balance and coordination.  MSK:  Non tender with full range of motion and good stability and symmetric strength and tone of shoulders, elbows,  hip, knee and ankles bilaterally.  Left wrist exam shows patient does have some mild limited range of motion and 5 degrees in all planes.  Patient's grip strength was 5 out of 5 compared to the contralateral side.  Limited musculoskeletal ultrasound was performed to interpreted by Judi Saa  Limited ultrasound of patient's wrist shows that patient still has some swelling around the TFCC but otherwise all bony changes that were previously seen are unremarkable. Impression: TFCC chronic tear noted   Impression and Recommendations:     This case required medical decision making of moderate complexity. The above documentation has been reviewed and is accurate and complete Antoine Primas.       Note: This dictation was prepared with Dragon dictation along with smaller phrase technology. Any transcriptional errors that result from this process are unintentional.

## 2018-03-19 ENCOUNTER — Other Ambulatory Visit: Payer: Self-pay | Admitting: Family Medicine

## 2018-03-20 ENCOUNTER — Ambulatory Visit (INDEPENDENT_AMBULATORY_CARE_PROVIDER_SITE_OTHER): Payer: Self-pay | Admitting: Internal Medicine

## 2018-03-20 ENCOUNTER — Encounter: Payer: Self-pay | Admitting: Internal Medicine

## 2018-03-20 VITALS — BP 126/88 | HR 84 | Ht 74.0 in | Wt 216.6 lb

## 2018-03-20 DIAGNOSIS — Z79899 Other long term (current) drug therapy: Secondary | ICD-10-CM

## 2018-03-20 DIAGNOSIS — I5022 Chronic systolic (congestive) heart failure: Secondary | ICD-10-CM

## 2018-03-20 DIAGNOSIS — I255 Ischemic cardiomyopathy: Secondary | ICD-10-CM

## 2018-03-20 DIAGNOSIS — Z95810 Presence of automatic (implantable) cardiac defibrillator: Secondary | ICD-10-CM

## 2018-03-20 MED ORDER — SACUBITRIL-VALSARTAN 24-26 MG PO TABS: 1.0000 | ORAL_TABLET | Freq: Two times a day (BID) | ORAL | 3 refills | Status: DC

## 2018-03-20 NOTE — Patient Instructions (Addendum)
Medication Instructions:  Your physician recommends that you continue on your current medications as directed. Please refer to the Current Medication list given to you today.  Labwork: None ordered.  Testing/Procedures: None ordered.  Follow-Up: Your physician recommends that you schedule a follow-up appointment in:   As planned with Dr Eden Emms  12 months with Dr Graciela Husbands   **Please call us when you decide which route you would like to take concerning your chest pain**   Any Other Special Instructions Will Be Listed Below (If Applicable).   Nonspecific Chest Pain Chest pain can be caused by many different conditions. Some causes of chest pain can be life-threatening. These will require treatment right away. Serious causes of chest pain include:  Heart attack.  A tear in the body's main blood vessel.  Redness and swelling (inflammation) around your heart.  Blood clot in your lungs. Other causes of chest pain may not be so serious. These include:  Heartburn.  Anxiety or stress.  Damage to bones or muscles in your chest.  Lung infections. Chest pain can feel like:  Pain or discomfort in your chest.  Crushing, pressure, aching, or squeezing pain.  Burning or tingling.  Dull or sharp pain that is worse when you move, cough, or take a deep breath.  Pain or discomfort that is also felt in your back, neck, jaw, shoulder, or arm, or pain that spreads to any of these areas. It is hard to know whether your pain is caused by something that is serious or something that is not so serious. So it is important to see your doctor right away if you have chest pain. Follow these instructions at home: Medicines  Take over-the-counter and prescription medicines only as told by your doctor.  If you were prescribed an antibiotic medicine, take it as told by your doctor. Do not stop taking the antibiotic even if you start to feel better. Lifestyle   Rest as told by your  doctor.  Do not use any products that contain nicotine or tobacco, such as cigarettes, e-cigarettes, and chewing tobacco. If you need help quitting, ask your doctor.  Do not drink alcohol.  Make lifestyle changes as told by your doctor. These may include: ? Getting regular exercise. Ask your doctor what activities are safe for you. ? Eating a heart-healthy diet. A diet and nutrition specialist (dietitian) can help you to learn healthy eating options. ? Staying at a healthy weight. ? Treating diabetes or high blood pressure, if needed. ? Lowering your stress. Activities such as yoga and relaxation techniques can help. General instructions  Pay attention to any changes in your symptoms. Tell your doctor about them or any new symptoms.  Avoid any activities that cause chest pain.  Keep all follow-up visits as told by your doctor. This is important. You may need more testing if your chest pain does not go away. Contact a doctor if:  Your chest pain does not go away.  You feel depressed.  You have a fever. Get help right away if:  Your chest pain is worse.  You have a cough that gets worse, or you cough up blood.  You have very bad (severe) pain in your belly (abdomen).  You pass out (faint).  You have either of these for no clear reason: ? Sudden chest discomfort. ? Sudden discomfort in your arms, back, neck, or jaw.  You have shortness of breath at any time.  You suddenly start to sweat, or your skin gets clammy.  You feel sick to your stomach (nauseous).  You throw up (vomit).  You suddenly feel lightheaded or dizzy.  You feel very weak or tired.  Your heart starts to beat fast, or it feels like it is skipping beats. These symptoms may be an emergency. Do not wait to see if the symptoms will go away. Get medical help right away. Call your local emergency services (911 in the [place] Do not drive yourself to the hospital. Summary  Chest pain can be caused by many  different conditions. The cause may be serious and need treatment right away. If you have chest pain, see your doctor right away.  Follow your doctor's instructions for taking medicines and making lifestyle changes.  Keep all follow-up visits as told by your doctor. This includes visits for any further testing if your chest pain does not go away.  Be sure to know the signs that show that your condition has become worse. Get help right away if you have these symptoms. This information is not intended to replace advice given to you by your health care provider. Make sure you discuss any questions you have with your health care provider. Document Released: 07/24/2007 Document Revised: 08/07/2017 Document Reviewed: 08/07/2017 Elsevier Interactive Patient Education  [employer].    If you need a refill on your cardiac medications before your next appointment, please call your pharmacy.

## 2018-03-20 NOTE — Progress Notes (Signed)
Patient Care Team: Corwin Levins, MD as PCP - General Wendall Stade, MD as PCP - Cardiology (Cardiology) Duke Salvia, MD as PCP - Electrophysiology (Cardiology) Wendall Stade, MD (Cardiology)   HPI  Jason Hogan is a 43 y.o. male Seen in follow-up for ventricular tachycardia with a history of ICD implantation and previous appropriate therapy;  he has a history of ischemic heart disease.    Hx of CAD Ant MI 2011-- Underwent Cath 9/19 for unstable angina  Found As Below to have progressive CAD   Readmitted about 2 weeks later with recurrent chest pain associated with variable heart rates. DATE TEST EF   3/17 Echo   30-35 %   7/17 Echo   40-45 %   9/19 Echo  30-35%   9/19 LHC  Cx 90% Stent/LAD 90% PTCA RCA T-old   Date Cr K  12/18 1.19 4.7          Device History: ICD implanted 2011 for primary prevention  History of appropriate therapy: Yes   3/17 VT-History of AAD therapy: Yes  ATP and shocks   Patient is been having some chest discomfort with exertion but primarily with stress with radiation into the jaw and arm.  Similar to discomfort this summer prior to intervention.  Never really resolved. No interval ventricular tachycardia  Tolerating mexilitene   Past Medical History:  Diagnosis Date  . Acute Myocardial Infarction 08/18/2009   Qualifier: Diagnosis of  By: Kem Parkinson    . Allergic rhinitis, cause unspecified 10/03/2011  . Anxiety 10/03/2011  . Asthma 09/20/2006   Qualifier: Diagnosis of  By: Jonny Ruiz MD, Len Blalock   . 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 Qualifier: Diagnosis of  By: Eden Emms, MD, Harrington Challenger   . CAD (coronary artery disease)   . Cholelithiasis 06/22/2007   Qualifier: Diagnosis of  By: Maris Berger   . Chronic systolic heart failure (HCC) 09/18/2009   Qualifier: Diagnosis of  By: Kem Parkinson    . Depressive disorder, not elsewhere  classified 06/22/2007   Qualifier: Diagnosis of  By: Maris Berger   . History of gout   . History of kidney stones   . Hyperlipidemia 81/2011  . Hypertension   . Impaired glucose tolerance 10/05/2012  . Insomnia, unspecified 06/22/2007   Qualifier: Diagnosis of  By: Jonny Ruiz MD, Len Blalock   . Ischemic cardiomyopathy 10/20/2009   Qualifier: Diagnosis of  By: Graciela Husbands, MD, [facility], Ty Hilts   . 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 Migraine Headache 09/18/2009   Qualifier: Diagnosis of  By: Kem Parkinson    . Renal calculus   . Renal Insufficiency 09/18/2009   Qualifier: Diagnosis of  By: Kem Parkinson    . Respiratory failure (HCC) 08/2009  . Rhabdomyolysis 09/18/2009   Qualifier: Diagnosis of  By: Kem Parkinson      Past Surgical History:  Procedure Laterality Date  . CARDIAC CATHETERIZATION N/A 05/17/2015   Procedure: Left Heart Cath and Coronary Angiography;  Surgeon: Wendall Stade, MD;  Location: [facility] INVASIVE CV LAB;  Service: Cardiovascular;  Laterality: N/A;  . CARDIAC CATHETERIZATION  2012  . CORONARY ANGIOPLASTY WITH STENT PLACEMENT  2011  . CORONARY STENT INTERVENTION N/A 11/14/2017   Procedure: CORONARY STENT INTERVENTION;  Surgeon: Kathleene Hazel, MD;  Location: [facility] INVASIVE CV LAB;  Service: Cardiovascular;  Laterality: N/A;  . ICD IMPLANT  11/2009   "w/pacing function"  .  RIGHT/LEFT HEART CATH AND CORONARY ANGIOGRAPHY N/A 11/14/2017   Procedure: RIGHT/LEFT HEART CATH AND CORONARY ANGIOGRAPHY;  Surgeon: Kathleene HazelMcAlhany, Christopher D, MD;  Location: [facility] INVASIVE CV LAB;  Service: Cardiovascular;  Laterality: N/A;    Current Outpatient Medications  Medication Sig Dispense Refill  . albuterol (PROAIR HFA) 108 (90 Base) MCG/ACT inhaler Inhale 1-2 puffs into the lungs every 6 (six) hours as needed for wheezing or shortness of breath. 1 Inhaler 0  . ALPRAZolam (XANAX) 0.5 MG tablet Take 1 tablet (0.5 mg total) by mouth daily as needed for anxiety. 30 tablet 2  .  anastrozole (ARIMIDEX) 1 MG tablet Take 1 mg by mouth daily.    [name]. aspirin EC 81 MG tablet Take 81 mg by mouth daily.    [name]. atorvastatin (LIPITOR) 80 MG tablet Take 1 tablet (80 mg total) by mouth daily. 90 tablet 3  . budesonide-formoterol (SYMBICORT) 80-4.5 MCG/ACT inhaler Inhale 2 puffs into the lungs 2 (two) times daily as needed (wheezing). 1 Inhaler 11  . carvedilol (COREG) 6.25 MG tablet TAKE 1 TABLET BY MOUTH TWICE A DAY 180 tablet 3  . Coenzyme Q10 (CO Q 10) 100 MG CAPS Take 100 mg by mouth daily.     [name]. ENTRESTO 24-26 MG TAKE 1 TABLET BY MOUTH TWICE A DAY 180 tablet 3  . fluticasone (FLONASE) 50 MCG/ACT nasal spray PLACE 2 SPRAYS INTO BOTH NOSTRILS DAILY AS NEEDED (SEASONAL ALLERGIES). 16 g 3  . KLOR-CON M20 20 MEQ tablet TAKE 2 TABLETS BY MOUTH EVERY DAY 180 tablet 3  . metolazone (ZAROXOLYN) 5 MG tablet TAKE 1 TABLET (5 MG TOTAL) BY MOUTH DAILY AS NEEDED (FOR EDEMA). 30 tablet 9  . mexiletine (MEXITIL) 200 MG capsule TAKE 1 CAPSULE BY MOUTH TWICE A DAY 60 capsule 11  . prasugrel (EFFIENT) 10 MG TABS tablet Take 1 tablet (10 mg total) by mouth daily. 90 tablet 3  . spironolactone (ALDACTONE) 25 MG tablet Take 0.5 tablets (12.5 mg total) by mouth daily. 45 tablet 3  . tiZANidine (ZANAFLEX) 4 MG tablet TAKE 1 TABLET BY MOUTH EVERY 6 HOURS AS NEEDED SPASMS OF THE MUSCLE 60 tablet 2  . torsemide (DEMADEX) 20 MG tablet Take 2 tablets (40 mg total) by mouth 2 (two) times daily. 120 tablet 11  . traMADol (ULTRAM) 50 MG tablet Take 1 tablet (50 mg total) by mouth every 12 (twelve) hours as needed. 30 tablet 0  . Vitamin D, Ergocalciferol, (DRISDOL) 1.25 MG (50000 UT) CAPS capsule TAKE 1 CAPSULE (50,000 UNITS TOTAL) BY MOUTH EVERY 7 (SEVEN) DAYS. 12 capsule 0  . nitroGLYCERIN (NITROSTAT) 0.4 MG SL tablet Place 1 tablet (0.4 mg total) under the tongue every 5 (five) minutes as needed for chest pain. 90 tablet 3   No current facility-administered medications for this visit.     Allergies    Allergen Reactions  . Levaquin [Levofloxacin] Other (See Comments)    Joint pain  . Ace Inhibitors Cough  . Crestor [Rosuvastatin] Other (See Comments)    "Sweet cravings"  . Lunesta [Eszopiclone] Other (See Comments)    "Bitter taste in mouth"  . Zocor [Simvastatin] Other (See Comments)    Memory problem      Review of Systems negative except from HPI and PMH  Physical Exam BP 126/88   Pulse 84   Ht 6\' 2"  (1.88 m)   Wt 216 lb 9.6 oz (98.2 kg)   SpO2 96%   BMI 27.81 kg/m  Well developed and nourished in no  acute distress HENT normal Neck supple with JVP-flat Clear Regular rate and rhythm, no murmurs or gallops Abd-soft with active BS No Clubbing cyanosis edema Skin-warm and dry A & Oriented  Grossly normal sensory and motor function    ECG: sinus 84 16/10/36    Assessment and  Plan  Ventricular tachycardia  Palpitations  Chest discomfort  Ischemic Cardiomyopathy  Chronic systolic heart failure  Implantable defibrillator-[employer]    Euvolemic continue current meds  Patient is having recurrent chest discomfort.  This has persisted since his second hospitalization.  It is unpredictably associated with palpitations.  Spoke with Dr PN and discussed two approaches, the first to use ranolazine as an antianginal, other medication choices been limited because of hypotension and the other was to consider repeat catheterization for clarity for large vessel disease and then the anticipation of use of ranolazine if nothing more identified.  His palpitations may be related to the above so for now have thought we would hold off on monitoring until the aforementioned issue was resolved.  I have suggested the use of an [employer] monitor.  More than 50% of 40 min was spent in counseling related to the above

## 2018-03-24 LAB — CUP PACEART INCLINIC DEVICE CHECK
Battery Remaining Longevity: 34 mo
Date Time Interrogation Session: 20200131202618
HighPow Impedance: 50.9961
Implantable Lead Location: 753860
Implantable Lead Model: 7121
Lead Channel Pacing Threshold Amplitude: 1.25 V
Lead Channel Pacing Threshold Pulse Width: 1 ms
Lead Channel Setting Sensing Sensitivity: 0.5 mV
MDC IDC LEAD IMPLANT DT: 20111010
MDC IDC MSMT LEADCHNL RV IMPEDANCE VALUE: 575 Ohm
MDC IDC MSMT LEADCHNL RV PACING THRESHOLD AMPLITUDE: 1.25 V
MDC IDC MSMT LEADCHNL RV PACING THRESHOLD PULSEWIDTH: 1 ms
MDC IDC MSMT LEADCHNL RV SENSING INTR AMPL: 11.9 mV
MDC IDC PG IMPLANT DT: 20111010
MDC IDC PG SERIAL: 615318
MDC IDC SET LEADCHNL RV PACING AMPLITUDE: 4 V
MDC IDC SET LEADCHNL RV PACING PULSEWIDTH: 1 ms
MDC IDC STAT BRADY RV PERCENT PACED: 0 %

## 2018-04-17 NOTE — Progress Notes (Signed)
Cardiology Office Note   Date:  04/22/2018   ID:  Jason Hogan, DOB 07/24/1975, MRN [id number]  PCP:  [name], MD  Cardiologist:  Dr. [name]    No chief complaint on file.     History of Present Illness: Jason Hogan is a 43 y.o. male F/U for CAD and ischemic DCM with AICD     He has a history of CAD with large anterior MI and collateralized silent distal RCA from PCI in 2011 and recent PCIwith balloon angioplasty to stented segment of LAD and DES x1 to mid circumflex on 0/93/8182,XHBZJIR systolic CHF with EF of 67-89% from Echo on 11/10/2017, ischemic cardiomyopathy s/p AICD, prior VT/ICD shock in 10/2016 (on Mexiletine), HTN, HLD, elevated sed rate followed by Rheumatology, History of allergies and asthma   AICD implant complicated by hematoma  Has single chamber device with no counter so unable to assess palpitations    Had care at [facility] for a while and re established June 2017   Seen by Dr [name] 11/12/16  after AICD d/c;s  Found to have appropriate ATP and shock for VT. Has been seeing [name] at [place] Less than 2% PVC burden last EP visit October 2018 with mexiletine  Also seen by ENT there 09/2014 for epistaxis with negative evaluation.  Seen by urology for Low T on clomid but caused irritability and then anastrazole  With improvement in fatigue and libido  Did not tolerate aldactone post m.i. with excessive fatiigue and also pruritis but on it now  Seen by Dr [name] and [name] for polyarthragia and hematuria.  Noted labs done 07/17/16 with ESR 88 and positive ANA UA with large Hb has had stones in past Cr stable .55  Malaise started beginning of May was Rx with levaquin but felt worse  Reviewed CT abdomen 07/05/16 normal no etiology for hematuria   Has right shoulder issue Can't have MRI to see if rotator cuff or labrum Seeing Dr Tamera Punt    September had more angina and dyspnea:  TTE with EF 30-35% septal apical and inferior wall  hypokinesis   Pt had cardiac cath 11/14/17 and High-grade obstruction found in the circumflex successfully treated with stent and focal LAD ISR treated with angioplasty.  Both successful.  Re-admitted 11/25/17 with chest pain.  Described a spins and needles in his chest.  Also heart racing. But the pins and needles turned into chest pressure and he came to ER   ICD interrogated with no arrhthymias. troponins neg.  euvolemic d/c medical Rx   Slipped in shower and fracture left wrist including scaphoid Hand in splint  Depressed about long term prognosis Seeing [name]  Notes from Dr [name] 03/20/18 indicated some SSCP radiating to jaw. His notes indicated discussing cath or Addition of Ranexa but neither done  In talking to [facility] pain was atypical and reproducable to palpation and his insurance would not have covered cath at that time  Past Medical History:  Diagnosis Date  . Acute Myocardial Infarction 08/18/2009   Qualifier: Diagnosis of  By: [name]    . Allergic rhinitis, cause unspecified 10/03/2011  . Anxiety 10/03/2011  . Asthma 09/20/2006   Qualifier: Diagnosis of  By: [name], [name]   . 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 Qualifier: Diagnosis of  By: [name], MD, [name]   . CAD (coronary artery disease)   . Cholelithiasis 06/22/2007   Qualifier: Diagnosis  of  By: [name]   . Chronic systolic heart failure ([place]) 09/18/2009   Qualifier: Diagnosis of  By: [name]    . Depressive disorder, not elsewhere classified 06/22/2007   Qualifier: Diagnosis of  By: [name]   . History of gout   . History of kidney stones   . Hyperlipidemia 81/2011  . Hypertension   . Impaired glucose tolerance 10/05/2012  . Insomnia, unspecified 06/22/2007   Qualifier: Diagnosis of  By: [name], [name]   . Ischemic cardiomyopathy 10/20/2009   Qualifier: Diagnosis of  By: [name],  MD, [facility], Mack Guise   . 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 Migraine Headache 09/18/2009   Qualifier: Diagnosis of  By: [name]    . Renal calculus   . Renal Insufficiency 09/18/2009   Qualifier: Diagnosis of  By: [name]    . Respiratory failure ([place]) 08/2009  . Rhabdomyolysis 09/18/2009   Qualifier: Diagnosis of  By: [name]      Past Surgical History:  Procedure Laterality Date  . CARDIAC CATHETERIZATION N/A 05/17/2015   Procedure: Left Heart Cath and Coronary Angiography;  Surgeon: [name], MD;  Location: [place] [facility];  Service: Cardiovascular;  Laterality: N/A;  . CARDIAC CATHETERIZATION  2012  . CORONARY ANGIOPLASTY WITH STENT PLACEMENT  2011  . CORONARY STENT INTERVENTION N/A 11/14/2017   Procedure: CORONARY STENT INTERVENTION;  Surgeon: Burnell Blanks, MD;  Location: [place] [facility];  Service: Cardiovascular;  Laterality: N/A;  . ICD IMPLANT  11/2009   "w/pacing function"  . RIGHT/LEFT HEART CATH AND CORONARY ANGIOGRAPHY N/A 11/14/2017   Procedure: RIGHT/LEFT HEART CATH AND CORONARY ANGIOGRAPHY;  Surgeon: Burnell Blanks, MD;  Location: [place] [facility];  Service: Cardiovascular;  Laterality: N/A;     Current Outpatient Medications  Medication Sig Dispense Refill  . albuterol (PROAIR HFA) 108 (90 Base) MCG/ACT inhaler Inhale 1-2 puffs into the lungs every 6 (six) hours as needed for wheezing or shortness of breath. 1 Inhaler 0  . ALPRAZolam (XANAX) 0.5 MG tablet Take 1 tablet (0.5 mg total) by mouth daily as needed for anxiety. 30 tablet 2  . anastrozole (ARIMIDEX) 1 MG tablet Take 1 mg by mouth daily.    [name] aspirin EC 81 MG tablet Take 81 mg by mouth daily.    [name] atorvastatin (LIPITOR) 80 MG tablet Take 1 tablet (80 mg total) by mouth daily. 90 tablet 3  . budesonide-formoterol (SYMBICORT) 80-4.5 MCG/ACT inhaler Inhale 2 puffs into the lungs 2 (two) times daily as needed (wheezing). 1 Inhaler 11  . carvedilol (COREG)  6.25 MG tablet TAKE 1 TABLET BY MOUTH TWICE A DAY 180 tablet 3  . Coenzyme Q10 (CO Q 10) 100 MG CAPS Take 100 mg by mouth daily.     . fluticasone (FLONASE) 50 MCG/ACT nasal spray PLACE 2 SPRAYS INTO BOTH NOSTRILS DAILY AS NEEDED (SEASONAL ALLERGIES). 16 g 3  . KLOR-CON M20 20 MEQ tablet TAKE 2 TABLETS BY MOUTH EVERY DAY 180 tablet 3  . metolazone (ZAROXOLYN) 5 MG tablet TAKE 1 TABLET (5 MG TOTAL) BY MOUTH DAILY AS NEEDED (FOR EDEMA). 30 tablet 9  . mexiletine (MEXITIL) 200 MG capsule TAKE 1 CAPSULE BY MOUTH TWICE A DAY 60 capsule 11  . prasugrel (EFFIENT) 10 MG TABS tablet Take 1 tablet (10 mg total) by mouth daily. 90 tablet 3  . sacubitril-valsartan (ENTRESTO) 24-26 MG Take 1 tablet by mouth 2 (two) times daily. 180 tablet  3  . spironolactone (ALDACTONE) 25 MG tablet Take 0.5 tablets (12.5 mg total) by mouth daily. 45 tablet 3  . tiZANidine (ZANAFLEX) 4 MG tablet TAKE 1 TABLET BY MOUTH EVERY 6 HOURS AS NEEDED SPASMS OF THE MUSCLE 60 tablet 2  . torsemide (DEMADEX) 20 MG tablet Take 2 tablets (40 mg total) by mouth 2 (two) times daily. 120 tablet 11  . traMADol (ULTRAM) 50 MG tablet Take 1 tablet (50 mg total) by mouth every 12 (twelve) hours as needed. 30 tablet 0  . Vitamin D, Ergocalciferol, (DRISDOL) 1.25 MG (50000 UT) CAPS capsule TAKE 1 CAPSULE (50,000 UNITS TOTAL) BY MOUTH EVERY 7 (SEVEN) DAYS. 12 capsule 0  . nitroGLYCERIN (NITROSTAT) 0.4 MG SL tablet Place 1 tablet (0.4 mg total) under the tongue every 5 (five) minutes as needed for chest pain. 90 tablet 3   No current facility-administered medications for this visit.     Allergies:   Levaquin [levofloxacin]; Ace inhibitors; Crestor [rosuvastatin]; Lunesta [eszopiclone]; and Zocor [simvastatin]    Social History:  The patient  reports that he quit smoking about 24 years ago. His smoking use included cigarettes. He has a 0.20 pack-year smoking history. He has never used smokeless tobacco. He reports current alcohol use. He reports that  he does not use drugs.   Family History:  The patient's family history includes Diabetes in an other family member; Heart attack in his father; Heart disease in an other family member; Hypothyroidism in an other family member.    ROS:  General:no colds or fevers, no weight changes Skin:no rashes or ulcers HEENT:no blurred vision, no congestion CV:see HPI PUL:see HPI GI:no diarrhea constipation or melena, no indigestion GU:no hematuria, no dysuria MS:no joint pain, no claudication Neuro:no syncope, no lightheadedness Endo:no diabetes, no thyroid disease  Wt Readings from Last 3 Encounters:  04/22/18 96.2 kg  03/20/18 98.2 kg  03/09/18 98 kg     PHYSICAL EXAM: VS:  BP 110/74   Pulse 75   Ht '6\' 2"'  (1.88 m)   Wt 96.2 kg   SpO2 98%   BMI 27.22 kg/m  , BMI Body mass index is 27.22 kg/m. Affect appropriate Healthy:  appears stated age 32: normal Neck supple with no adenopathy JVP normal no bruits no thyromegaly Lungs clear with no wheezing and good diaphragmatic motion Heart:  S1/S2 no murmur, no rub, gallop or click PMI enlarged AICD under left clavicle  Abdomen: benighn, BS positve, no tenderness, no AAA no bruit.  No HSM or HJR Distal pulses intact with no bruits No edema Neuro non-focal Skin warm and dry No muscular weakness     EKG:   12/12/17 SR rate 55 possible old anterior MI no q waves poor R wave progression    Recent Labs: 04/29/2017: B Natriuretic Peptide 78.5 07/01/2017: TSH 1.25 10/02/2017: Magnesium 2.0 11/26/2017: Hemoglobin 14.1; Platelets 250 01/05/2018: ALT 53 01/29/2018: BUN 10; Creatinine, Ser 1.19; Potassium 4.2; Sodium 143    Lipid Panel    Component Value Date/Time   CHOL 104 01/05/2018 0920   TRIG 63 01/05/2018 0920   HDL 30 (L) 01/05/2018 0920   CHOLHDL 3.5 01/05/2018 0920   CHOLHDL 5 07/01/2017 1430   VLDL 20.2 07/01/2017 1430   LDLCALC 61 01/05/2018 0920   LDLDIRECT 101.0 10/24/2015 1554       Other studies  Reviewed: Additional studies/ records that were reviewed today include:  ECHO 11/10/17. Study Conclusions  - Left ventricle: Septal apical and inferior hypokinesis. The   cavity  size was moderately dilated. Wall thickness was normal.   Systolic function was moderately to severely reduced. The   estimated ejection fraction was in the range of 30% to 35%. Left   ventricular diastolic function parameters were normal. - Left atrium: The atrium was mildly dilated. - Atrial septum: No defect or patent foramen ovale was identified.  Cardiac cath 11/14/17   Prox RCA to Mid RCA lesion is 100% stenosed.  Prox Cx lesion is 99% stenosed.  A drug-eluting stent was successfully placed using a STENT SYNERGY DES 3.5X20.  Post intervention, there is a 0% residual stenosis.  Ost LAD to Prox LAD lesion is 70% stenosed.  Balloon angioplasty was performed using a BALLOON SAPPHIRE [place] 3.0X8.  Post intervention, there is a 20% residual stenosis.   1. Patent LAD stent with focal area of severe narrowing within the stent. Successful PTCA with balloon angioplasty only of the LAD stented segment.  2. Severe stenosis mid Circumflex artery. Successful PTCA/DES x 1 mid Circumflex 3. Chronic occlusion mid RCA. The distal RCA fills from left to right collaterals.   Recommendations:   Recommend uninterrupted dual antiplatelet therapy with Aspirin 18m daily and Effient 10 mg daily for a minimum of 12 months (ACS - Class I recommendation).  ASSESSMENT AND PLAN:  1.  CAD  Chronically occluded mid RCA with collaterals. ISR LAD stent with PCI 11/14/17 and new stent to circumflex at same time  Given 3 vessel CAD continue life long DAT Using Effient No concerning angina at this time chest wall pain is reproducible to palpation and not like his MI pain   2.  ICM with ICD.  euvolemic today, no ICD discharges  On mexitil for suppression CHF stable  On torsemide, coreg,  spironolactone, entresto- unable to increase  due to BP.      3.  HLD continue statin  4.  HTN Well controlled.  Continue current medications and low sodium Dash type diet.    5. Rheumatology:  History of positive ANA and elevated ESR as well as asthma. History of sinusitis and myalgias etiology unclear  Last ESR May 2019 48   6 Ortho:  F/U ZCharlann Boxerfor left hand pain improving without surgery   Current medicines are reviewed with the patient today.  The patient Has no concerns regarding medicines.  The following changes have been made:  See above Labs/ tests ordered today include:see above  Disposition:   FU:  see above  Signed, PJenkins Rouge MD  04/22/2018 9:58 AM    [place] [address] [address] N[place]Phone: [phone] Fax: [phone]

## 2018-04-22 ENCOUNTER — Ambulatory Visit (INDEPENDENT_AMBULATORY_CARE_PROVIDER_SITE_OTHER): Payer: Self-pay | Admitting: Cardiovascular Disease

## 2018-04-22 ENCOUNTER — Encounter: Payer: Self-pay | Admitting: Cardiovascular Disease

## 2018-04-22 VITALS — BP 110/74 | HR 75 | Ht 74.0 in | Wt 212.0 lb

## 2018-04-22 DIAGNOSIS — E7800 Pure hypercholesterolemia, unspecified: Secondary | ICD-10-CM

## 2018-04-22 DIAGNOSIS — I25119 Atherosclerotic heart disease of native coronary artery with unspecified angina pectoris: Secondary | ICD-10-CM

## 2018-04-22 DIAGNOSIS — I10 Essential (primary) hypertension: Secondary | ICD-10-CM

## 2018-04-22 DIAGNOSIS — Z95810 Presence of automatic (implantable) cardiac defibrillator: Secondary | ICD-10-CM

## 2018-04-22 NOTE — Patient Instructions (Signed)

## 2018-05-05 ENCOUNTER — Other Ambulatory Visit: Payer: Self-pay | Admitting: Internal Medicine

## 2018-05-06 NOTE — Telephone Encounter (Signed)
Done erx 

## 2018-05-11 ENCOUNTER — Other Ambulatory Visit: Payer: Self-pay

## 2018-05-11 ENCOUNTER — Ambulatory Visit (INDEPENDENT_AMBULATORY_CARE_PROVIDER_SITE_OTHER): Payer: Self-pay | Admitting: *Deleted

## 2018-05-11 DIAGNOSIS — I5022 Chronic systolic (congestive) heart failure: Secondary | ICD-10-CM

## 2018-05-11 DIAGNOSIS — I255 Ischemic cardiomyopathy: Secondary | ICD-10-CM

## 2018-05-12 ENCOUNTER — Other Ambulatory Visit: Payer: Self-pay | Admitting: Internal Medicine

## 2018-05-12 DIAGNOSIS — J4521 Mild intermittent asthma with (acute) exacerbation: Secondary | ICD-10-CM

## 2018-05-16 LAB — CUP PACEART REMOTE DEVICE CHECK
Date Time Interrogation Session: 20200320065243
HighPow Impedance: 46 Ohm
Implantable Lead Location: 753860
Implantable Lead Model: 7121
Implantable Pulse Generator Implant Date: 20111010
Lead Channel Pacing Threshold Amplitude: 1.25 V
Lead Channel Setting Pacing Amplitude: 4 V
Lead Channel Setting Sensing Sensitivity: 0.5 mV
MDC IDC LEAD IMPLANT DT: 20111010
MDC IDC MSMT BATTERY REMAINING LONGEVITY: 31 mo
MDC IDC MSMT BATTERY REMAINING PERCENTAGE: 31 %
MDC IDC MSMT BATTERY VOLTAGE: 2.86 V
MDC IDC MSMT LEADCHNL RV IMPEDANCE VALUE: 700 Ohm
MDC IDC MSMT LEADCHNL RV PACING THRESHOLD PULSEWIDTH: 1 ms
MDC IDC MSMT LEADCHNL RV SENSING INTR AMPL: 11.9 mV
MDC IDC SET LEADCHNL RV PACING PULSEWIDTH: 1 ms
MDC IDC STAT BRADY RV PERCENT PACED: 0 %
Pulse Gen Serial Number: 615318

## 2018-05-20 NOTE — Progress Notes (Signed)
Remote ICD transmission.   

## 2018-07-03 ENCOUNTER — Encounter: Payer: Self-pay | Admitting: Internal Medicine

## 2018-07-03 ENCOUNTER — Ambulatory Visit (INDEPENDENT_AMBULATORY_CARE_PROVIDER_SITE_OTHER): Payer: Self-pay | Admitting: Internal Medicine

## 2018-07-03 ENCOUNTER — Other Ambulatory Visit: Payer: Self-pay

## 2018-07-03 ENCOUNTER — Other Ambulatory Visit (INDEPENDENT_AMBULATORY_CARE_PROVIDER_SITE_OTHER): Payer: Self-pay

## 2018-07-03 VITALS — BP 116/78 | HR 94 | Ht 74.0 in | Wt 212.0 lb

## 2018-07-03 DIAGNOSIS — R7302 Impaired glucose tolerance (oral): Secondary | ICD-10-CM

## 2018-07-03 DIAGNOSIS — Z0000 Encounter for general adult medical examination without abnormal findings: Secondary | ICD-10-CM

## 2018-07-03 DIAGNOSIS — J4521 Mild intermittent asthma with (acute) exacerbation: Secondary | ICD-10-CM

## 2018-07-03 LAB — BASIC METABOLIC PANEL
BUN: 16 mg/dL (ref 6–23)
CO2: 30 mEq/L (ref 19–32)
Calcium: 9.7 mg/dL (ref 8.4–10.5)
Chloride: 100 mEq/L (ref 96–112)
Creatinine, Ser: 1.4 mg/dL (ref 0.40–1.50)
GFR: 55.47 mL/min — ABNORMAL LOW (ref >60.00)
Glucose, Bld: 114 mg/dL — ABNORMAL HIGH (ref 70–99)
Potassium: 3.4 mEq/L — ABNORMAL LOW (ref 3.5–5.1)
Sodium: 142 mEq/L (ref 135–145)

## 2018-07-03 LAB — HEPATIC FUNCTION PANEL
ALT: 25 U/L (ref 0–53)
AST: 30 U/L (ref 0–37)
Albumin: 4.8 g/dL (ref 3.5–5.2)
Alkaline Phosphatase: 68 U/L (ref 39–117)
Bilirubin, Direct: 0.2 mg/dL (ref 0.0–0.3)
Total Bilirubin: 0.6 mg/dL (ref 0.2–1.2)
Total Protein: 8.2 g/dL (ref 6.0–8.3)

## 2018-07-03 LAB — URINALYSIS, ROUTINE W REFLEX MICROSCOPIC
Bilirubin Urine: NEGATIVE
Ketones, ur: NEGATIVE
Leukocytes,Ua: NEGATIVE
Nitrite: NEGATIVE
Specific Gravity, Urine: 1.01 (ref 1.000–1.030)
Total Protein, Urine: NEGATIVE
Urine Glucose: NEGATIVE
Urobilinogen, UA: 0.2 (ref 0.0–1.0)
WBC, UA: NONE SEEN (ref 0–?)
pH: 6.5 (ref 5.0–8.0)

## 2018-07-03 LAB — LIPID PANEL
Cholesterol: 132 mg/dL (ref 0–200)
HDL: 31.2 mg/dL — ABNORMAL LOW (ref >39.00)
LDL Cholesterol: 63 mg/dL (ref 0–99)
NonHDL: 100.92
Total CHOL/HDL Ratio: 4
Triglycerides: 188 mg/dL — ABNORMAL HIGH (ref 0.0–149.0)
VLDL: 37.6 mg/dL (ref 0.0–40.0)

## 2018-07-03 LAB — CBC WITH DIFFERENTIAL/PLATELET
Basophils Absolute: 0.1 10*3/uL (ref 0.0–0.1)
Basophils Relative: 0.9 % (ref 0.0–3.0)
Eosinophils Absolute: 0.2 10*3/uL (ref 0.0–0.7)
Eosinophils Relative: 2.1 % (ref 0.0–5.0)
HCT: 50.3 % (ref 39.0–52.0)
Hemoglobin: 16.9 g/dL (ref 13.0–17.0)
Lymphocytes Relative: 26.3 % (ref 12.0–46.0)
Lymphs Abs: 2.1 10*3/uL (ref 0.7–4.0)
MCHC: 33.7 g/dL (ref 30.0–36.0)
MCV: 83.6 fl (ref 78.0–100.0)
Monocytes Absolute: 0.6 10*3/uL (ref 0.1–1.0)
Monocytes Relative: 7.1 % (ref 3.0–12.0)
Neutro Abs: 4.9 10*3/uL (ref 1.4–7.7)
Neutrophils Relative %: 63.6 % (ref 43.0–77.0)
Platelets: 226 10*3/uL (ref 150.0–400.0)
RBC: 6.01 Mil/uL — ABNORMAL HIGH (ref 4.22–5.81)
RDW: 15.4 % (ref 11.5–15.5)
WBC: 7.8 10*3/uL (ref 4.0–10.5)

## 2018-07-03 LAB — TSH: TSH: 1.14 u[IU]/mL (ref 0.35–4.50)

## 2018-07-03 MED ORDER — TIZANIDINE HCL 4 MG PO TABS: ORAL_TABLET | ORAL | 2 refills | Status: DC

## 2018-07-03 MED ORDER — ALBUTEROL SULFATE HFA 108 (90 BASE) MCG/ACT IN AERS: 1.0000 | INHALATION_SPRAY | Freq: Four times a day (QID) | RESPIRATORY_TRACT | 11 refills | Status: DC | PRN

## 2018-07-03 MED ORDER — BUDESONIDE-FORMOTEROL FUMARATE 80-4.5 MCG/ACT IN AERO: 2.0000 | INHALATION_SPRAY | Freq: Two times a day (BID) | RESPIRATORY_TRACT | 11 refills | Status: DC | PRN

## 2018-07-03 NOTE — Progress Notes (Signed)
Subjective:    Patient ID: Jason BlalockBinoy S Mariani, male    DOB: 1976-01-16, 43 y.o.   MRN: [id number]  HPI  Here for wellness and f/u;  Overall doing ok;  Pt denies Chest pain, worsening SOB, DOE, wheezing, orthopnea, PND, worsening LE edema, palpitations, dizziness or syncope.  Pt denies neurological change such as new headache, facial or extremity weakness.  Pt denies polydipsia, polyuria, or low sugar symptoms. Pt states overall good compliance with treatment and medications, good tolerability, and has been trying to follow appropriate diet.  Pt denies worsening depressive symptoms, suicidal ideation or panic. No fever, night sweats, wt loss, loss of appetite, or other constitutional symptoms.  Pt states good ability with ADL's, has low fall risk, home safety reviewed and adequate, no other significant changes in hearing or vision, and only occasionally active with exercise.  Had left scaphoid fx last yr, had stent to circumflex, xanax working well for stress, tyring to minimize. See urology yearly with PSA.    Past Medical History:  Diagnosis Date  . Acute Myocardial Infarction 08/18/2009   Qualifier: Diagnosis of  By: Jason Hogan, Jason Hogan    . Allergic rhinitis, cause unspecified 10/03/2011  . Anxiety 10/03/2011  . Asthma 09/20/2006   Qualifier: Diagnosis of  By: Jason Hogan, Jason BlalockJames Hogan   . 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 Qualifier: Diagnosis of  By: Jason EmmsNishan, Hogan, Jason Hogan, Jason Hogan   . CAD (coronary artery disease)   . Cholelithiasis 06/22/2007   Qualifier: Diagnosis of  By: Jason Hogan, Jason Hogan   . Chronic systolic heart failure (H[facility]) 09/18/2009   Qualifier: Diagnosis of  By: Jason Hogan, Jason Hogan    . Depressive disorder, not elsewhere classified 06/22/2007   Qualifier: Diagnosis of  By: Jason Hogan, Jason Hogan   . History of gout   . History of kidney stones   . Hyperlipidemia 81/2011  . Hypertension   . Impaired glucose tolerance  10/05/2012  . Insomnia, unspecified 06/22/2007   Qualifier: Diagnosis of  By: Jason Hogan, Jason BlalockJames Hogan   . Ischemic cardiomyopathy 10/20/2009   Qualifier: Diagnosis of  By: Jason HusbandsKlein, Hogan, [facility] And Joint HospitalFACC, Jason Hogan   . 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 Migraine Headache 09/18/2009   Qualifier: Diagnosis of  By: Jason Hogan, Jason Hogan    . Renal calculus   . Renal Insufficiency 09/18/2009   Qualifier: Diagnosis of  By: Jason Hogan, Jason Hogan    . Respiratory failure (H[facility]) 08/2009  . Rhabdomyolysis 09/18/2009   Qualifier: Diagnosis of  By: Jason Hogan, Jason Hogan     Past Surgical History:  Procedure Laterality Date  . CARDIAC CATHETERIZATION N/A 05/17/2015   Procedure: Left Heart Cath and Coronary Angiography;  Surgeon: Jason StadePeter C Nishan, Hogan;  Location: [facility] INVASIVE [facility] LAB;  Service: Cardiovascular;  Laterality: N/A;  . CARDIAC CATHETERIZATION  2012  . CORONARY ANGIOPLASTY WITH STENT PLACEMENT  2011  . CORONARY STENT INTERVENTION N/A 11/14/2017   Procedure: CORONARY STENT INTERVENTION;  Surgeon: Jason Hogan, Jason Hogan, Hogan;  Location: [facility] INVASIVE CV LAB;  Service: Cardiovascular;  Laterality: N/A;  . ICD IMPLANT  11/2009   "Hogan/pacing function"  . RIGHT/LEFT HEART CATH AND CORONARY ANGIOGRAPHY N/A 11/14/2017   Procedure: RIGHT/LEFT HEART CATH AND CORONARY ANGIOGRAPHY;  Surgeon: Jason Hogan, Jason Hogan, Hogan;  Location: [facility] INVASIVE CV LAB;  Service: Cardiovascular;  Laterality: N/A;    reports that he quit smoking about 24 years ago. His smoking use included cigarettes. He has a 0.20 pack-year smoking history. He has never used smokeless tobacco.  He reports current alcohol use. He reports that he does not use drugs. family history includes Diabetes in an other family member; Heart attack in his father; Heart disease in an other family member; Hypothyroidism in an other family member. Allergies  Allergen Reactions  . Levaquin [Levofloxacin] Other (See Comments)    Joint pain  . Ace Inhibitors Cough  . Crestor [Rosuvastatin] Other (See  Comments)    "Sweet cravings"  . Lunesta [Eszopiclone] Other (See Comments)    "Bitter taste in mouth"  . Zocor [Simvastatin] Other (See Comments)    Memory problem   Current Outpatient Medications on File Prior to Visit  Medication Sig Dispense Refill  . ALPRAZolam (XANAX) 0.5 MG tablet TAKE 1 TABLET (0.5 MG TOTAL) BY MOUTH DAILY AS NEEDED FOR ANXIETY. 30 tablet 2  . anastrozole (ARIMIDEX) 1 MG tablet Take 1 mg by mouth daily.    [name] aspirin EC 81 MG tablet Take 81 mg by mouth daily.    [name] atorvastatin (LIPITOR) 80 MG tablet Take 1 tablet (80 mg total) by mouth daily. 90 tablet 3  . carvedilol (COREG) 6.25 MG tablet TAKE 1 TABLET BY MOUTH TWICE A DAY 180 tablet 3  . Coenzyme Q10 (CO Q 10) 100 MG CAPS Take 100 mg by mouth daily.     . fluticasone (FLONASE) 50 MCG/ACT nasal spray PLACE 2 SPRAYS INTO BOTH NOSTRILS DAILY AS NEEDED (SEASONAL ALLERGIES). 16 g 3  . KLOR-CON M20 20 MEQ tablet TAKE 2 TABLETS BY MOUTH EVERY DAY 180 tablet 3  . metolazone (ZAROXOLYN) 5 MG tablet TAKE 1 TABLET (5 MG TOTAL) BY MOUTH DAILY AS NEEDED (FOR EDEMA). 30 tablet 9  . mexiletine (MEXITIL) 200 MG capsule TAKE 1 CAPSULE BY MOUTH TWICE A DAY 60 capsule 11  . prasugrel (EFFIENT) 10 MG TABS tablet Take 1 tablet (10 mg total) by mouth daily. 90 tablet 3  . sacubitril-valsartan (ENTRESTO) 24-26 MG Take 1 tablet by mouth 2 (two) times daily. 180 tablet 3  . spironolactone (ALDACTONE) 25 MG tablet Take 0.5 tablets (12.5 mg total) by mouth daily. 45 tablet 3  . torsemide (DEMADEX) 20 MG tablet Take 2 tablets (40 mg total) by mouth 2 (two) times daily. 120 tablet 11  . traMADol (ULTRAM) 50 MG tablet Take 1 tablet (50 mg total) by mouth every 12 (twelve) hours as needed. 30 tablet 0  . Vitamin Hogan, Ergocalciferol, (DRISDOL) 1.25 MG (50000 UT) CAPS capsule TAKE 1 CAPSULE (50,000 UNITS TOTAL) BY MOUTH EVERY 7 (SEVEN) DAYS. 12 capsule 0  . nitroGLYCERIN (NITROSTAT) 0.4 MG SL tablet Place 1 tablet (0.4 mg total) under the tongue  every 5 (five) minutes as needed for chest pain. 90 tablet 3   No current facility-administered medications on file prior to visit.    Review of Systems Constitutional: Negative for other unusual diaphoresis, sweats, appetite or weight changes HENT: Negative for other worsening hearing loss, ear pain, facial swelling, mouth sores or neck stiffness.   Eyes: Negative for other worsening pain, redness or other visual disturbance.  Respiratory: Negative for other stridor or swelling Cardiovascular: Negative for other palpitations or other chest pain  Gastrointestinal: Negative for worsening diarrhea or loose stools, blood in stool, distention or other pain Genitourinary: Negative for hematuria, flank pain or other change in urine volume.  Musculoskeletal: Negative for myalgias or other joint swelling.  Skin: Negative for other color change, or other wound or worsening drainage.  Neurological: Negative for other syncope or numbness. Hematological: Negative for other  adenopathy or swelling Psychiatric/Behavioral: Negative for hallucinations, other worsening agitation, SI, self-injury, or new decreased concentration All other system neg per pt    Objective:   Physical Exam BP 116/78   Pulse 94   Ht 6\' 2"  (1.88 m)   Wt 212 lb (96.2 kg)   SpO2 97%   BMI 27.22 kg/m  VS noted,  Constitutional: Pt is oriented to person, place, and time. Appears well-developed and well-nourished, in no significant distress and comfortable Head: Normocephalic and atraumatic  Eyes: Conjunctivae and EOM are normal. Pupils are equal, round, and reactive to light Right Ear: External ear normal without discharge Left Ear: External ear normal without discharge Nose: Nose without discharge or deformity Mouth/Throat: Oropharynx is without other ulcerations and moist  Neck: Normal range of motion. Neck supple. No JVD present. No tracheal deviation present or significant neck LA or mass Cardiovascular: Normal rate,  regular rhythm, normal heart sounds and intact distal pulses.   Pulmonary/Chest: WOB normal and breath sounds without rales or wheezing  Abdominal: Soft. Bowel sounds are normal. NT. No HSM  Musculoskeletal: Normal range of motion. Exhibits no edema Lymphadenopathy: Has no other cervical adenopathy.  Neurological: Pt is alert and oriented to person, place, and time. Pt has normal reflexes. No cranial nerve deficit. Motor grossly intact, Gait intact Skin: Skin is warm and dry. No rash noted or new ulcerations Psychiatric:  Has normal mood and affect. Behavior is normal without agitation No other exam findings Lab Results  Component Value Date   WBC 7.8 07/03/2018   HGB 16.9 07/03/2018   HCT 50.3 07/03/2018   PLT 226.0 07/03/2018   GLUCOSE 114 (H) 07/03/2018   CHOL 132 07/03/2018   TRIG 188.0 (H) 07/03/2018   HDL 31.20 (L) 07/03/2018   LDLDIRECT 101.0 10/24/2015   LDLCALC 63 07/03/2018   ALT 25 07/03/2018   AST 30 07/03/2018   NA 142 07/03/2018   K 3.4 (L) 07/03/2018   CL 100 07/03/2018   CREATININE 1.40 07/03/2018   BUN 16 07/03/2018   CO2 30 07/03/2018   TSH 1.14 07/03/2018   PSA 0.61 07/01/2017   INR 1.1 (H) 08/01/2016   HGBA1C 5.0 07/04/2016      Assessment & Plan:

## 2018-07-03 NOTE — Progress Notes (Unsigned)
ur

## 2018-07-03 NOTE — Patient Instructions (Signed)

## 2018-07-04 ENCOUNTER — Encounter: Payer: Self-pay | Admitting: Internal Medicine

## 2018-07-04 NOTE — Assessment & Plan Note (Signed)
stable overall by history and exam, recent data reviewed with pt, and pt to continue medical treatment as before,  to f/u any worsening symptoms or concerns  

## 2018-07-04 NOTE — Assessment & Plan Note (Signed)

## 2018-07-20 ENCOUNTER — Other Ambulatory Visit: Payer: Self-pay | Admitting: Family Medicine

## 2018-07-20 NOTE — Telephone Encounter (Signed)
Left message to call back for virtual.

## 2018-07-23 ENCOUNTER — Encounter: Payer: Self-pay | Admitting: Family Medicine

## 2018-07-24 MED ORDER — VITAMIN D (ERGOCALCIFEROL) 1.25 MG (50000 UNIT) PO CAPS: 50000.0000 [IU] | ORAL_CAPSULE | ORAL | 0 refills | Status: DC

## 2018-08-10 ENCOUNTER — Ambulatory Visit (INDEPENDENT_AMBULATORY_CARE_PROVIDER_SITE_OTHER): Payer: Self-pay | Admitting: *Deleted

## 2018-08-10 DIAGNOSIS — I428 Other cardiomyopathies: Secondary | ICD-10-CM

## 2018-08-10 DIAGNOSIS — I5022 Chronic systolic (congestive) heart failure: Secondary | ICD-10-CM

## 2018-08-10 DIAGNOSIS — I429 Cardiomyopathy, unspecified: Secondary | ICD-10-CM

## 2018-08-10 LAB — CUP PACEART REMOTE DEVICE CHECK
Battery Remaining Longevity: 30 mo
Battery Remaining Percentage: 29 %
Battery Voltage: 2.86 V
Brady Statistic RV Percent Paced: 0 %
Date Time Interrogation Session: 20200620092145
HighPow Impedance: 50 Ohm
Implantable Lead Implant Date: 20111010
Implantable Lead Location: 753860
Implantable Lead Model: 7121
Implantable Pulse Generator Implant Date: 20111010
Lead Channel Impedance Value: 930 Ohm
Lead Channel Pacing Threshold Amplitude: 1.25 V
Lead Channel Pacing Threshold Pulse Width: 1 ms
Lead Channel Sensing Intrinsic Amplitude: 11.9 mV
Lead Channel Setting Pacing Amplitude: 4 V
Lead Channel Setting Pacing Pulse Width: 1 ms
Lead Channel Setting Sensing Sensitivity: 0.5 mV
Pulse Gen Serial Number: 615318

## 2018-08-17 ENCOUNTER — Telehealth: Payer: Self-pay | Admitting: Internal Medicine

## 2018-08-17 NOTE — Telephone Encounter (Signed)
Pt came into office and said he was having chest pains and that he took a nitroglycerin, I went and talked with Edd Arbour to be sure what we needed to do and she told me to ask pt if he thought he would be able to make it to the ED or if we needed to call an ambulance because we didn't have any providers available at the moment and with symptoms it would be a safer option to go to ED, I asked pt and he said he was sure he could make it and that we didn't need to call anyone. Per Edd Arbour the closest ED was at [facility] and pt said he knew exactly where that was at. Pt left our office about 15 to 20 minutes ago.

## 2018-08-17 NOTE — Telephone Encounter (Signed)
Attempted to contact pt.  VM states mailbox is full.  Will send questions to pt in MyChart and leave message in triage to follow up.

## 2018-08-18 ENCOUNTER — Telehealth: Payer: Self-pay

## 2018-08-18 NOTE — Telephone Encounter (Signed)
Lpmtcb 6/30.[name]

## 2018-08-18 NOTE — Telephone Encounter (Signed)
Left the pt a message to call the office back and request to speak with any triage nurse to follow-up on mychart message he sent yesterday.

## 2018-08-18 NOTE — Telephone Encounter (Signed)
Spoke with the patient, he stated he feels better today, he just feels a little bit of soreness in his chest. He said his pain had resolved after taking Nitro yesterday. The only major lifestyle change was he lost around 10 pounds. He stated he has hydrated more today and has felt better. He stated the pain was not similar to pain before his heart attack. I offered an appointment with an APP to discuss, however, he stated since he feels better, he would like to wait. I advised to contact the office if he has recurrent symptoms.

## 2018-08-19 ENCOUNTER — Other Ambulatory Visit: Payer: Self-pay

## 2018-08-19 ENCOUNTER — Telehealth: Payer: Self-pay | Admitting: Nurse Practitioner

## 2018-08-19 DIAGNOSIS — Z95810 Presence of automatic (implantable) cardiac defibrillator: Secondary | ICD-10-CM

## 2018-08-19 DIAGNOSIS — I255 Ischemic cardiomyopathy: Secondary | ICD-10-CM

## 2018-08-19 DIAGNOSIS — R5383 Other fatigue: Secondary | ICD-10-CM

## 2018-08-19 NOTE — Telephone Encounter (Addendum)
   Per Dr. [name]: Needs lab work this week  CBC, CMP, TSH, ESR  See if the DOD can see him in person/office this week  Young guy with CHF/AICD and CAD   Called patient and scheduled lab appointment for today, July 1. Patient aware of procedure for getting lab work done at our office.  I have asked him to send a manual transmission from his ICD for evaluation by our device clinic. Patient is aware I will call him back later in regards to an appointment.   COVID-19 Pre-Screening Questions:  . In the past 7 to 10 days have you had a cough,  shortness of breath, headache, congestion, fever (100 or greater) body aches, chills, sore throat, or sudden loss of taste or sense of smell? NO  . Have you been around anyone with known Covid 19. NO . Have you been around anyone who is awaiting Covid 19 test results in the past 7 to 10 days? NO . Have you been around anyone who has been exposed to Covid 19, or has mentioned symptoms of Covid 19 within the past 7 to 10 days? NO  If you have any concerns/questions about symptoms patients report during screening (either on the phone or at threshold). Contact the provider seeing the patient or DOD for further guidance.  If neither are available contact a member of the leadership team.

## 2018-08-19 NOTE — Telephone Encounter (Signed)
Left detailed message for patient regarding his appointment tomorrow with Dr. Angelena Form, DOD. I advised him to call back with questions or concerns.

## 2018-08-19 NOTE — Telephone Encounter (Signed)
ICD transmission received. Normal device function. No episodes since last transmission on 08/08/18. Presenting rhythm Vs @ 83bpm.

## 2018-08-20 ENCOUNTER — Ambulatory Visit: Payer: Self-pay | Admitting: Cardiovascular Disease

## 2018-08-20 NOTE — Progress Notes (Signed)
Remote ICD transmission.   

## 2018-08-20 NOTE — Progress Notes (Deleted)
No chief complaint on file.  History of Present Illness: 43 yo male with history of CAD, ischemic cardiomyopathy, ICD in place, chronic systolic CHF, HLD, HTN who is added onto my schedule today as the DOD for evaluation of recent chest pain. He is followed in our office by Dr. Eden EmmsNishan. He had a large anterior MI in 2011. His RCA is chronically occluded. Cardiac cath September 2019 with severe proximal Circumflex stenosis treated with a drug eluting stent and restenosis proximal LAD stent treated with balloon angioplasty. Echo September 2019 with LVEF=30-35%. ICD in place. He had an ICD shock in September 2018. Chronic right shoulder issues.   He tells me today that he ***  Primary Care Physician: Corwin LevinsJohn, James W, MD Primary Cardiologist: Eden EmmsNishan  Past Medical History:  Diagnosis Date  . Acute Myocardial Infarction 08/18/2009   Qualifier: Diagnosis of  By: Kem ParkinsonBarnes, Kimalexis    . Allergic rhinitis, cause unspecified 10/03/2011  . Anxiety 10/03/2011  . Asthma 09/20/2006   Qualifier: Diagnosis of  By: Jonny RuizJohn MD, Len BlalockJames W   . 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 Qualifier: Diagnosis of  By: Eden EmmsNishan, MD, Harrington ChallengerFACC, Peter Charles   . CAD (coronary artery disease)   . Cholelithiasis 06/22/2007   Qualifier: Diagnosis of  By: Maris BergerSherwood, Elizabeth Ann   . Chronic systolic heart failure (HCC) 09/18/2009   Qualifier: Diagnosis of  By: Kem ParkinsonBarnes, Kimalexis    . Depressive disorder, not elsewhere classified 06/22/2007   Qualifier: Diagnosis of  By: Maris BergerSherwood, Elizabeth Ann   . History of gout   . History of kidney stones   . Hyperlipidemia 81/2011  . Hypertension   . Impaired glucose tolerance 10/05/2012  . Insomnia, unspecified 06/22/2007   Qualifier: Diagnosis of  By: Jonny RuizJohn MD, Len BlalockJames W   . Ischemic cardiomyopathy 10/20/2009   Qualifier: Diagnosis of  By: Graciela HusbandsKlein, MD, [facility] CenterFACC, Ty HiltsSteven Cochran   . 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 Migraine Headache 09/18/2009    Qualifier: Diagnosis of  By: Kem ParkinsonBarnes, Kimalexis    . Renal calculus   . Renal Insufficiency 09/18/2009   Qualifier: Diagnosis of  By: Kem ParkinsonBarnes, Kimalexis    . Respiratory failure (HCC) 08/2009  . Rhabdomyolysis 09/18/2009   Qualifier: Diagnosis of  By: Kem ParkinsonBarnes, Kimalexis      Past Surgical History:  Procedure Laterality Date  . CARDIAC CATHETERIZATION N/A 05/17/2015   Procedure: Left Heart Cath and Coronary Angiography;  Surgeon: Wendall StadePeter C Nishan, MD;  [facility];  Service: Cardiovascular;  Laterality: N/A;  . CARDIAC CATHETERIZATION  2012  . CORONARY ANGIOPLASTY WITH STENT PLACEMENT  2011  . CORONARY STENT INTERVENTION N/A 11/14/2017   Procedure: CORONARY STENT INTERVENTION;  Surgeon: Kathleene HazelMcAlhany, Gwendolyn Mclees D, MD;  Location: [facility] INVASIVE CV LAB;  Service: Cardiovascular;  Laterality: N/A;  . ICD IMPLANT  11/2009   "w/pacing function"  . RIGHT/LEFT HEART CATH AND CORONARY ANGIOGRAPHY N/A 11/14/2017   Procedure: RIGHT/LEFT HEART CATH AND CORONARY ANGIOGRAPHY;  Surgeon: Kathleene HazelMcAlhany, Kaylanni Ezelle D, MD;  Location: [facility] INVASIVE CV LAB;  Service: Cardiovascular;  Laterality: N/A;    Current Outpatient Medications  Medication Sig Dispense Refill  . albuterol (VENTOLIN HFA) 108 (90 Base) MCG/ACT inhaler Inhale 1-2 puffs into the lungs every 6 (six) hours as needed for wheezing or shortness of breath. 8.5 Inhaler 11  . ALPRAZolam (XANAX) 0.5 MG tablet TAKE 1 TABLET (0.5 MG TOTAL) BY MOUTH DAILY AS NEEDED FOR ANXIETY. 30 tablet 2  . anastrozole (ARIMIDEX) 1 MG  tablet Take 1 mg by mouth daily.    [name] aspirin EC 81 MG tablet Take 81 mg by mouth daily.    [name] atorvastatin (LIPITOR) 80 MG tablet Take 1 tablet (80 mg total) by mouth daily. 90 tablet 3  . budesonide-formoterol (SYMBICORT) 80-4.5 MCG/ACT inhaler Inhale 2 puffs into the lungs 2 (two) times daily as needed (wheezing). 1 Inhaler 11  . carvedilol (COREG) 6.25 MG tablet TAKE 1 TABLET BY MOUTH TWICE A DAY 180 tablet 3  . Coenzyme Q10 (CO Q  10) 100 MG CAPS Take 100 mg by mouth daily.     . fluticasone (FLONASE) 50 MCG/ACT nasal spray PLACE 2 SPRAYS INTO BOTH NOSTRILS DAILY AS NEEDED (SEASONAL ALLERGIES). 16 g 3  . KLOR-CON M20 20 MEQ tablet TAKE 2 TABLETS BY MOUTH EVERY DAY 180 tablet 3  . metolazone (ZAROXOLYN) 5 MG tablet TAKE 1 TABLET (5 MG TOTAL) BY MOUTH DAILY AS NEEDED (FOR EDEMA). 30 tablet 9  . mexiletine (MEXITIL) 200 MG capsule TAKE 1 CAPSULE BY MOUTH TWICE A DAY 60 capsule 11  . nitroGLYCERIN (NITROSTAT) 0.4 MG SL tablet Place 1 tablet (0.4 mg total) under the tongue every 5 (five) minutes as needed for chest pain. 90 tablet 3  . prasugrel (EFFIENT) 10 MG TABS tablet Take 1 tablet (10 mg total) by mouth daily. 90 tablet 3  . sacubitril-valsartan (ENTRESTO) 24-26 MG Take 1 tablet by mouth 2 (two) times daily. 180 tablet 3  . spironolactone (ALDACTONE) 25 MG tablet Take 0.5 tablets (12.5 mg total) by mouth daily. 45 tablet 3  . tiZANidine (ZANAFLEX) 4 MG tablet 1 tab by mouth twice per day as needed 60 tablet 2  . torsemide (DEMADEX) 20 MG tablet Take 2 tablets (40 mg total) by mouth 2 (two) times daily. 120 tablet 11  . traMADol (ULTRAM) 50 MG tablet Take 1 tablet (50 mg total) by mouth every 12 (twelve) hours as needed. 30 tablet 0  . Vitamin D, Ergocalciferol, (DRISDOL) 1.25 MG (50000 UT) CAPS capsule Take 1 capsule (50,000 Units total) by mouth every 7 (seven) days. 12 capsule 0   No current facility-administered medications for this visit.     Allergies  Allergen Reactions  . Levaquin [Levofloxacin] Other (See Comments)    Joint pain  . Ace Inhibitors Cough  . Crestor [Rosuvastatin] Other (See Comments)    "Sweet cravings"  . Lunesta [Eszopiclone] Other (See Comments)    "Bitter taste in mouth"  . Zocor [Simvastatin] Other (See Comments)    Memory problem    Social History   Socioeconomic History  . Marital status: Married    Spouse name: Not on file  . Number of children: 0  . Years of education: Not  on file  . Highest education level: Not on file  Occupational History  . 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 Financial resource strain: Not on file  . Food insecurity    Worry: Not on file    Inability: Not on file  . Transportation needs    Medical: Not on file    Non-medical: Not on file  Tobacco Use  . Smoking status: Former Smoker    Packs/day: 0.10    Years: 2.00    Pack years: 0.20    Types: Cigarettes    Quit date: 02/18/1994    Years since quitting: 24.5  . Smokeless tobacco: Never Used  Substance and Sexual Activity  . Alcohol use: Yes  Comment: 11/25/2017 "couple beers/month; if that"  . Drug use: Never  . Sexual activity: Yes  Lifestyle  . Physical activity    Days per week: Not on file    Minutes per session: Not on file  . Stress: Not on file  Relationships  . Social Herbalist on phone: Not on file    Gets together: Not on file    Attends religious service: Not on file    Active member of club or organization: Not on file    Attends meetings of clubs or organizations: Not on file    Relationship status: Not on file  . Intimate partner violence    Fear of current or ex partner: Not on file    Emotionally abused: Not on file    Physically abused: Not on file    Forced sexual activity: Not on file  Other Topics Concern  . Not on file  Social History Narrative  . Not on file    Family History  Problem Relation Age of Onset  . Heart attack Father   . Heart disease Other   . Diabetes Other   . Hypothyroidism Other     Review of Systems:  As stated in the HPI and otherwise negative.   There were no vitals taken for this visit.  Physical Examination: General: Well developed, well nourished, NAD  HEENT: OP clear, mucus membranes moist  SKIN: warm, dry. No rashes. Neuro: No focal deficits  Musculoskeletal: Muscle strength 5/5 all ext  Psychiatric: Mood and affect normal  Neck: No JVD, no carotid bruits, no  thyromegaly, no lymphadenopathy.  Lungs:Clear bilaterally, no wheezes, rhonci, crackles Cardiovascular: Regular rate and rhythm. No murmurs, gallops or rubs. Abdomen:Soft. Bowel sounds present. Non-tender.  Extremities: No lower extremity edema. Pulses are 2 + in the bilateral DP/PT.  EKG:  EKG {ACTION; IS/IS GUR:42706237} ordered today. The ekg ordered today demonstrates ***  Recent Labs: 10/02/2017: Magnesium 2.0 07/03/2018: ALT 25; BUN 16; Creatinine, Ser 1.40; Hemoglobin 16.9; Platelets 226.0; Potassium 3.4; Sodium 142; TSH 1.14   Lipid Panel    Component Value Date/Time   CHOL 132 07/03/2018 1515   CHOL 104 01/05/2018 0920   TRIG 188.0 (H) 07/03/2018 1515   HDL 31.20 (L) 07/03/2018 1515   HDL 30 (L) 01/05/2018 0920   CHOLHDL 4 07/03/2018 1515   VLDL 37.6 07/03/2018 1515   LDLCALC 63 07/03/2018 1515   LDLCALC 61 01/05/2018 0920   LDLDIRECT 101.0 10/24/2015 1554     Wt Readings from Last 3 Encounters:  07/03/18 212 lb (96.2 kg)  04/22/18 212 lb (96.2 kg)  03/20/18 216 lb 9.6 oz (98.2 kg)     Other studies Reviewed: Additional studies/ records that were reviewed today include: ***. Review of the above records demonstrates: ***   Assessment and Plan:   1. CAD: ***  2. Ischemic cardiomyopathy/Chronic systolic CHF: ***  Current medicines are reviewed at length with the patient today.  The patient {ACTIONS; HAS/DOES NOT HAVE:19233} concerns regarding medicines.  The following changes have been made:  {PLAN; NO CHANGE:13088:s}  Labs/ tests ordered today include: *** No orders of the defined types were placed in this encounter.    Disposition:   FU with *** in {gen number 6-28:315176} {TIME; UNITS DAY/WEEK/MONTH:19136}   Signed, Lauree Chandler, MD 08/20/2018 7:46 AM    [facility] [place], [place], [place]  [address] Phone: [phone]; Fax: [phone]

## 2018-08-24 ENCOUNTER — Encounter: Payer: Self-pay | Admitting: Physician Assistant

## 2018-08-24 ENCOUNTER — Telehealth: Payer: Self-pay

## 2018-08-24 LAB — HEPATIC FUNCTION PANEL
ALT: 42 IU/L (ref 0–44)
AST: 34 IU/L (ref 0–40)
Albumin: 5.1 g/dL — ABNORMAL HIGH (ref 4.0–5.0)
Alkaline Phosphatase: 64 IU/L (ref 39–117)
Bilirubin Total: 0.3 mg/dL (ref 0.0–1.2)
Bilirubin, Direct: 0.12 mg/dL (ref 0.00–0.40)
Total Protein: 7.5 g/dL (ref 6.0–8.5)

## 2018-08-24 LAB — BASIC METABOLIC PANEL
BUN/Creatinine Ratio: 11 (ref 9–20)
BUN: 15 mg/dL (ref 6–24)
CO2: 28 mmol/L (ref 20–29)
Calcium: 10.4 mg/dL — ABNORMAL HIGH (ref 8.7–10.2)
Chloride: 98 mmol/L (ref 96–106)
Creatinine, Ser: 1.33 mg/dL — ABNORMAL HIGH (ref 0.76–1.27)
GFR calc Af Amer: 76 mL/min/{1.73_m2} (ref >59)
GFR calc non Af Amer: 65 mL/min/{1.73_m2} (ref >59)
Glucose: 79 mg/dL (ref 65–99)
Potassium: 4.2 mmol/L (ref 3.5–5.2)
Sodium: 141 mmol/L (ref 134–144)

## 2018-08-24 LAB — CBC
Hematocrit: 46.3 % (ref 37.5–51.0)
Hemoglobin: 16 g/dL (ref 13.0–17.7)
MCH: 28.2 pg (ref 26.6–33.0)
MCHC: 34.6 g/dL (ref 31.5–35.7)
MCV: 82 fL (ref 79–97)
Platelets: 219 10*3/uL (ref 150–450)
RBC: 5.67 x10E6/uL (ref 4.14–5.80)
RDW: 13.1 % (ref 11.6–15.4)
WBC: 7.2 10*3/uL (ref 3.4–10.8)

## 2018-08-24 LAB — TSH: TSH: 1.64 u[IU]/mL (ref 0.450–4.500)

## 2018-08-24 LAB — SEDIMENTATION RATE: Sed Rate: 33 mm/hr — ABNORMAL HIGH (ref 0–15)
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
## 2018-08-24 NOTE — Telephone Encounter (Signed)
Pt sent a my chart msg re: left shoulder pain with exertion and pain around his ICD... he says that he has been having dizziness and episodes of "tunnel' vision... he says he has been very anxious about it and it may be anxiety. He feels he cannot take a deep breath. He denies peripheral edema.... says he held his diuretic this weekend due to feeling dizzy and thought he could be a little dehydrated but it did not seem to help. Pt denies chest pain but the Left shoulder pain does not happen only  with movement and he is concerned this could be cardiac since he had it prior to his stent in the Fall 2019.   Pt sent transmission for ICD pain 08/19/18 and it appeared WNL.   He says that he has had a previous conversation with Dr. [name] and plan was for him to call and be seen when symptoms such as his present.. Dr. [name] is not in the office this week.. I offered the pt an appt 08/25/18 with Melina Copa PA.[name]   Pt says he has not been around anyone sick... but was out of town in [place] early June and some of the people there were having concerning symptoms such as "tonsilitis" so when he returned home 08/03/18 he had a COVID test done at [facility] which was negative... he has not been around anyone sick since and he wears a mask in public.       COVID-19 Pre-Screening Questions:  . In the past 7 to 10 days have you had a cough,  shortness of breath, headache, congestion, fever (100 or greater) body aches, chills, sore throat, or sudden loss of taste or sense of smell? NO . Have you been around anyone with known Covid 19. NO . Have you been around anyone who is awaiting Covid 19 test results in the past 7 to 10 days? NO . Have you been around anyone who has been exposed to Covid 19, or has mentioned symptoms of Covid 19 within the past 7 to 10 days? NO   If you have any concerns/questions about symptoms patients report during screening (either on the phone or at threshold). Contact the provider seeing the patient or  DOD for further guidance.  If neither are available contact a member of the leadership team.

## 2018-08-24 NOTE — Telephone Encounter (Signed)
LM for pt to call re: his My Chart message this morning.. re: chest pain and wanting an appt to be seen this week.

## 2018-08-25 ENCOUNTER — Encounter: Payer: Self-pay | Admitting: Physician Assistant

## 2018-08-25 ENCOUNTER — Ambulatory Visit (HOSPITAL_COMMUNITY): Admit: 2018-08-25 | Payer: Self-pay | Admitting: Cardiology

## 2018-08-25 ENCOUNTER — Ambulatory Visit (INDEPENDENT_AMBULATORY_CARE_PROVIDER_SITE_OTHER): Payer: Self-pay | Admitting: *Deleted

## 2018-08-25 ENCOUNTER — Ambulatory Visit (INDEPENDENT_AMBULATORY_CARE_PROVIDER_SITE_OTHER): Payer: Self-pay | Admitting: Physician Assistant

## 2018-08-25 ENCOUNTER — Other Ambulatory Visit: Payer: Self-pay

## 2018-08-25 ENCOUNTER — Encounter (HOSPITAL_COMMUNITY): Payer: Self-pay

## 2018-08-25 VITALS — BP 112/80 | HR 77 | Ht 74.0 in | Wt 209.4 lb

## 2018-08-25 DIAGNOSIS — R42 Dizziness and giddiness: Secondary | ICD-10-CM

## 2018-08-25 DIAGNOSIS — R0789 Other chest pain: Secondary | ICD-10-CM

## 2018-08-25 DIAGNOSIS — I5022 Chronic systolic (congestive) heart failure: Secondary | ICD-10-CM

## 2018-08-25 DIAGNOSIS — R0602 Shortness of breath: Secondary | ICD-10-CM

## 2018-08-25 DIAGNOSIS — I10 Essential (primary) hypertension: Secondary | ICD-10-CM

## 2018-08-25 DIAGNOSIS — I255 Ischemic cardiomyopathy: Secondary | ICD-10-CM

## 2018-08-25 DIAGNOSIS — I2510 Atherosclerotic heart disease of native coronary artery without angina pectoris: Secondary | ICD-10-CM

## 2018-08-25 LAB — CUP PACEART INCLINIC DEVICE CHECK
Battery Remaining Longevity: 32 mo
Brady Statistic RV Percent Paced: 0 %
Date Time Interrogation Session: 20200707124659
HighPow Impedance: 52.29 Ohm
Implantable Lead Implant Date: 20111010
Implantable Lead Location: 753860
Implantable Lead Model: 7121
Implantable Pulse Generator Implant Date: 20111010
Lead Channel Impedance Value: 937.5 Ohm
Lead Channel Pacing Threshold Amplitude: 2 V
Lead Channel Pacing Threshold Pulse Width: 1 ms
Lead Channel Sensing Intrinsic Amplitude: 11.9 mV
Lead Channel Setting Pacing Amplitude: 4 V
Lead Channel Setting Pacing Pulse Width: 1 ms
Lead Channel Setting Sensing Sensitivity: 0.5 mV
Pulse Gen Serial Number: 615318

## 2018-08-25 SURGERY — RIGHT/LEFT HEART CATH AND CORONARY ANGIOGRAPHY
Anesthesia: LOCAL

## 2018-08-25 NOTE — Progress Notes (Signed)
ICD check in clinic. Normal device function. Thresholds and sensing consistent with previous device measurements. Impedance trends stable over time. No evidence of any ventricular arrhythmias. Histogram distribution appropriate for patient and level of activity. Alert for 2 magnet contacts, per industry events were in February 2020 and were transient in nature .  No changes made this session. Device programmed at appropriate safety margins. Device programmed to optimize intrinsic conduction. Estimated longevity 2 yrs 8 mos. Pt enrolled in remote follow-up. Plan to check device remotely 11/10/18 and in office with Dr [name] 02/2019. Patient education completed including shock plan.

## 2018-08-25 NOTE — Patient Instructions (Addendum)
Medication Instructions:  Your physician recommends that you continue on your current medications as directed. Please refer to the Current Medication list given to you today.  If you need a refill on your cardiac medications before your next appointment, please call your pharmacy.   Lab work: None ordered  If you have labs (blood work) drawn today and your tests are completely normal, you will receive your results only by: [name] MyChart Message (if you have MyChart) OR . A paper copy in the mail If you have any lab test that is abnormal or we need to change your treatment, we will call you to review the results.  Testing/Procedures: None ordered  You have been referred to Heart Failure Clinic. They will call you with an appointment.    Follow-Up: At [facility], you and your health needs are our priority.  As part of our continuing mission to provide you with exceptional heart care, we have created designated Provider Care Teams.  These Care Teams include your primary Cardiologist (physician) and Advanced Practice Providers (APPs -  Physician Assistants and Nurse Practitioners) who all work together to provide you with the care you need, when you need it. You will need a follow up appointment 1ST AVAILABLE WITH DR. [name].  VIRTUAL WILL BE OK PER DAYNA DUNN, PA-C  Any Other Special Instructions Will Be Listed Below (If Applicable).

## 2018-08-31 ENCOUNTER — Telehealth: Payer: Self-pay | Admitting: Physician Assistant

## 2018-08-31 DIAGNOSIS — I255 Ischemic cardiomyopathy: Secondary | ICD-10-CM

## 2018-08-31 NOTE — Telephone Encounter (Signed)
   I discussed pt's case with Dr. Haroldine Laws - at recent [place], recommended referral to advanced HF clinic. Dr. Haroldine Laws requested that we make sure the pt's initial visit is with him specifically. Can you convey this to HF clinic team in referral process? Thanks. Dayna Dunn PA-C

## 2018-09-06 ENCOUNTER — Other Ambulatory Visit: Payer: Self-pay

## 2018-09-06 ENCOUNTER — Encounter (HOSPITAL_COMMUNITY): Payer: Self-pay | Admitting: Emergency Medicine

## 2018-09-06 ENCOUNTER — Emergency Department (HOSPITAL_COMMUNITY)
Admission: EM | Admit: 2018-09-06 | Discharge: 2018-09-06 | Disposition: A | Discharge status: Home / Self Care | Payer: Self-pay | Attending: Emergency Medicine | Admitting: Emergency Medicine

## 2018-09-06 ENCOUNTER — Emergency Department (HOSPITAL_COMMUNITY): Payer: Self-pay

## 2018-09-06 DIAGNOSIS — R110 Nausea: Secondary | ICD-10-CM | POA: Insufficient documentation

## 2018-09-06 DIAGNOSIS — Z87891 Personal history of nicotine dependence: Secondary | ICD-10-CM | POA: Insufficient documentation

## 2018-09-06 DIAGNOSIS — R002 Palpitations: Secondary | ICD-10-CM

## 2018-09-06 DIAGNOSIS — R079 Chest pain, unspecified: Secondary | ICD-10-CM | POA: Insufficient documentation

## 2018-09-06 DIAGNOSIS — R5383 Other fatigue: Secondary | ICD-10-CM | POA: Insufficient documentation

## 2018-09-06 DIAGNOSIS — I2510 Atherosclerotic heart disease of native coronary artery without angina pectoris: Secondary | ICD-10-CM | POA: Insufficient documentation

## 2018-09-06 DIAGNOSIS — R42 Dizziness and giddiness: Secondary | ICD-10-CM | POA: Insufficient documentation

## 2018-09-06 DIAGNOSIS — I110 Hypertensive heart disease with heart failure: Secondary | ICD-10-CM | POA: Insufficient documentation

## 2018-09-06 DIAGNOSIS — Z7982 Long term (current) use of aspirin: Secondary | ICD-10-CM | POA: Insufficient documentation

## 2018-09-06 DIAGNOSIS — J45909 Unspecified asthma, uncomplicated: Secondary | ICD-10-CM | POA: Insufficient documentation

## 2018-09-06 DIAGNOSIS — Z955 Presence of coronary angioplasty implant and graft: Secondary | ICD-10-CM | POA: Insufficient documentation

## 2018-09-06 DIAGNOSIS — Z95810 Presence of automatic (implantable) cardiac defibrillator: Secondary | ICD-10-CM | POA: Insufficient documentation

## 2018-09-06 DIAGNOSIS — Z20828 Contact with and (suspected) exposure to other viral communicable diseases: Secondary | ICD-10-CM | POA: Insufficient documentation

## 2018-09-06 DIAGNOSIS — Z79899 Other long term (current) drug therapy: Secondary | ICD-10-CM | POA: Insufficient documentation

## 2018-09-06 DIAGNOSIS — I5022 Chronic systolic (congestive) heart failure: Secondary | ICD-10-CM | POA: Insufficient documentation

## 2018-09-06 LAB — BASIC METABOLIC PANEL
Anion gap: 10 (ref 5–15)
BUN: 9 mg/dL (ref 6–20)
CO2: 25 mmol/L (ref 22–32)
Calcium: 9.7 mg/dL (ref 8.9–10.3)
Chloride: 104 mmol/L (ref 98–111)
Creatinine, Ser: 1.09 mg/dL (ref 0.61–1.24)
GFR calc Af Amer: >60 mL/min (ref >60)
GFR calc non Af Amer: >60 mL/min (ref >60)
Glucose, Bld: 101 mg/dL — ABNORMAL HIGH (ref 70–99)
Potassium: 4 mmol/L (ref 3.5–5.1)
Sodium: 139 mmol/L (ref 135–145)

## 2018-09-06 LAB — CBC WITH DIFFERENTIAL/PLATELET
Abs Immature Granulocytes: 0.02 10*3/uL (ref 0.00–0.07)
Basophils Absolute: 0 10*3/uL (ref 0.0–0.1)
Basophils Relative: 1 %
Eosinophils Absolute: 0.2 10*3/uL (ref 0.0–0.5)
Eosinophils Relative: 3 %
HCT: 49.7 % (ref 39.0–52.0)
Hemoglobin: 15.9 g/dL (ref 13.0–17.0)
Immature Granulocytes: 0 %
Lymphocytes Relative: 28 %
Lymphs Abs: 1.8 10*3/uL (ref 0.7–4.0)
MCH: 27 pg (ref 26.0–34.0)
MCHC: 32 g/dL (ref 30.0–36.0)
MCV: 84.4 fL (ref 80.0–100.0)
Monocytes Absolute: 0.6 10*3/uL (ref 0.1–1.0)
Monocytes Relative: 9 %
Neutro Abs: 3.7 10*3/uL (ref 1.7–7.7)
Neutrophils Relative %: 59 %
Platelets: 194 10*3/uL (ref 150–400)
RBC: 5.89 MIL/uL — ABNORMAL HIGH (ref 4.22–5.81)
RDW: 13.4 % (ref 11.5–15.5)
WBC: 6.4 10*3/uL (ref 4.0–10.5)
nRBC: 0 % (ref 0.0–0.2)

## 2018-09-06 LAB — MAGNESIUM: Magnesium: 1.9 mg/dL (ref 1.7–2.4)

## 2018-09-06 LAB — TROPONIN I (HIGH SENSITIVITY)
Troponin I (High Sensitivity): 9 ng/L (ref <18)
Troponin I (High Sensitivity): 8 ng/L (ref <18)

## 2018-09-06 LAB — SEDIMENTATION RATE: Sed Rate: 2 mm/hr (ref 0–16)

## 2018-09-06 LAB — BRAIN NATRIURETIC PEPTIDE: B Natriuretic Peptide: 54.6 pg/mL (ref 0.0–100.0)

## 2018-09-06 LAB — SARS CORONAVIRUS 2 BY RT PCR (HOSPITAL ORDER, PERFORMED IN ~~LOC~~ HOSPITAL LAB): SARS Coronavirus 2: NEGATIVE

## 2018-09-06 NOTE — ED Triage Notes (Signed)
Pt arrives from home. Pt complaining of chest pressure for 3 weeks. Pt states he was seen by hs cardiologist and everything came back ok. Pt states he has been having pain around his ICD and chest pressure. Pt states he has been experiencing some anxiety.

## 2018-09-06 NOTE — ED Triage Notes (Signed)
Pt is cardiac pt at [facility], has been feeling an anxious feeling in his chest, has felt like his defib has been firing, has noted his heart rate has spiked up intermittently.

## 2018-09-06 NOTE — Consult Note (Signed)
CARDIOLOGY CONSULT NOTE       Patient ID: Jason Hogan MRN: [id number] DOB/AGE: 08-12-1975 43 y.o.  Admit date: 09/06/2018 Referring Physician: Melina Hogan [facility] Primary Physician: Jason Borg, MD Primary Cardiologist: Jason Hogan Reason for Consultation: Jason Hogan  Active Problems:   * No active hospital problems. *   HPI:  43 y.o. well known to me since 2011 when he had anterior MI with DES to LAD and occluded/collateralized distal RCA. Subsequent [employer] Jude AICD with EF 30-35%. Some VT controlled with mexiletine On appropriate CHF Rx compliant with no volume overload. Has had rheumatologic evaluation for elevated sed rate and Rx with prednisone multiple times in past No diagnosis. Some anxiety/depression as well. Has tested negative for COVID. Lots of constitutional symptoms. Malaise, nausea, poor appetite joint pains Palpitations. Reviewed interrogation of device 08/25/18 and merlin transmission from today and normal device function with no arrhythmias Feels tingling and pain under AICD No real chest pain or dyspnea. In ER looks fine. Hemodynamics normal with no arrhythmia. Labs ok and troponin negative x 1. ESR chronically elevated in the 48-33 range pending in ER. CXR NAD   ROS All other systems reviewed and negative except as noted above  Past Medical History:  Diagnosis Date  . Acute Myocardial Infarction 08/18/2009   Qualifier: Diagnosis of  By: Jason Hogan    . Allergic rhinitis, cause unspecified 10/03/2011  . Anxiety 10/03/2011  . Asthma 09/20/2006   Qualifier: Diagnosis of  By: Jason Reichmann MD, Jason Hogan   . 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 Qualifier: Diagnosis of  By: Jason Cancel, MD, Jason Hogan   . CAD (coronary artery disease)    a. large anterior MI 2011 s/p PTCA/DES to 100%. b. occluded LAD with occluded collateralized silent RCA, balloon angioplasty to stented segment of LAD and DES x1 to mid circumflex in 10/2017.   [name] Cholelithiasis 06/22/2007   Qualifier: Diagnosis of  By: Jason Hogan   . Chronic systolic heart failure ([place]) 09/18/2009   Qualifier: Diagnosis of  By: Jason Hogan    . Depressive disorder, not elsewhere classified 06/22/2007   Qualifier: Diagnosis of  By: Jason Hogan   . History of gout   . History of kidney stones   . Hyperlipidemia 81/2011  . Hypertension   . Impaired glucose tolerance 10/05/2012  . Insomnia, unspecified 06/22/2007   Qualifier: Diagnosis of  By: Jason Reichmann MD, Jason Hogan   . Ischemic cardiomyopathy 10/20/2009   Qualifier: Diagnosis of  By: Jason Comes, MD, [facility], Jason Hogan   . 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 Migraine Headache 09/18/2009   Qualifier: Diagnosis of  By: Jason Hogan    . Renal calculus   . Renal Insufficiency 09/18/2009   Qualifier: Diagnosis of  By: Jason Hogan    . Respiratory failure ([place]) 08/2009  . Rhabdomyolysis 09/18/2009   Qualifier: Diagnosis of  By: Jason Hogan    . VT (ventricular tachycardia) (HCC)     Family History  Problem Relation Age of Onset  . Heart attack Father   . Heart disease Other   . Diabetes Other   . Hypothyroidism Other     Social History   Socioeconomic History  . Marital status: Married    Spouse name: Not on file  . Number of children: 0  . Years of education: Not on file  . Highest education level: Not on file  Occupational History  . 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 Financial resource strain: Not on file  . Food insecurity    Worry: Not on file    Inability: Not on file  . Transportation needs    Medical: Not on file    Non-medical: Not on file  Tobacco Use  . Smoking status: Former Smoker    Packs/day: 0.10    Years: 2.00    Pack years: 0.20    Types: Cigarettes    Quit date: 02/18/1994    Years since quitting: 24.5  . Smokeless tobacco: Never Used  Substance and Sexual Activity  . Alcohol use: Yes    Comment: 11/25/2017  "couple beers/month; if that"  . Drug use: Never  . Sexual activity: Yes  Lifestyle  . Physical activity    Days per week: Not on file    Minutes per session: Not on file  . Stress: Not on file  Relationships  . Social Herbalist on phone: Not on file    Gets together: Not on file    Attends religious service: Not on file    Active member of club or organization: Not on file    Attends meetings of clubs or organizations: Not on file    Relationship status: Not on file  . Intimate partner violence    Fear of current or ex partner: Not on file    Emotionally abused: Not on file    Physically abused: Not on file    Forced sexual activity: Not on file  Other Topics Concern  . Not on file  Social History Narrative  . Not on file    Past Surgical History:  Procedure Laterality Date  . CARDIAC CATHETERIZATION N/A 05/17/2015   Procedure: Left Heart Cath and Coronary Angiography;  Surgeon: Jason Hector, MD;  Location: [facility];  Service: Cardiovascular;  Laterality: N/A;  . CARDIAC CATHETERIZATION  2012  . CORONARY ANGIOPLASTY WITH STENT PLACEMENT  2011  . CORONARY STENT INTERVENTION N/A 11/14/2017   Procedure: CORONARY STENT INTERVENTION;  Surgeon: Jason Blanks, MD;  Location: [facility];  Service: Cardiovascular;  Laterality: N/A;  . ICD IMPLANT  11/2009   "w/pacing function"  . RIGHT/LEFT HEART CATH AND CORONARY ANGIOGRAPHY N/A 11/14/2017   Procedure: RIGHT/LEFT HEART CATH AND CORONARY ANGIOGRAPHY;  Surgeon: Jason Blanks, MD;  Location: [facility];  Service: Cardiovascular;  Laterality: N/A;        Physical Exam: Blood pressure 128/88, pulse 75, temperature 98.5 F (36.9 C), temperature source Oral, resp. rate 20, height 6' 2"  (1.88 m), weight 92.1 kg, SpO2 98 %.   Affect appropriate Healthy:  appears stated age 43: normal Neck supple with no adenopathy JVP normal no bruits no thyromegaly Lungs clear with no  wheezing and good diaphragmatic motion Heart:  S1/S2 no murmur, no rub, gallop or click PMI normal Defibrillator normal no fluctuance  Abdomen: benighn, BS positve, no tenderness, no AAA no bruit.  No HSM or HJR Distal pulses intact with no bruits No edema Neuro non-focal Skin warm and dry No muscular weakness   Labs:   Lab Results  Component Value Date   WBC 6.4 09/06/2018   HGB 15.9 09/06/2018   HCT 49.7 09/06/2018   MCV 84.4 09/06/2018   PLT 194 09/06/2018    Recent Labs  Lab 09/06/18 1124  NA 139  K 4.0  CL 104  CO2 25  BUN 9  CREATININE 1.09  CALCIUM 9.7  GLUCOSE  101*   Lab Results  Component Value Date   CKTOTAL 319 (H) 07/01/2017   CKMB (HH) 08/27/2009    6.1 CRITICAL RESULT CALLED TO, READ BACK BY AND VERIFIED WITH: RICE C,RN 1121 08/27/09 SCALES H   TROPONINI <0.03 11/26/2017    Lab Results  Component Value Date   CHOL 132 07/03/2018   CHOL 104 01/05/2018   CHOL 149 07/01/2017   Lab Results  Component Value Date   HDL 31.20 (L) 07/03/2018   HDL 30 (L) 01/05/2018   HDL 28.40 (L) 07/01/2017   Lab Results  Component Value Date   LDLCALC 63 07/03/2018   LDLCALC 61 01/05/2018   LDLCALC 101 (H) 07/01/2017   Lab Results  Component Value Date   TRIG 188.0 (H) 07/03/2018   TRIG 63 01/05/2018   TRIG 101.0 07/01/2017   Lab Results  Component Value Date   CHOLHDL 4 07/03/2018   CHOLHDL 3.5 01/05/2018   CHOLHDL 5 07/01/2017   Lab Results  Component Value Date   LDLDIRECT 101.0 10/24/2015   LDLDIRECT 102.3 10/05/2012   LDLDIRECT 95.2 10/02/2011      Radiology: Dg Chest Port 1 View  Result Date: 09/06/2018 CLINICAL DATA:  Chest pain. EXAM: PORTABLE CHEST 1 VIEW COMPARISON:  Radiographs of November 25, 2017 FINDINGS: The heart size and mediastinal contours are within normal limits. Both lungs are clear. Left-sided pacemaker is unchanged in position. No pneumothorax or pleural effusion is noted. The visualized skeletal structures are unremarkable.  IMPRESSION: No active disease. Electronically Signed   By: Marijo Conception M.D.   On: 09/06/2018 12:29    EKG: NSR poor R wave progression old anterior MI no acute changes   ASSESSMENT AND PLAN:   1. Palpitations: benign no arrhythmia on telemetry Merlin device interrogation normal 2.  CAD:  2011 anterior MI DES known distal RCA occlusion with collaterals  Last intervention 11/14/17 DES to proximal circumflex and PCI ostial LAD.  R/O no ECG changes continue medical Rx 3. CHF:  euvolemic continue current Rx 4. Contitutional:  Malaise, joint pain, nasuea, poor appetite WBC normal ESR pending f/u with [name] Likely ok to d/c if ESR not markedly elevated Not clear why he is on arimidex 5. VT: stable no arrhythmia despite sensation of palpitations continue beta blocker and mexiletine 6. 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

## 2018-09-06 NOTE — Discharge Instructions (Addendum)
You were seen in the emergency department for intermittent chest discomfort shortness of breath and feeling lightheaded.  You had blood work and an EKG along with an interrogation of your AICD.  You also had a cardiology consult which did not feel this was related to your heart.  Please continue your regular medications and follow-up with your primary care doctor and cardiology.  Return if any concerns.

## 2018-09-06 NOTE — ED Notes (Signed)
Dr. [name] notified about pt pacemaker interrogation.

## 2018-09-06 NOTE — ED Notes (Signed)
St Jude alerted this RN nothing was noted upon interrogation of pacemaker.

## 2018-09-06 NOTE — ED Provider Notes (Signed)
[facility] EMERGENCY DEPARTMENT Provider Note   CSN: [id number] Arrival date & time: 09/06/18  1108     History   Chief Complaint Chief Complaint  Patient presents with   Chest Pain    HPI Jason Hogan is a 43 y.o. male.  He has significant cardiac disease with a history of an MI, and repeat angioplasty, AICD for cardiomyopathy.  He said he has been on and off with chest pressure and feeling lightheaded for the past 3 weeks.  He says his watch will say his heart rate is jumping up into the high 100s at times.  He is felt some twitching sensation in his chest and he is not sure if it is muscular or related to his AICD.  He does not think his AICD has gone off recently but he is not sure.  No fevers or cough.  He saw the cardiology office a few weeks ago and they set him up for an outpatient appointment.  He is also seen his PCP and been put on some medicine for anxiety which she does not use very frequently.     The history is provided by the patient.  Chest Pain Pain location:  L chest Pain quality: pressure and throbbing   Pain radiates to:  Does not radiate Pain severity:  Mild Onset quality:  Gradual Timing:  Intermittent Progression:  Unchanged Chronicity:  New Relieved by:  Nothing Worsened by:  Nothing Ineffective treatments:  None tried Associated symptoms: AICD problem, dizziness, fatigue and nausea   Associated symptoms: no abdominal pain, no back pain, no cough, no fever, no numbness, no orthopnea, no shortness of breath and no syncope   Risk factors: coronary artery disease, high cholesterol and hypertension     Past Medical History:  Diagnosis Date   Acute Myocardial Infarction 08/18/2009   Qualifier: Diagnosis of  By: Drema Dallas, Kimalexis     Allergic rhinitis, cause unspecified 10/03/2011   Anxiety 10/03/2011   Asthma 09/20/2006   Qualifier: Diagnosis of  By: [name], [name]    Automatic implantable cardiac defibrillator in situ  01/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 Qualifier: Diagnosis of  By: [name], MD, [name]    CAD (coronary artery disease)    a. large anterior MI 2011 s/p PTCA/DES to 100%. b. occluded LAD with occluded collateralized silent RCA, balloon angioplasty to stented segment of LAD and DES x1 to mid circumflex in 10/2017.   Cholelithiasis 06/22/2007   Qualifier: Diagnosis of  By: [name]    Chronic systolic heart failure ([place]) 09/18/2009   Qualifier: Diagnosis of  By: [name]     Depressive disorder, not elsewhere classified 06/22/2007   Qualifier: Diagnosis of  By: [name]    History of gout    History of kidney stones    Hyperlipidemia 81/2011   Hypertension    Impaired glucose tolerance 10/05/2012   Insomnia, unspecified 06/22/2007   Qualifier: Diagnosis of  By: [name], [name]    Ischemic cardiomyopathy 10/20/2009   Qualifier: Diagnosis of  By: [name], MD, Remus Blake    Low testosterone 10/14/2013   Migraine Headache 09/18/2009   Qualifier: Diagnosis of  By: [name]     Renal calculus    Renal Insufficiency 09/18/2009   Qualifier: Diagnosis of  By: [name]     Respiratory failure ([place]) 08/2009   Rhabdomyolysis 09/18/2009   Qualifier: Diagnosis of  By: [name]  VT (ventricular tachycardia) [facility](HCC)     Patient Active Problem List   Diagnosis Date Noted   Left wrist fracture, closed, initial encounter 01/01/2018   Ischemic cardiomyopathy s/p AICD 11/25/2017   Hypertension 11/25/2017   CAD (coronary artery disease), native coronary artery 11/15/2017   Unstable angina (HCC)    Labral tear of shoulder, right, initial encounter 03/04/2017   Acute bursitis of right shoulder 02/04/2017   Elevated CPK 01/01/2017   Elevated sed rate 01/01/2017   Hematuria 07/17/2016   Feels feverish 07/04/2016   Chest pain 07/04/2016   Dyspnea 07/04/2016   Rib pain on right  side 07/04/2016   Abnormal urine 07/04/2016   Low back pain 07/04/2016   Muscle cramps 07/04/2016   Strain of groin, left, initial encounter 06/19/2016   Capsulitis of toe of right foot 03/08/2015   Cervical radiculopathy at C8 03/08/2015   Right shoulder pain 12/15/2014   Polyarthralgia 11/17/2014   Pes anserine bursitis 11/17/2014   Epistaxis, recurrent 08/18/2014   Allergic rhinitis 08/18/2014   Shoulder bursitis 08/01/2014   Tachycardia 04/19/2014   Acute respiratory infection 04/19/2014   Gout 12/06/2013   Insomnia 10/14/2013   Low testosterone 10/14/2013   Cough 06/14/2013   Impaired glucose tolerance 10/05/2012   Anxiety 10/03/2011   Sinusitis, acute 10/03/2011   Preventative health care 10/02/2011   Implantable cardioverter-defibrillator (ICD) in situ 01/17/2010   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/20/2009   HYPERCHOLESTEROLEMIA 09/18/2009   MIGRAINE HEADACHE 09/18/2009   MYOCARDIAL INFARCTION 09/18/2009   Chronic systolic heart failure (HCC) 09/18/2009   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/18/2009   Rhabdomyolysis 09/18/2009   ELEVATED BLOOD PRESSURE WITHOUT DIAGNOSIS OF HYPERTENSION 12/19/2008   Hyperlipidemia 06/22/2007   DEPRESSION 06/22/2007   Sleep disorder 06/22/2007   Asthma 09/20/2006   RENAL CALCULUS, HX OF 09/20/2006    Past Surgical History:  Procedure Laterality Date   CARDIAC CATHETERIZATION N/A 05/17/2015   Procedure: Left Heart Cath and Coronary Angiography;  Surgeon: Wendall StadePeter C Nishan, MD;  [facility];  Service: Cardiovascular;  Laterality: N/A;   CARDIAC CATHETERIZATION  2012   CORONARY ANGIOPLASTY WITH STENT PLACEMENT  2011   CORONARY STENT INTERVENTION N/A 11/14/2017   Procedure: CORONARY STENT INTERVENTION;  Surgeon: Kathleene HazelMcAlhany, Christopher D, MD;  Location: [facility] INVASIVE CV LAB;  Service: Cardiovascular;  Laterality: N/A;   ICD IMPLANT  11/2009   "w/pacing function"    RIGHT/LEFT HEART CATH AND CORONARY ANGIOGRAPHY N/A 11/14/2017   Procedure: RIGHT/LEFT HEART CATH AND CORONARY ANGIOGRAPHY;  Surgeon: Kathleene HazelMcAlhany, Christopher D, MD;  Location: [facility] INVASIVE CV LAB;  Service: Cardiovascular;  Laterality: N/A;        Home Medications    Prior to Admission medications   Medication Sig Start Date End Date Taking? Authorizing Provider  albuterol (VENTOLIN HFA) 108 (90 Base) MCG/ACT inhaler Inhale 1-2 puffs into the lungs every 6 (six) hours as needed for wheezing or shortness of breath. 07/03/18   Corwin LevinsJohn, James W, MD  ALPRAZolam Prudy Feeler(XANAX) 0.5 MG tablet TAKE 1 TABLET (0.5 MG TOTAL) BY MOUTH DAILY AS NEEDED FOR ANXIETY. 05/06/18   Corwin LevinsJohn, James W, MD  anastrozole (ARIMIDEX) 1 MG tablet Take 1 mg by mouth daily.    [provider]  aspirin EC 81 MG tablet Take 81 mg by mouth daily.    [provider]  atorvastatin (LIPITOR) 80 MG tablet Take 1 tablet (80 mg total) by mouth daily. 11/15/17 11/15/18  Marcelino Dusteruke, Angela Nicole, PA  budesonide-formoterol (SYMBICORT) 80-4.5 MCG/ACT inhaler  Inhale 2 puffs into the lungs 2 (two) times daily as needed (wheezing). 07/03/18   Corwin LevinsJohn, James W, MD  carvedilol (COREG) 6.25 MG tablet TAKE 1 TABLET BY MOUTH TWICE A DAY 01/07/18   Wendall StadeNishan, Peter C, MD  Coenzyme Q10 (CO Q 10) 100 MG CAPS Take 100 mg by mouth daily.     [provider]  fluticasone (FLONASE) 50 MCG/ACT nasal spray PLACE 2 SPRAYS INTO BOTH NOSTRILS DAILY AS NEEDED (SEASONAL ALLERGIES). 06/18/17   Anne NgNche, Charlotte Lum, NP  KLOR-CON M20 20 MEQ tablet TAKE 2 TABLETS BY MOUTH EVERY DAY 10/31/17   Duke SalviaKlein, Steven C, MD  metolazone (ZAROXOLYN) 5 MG tablet TAKE 1 TABLET (5 MG TOTAL) BY MOUTH DAILY AS NEEDED (FOR EDEMA). 10/08/16   Wendall StadeNishan, Peter C, MD  mexiletine (MEXITIL) 200 MG capsule TAKE 1 CAPSULE BY MOUTH TWICE A DAY 02/10/18   Wendall StadeNishan, Peter C, MD  nitroGLYCERIN (NITROSTAT) 0.4 MG SL tablet Place 1 tablet (0.4 mg total) under the tongue every 5 (five) minutes as needed for  chest pain. 10/02/17 12/31/17  Jodelle GrossLawrence, Kathryn M, NP  prasugrel (EFFIENT) 10 MG TABS tablet Take 1 tablet (10 mg total) by mouth daily. 11/16/17   Duke, Roe RutherfordAngela Nicole, PA  sacubitril-valsartan (ENTRESTO) 24-26 MG Take 1 tablet by mouth 2 (two) times daily. 03/20/18   Duke SalviaKlein, Steven C, MD  spironolactone (ALDACTONE) 25 MG tablet Take 0.5 tablets (12.5 mg total) by mouth daily. 10/10/17   Duke SalviaKlein, Steven C, MD  tiZANidine (ZANAFLEX) 4 MG tablet 1 tab by mouth twice per day as needed 07/03/18   Corwin LevinsJohn, James W, MD  torsemide (DEMADEX) 20 MG tablet Take 2 tablets (40 mg total) by mouth 2 (two) times daily. 09/16/17   Wendall StadeNishan, Peter C, MD  traMADol (ULTRAM) 50 MG tablet Take 1 tablet (50 mg total) by mouth every 12 (twelve) hours as needed. 01/01/18   Judi SaaSmith, Zachary M, DO  Vitamin D, Ergocalciferol, (DRISDOL) 1.25 MG (50000 UT) CAPS capsule Take 1 capsule (50,000 Units total) by mouth every 7 (seven) days. 07/24/18   Judi SaaSmith, Zachary M, DO    Family History Family History  Problem Relation Age of Onset   Heart attack Father    Heart disease Other    Diabetes Other    Hypothyroidism Other     Social History Social History   Tobacco Use   Smoking status: Former Smoker    Packs/day: 0.10    Years: 2.00    Pack years: 0.20    Types: Cigarettes    Quit date: 02/18/1994    Years since quitting: 24.5   Smokeless tobacco: Never Used  Substance Use Topics   Alcohol use: Yes    Comment: 11/25/2017 "couple beers/month; if that"   Drug use: Never     Allergies   Levaquin [levofloxacin], Ace inhibitors, Crestor [rosuvastatin], Lunesta [eszopiclone], and Zocor [simvastatin]   Review of Systems Review of Systems  Constitutional: Positive for fatigue. Negative for fever.  HENT: Negative for sore throat.   Eyes: Negative for visual disturbance.  Respiratory: Negative for cough and shortness of breath.   Cardiovascular: Positive for chest pain. Negative for orthopnea and syncope.  Gastrointestinal:  Positive for nausea. Negative for abdominal pain.  Genitourinary: Negative for dysuria.  Musculoskeletal: Negative for back pain.  Skin: Negative for rash.  Neurological: Positive for dizziness. Negative for numbness.     Physical Exam Updated Vital Signs BP 128/88 (BP Location: Left Arm)    Pulse 75    Temp 98.5  F (36.9 C) (Oral)    Resp 20    Ht 6\' 2"  (1.88 m)    Wt 92.1 kg    SpO2 98%    BMI 26.06 kg/m   Physical Exam Vitals signs and nursing note reviewed.  Constitutional:      Appearance: He is well-developed.  HENT:     Head: Normocephalic and atraumatic.  Eyes:     Conjunctiva/sclera: Conjunctivae normal.  Neck:     Musculoskeletal: Neck supple.  Cardiovascular:     Rate and Rhythm: Normal rate and regular rhythm.     Heart sounds: Normal heart sounds. No murmur.  Pulmonary:     Effort: Pulmonary effort is normal. No respiratory distress.     Breath sounds: Normal breath sounds.  Abdominal:     Palpations: Abdomen is soft.     Tenderness: There is no abdominal tenderness.  Musculoskeletal: Normal range of motion.     Right lower leg: He exhibits no tenderness. No edema.     Left lower leg: He exhibits no tenderness. No edema.  Skin:    General: Skin is warm and dry.     Capillary Refill: Capillary refill takes less than 2 seconds.  Neurological:     General: No focal deficit present.     Mental Status: He is alert and oriented to person, place, and time.      ED Treatments / Results  Labs (all labs ordered are listed, but only abnormal results are displayed) Labs Reviewed  BASIC METABOLIC PANEL - Abnormal; Notable for the following components:      Result Value   Glucose, Bld 101 (*)    All other components within normal limits  CBC WITH DIFFERENTIAL/PLATELET - Abnormal; Notable for the following components:   RBC 5.89 (*)    All other components within normal limits  SARS CORONAVIRUS 2 (HOSPITAL ORDER, PERFORMED IN [facility])    MAGNESIUM  BRAIN NATRIURETIC PEPTIDE  SEDIMENTATION RATE  TROPONIN I (HIGH SENSITIVITY)  TROPONIN I (HIGH SENSITIVITY)    EKG EKG Interpretation  Date/Time:  Sunday September 06 2018 11:17:24 EDT Ventricular Rate:  80 PR Interval:    QRS Duration: 96 QT Interval:  358 QTC Calculation: 413 R Axis:   44 Text Interpretation:  Sinus rhythm Probable left atrial enlargement Anterior infarct, old similar to prior 10/19 Confirmed by Meridee Score [phone]) on 09/06/2018 11:25:43 AM   Radiology Dg Chest Port 1 View  Result Date: 09/06/2018 CLINICAL DATA:  Chest pain. EXAM: PORTABLE CHEST 1 VIEW COMPARISON:  Radiographs of November 25, 2017 FINDINGS: The heart size and mediastinal contours are within normal limits. Both lungs are clear. Left-sided pacemaker is unchanged in position. No pneumothorax or pleural effusion is noted. The visualized skeletal structures are unremarkable. IMPRESSION: No active disease. Electronically Signed   By: Lupita Raider M.D.   On: 09/06/2018 12:29    Procedures Procedures (including critical care time)  Medications Ordered in ED Medications - No data to display   Initial Impression / Assessment and Plan / ED Course  I have reviewed the triage vital signs and the nursing notes.  Pertinent labs & imaging results that were available during my care of the patient were reviewed by me and considered in my medical decision making (see chart for details).  Clinical Course as of Sep 05 1729  Sun Sep 06, 2018  [id number] 43 year old male with known coronary disease and low EF here with increased fatigue and some vague chest  discomfort and feeling lightheaded with possible tachycardia.  His vitals are unremarkable here and he appears very well.  EKG not showing any specific arrhythmia or STEMI.  Differential includes ACS, CHF, arrhythmia, medication side effect, pneumonia, Covid, metabolic derangement.   [MB]  1155 Discussed with Dr. Wyline MoodBranch from cardiology who said somebody  from the group will consult on the patient.   [MB]  1246 Patient was seen by Dr. Eden EmmsNishan from cardiology who did does not feel this is likely cardiac.  He recommended adding on a sed rate as patient has had some rheumatologic issues that are not clearly identified.  He said if there is nothing serious going on with his lab work he can follow-up in the office.   [MB]  1412 I reviewed the results with the patient.  He is comfortable with discharge and said he will follow-up with cardiology as an outpatient.  Has as far as his rheumatology goes he [place] has not really followed up with them recently.  He understands that he should probably make an appointment with them also.   [MB]  1429 Delta troponin unchanged.  I reviewed all this with the patient and he is comfortable going home.  He understands to follow-up with his doctors.  I also heard back that the AICD interrogation was unremarkable.     [MB]    Clinical Course User Index [MB] Terrilee FilesButler, Brookley Spitler C, MD   Jason Hogan was evaluated in Emergency Department on 09/06/2018 for the symptoms described in the history of present illness. He was evaluated in the context of the global COVID-19 pandemic, which necessitated consideration that the patient might be at risk for infection with the SARS-CoV-2 virus that causes COVID-19. Institutional protocols and algorithms that pertain to the evaluation of patients at risk for COVID-19 are in a state of rapid change based on information released by regulatory bodies including the CDC and federal and state organizations. These policies and algorithms were followed during the patient's care in the ED.      Final Clinical Impressions(s) / ED Diagnoses   Final diagnoses:  Nonspecific chest pain  Lightheadedness    ED Discharge Orders    None       Terrilee FilesButler, Leydi Winstead C, MD 09/06/18 1732

## 2018-09-25 NOTE — Telephone Encounter (Signed)
I know Anderson Malta had put in the referral both at his OV with me as well as when I sent her that message. Perhaps the patient elected not to go. Will route to JW to help f/u. With his young age and cardiac hx I do think it is worthwhile for him to meet them but decision is ultimately up to the patient. I discussed his case with Dr. Haroldine Laws who also agreed with the need for referral. (For example, when I met him in July we offered next available f/u with [facility] but he wanted to f/u in Sept which is currently scheduled). Demar Shad PA-C
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
## 2018-10-20 ENCOUNTER — Other Ambulatory Visit: Payer: Self-pay | Admitting: Cardiovascular Disease

## 2018-10-20 MED ORDER — ATORVASTATIN CALCIUM 80 MG PO TABS: 80.0000 mg | ORAL_TABLET | Freq: Every day | ORAL | 3 refills | Status: DC

## 2018-10-20 NOTE — Telephone Encounter (Signed)
Pt's medication was sent to pt's pharmacy as requested. Confirmation received.  °

## 2018-10-21 ENCOUNTER — Encounter: Payer: Self-pay | Admitting: Cardiovascular Disease

## 2018-10-21 ENCOUNTER — Ambulatory Visit (INDEPENDENT_AMBULATORY_CARE_PROVIDER_SITE_OTHER): Payer: Self-pay | Admitting: Cardiovascular Disease

## 2018-10-21 ENCOUNTER — Other Ambulatory Visit: Payer: Self-pay

## 2018-10-21 VITALS — BP 130/70 | HR 88 | Ht 74.0 in | Wt 206.0 lb

## 2018-10-21 DIAGNOSIS — I2510 Atherosclerotic heart disease of native coronary artery without angina pectoris: Secondary | ICD-10-CM

## 2018-10-21 DIAGNOSIS — E785 Hyperlipidemia, unspecified: Secondary | ICD-10-CM

## 2018-10-21 DIAGNOSIS — E7800 Pure hypercholesterolemia, unspecified: Secondary | ICD-10-CM

## 2018-10-21 NOTE — Patient Instructions (Addendum)
Your physician recommends that you continue on your current medications as directed. Please refer to the Current Medication list given to you today. Your physician recommends that you return for lab work in: LIPID AND LIVER  FASTING AT [place] have been referred to [place] Your physician wants you to follow-up in: [place] will receive a reminder letter in the mail two months in advance. If you don't receive a letter, please call our office to schedule the follow-up appointment.

## 2018-10-22 ENCOUNTER — Other Ambulatory Visit: Payer: Self-pay | Admitting: *Deleted

## 2018-10-22 DIAGNOSIS — E785 Hyperlipidemia, unspecified: Secondary | ICD-10-CM

## 2018-10-22 LAB — HEPATIC FUNCTION PANEL
ALT: 25 IU/L (ref 0–44)
AST: 20 IU/L (ref 0–40)
Albumin: 4.6 g/dL (ref 4.0–5.0)
Alkaline Phosphatase: 60 IU/L (ref 39–117)
Bilirubin Total: 0.4 mg/dL (ref 0.0–1.2)
Bilirubin, Direct: 0.12 mg/dL (ref 0.00–0.40)
Total Protein: 6.7 g/dL (ref 6.0–8.5)

## 2018-10-22 LAB — LIPID PANEL
Chol/HDL Ratio: 4.2 ratio (ref 0.0–5.0)
Cholesterol, Total: 179 mg/dL (ref 100–199)
HDL: 43 mg/dL (ref >39)
LDL Chol Calc (NIH): 123 mg/dL — ABNORMAL HIGH (ref 0–99)
Triglycerides: 71 mg/dL (ref 0–149)
VLDL Cholesterol Cal: 13 mg/dL (ref 5–40)

## 2018-11-05 ENCOUNTER — Other Ambulatory Visit: Payer: Self-pay

## 2018-11-05 ENCOUNTER — Ambulatory Visit (INDEPENDENT_AMBULATORY_CARE_PROVIDER_SITE_OTHER): Payer: Self-pay | Admitting: Pharmacist

## 2018-11-05 VITALS — BP 124/82 | HR 79 | Ht 74.0 in | Wt 208.8 lb

## 2018-11-05 DIAGNOSIS — E7800 Pure hypercholesterolemia, unspecified: Secondary | ICD-10-CM

## 2018-11-05 DIAGNOSIS — G720 Drug-induced myopathy: Secondary | ICD-10-CM

## 2018-11-05 DIAGNOSIS — T466X5A Adverse effect of antihyperlipidemic and antiarteriosclerotic drugs, initial encounter: Secondary | ICD-10-CM

## 2018-11-05 NOTE — Progress Notes (Addendum)
Patient ID: Jason Hogan                 DOB: Sep 24, 1975                    MRN: [id number]     HPI: Jason Hogan is a 43 y.o. male patient referred to lipid clinic by Dr [name]. PMH is significant for CAD (large anterior MI in 2011 while working out at the gym, s/p PTCA/DES to 100% occluded LAD with occluded collateralized silent RCA, and balloon angioplasty to stented segment of LAD and DES x1 to mid circumflex in 10/2017), HFrEF with LVEF of 30-35% from echo on 11/10/17, ischemic cardiomyopathy s/p AICD, prior VT/ ICD shock in 10/2016, HTN, HLD, mild renal insufficiency, elevated sedimentation rate, anxiety, and asthma. Pt was seen by Dr [name] 2 weeks ago - pt reported stopping his atorvastatin 2 months prior due to myalgias and chest pain. He presents today for further management.  Patient presents today in good spirits. He is previously intolerant to rosuvastatin, simvastatin, and most recently atorvastatin. He stopped atorvastatin in July 2020 due to joint aches and nausea and has not been on lipid lowering therapy since. He has been noticing more skipped beats after eating food lately. Avoiding spicy food recently, changed to gluten free diet to help reduce his inflammation and sedimentation rate.  Current Medications: none Intolerances: atorvastatin 1m, 29m and 8046maily (2016-2020), rosuvastatin 52m80mily (2012-2014), simvastatin 20mg66mly Risk Factors: CAD s/p MI in 2011, stenting in 2019, CHF, HTN, family history LDL goal: <55mg/19mue to recurrent CAD  Diet: Cut back on red meat, eating more raw fish. Switched to gluten free diet due to high sedimentation rate, uses olive oil, eats avocadoes. 1/2 cup of coffee in the AM, no other caffeine. Cut back on beer duet o gluten - a few drinks every few weeks  Exercise: Works out 3-5x per week  Family History: The patient's family history includes Diabetes in an other family member; Heart attack in his father at age 70; He96t disease in  an other family member; Hypothyroidism in an other family member.  Social History: Former smoker a few cigarettes per day for 2 years, quit in 1996. Drinks a few beers per month, denies illicit drug use. 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  Labs: 10/22/18: TC 179, TG 71, HDL 43, LDL 123 (no lipid lowering therapy) 07/03/18: TC 132, TG 188, HDL 31.2, LDL 63 (atorvastatin 80mg d44m)  Past Medical History:  Diagnosis Date  . Acute Myocardial Infarction 08/18/2009   Qualifier: Diagnosis of  By: Barnes,Burnett Kanarisllergic rhinitis, cause unspecified 10/03/2011  . Anxiety 10/03/2011  . Asthma 09/20/2006   Qualifier: Diagnosis of  By: John MDJenny Reichmannmes WHunt Oristomatic implantable cardiac defibrillator in situ 01/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 Qualifier: Diagnosis of  By: Nishan,Johnsie CancelACC, PRona RavensD (coronary artery disease)    a. large anterior MI 2011 s/p PTCA/DES to 100%. b. occluded LAD with occluded collateralized silent RCA, balloon angioplasty to stented segment of LAD and DES x1 to mid circumflex in 10/2017.  . CholeMarland Kitchenithiasis 06/22/2007   Qualifier: Diagnosis of  By: SherwooElveria Royalsronic systolic heart failure (HCC) 8/[place]011   Qualifier: Diagnosis of  By: Barnes,Burnett Kanarisepressive disorder, not elsewhere classified 06/22/2007  Qualifier: Diagnosis of  By: [name]   . History of gout   . History of kidney stones   . Hyperlipidemia 81/2011  . Hypertension   . Impaired glucose tolerance 10/05/2012  . Insomnia, unspecified 06/22/2007   Qualifier: Diagnosis of  By: [name], [name]   . Ischemic cardiomyopathy 10/20/2009   Qualifier: Diagnosis of  By: [name], MD, [facility], Mack Guise   . 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 Migraine Headache 09/18/2009   Qualifier: Diagnosis of  By: [name]    . Renal calculus   . Renal Insufficiency 09/18/2009   Qualifier:  Diagnosis of  By: [name]    . Respiratory failure ([place]) 08/2009  . Rhabdomyolysis 09/18/2009   Qualifier: Diagnosis of  By: [name]    . VT (ventricular tachycardia) ([place])     Current Outpatient Medications on File Prior to Visit  Medication Sig Dispense Refill  . albuterol (VENTOLIN HFA) 108 (90 Base) MCG/ACT inhaler Inhale 1-2 puffs into the lungs every 6 (six) hours as needed for wheezing or shortness of breath. 8.5 Inhaler 11  . ALPRAZolam (XANAX) 0.5 MG tablet TAKE 1 TABLET (0.5 MG TOTAL) BY MOUTH DAILY AS NEEDED FOR ANXIETY. 30 tablet 2  . anastrozole (ARIMIDEX) 1 MG tablet Take 1 mg by mouth daily.    [name] aspirin EC 81 MG tablet Take 81 mg by mouth daily.    . budesonide-formoterol (SYMBICORT) 80-4.5 MCG/ACT inhaler Inhale 2 puffs into the lungs 2 (two) times daily as needed (wheezing). 1 Inhaler 11  . carvedilol (COREG) 6.25 MG tablet TAKE 1 TABLET BY MOUTH TWICE A DAY 180 tablet 3  . Coenzyme Q10 (CO Q 10) 100 MG CAPS Take 100 mg by mouth daily.     . fluticasone (FLONASE) 50 MCG/ACT nasal spray PLACE 2 SPRAYS INTO BOTH NOSTRILS DAILY AS NEEDED (SEASONAL ALLERGIES). 16 g 3  . KLOR-CON M20 20 MEQ tablet TAKE 2 TABLETS BY MOUTH EVERY DAY 180 tablet 3  . metolazone (ZAROXOLYN) 5 MG tablet TAKE 1 TABLET (5 MG TOTAL) BY MOUTH DAILY AS NEEDED (FOR EDEMA). 30 tablet 9  . mexiletine (MEXITIL) 200 MG capsule TAKE 1 CAPSULE BY MOUTH TWICE A DAY 60 capsule 11  . nitroGLYCERIN (NITROSTAT) 0.4 MG SL tablet Place 1 tablet (0.4 mg total) under the tongue every 5 (five) minutes as needed for chest pain. 90 tablet 3  . prasugrel (EFFIENT) 10 MG TABS tablet Take 1 tablet (10 mg total) by mouth daily. 90 tablet 3  . sacubitril-valsartan (ENTRESTO) 24-26 MG Take 1 tablet by mouth 2 (two) times daily. 180 tablet 3  . spironolactone (ALDACTONE) 25 MG tablet Take 0.5 tablets (12.5 mg total) by mouth daily. 45 tablet 3  . tiZANidine (ZANAFLEX) 4 MG tablet 1 tab by mouth twice per day as  needed 60 tablet 2  . torsemide (DEMADEX) 20 MG tablet Take 20 mg by mouth daily.    . traMADol (ULTRAM) 50 MG tablet Take 1 tablet (50 mg total) by mouth every 12 (twelve) hours as needed. 30 tablet 0  . Vitamin D, Ergocalciferol, (DRISDOL) 1.25 MG (50000 UT) CAPS capsule Take 1 capsule (50,000 Units total) by mouth every 7 (seven) days. 12 capsule 0   No current facility-administered medications on file prior to visit.     Allergies  Allergen Reactions  . Levaquin [Levofloxacin] Other (See Comments)    Joint pain  . Ace Inhibitors Cough  . Crestor [Rosuvastatin] Other (See Comments)    "  Sweet cravings"  . Lunesta [Eszopiclone] Other (See Comments)    "Bitter taste in mouth"  . Zocor [Simvastatin] Other (See Comments)    Memory problem    Assessment/Plan:  1. Hyperlipidemia - Baseline LDL 115m/dL above goal < 562mdL due to recurrent ASCVD. Pt is intolerant to 3 statins. Discussed benefits of PCSK9i therapy. Pt filled out patient assistance paperwork for Repatha coverage since he does not have medication insurance. [employer] Safety Net application has been submitted today. Pt also provided with 2 samples of Repatha in the office. Will follow up with pt once SNF application is approved - will plan to schedule f/u lab work at that time.   [employer] Evolocumab [id number] No: [id number] ID No: 2     Did subject meet all eligibility criteria?  Code  No Yes  1025Adults age ? 40 years _0  _1   One or both of the following:   102 A  Hospitalization for a clinical ASCVD event: acute (MI), UA, IS, or CLI within 18 months of enrollment        Note: Subjects must have been admitted to the hospital. Those       who are admitted and discharged in less than 24 hours are      Eligible for the study.Subjects who have been admitted to     The ER for a clinical ASCVD. _2  _3   102B Coronary or peripheral revascularization including percutaneous or surgical revascularization in the past 18 months  _4  _5   One of the following:  103 A  LDL ? 70 mg/dL (1.81 mmol/L) with no plans for immediate initiation or titration of statin therapy _6  _7   103 B Newly started on PCSK9i after the index hospitalization/procedure and prior to enrollment (but no more than 6 months prior to enrollment) with pre-PCSK9i treatment LDL-C value available and measured within 6 months of starting PCSK9i and known background LLT any time prior to PCSK9i initiation. _8  _9   105 Planned follow-up within the health system _10  _11   Subject does not meet any of the following exclusion criteria   201 Unable or unwilling to provide informed consent, including but not limited to cognitive or language barriers (reading or comprehension) _12  _13   202 Lack of phone or email for contact _14  _15   203 Evidence of end stage renal disease (ESRD) or stage 5 CKD _16  _17   204 Anticipated life expectancy less than 6 month  _18  _19   206  On a PCSK9i prior to their qualifying event _20  _21      Subject Name: Jason GEISTERSubject met inclusion and exclusion criteria.  The informed consent form, study requirements and expectations were reviewed with the subject and questions and concerns were addressed prior to the signing of the consent form.  The subject verbalized understanding of the trial requirements.  The subject agreed to participate in the CVHeckschervillerial and signed the informed consent at 9:30am on 11/04/18.  The informed consent was obtained prior to performance of any protocol-specific procedures for the subject.  A copy of the signed informed consent was given to the subject and a copy was placed in the subject's medical record.   Socioeconomic Status - Baseline  Education Some High School or Less High School graduate Some College/University Graduated College/University or above _22  _23  _24  _25    Marital Status Married Single Divorced Separated _26  _27  _28  _29    Income <$15,000 $15,001 - $34,999 $35,000 - $74,999  $75,000 - $99,999 >$100,000 _30  _31  _32  _33  [  x]    Megan E. Supple, PharmD, BCACP, [employer] [address], [place], [place] [address] Phone: [phone]; Fax: [phone] 11/05/2018 10:16 AM

## 2018-11-05 NOTE — Patient Instructions (Addendum)
It was nice to see you today  Your LDL cholesterol is 123 and Dr [name] would like it definitely < 70 and ideally < 55 to help lower your risk of having a future heart attack  I will submit paperwork to the [facility] to see if they will cover the cost of Repatha injections for you. This is a subcutaneous shot that you'll inject into the fatty tissue in your abdomen once every 2 weeks. Keep your extra shots in the fridge until you are ready to use them. They will lower your LDL cholesterol by 60%  Once you are approved for Repatha, you can call the [facility] at [phone] to coordinate shipments of the medication to your house

## 2018-11-06 ENCOUNTER — Ambulatory Visit: Payer: Self-pay

## 2018-11-06 ENCOUNTER — Telehealth: Payer: Self-pay | Admitting: Pharmacist

## 2018-11-06 NOTE — Telephone Encounter (Signed)
Patient approved though SNF to receive Repatha for free. Called and informed patient of this. He requested I send the phone # to call to set up shipment to him via mychart.  Patient states he feels comfortable doing injections at home.

## 2018-11-10 ENCOUNTER — Ambulatory Visit (INDEPENDENT_AMBULATORY_CARE_PROVIDER_SITE_OTHER): Payer: Self-pay | Admitting: *Deleted

## 2018-11-10 DIAGNOSIS — I255 Ischemic cardiomyopathy: Secondary | ICD-10-CM

## 2018-11-10 DIAGNOSIS — I428 Other cardiomyopathies: Secondary | ICD-10-CM

## 2018-11-10 DIAGNOSIS — I429 Cardiomyopathy, unspecified: Secondary | ICD-10-CM

## 2018-11-11 LAB — CUP PACEART REMOTE DEVICE CHECK
Battery Remaining Longevity: 28 mo
Battery Remaining Percentage: 27 %
Battery Voltage: 2.83 V
Brady Statistic RV Percent Paced: 0 %
Date Time Interrogation Session: 20200920035645
HighPow Impedance: 46 Ohm
Implantable Lead Implant Date: 20111010
Implantable Lead Location: 753860
Implantable Lead Model: 7121
Implantable Pulse Generator Implant Date: 20111010
Lead Channel Impedance Value: 1000 Ohm
Lead Channel Pacing Threshold Amplitude: 2 V
Lead Channel Pacing Threshold Pulse Width: 1 ms
Lead Channel Sensing Intrinsic Amplitude: 11.9 mV
Lead Channel Setting Pacing Amplitude: 4 V
Lead Channel Setting Pacing Pulse Width: 1 ms
Lead Channel Setting Sensing Sensitivity: 0.5 mV
Pulse Gen Serial Number: 615318

## 2018-11-17 ENCOUNTER — Ambulatory Visit (HOSPITAL_COMMUNITY)
Admission: RE | Admit: 2018-11-17 | Discharge: 2018-11-17 | Disposition: A | Discharge status: Home / Self Care | Payer: Self-pay | Source: Ambulatory Visit | Attending: Cardiology | Admitting: Cardiology

## 2018-11-17 ENCOUNTER — Other Ambulatory Visit: Payer: Self-pay

## 2018-11-17 VITALS — BP 110/82 | HR 90 | Wt 207.8 lb

## 2018-11-17 DIAGNOSIS — R0789 Other chest pain: Secondary | ICD-10-CM | POA: Insufficient documentation

## 2018-11-17 DIAGNOSIS — F419 Anxiety disorder, unspecified: Secondary | ICD-10-CM | POA: Insufficient documentation

## 2018-11-17 DIAGNOSIS — Z79899 Other long term (current) drug therapy: Secondary | ICD-10-CM | POA: Insufficient documentation

## 2018-11-17 DIAGNOSIS — I255 Ischemic cardiomyopathy: Secondary | ICD-10-CM | POA: Insufficient documentation

## 2018-11-17 DIAGNOSIS — M791 Myalgia, unspecified site: Secondary | ICD-10-CM | POA: Insufficient documentation

## 2018-11-17 DIAGNOSIS — Z7982 Long term (current) use of aspirin: Secondary | ICD-10-CM | POA: Insufficient documentation

## 2018-11-17 DIAGNOSIS — J45909 Unspecified asthma, uncomplicated: Secondary | ICD-10-CM | POA: Insufficient documentation

## 2018-11-17 DIAGNOSIS — E785 Hyperlipidemia, unspecified: Secondary | ICD-10-CM | POA: Insufficient documentation

## 2018-11-17 DIAGNOSIS — Z95810 Presence of automatic (implantable) cardiac defibrillator: Secondary | ICD-10-CM | POA: Insufficient documentation

## 2018-11-17 DIAGNOSIS — Z955 Presence of coronary angioplasty implant and graft: Secondary | ICD-10-CM | POA: Insufficient documentation

## 2018-11-17 DIAGNOSIS — I2510 Atherosclerotic heart disease of native coronary artery without angina pectoris: Secondary | ICD-10-CM | POA: Insufficient documentation

## 2018-11-17 DIAGNOSIS — I110 Hypertensive heart disease with heart failure: Secondary | ICD-10-CM | POA: Insufficient documentation

## 2018-11-17 DIAGNOSIS — Z8249 Family history of ischemic heart disease and other diseases of the circulatory system: Secondary | ICD-10-CM | POA: Insufficient documentation

## 2018-11-17 DIAGNOSIS — Z7902 Long term (current) use of antithrombotics/antiplatelets: Secondary | ICD-10-CM | POA: Insufficient documentation

## 2018-11-17 DIAGNOSIS — I493 Ventricular premature depolarization: Secondary | ICD-10-CM

## 2018-11-17 DIAGNOSIS — I252 Old myocardial infarction: Secondary | ICD-10-CM | POA: Insufficient documentation

## 2018-11-17 DIAGNOSIS — Z7901 Long term (current) use of anticoagulants: Secondary | ICD-10-CM | POA: Insufficient documentation

## 2018-11-17 DIAGNOSIS — I5022 Chronic systolic (congestive) heart failure: Secondary | ICD-10-CM | POA: Insufficient documentation

## 2018-11-17 LAB — BASIC METABOLIC PANEL
Anion gap: 11 (ref 5–15)
BUN: 14 mg/dL (ref 6–20)
CO2: 24 mmol/L (ref 22–32)
Calcium: 9.5 mg/dL (ref 8.9–10.3)
Chloride: 103 mmol/L (ref 98–111)
Creatinine, Ser: 1.07 mg/dL (ref 0.61–1.24)
GFR calc Af Amer: >60 mL/min (ref >60)
GFR calc non Af Amer: >60 mL/min (ref >60)
Glucose, Bld: 85 mg/dL (ref 70–99)
Potassium: 4 mmol/L (ref 3.5–5.1)
Sodium: 138 mmol/L (ref 135–145)

## 2018-11-17 LAB — MAGNESIUM: Magnesium: 2 mg/dL (ref 1.7–2.4)

## 2018-11-17 MED ORDER — ENTRESTO 49-51 MG PO TABS: 1.0000 | ORAL_TABLET | Freq: Two times a day (BID) | ORAL | 5 refills | Status: DC

## 2018-11-17 MED ORDER — TORSEMIDE 20 MG PO TABS: ORAL_TABLET | ORAL | 2 refills | Status: DC

## 2018-11-17 NOTE — Patient Instructions (Addendum)
Labs done today. We will contact you only if your labs are abnormal.  INCREASE Entresto 49-51mg (1 tab) by mouth 2 times daily.  DECREASE Torsemide 20mg (1 tab) by mouth every 3 days.  DECREASE Effient to 5mg  daily(1/2 tab) on oct 1st 2020. If you need a refill please contact us.  Your physician recommends that you schedule a follow-up appointment in: 1 month  Your physician has requested that you have an echocardiogram. Echocardiography is a painless test that uses sound waves to create images of your heart. It provides your doctor with information about the size and shape of your heart and how well your heart's chambers and valves are working. This procedure takes approximately one hour. There are no restrictions for this procedure.  Your physician has recommended that you have a cardiopulmonary stress test (CPX). CPX testing is a non-invasive measurement of heart and lung function. It replaces a traditional treadmill stress test. This type of test provides a tremendous amount of information that relates not only to your present condition but also for future outcomes. This test combines measurements of you ventilation, respiratory gas exchange in the lungs, electrocardiogram (EKG), blood pressure and physical response before, during, and following an exercise protocol. Someone will contact you to schedule this appointment(YOU WILL HAVE TO HAVE A COVID-19 SCREENING TEST PRIOR TO STRESS TEST)  Your provider has recommended that  you wear a Zio Patch for 3 days.  This monitor will record your heart rhythm for our review.  IF you have any symptoms while wearing the monitor please press the button.  If you have any issues with the patch or you notice a red or orange light on it please call the company at [phone].  Once you remove the patch please mail it back to the company as soon as possible so we can get the results.

## 2018-11-18 ENCOUNTER — Telehealth (HOSPITAL_COMMUNITY): Payer: Self-pay

## 2018-11-18 NOTE — Telephone Encounter (Signed)
-----   Message from Larey Dresser, MD sent at 11/18/2018  1:21 AM EDT ----- I told him at appointment today to decrease Effient to 5 mg daily.  Let him know to keep it at 10 mg daily until followup, at that time will consider transition to Plavix.
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
## 2018-11-18 NOTE — Telephone Encounter (Signed)
Pt advised to remain on 10mg  of Effient until his next follow at which time there will be discussion on a possible change. Pt verbalized understanding.

## 2018-11-20 ENCOUNTER — Telehealth: Payer: Self-pay | Admitting: Pharmacist

## 2018-11-20 NOTE — Telephone Encounter (Signed)
Baseline lipids drawn 11/18/18 for [facility] lipid registry - Lp(a) checked for free.  TC 269, TG 203, HDL 45, LDL 186, VLDL 41, non-HDL 224, Lp(a) 255  LDL notably higher than lipid panel drawn on 10/22/18 (was 123 at the time). 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   Left message for pt to review lab results, ensure he has been able to start on Repatha injections to help lower LDL and Lp(a), and to schedule follow up lab work in 2 months.

## 2018-11-23 NOTE — Progress Notes (Signed)
Remote ICD transmission.   

## 2018-11-27 ENCOUNTER — Ambulatory Visit (HOSPITAL_COMMUNITY)
Admission: RE | Admit: 2018-11-27 | Discharge: 2018-11-27 | Disposition: A | Discharge status: Home / Self Care | Payer: Self-pay | Source: Ambulatory Visit | Attending: Internal Medicine | Admitting: Internal Medicine

## 2018-11-27 ENCOUNTER — Other Ambulatory Visit: Payer: Self-pay

## 2018-11-27 DIAGNOSIS — I5022 Chronic systolic (congestive) heart failure: Secondary | ICD-10-CM | POA: Insufficient documentation

## 2018-11-27 LAB — BASIC METABOLIC PANEL
Anion gap: 8 (ref 5–15)
BUN: 7 mg/dL (ref 6–20)
CO2: 26 mmol/L (ref 22–32)
Calcium: 9.5 mg/dL (ref 8.9–10.3)
Chloride: 105 mmol/L (ref 98–111)
Creatinine, Ser: 1.03 mg/dL (ref 0.61–1.24)
GFR calc Af Amer: >60 mL/min (ref >60)
GFR calc non Af Amer: >60 mL/min (ref >60)
Glucose, Bld: 83 mg/dL (ref 70–99)
Potassium: 4.2 mmol/L (ref 3.5–5.1)
Sodium: 139 mmol/L (ref 135–145)

## 2018-12-09 NOTE — Addendum Note (Signed)
Encounter addended by: Micki Riley, RN on: 12/09/2018 2:52 PM  Actions taken: Imaging Exam ended

## 2018-12-16 ENCOUNTER — Encounter (HOSPITAL_COMMUNITY): Payer: Self-pay

## 2018-12-21 ENCOUNTER — Other Ambulatory Visit: Payer: Self-pay | Admitting: Family Medicine

## 2018-12-22 ENCOUNTER — Telehealth: Payer: Self-pay

## 2018-12-22 NOTE — Telephone Encounter (Signed)
Unable to lmom for lipid lab

## 2019-01-01 ENCOUNTER — Other Ambulatory Visit: Payer: Self-pay | Admitting: Internal Medicine

## 2019-01-11 ENCOUNTER — Other Ambulatory Visit: Payer: Self-pay

## 2019-01-11 MED ORDER — PRASUGREL HCL 10 MG PO TABS: 10.0000 mg | ORAL_TABLET | Freq: Every day | ORAL | 3 refills | Status: DC

## 2019-01-17 ENCOUNTER — Other Ambulatory Visit: Payer: Self-pay | Admitting: Internal Medicine

## 2019-01-17 ENCOUNTER — Other Ambulatory Visit: Payer: Self-pay | Admitting: Cardiovascular Disease

## 2019-01-21 ENCOUNTER — Telehealth: Payer: Self-pay | Admitting: Pharmacist

## 2019-01-21 DIAGNOSIS — E785 Hyperlipidemia, unspecified: Secondary | ICD-10-CM

## 2019-01-21 NOTE — Telephone Encounter (Signed)
Called pt to follow up with lipids - he reports tolerating Repatha much better than previous statins. Reports no joint pain and his appetite is coming back. States he feels a little run down on the days he gives his injections like he's coming down with a cold, but states this has improved the longer he's been on the medication and he wishes to continue therapy.  Scheduled follow up labs for tomorrow to assess efficacy of Repatha.

## 2019-01-22 ENCOUNTER — Other Ambulatory Visit: Payer: Self-pay

## 2019-01-22 ENCOUNTER — Other Ambulatory Visit: Payer: Self-pay | Admitting: *Deleted

## 2019-01-22 DIAGNOSIS — E785 Hyperlipidemia, unspecified: Secondary | ICD-10-CM

## 2019-01-22 LAB — HEPATIC FUNCTION PANEL
ALT: 48 IU/L — ABNORMAL HIGH (ref 0–44)
AST: 37 IU/L (ref 0–40)
Albumin: 4.7 g/dL (ref 4.0–5.0)
Alkaline Phosphatase: 69 IU/L (ref 39–117)
Bilirubin Total: 0.4 mg/dL (ref 0.0–1.2)
Bilirubin, Direct: 0.14 mg/dL (ref 0.00–0.40)
Total Protein: 7.3 g/dL (ref 6.0–8.5)

## 2019-01-22 LAB — LIPID PANEL
Chol/HDL Ratio: 3.4 ratio (ref 0.0–5.0)
Cholesterol, Total: 135 mg/dL (ref 100–199)
HDL: 40 mg/dL (ref >39)
LDL Chol Calc (NIH): 78 mg/dL (ref 0–99)
Triglycerides: 86 mg/dL (ref 0–149)
VLDL Cholesterol Cal: 17 mg/dL (ref 5–40)

## 2019-01-25 ENCOUNTER — Telehealth: Payer: Self-pay | Admitting: Pharmacist

## 2019-01-25 DIAGNOSIS — E785 Hyperlipidemia, unspecified: Secondary | ICD-10-CM

## 2019-01-25 MED ORDER — EZETIMIBE 10 MG PO TABS: 10.0000 mg | ORAL_TABLET | Freq: Every day | ORAL | 11 refills | Status: DC

## 2019-01-25 NOTE — Telephone Encounter (Signed)
Pt returned call to clinic - he is agreeable to starting ezetimibe 10mg  daily in addition to his Repatha. Rx [facility] to [facility] - GoodRx coupon makes 30 day supply $9.26. Rescheduled f/u lab work in another 3 months to assess efficacy.

## 2019-01-25 NOTE — Telephone Encounter (Signed)
Left message for pt to discuss. Spoke with pt last week to schedule labs and he reported adherence to Repatha injections. He had external labs drawn 11/18/18: TC 269, TG 203, HDL 45, LDL 186, VLDL 41, non-HDL 224, Lp(a) 255, so LDL has improved notably from that baseline to 78 (unsure why baseline differed so much from 9/3 labs checked at our office when LDL was 123). Pt is intolerant to 3 statins and does not have medication insurance.   Will discuss adding ezetimibe 10mg  daily (GoodRx coupon is $10/month at either [employer] or [employer]), plan to continue Repatha, and recheck lipids in another 3 months.

## 2019-02-09 ENCOUNTER — Ambulatory Visit (INDEPENDENT_AMBULATORY_CARE_PROVIDER_SITE_OTHER): Payer: Self-pay | Admitting: *Deleted

## 2019-02-09 ENCOUNTER — Encounter: Payer: Self-pay | Admitting: Internal Medicine

## 2019-02-09 DIAGNOSIS — I255 Ischemic cardiomyopathy: Secondary | ICD-10-CM

## 2019-02-10 ENCOUNTER — Other Ambulatory Visit: Payer: Self-pay | Admitting: Cardiovascular Disease

## 2019-02-10 NOTE — Telephone Encounter (Signed)
Staff to call  Pt  - for ROV for asthma  - ok for tomorrow am adon

## 2019-02-11 ENCOUNTER — Other Ambulatory Visit: Payer: Self-pay

## 2019-02-11 ENCOUNTER — Ambulatory Visit (INDEPENDENT_AMBULATORY_CARE_PROVIDER_SITE_OTHER): Payer: Self-pay | Admitting: Internal Medicine

## 2019-02-11 ENCOUNTER — Encounter: Payer: Self-pay | Admitting: Internal Medicine

## 2019-02-11 VITALS — BP 128/82 | HR 81 | Temp 98.9°F | Wt 225.6 lb

## 2019-02-11 DIAGNOSIS — I5022 Chronic systolic (congestive) heart failure: Secondary | ICD-10-CM

## 2019-02-11 DIAGNOSIS — J4531 Mild persistent asthma with (acute) exacerbation: Secondary | ICD-10-CM

## 2019-02-11 DIAGNOSIS — I10 Essential (primary) hypertension: Secondary | ICD-10-CM

## 2019-02-11 DIAGNOSIS — J45901 Unspecified asthma with (acute) exacerbation: Secondary | ICD-10-CM | POA: Insufficient documentation

## 2019-02-11 DIAGNOSIS — J4521 Mild intermittent asthma with (acute) exacerbation: Secondary | ICD-10-CM

## 2019-02-11 DIAGNOSIS — R7302 Impaired glucose tolerance (oral): Secondary | ICD-10-CM

## 2019-02-11 LAB — CUP PACEART REMOTE DEVICE CHECK
Battery Remaining Longevity: 28 mo
Battery Remaining Percentage: 27 %
Battery Voltage: 2.83 V
Brady Statistic RV Percent Paced: 1 %
Date Time Interrogation Session: 20201224092354
HighPow Impedance: 52 Ohm
Implantable Lead Implant Date: 20111010
Implantable Lead Location: 753860
Implantable Lead Model: 7121
Implantable Pulse Generator Implant Date: 20111010
Lead Channel Impedance Value: 1075 Ohm
Lead Channel Pacing Threshold Amplitude: 2 V
Lead Channel Pacing Threshold Pulse Width: 1 ms
Lead Channel Sensing Intrinsic Amplitude: 12 mV
Lead Channel Setting Pacing Amplitude: 4 V
Lead Channel Setting Pacing Pulse Width: 1 ms
Lead Channel Setting Sensing Sensitivity: 0.5 mV
Pulse Gen Serial Number: 615318

## 2019-02-11 MED ORDER — FLUTICASONE-SALMETEROL 250-50 MCG/DOSE IN AEPB: 1.0000 | INHALATION_SPRAY | Freq: Two times a day (BID) | RESPIRATORY_TRACT | 11 refills | Status: DC

## 2019-02-11 MED ORDER — METHYLPREDNISOLONE ACETATE 80 MG/ML IJ SUSP
80.0000 mg | Freq: Once | INTRAMUSCULAR | Status: AC
  Administered 2019-02-11: 80 mg via INTRAMUSCULAR

## 2019-02-11 MED ORDER — PREDNISONE 10 MG PO TABS: 10.0000 mg | ORAL_TABLET | Freq: Every day | ORAL | 0 refills | Status: AC

## 2019-02-11 MED ORDER — ALBUTEROL SULFATE HFA 108 (90 BASE) MCG/ACT IN AERS: 1.0000 | INHALATION_SPRAY | Freq: Four times a day (QID) | RESPIRATORY_TRACT | 11 refills | Status: DC | PRN

## 2019-02-11 NOTE — Progress Notes (Signed)
Subjective:    Patient ID: Jason Hogan, male    DOB: [birth year], 42 y.o.   MRN: [id number]  HPI  Here to f/u with 1 mo onset mild worsening wheezing, sob, doe and non prod cough but Pt denies chest pain, orthopnea, PND, increased LE swelling, palpitations, dizziness or syncope.  Symbicort just too expensive without insurance so never started.  COVID neg x 2, with last neg last Friday.  Now using inhaler 5-6 times per day, has gained wt with pandemic recently as well.  Pt denies new neurological symptoms such as new headache, or facial or extremity weakness or numbness   Pt denies polydipsia, polyuria, or low sugar symptoms such as weakness or confusion improved with po intake.  Pt states overall good compliance with meds, trying to follow lower cholesterol, diabetic diet, wt overall stable but little exercise however.    Past Medical History:  Diagnosis Date  . Acute Myocardial Infarction 08/18/2009   Qualifier: Diagnosis of  By: Kem Parkinson    . Allergic rhinitis, cause unspecified 10/03/2011  . Anxiety 10/03/2011  . Asthma 09/20/2006   Qualifier: Diagnosis of  By: Jonny Ruiz MD, Len Blalock   . 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 Qualifier: Diagnosis of  By: Eden Emms, MD, Harrington Challenger   . CAD (coronary artery disease)    a. large anterior MI 2011 s/p PTCA/DES to 100%. b. occluded LAD with occluded collateralized silent RCA, balloon angioplasty to stented segment of LAD and DES x1 to mid circumflex in 10/2017.  [name] Cholelithiasis 06/22/2007   Qualifier: Diagnosis of  By: Maris Berger   . Chronic systolic heart failure (HCC) 09/18/2009   Qualifier: Diagnosis of  By: Kem Parkinson    . Depressive disorder, not elsewhere classified 06/22/2007   Qualifier: Diagnosis of  By: Maris Berger   . History of gout   . History of kidney stones   . Hyperlipidemia 81/2011  . Hypertension   . Impaired glucose  tolerance 10/05/2012  . Insomnia, unspecified 06/22/2007   Qualifier: Diagnosis of  By: Jonny Ruiz MD, Len Blalock   . Ischemic cardiomyopathy 10/20/2009   Qualifier: Diagnosis of  By: Graciela Husbands, MD, [facility], Ty Hilts   . 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 Migraine Headache 09/18/2009   Qualifier: Diagnosis of  By: Kem Parkinson    . Renal calculus   . Renal Insufficiency 09/18/2009   Qualifier: Diagnosis of  By: Kem Parkinson    . Respiratory failure (HCC) 08/2009  . Rhabdomyolysis 09/18/2009   Qualifier: Diagnosis of  By: Kem Parkinson    . VT (ventricular tachycardia) (HCC)    Past Surgical History:  Procedure Laterality Date  . CARDIAC CATHETERIZATION N/A 05/17/2015   Procedure: Left Heart Cath and Coronary Angiography;  Surgeon: Wendall Stade, MD;  [facility] INVASIVE CV LAB;  Service: Cardiovascular;  Laterality: N/A;  . CARDIAC CATHETERIZATION  2012  . CORONARY ANGIOPLASTY WITH STENT PLACEMENT  2011  . CORONARY STENT INTERVENTION N/A 11/14/2017   Procedure: CORONARY STENT INTERVENTION;  Surgeon: Kathleene Hazel, MD;  Location: [facility] INVASIVE CV LAB;  Service: Cardiovascular;  Laterality: N/A;  . ICD IMPLANT  11/2009   "w/pacing function"  . RIGHT/LEFT HEART CATH AND CORONARY ANGIOGRAPHY N/A 11/14/2017   Procedure: RIGHT/LEFT HEART CATH AND CORONARY ANGIOGRAPHY;  Surgeon: Kathleene Hazel, MD;  Location: [facility] INVASIVE CV LAB;  Service: Cardiovascular;  Laterality: N/A;    reports that he quit smoking about 25  years ago. His smoking use included cigarettes. He has a 0.20 pack-year smoking history. He has never used smokeless tobacco. He reports current alcohol use. He reports that he does not use drugs. family history includes Diabetes in an other family member; Heart attack in his father; Heart disease in an other family member; Hypothyroidism in an other family member. Allergies  Allergen Reactions  . Levaquin [Levofloxacin] Other (See Comments)    Joint pain  . Ace  Inhibitors Cough  . Crestor [Rosuvastatin] Other (See Comments)    "Sweet cravings"  . Lunesta [Eszopiclone] Other (See Comments)    "Bitter taste in mouth"  . Zocor [Simvastatin] Other (See Comments)    Memory problem   Current Outpatient Medications on File Prior to Visit  Medication Sig Dispense Refill  . ALPRAZolam (XANAX) 0.5 MG tablet TAKE 1 TABLET (0.5 MG TOTAL) BY MOUTH DAILY AS NEEDED FOR ANXIETY. 30 tablet 2  . anastrozole (ARIMIDEX) 1 MG tablet Take 1 mg by mouth daily.    [name] aspirin EC 81 MG tablet Take 81 mg by mouth daily.    . carvedilol (COREG) 6.25 MG tablet TAKE 1 TABLET BY MOUTH TWICE A DAY 180 tablet 2  . Coenzyme Q10 (CO Q 10) 100 MG CAPS Take 100 mg by mouth daily.     . Evolocumab (REPATHA SURECLICK) 140 MG/ML SOAJ Inject 1 pen into the skin every 14 (fourteen) days. 2 pen 11  . ezetimibe (ZETIA) 10 MG tablet Take 1 tablet (10 mg total) by mouth daily. 30 tablet 11  . KLOR-CON M20 20 MEQ tablet TAKE 2 TABLETS BY MOUTH EVERY DAY 180 tablet 3  . metolazone (ZAROXOLYN) 5 MG tablet TAKE 1 TABLET (5 MG TOTAL) BY MOUTH DAILY AS NEEDED (FOR EDEMA). 30 tablet 9  . mexiletine (MEXITIL) 200 MG capsule TAKE 1 CAPSULE BY MOUTH TWICE A DAY 30 capsule 9  . prasugrel (EFFIENT) 10 MG TABS tablet Take 1 tablet (10 mg total) by mouth daily. 90 tablet 3  . sacubitril-valsartan (ENTRESTO) 49-51 MG Take 1 tablet by mouth 2 (two) times daily. 60 tablet 5  . spironolactone (ALDACTONE) 25 MG tablet Take 0.5 tablets (12.5 mg total) by mouth daily. 45 tablet 3  . SYMBICORT 80-4.5 MCG/ACT inhaler INHALE 2 PUFFS INTO THE LUNGS 2 (TWO) TIMES DAILY AS NEEDED (WHEEZING). 10.2 Inhaler 5  . tiZANidine (ZANAFLEX) 4 MG tablet TAKE 1 TABLET BY MOUTH TWICE A DAY AS NEEDED 60 tablet 2  . torsemide (DEMADEX) 20 MG tablet Take 1 tab by mouth every 3 days 30 tablet 2  . traMADol (ULTRAM) 50 MG tablet Take 1 tablet (50 mg total) by mouth every 12 (twelve) hours as needed. 30 tablet 0  . Vitamin D,  Ergocalciferol, (DRISDOL) 1.25 MG (50000 UT) CAPS capsule TAKE 1 CAPSULE (50,000 UNITS TOTAL) BY MOUTH EVERY 7 (SEVEN) DAYS. 12 capsule 0  . nitroGLYCERIN (NITROSTAT) 0.4 MG SL tablet Place 1 tablet (0.4 mg total) under the tongue every 5 (five) minutes as needed for chest pain. 90 tablet 3   No current facility-administered medications on file prior to visit.   Review of Systems  Constitutional: Negative for other unusual diaphoresis or sweats HENT: Negative for ear discharge or swelling Eyes: Negative for other worsening visual disturbances Respiratory: Negative for stridor or other swelling  Gastrointestinal: Negative for worsening distension or other blood Genitourinary: Negative for retention or other urinary change Musculoskeletal: Negative for other MSK pain or swelling Skin: Negative for color change or other new  lesions Neurological: Negative for worsening tremors and other numbness  Psychiatric/Behavioral: Negative for worsening agitation or other fatigue All otherwise neg per pt     Objective:   Physical Exam BP 128/82 (BP Location: Left Arm)   Pulse 81   Temp 98.9 F (37.2 C) (Oral)   Wt 225 lb 9.6 oz (102.3 kg)   SpO2 95%   BMI 28.97 kg/m  VS noted, non toxic Constitutional: Pt appears in NAD HENT: Head: NCAT.  Right Ear: External ear normal.  Left Ear: External ear normal.  Eyes: . Pupils are equal, round, and reactive to light. Conjunctivae and EOM are normal Nose: without d/c or deformity Neck: Neck supple. Gross normal ROM Cardiovascular: Normal rate and regular rhythm.   Pulmonary/Chest: Effort normal and breath sounds decreased without rales or wheezing.  Abd:  Soft, NT, ND, + BS, no organomegaly Neurological: Pt is alert. At baseline orientation, motor grossly intact Skin: Skin is warm. No rashes, other new lesions, no LE edema Psychiatric: Pt behavior is normal without agitation  All otherwise neg per pt  Lab Results  Component Value Date   WBC 6.4  09/06/2018   HGB 15.9 09/06/2018   HCT 49.7 09/06/2018   PLT 194 09/06/2018   GLUCOSE 83 11/27/2018   CHOL 135 01/22/2019   TRIG 86 01/22/2019   HDL 40 01/22/2019   LDLDIRECT 101.0 10/24/2015   LDLCALC 78 01/22/2019   ALT 48 (H) 01/22/2019   AST 37 01/22/2019   NA 139 11/27/2018   K 4.2 11/27/2018   CL 105 11/27/2018   CREATININE 1.03 11/27/2018   BUN 7 11/27/2018   CO2 26 11/27/2018   TSH 1.640 08/19/2018   PSA 0.61 07/01/2017   INR 1.1 (H) 08/01/2016   HGBA1C 5.0 07/04/2016       Assessment & Plan:

## 2019-02-11 NOTE — Patient Instructions (Signed)
You had the steroid shot today  Please take all new medication as prescribed - the prednisone  Please continue all other medications as before, and refills have been done if requested - the albuterol inhaler  Please take all new medication as prescribed - the generic Advair inhaler if not too expensive  Please have the pharmacy call with any other refills you may need.  Please continue your efforts at being more active, low cholesterol diet, and weight control.  Please keep your appointments with your specialists as you may have planned

## 2019-02-14 ENCOUNTER — Encounter: Payer: Self-pay | Admitting: Internal Medicine

## 2019-02-14 NOTE — Assessment & Plan Note (Signed)
stable overall by history and exam, recent data reviewed with pt, and pt to continue medical treatment as before,  to f/u any worsening symptoms or concerns  

## 2019-02-14 NOTE — Assessment & Plan Note (Signed)
With mild recent worsening, likely seasonal allergic related, for depomedrol IM 80, predpac asd, albuterol MDI prn, and change symbicort to wixhaler asd,  to f/u any worsening symptoms or concerns

## 2019-02-22 ENCOUNTER — Encounter: Payer: Self-pay | Admitting: Family Medicine

## 2019-02-25 ENCOUNTER — Ambulatory Visit (INDEPENDENT_AMBULATORY_CARE_PROVIDER_SITE_OTHER): Payer: Self-pay

## 2019-02-25 ENCOUNTER — Other Ambulatory Visit: Payer: Self-pay

## 2019-02-25 ENCOUNTER — Encounter: Payer: Self-pay | Admitting: Family Medicine

## 2019-02-25 ENCOUNTER — Ambulatory Visit (INDEPENDENT_AMBULATORY_CARE_PROVIDER_SITE_OTHER): Payer: Self-pay | Admitting: Family Medicine

## 2019-02-25 VITALS — BP 130/98 | HR 91 | Ht 74.0 in | Wt 225.0 lb

## 2019-02-25 DIAGNOSIS — M25532 Pain in left wrist: Secondary | ICD-10-CM

## 2019-02-25 DIAGNOSIS — M722 Plantar fascial fibromatosis: Secondary | ICD-10-CM

## 2019-02-25 DIAGNOSIS — M10271 Drug-induced gout, right ankle and foot: Secondary | ICD-10-CM

## 2019-02-25 DIAGNOSIS — M7551 Bursitis of right shoulder: Secondary | ICD-10-CM

## 2019-02-25 NOTE — Assessment & Plan Note (Signed)
History of this with also a partial bicep tendon tear.  Patient is going to continue with conservative therapy but discussed the possibility of PRP.  Patient wants to avoid any surgical intervention if it was good.  Follow-up with me again in 4 weeks.

## 2019-02-25 NOTE — Assessment & Plan Note (Signed)
We reviewed that stretching is critically important to the treatment of PF.  Reviewed footwear. Rigid soles have been shown to help with PF. Night splints can sometimes help. Reviewed rehab of stretching and calf raises.   Could benefit from a corticosteroid injection, orthotics, or other measures if conservative treatment fails. Discussed recovery signals in the house as well.  Worsening symptoms can consider injection but hopefully patient will do well.

## 2019-02-25 NOTE — Assessment & Plan Note (Signed)
Concern the patient's wrist pain is more secondary to gout.  Seems to be resolving.  Patient wants to try the natural approach.  Discussed over-the-counter medications that could be potentially helpful.  Discussed icing regimen and home exercises.  Discussed hydration.  Follow-up again 4 weeks

## 2019-02-25 NOTE — Progress Notes (Signed)
[name] [address] [place] [address] Phone: [phone] Subjective:   Jason Hogan, am serving as a scribe for Dr. Antoine Primas. This visit occurred during the SARS-CoV-2 public health emergency.  Safety protocols were in place, including screening questions prior to the visit, additional usage of staff PPE, and extensive cleaning of exam room while observing appropriate contact time as indicated for disinfecting solutions.   I'm seeing this patient by the request  of:    CC: Wrist pain  [id number]   03/09/2018 Hold at moment but likely going to have a secondary injury to the TFCC.  Patient will increase activity slowly.  We discussed the possibility of PRP injections if needed.  Patient will brace occasionally.  Increase activity slowly.  Follow-up again 6 to 8 weeks  Update 02/25/2019 Jason Hogan is a 44 y.o. male coming in with complaint of left wrist pain. Pain over posterior aspect of carpal bones. Pain started on Sunday. Monday the pain got worse but the pain is improving today. Did use brace to mobilize wrist. Did work on his motorcycle on Saturday but is right handed so did most of the work with that hand. Patient has had gout before.  Does not know if this is a flare or not.  Has discontinued all the medications he was taking for that.  Patient is also has some foot pain.  Seems to be left-sided.  Worse after sitting for long amount of time seems to be in the heel and then gets better with activity.  For steps in the morning is worsening.  Has been walking more.  Trying to work out more to.  Shoulder pain right side, has had a biceps subluxation with partial tearing.  Continues to have intermittent pain.  Wanting to know if there is anything else and wants to avoid any surgical intervention     Past Medical History:  Diagnosis Date  . Acute Myocardial Infarction 08/18/2009   Qualifier: Diagnosis of  By: Kem Parkinson    .  Allergic rhinitis, cause unspecified 10/03/2011  . Anxiety 10/03/2011  . Asthma 09/20/2006   Qualifier: Diagnosis of  By: Jonny Ruiz MD, Len Blalock   . 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 Qualifier: Diagnosis of  By: Eden Emms, MD, Harrington Challenger   . CAD (coronary artery disease)    a. large anterior MI 2011 s/p PTCA/DES to 100%. b. occluded LAD with occluded collateralized silent RCA, balloon angioplasty to stented segment of LAD and DES x1 to mid circumflex in 10/2017.  [name] Cholelithiasis 06/22/2007   Qualifier: Diagnosis of  By: Maris Berger   . Chronic systolic heart failure (HCC) 09/18/2009   Qualifier: Diagnosis of  By: Kem Parkinson    . Depressive disorder, not elsewhere classified 06/22/2007   Qualifier: Diagnosis of  By: Maris Berger   . History of gout   . History of kidney stones   . Hyperlipidemia 81/2011  . Hypertension   . Impaired glucose tolerance 10/05/2012  . Insomnia, unspecified 06/22/2007   Qualifier: Diagnosis of  By: Jonny Ruiz MD, Len Blalock   . Ischemic cardiomyopathy 10/20/2009   Qualifier: Diagnosis of  By: Graciela Husbands, MD, [facility], Ty Hilts   . 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 Migraine Headache 09/18/2009   Qualifier: Diagnosis of  By: Kem Parkinson    . Renal calculus   . Renal Insufficiency 09/18/2009   Qualifier: Diagnosis of  By: Zachery Dauer,  Kimal[employer]is    . Respiratory failure (HCC) 08/2009  . Rhabdomyolysis 09/18/2009   Qualifier: Diagnosis of  By: Kem Parkinson    . VT (ventricular tachycardia) (HCC)    Past Surgical History:  Procedure Laterality Date  . CARDIAC CATHETERIZATION N/A 05/17/2015   Procedure: Left Heart Cath and Coronary Angiography;  Surgeon: Wendall Stade, MD;  Location: [facility] INVASIVE CV LAB;  Service: Cardiovascular;  Laterality: N/A;  . CARDIAC CATHETERIZATION  2012  . CORONARY ANGIOPLASTY WITH STENT PLACEMENT  2011  . CORONARY STENT INTERVENTION N/A 11/14/2017    Procedure: CORONARY STENT INTERVENTION;  Surgeon: Kathleene Hazel, MD;  Location: [facility] INVASIVE CV LAB;  Service: Cardiovascular;  Laterality: N/A;  . ICD IMPLANT  11/2009   "w/pacing function"  . RIGHT/LEFT HEART CATH AND CORONARY ANGIOGRAPHY N/A 11/14/2017   Procedure: RIGHT/LEFT HEART CATH AND CORONARY ANGIOGRAPHY;  Surgeon: Kathleene Hazel, MD;  Location: [facility] INVASIVE CV LAB;  Service: Cardiovascular;  Laterality: N/A;   Social History   Socioeconomic History  . Marital status: Married    Spouse name: Not on file  . Number of children: 0  . Years of education: Not on file  . Highest education level: Not on file  Occupational History  . 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 Smoking status: Former Smoker    Packs/day: 0.10    Years: 2.00    Pack years: 0.20    Types: Cigarettes    Quit date: 02/18/1994    Years since quitting: 25.0  . Smokeless tobacco: Never Used  Substance and Sexual Activity  . Alcohol use: Yes    Comment: 11/25/2017 "couple beers/month; if that"  . Drug use: Never  . Sexual activity: Yes  Other Topics Concern  . Not on file  Social History Narrative  . Not on file   Social Determinants of Health   Financial Resource Strain:   . Difficulty of Paying Living Expenses: Not on file  Food Insecurity:   . Worried About Programme researcher, broadcasting/film/video in the Last Year: Not on file  . Ran Out of Food in the Last Year: Not on file  Transportation Needs:   . Lack of Transportation (Medical): Not on file  . Lack of Transportation (Non-Medical): Not on file  Physical Activity:   . Days of Exercise per Week: Not on file  . Minutes of Exercise per Session: Not on file  Stress:   . Feeling of Stress : Not on file  Social Connections:   . Frequency of Communication with Friends and Family: Not on file  . Frequency of Social Gatherings with Friends and Family: Not on file  . Attends Religious Services: Not on file  . Active Member of  Clubs or Organizations: Not on file  . Attends Banker Meetings: Not on file  . Marital Status: Not on file   Allergies  Allergen Reactions  . Levaquin [Levofloxacin] Other (See Comments)    Joint pain  . Ace Inhibitors Cough  . Crestor [Rosuvastatin] Other (See Comments)    "Sweet cravings"  . Lunesta [Eszopiclone] Other (See Comments)    "Bitter taste in mouth"  . Zocor [Simvastatin] Other (See Comments)    Memory problem   Family History  Problem Relation Age of Onset  . Heart attack Father   . Heart disease Other   . Diabetes Other   . Hypothyroidism Other      Current Outpatient Medications (Cardiovascular):  .  carvedilol (COREG) 6.25 MG tablet, TAKE 1 TABLET BY MOUTH TWICE A DAY .  Evolocumab (REPATHA SURECLICK) 784 MG/ML SOAJ, Inject 1 pen into the skin every 14 (fourteen) days. [name]  ezetimibe (ZETIA) 10 MG tablet, Take 1 tablet (10 mg total) by mouth daily. .  metolazone (ZAROXOLYN) 5 MG tablet, TAKE 1 TABLET (5 MG TOTAL) BY MOUTH DAILY AS NEEDED (FOR EDEMA). .  mexiletine (MEXITIL) 200 MG capsule, TAKE 1 CAPSULE BY MOUTH TWICE A DAY .  nitroGLYCERIN (NITROSTAT) 0.4 MG SL tablet, Place 1 tablet (0.4 mg total) under the tongue every 5 (five) minutes as needed for chest pain. .  sacubitril-valsartan (ENTRESTO) 49-51 MG, Take 1 tablet by mouth 2 (two) times daily. [name]  spironolactone (ALDACTONE) 25 MG tablet, Take 0.5 tablets (12.5 mg total) by mouth daily. [name]  torsemide (DEMADEX) 20 MG tablet, Take 1 tab by mouth every 3 days  Current Outpatient Medications (Respiratory):  .  albuterol (VENTOLIN HFA) 108 (90 Base) MCG/ACT inhaler, Inhale 1-2 puffs into the lungs every 6 (six) hours as needed for wheezing or shortness of breath. .  Fluticasone-Salmeterol (WIXELA INHUB) 250-50 MCG/DOSE AEPB, Inhale 1 puff into the lungs 2 (two) times daily. .  SYMBICORT 80-4.5 MCG/ACT inhaler, INHALE 2 PUFFS INTO THE LUNGS 2 (TWO) TIMES DAILY AS NEEDED (WHEEZING).  Current  Outpatient Medications (Analgesics):  .  aspirin EC 81 MG tablet, Take 81 mg by mouth daily. .  traMADol (ULTRAM) 50 MG tablet, Take 1 tablet (50 mg total) by mouth every 12 (twelve) hours as needed.  Current Outpatient Medications (Hematological):  .  prasugrel (EFFIENT) 10 MG TABS tablet, Take 1 tablet (10 mg total) by mouth daily.  Current Outpatient Medications (Other):  [name]  ALPRAZolam (XANAX) 0.5 MG tablet, TAKE 1 TABLET (0.5 MG TOTAL) BY MOUTH DAILY AS NEEDED FOR ANXIETY. [name]  anastrozole (ARIMIDEX) 1 MG tablet, Take 1 mg by mouth daily. .  Coenzyme Q10 (CO Q 10) 100 MG CAPS, Take 100 mg by mouth daily.  [name]  KLOR-CON M20 20 MEQ tablet, TAKE 2 TABLETS BY MOUTH EVERY DAY .  tiZANidine (ZANAFLEX) 4 MG tablet, TAKE 1 TABLET BY MOUTH TWICE A DAY AS NEEDED .  Vitamin D, Ergocalciferol, (DRISDOL) 1.25 MG (50000 UT) CAPS capsule, TAKE 1 CAPSULE (50,000 UNITS TOTAL) BY MOUTH EVERY 7 (SEVEN) DAYS.    Past medical history, social, surgical and family history all reviewed in electronic medical record.  No pertanent information unless stated regarding to the chief complaint.   Review of Systems:  No headache, visual changes, nausea, vomiting, diarrhea, constipation, dizziness, abdominal pain, skin rash, fevers, chills, night sweats, weight loss, swollen lymph nodes, body aches, joint swelling, muscle aches, chest pain, shortness of breath, mood changes.   Objective  Blood pressure (!) 130/98, pulse 91, height 6\' 2"  (1.88 m), weight 225 lb (102.1 kg), SpO2 96 %. Systems examined below as of    General: No apparent distress alert and oriented x3 mood and affect normal, dressed appropriately.  HEENT: Pupils equal, extraocular movements intact  Respiratory: Patient's speak in full sentences and does not appear short of breath  Cardiovascular: No lower extremity edema, non tender, no erythema  Skin: Warm dry intact with no signs of infection or rash on extremities or on axial skeleton.  Abdomen: Soft  nontender  Neuro: Cranial nerves II through XII are intact, neurovascularly intact in all extremities with 2+ DTRs and 2+ pulses.  Lymph: No lymphadenopathy of posterior or anterior cervical chain or axillae  bilaterally.  Gait normal with good balance and coordination.  MSK:   Foot exam bilaterally shows the patient has significant pes planus.  Left heel does have pain palpation over the medial calcaneal area otherwise fairly unremarkable.  Does have overpronation on the hindfoot left greater than right.  Patient left wrist there is no significant swelling at the moment.  Patient is tender to palpation over the lunate bone.  Full range of motion.  Some mild pain with stressing of the TFCC good grip strength.  Neurovascular intact distally.  Right shoulder is mild positive Speed sign mild positive impingement but otherwise fairly unremarkable.  Limited musculoskeletal ultrasound was performed and interpreted Judi Saa  Limited ultrasound of patient's left wrist shows the patient does have what appears to be possible uric acid deposits in the TFCC noted.  Mild swelling noted.   Mild hypoechoic changes in the area that could be a small effusion.  This is between the scaphoid lunate joint Impression: Some mild degenerative changes but appears to be more of a uric acid deposits in the TFCC    Impression and Recommendations:     This case required medical decision making of moderate complexity. The above documentation has been reviewed and is accurate and complete Judi Saa, DO       Note: This dictation was prepared with Dragon dictation along with smaller phrase technology. Any transcriptional errors that result from this process are unintentional.

## 2019-02-25 NOTE — Patient Instructions (Addendum)
Tart cherry extract 1200mg  nightly Spenco orthotics Total Support Arm compression sleeve Hoka or OOFOS recovery sandals PRP -let me know See me again in 4 weeks

## 2019-03-24 ENCOUNTER — Encounter: Payer: Self-pay | Admitting: Family Medicine

## 2019-03-25 ENCOUNTER — Encounter: Payer: Self-pay | Admitting: Family Medicine

## 2019-03-25 ENCOUNTER — Ambulatory Visit (INDEPENDENT_AMBULATORY_CARE_PROVIDER_SITE_OTHER): Payer: 59

## 2019-03-25 ENCOUNTER — Other Ambulatory Visit: Payer: Self-pay

## 2019-03-25 ENCOUNTER — Ambulatory Visit: Payer: Self-pay | Admitting: Family Medicine

## 2019-03-25 VITALS — BP 122/78 | HR 72 | Ht 74.0 in | Wt 225.0 lb

## 2019-03-25 DIAGNOSIS — G8929 Other chronic pain: Secondary | ICD-10-CM

## 2019-03-25 DIAGNOSIS — M25511 Pain in right shoulder: Secondary | ICD-10-CM | POA: Diagnosis not present

## 2019-03-25 DIAGNOSIS — S43431A Superior glenoid labrum lesion of right shoulder, initial encounter: Secondary | ICD-10-CM

## 2019-03-25 NOTE — Patient Instructions (Signed)
PRP today No ice or IBU for 3 days See me again in 3 weeks for the wrist

## 2019-03-25 NOTE — Assessment & Plan Note (Signed)
Patient had PRP injection today.  Tolerated procedure well.  Went for the anterior aspect.  Hopefully that this will be beneficial.  Discussed icing regimen.  Topical anti-inflammatories.  Patient will continue with the other kind of medications at the moment.  We discussed the idea of avoiding icing and anti-inflammatories.  Follow-up with me again 2 to 3 weeks.

## 2019-03-25 NOTE — Progress Notes (Signed)
[facility] Hogan [place] Phone: [phone] Subjective:   Jason Hogan, am serving as a scribe for Dr. Hulan Saas. This visit occurred during the SARS-CoV-2 public health emergency.  Safety protocols were in place, including screening questions prior to the visit, additional usage of staff PPE, and extensive cleaning of exam room while observing appropriate contact time as indicated for disinfecting solutions.   I'm seeing this patient by the request  of:  Jason Borg, MD  CC: Right shoulder pain follow-up  [id number]   02/25/2019 History of this with also a partial bicep tendon tear.  Patient is going to continue with conservative therapy but discussed the possibility of PRP.  Patient wants to avoid any surgical intervention if it was good.  Follow-up with me again in 4 weeks.  Update 03/25/2019 Jason Hogan is a 44 y.o. male coming in with complaint of right shoulder bursitis. Patient is here for a PRP injection today. Patient states that his shoulder pain has gotten better with modifications in the gym. Patient has been getting migraines in past 3 weeks and feels a trigger point on the right shoulder is causing tightness that is leading to his migraines.    Patient did have medial displacement of the long head of the bicep with a partial-thickness labral tear and partial-thickness subscapularis tendon tear anteriorly.  This was a CT scan independently visualized by me from 2016  Past Medical History:  Diagnosis Date  . Acute Myocardial Infarction 08/18/2009   Qualifier: Diagnosis of  By: [name]    . Allergic rhinitis, cause unspecified 10/03/2011  . Anxiety 10/03/2011  . Asthma 09/20/2006   Qualifier: Diagnosis of  By: [name], [name]   . 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 Qualifier: Diagnosis of  By: [name], MD, [name]   . CAD  (coronary artery disease)    a. large anterior MI 2011 s/p PTCA/DES to 100%. b. occluded LAD with occluded collateralized silent RCA, balloon angioplasty to stented segment of LAD and DES x1 to mid circumflex in 10/2017.  [name] Cholelithiasis 06/22/2007   Qualifier: Diagnosis of  By: [name]   . Chronic systolic heart failure ([place]) 09/18/2009   Qualifier: Diagnosis of  By: [name]    . Depressive disorder, not elsewhere classified 06/22/2007   Qualifier: Diagnosis of  By: [name]   . History of gout   . History of kidney stones   . Hyperlipidemia 81/2011  . Hypertension   . Impaired glucose tolerance 10/05/2012  . Insomnia, unspecified 06/22/2007   Qualifier: Diagnosis of  By: [name], [name]   . Ischemic cardiomyopathy 10/20/2009   Qualifier: Diagnosis of  By: [name], MD, [facility], Mack Guise   . 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 Migraine Headache 09/18/2009   Qualifier: Diagnosis of  By: [name]    . Renal calculus   . Renal Insufficiency 09/18/2009   Qualifier: Diagnosis of  By: [name]    . Respiratory failure ([place]) 08/2009  . Rhabdomyolysis 09/18/2009   Qualifier: Diagnosis of  By: [name]    . VT (ventricular tachycardia) ([place])    Past Surgical History:  Procedure Laterality Date  . CARDIAC CATHETERIZATION N/A 05/17/2015   Procedure: Left Heart Cath and Coronary Angiography;  Surgeon: [name], MD;  Location: [facility] CV [facility];  Service: Cardiovascular;  Laterality: N/A;  . CARDIAC  CATHETERIZATION  2012  . CORONARY ANGIOPLASTY WITH STENT PLACEMENT  2011  . CORONARY STENT INTERVENTION N/A 11/14/2017   Procedure: CORONARY STENT INTERVENTION;  Surgeon: Kathleene Hazel, MD;  Location: [facility] INVASIVE CV LAB;  Service: Cardiovascular;  Laterality: N/A;  . ICD IMPLANT  11/2009   "w/pacing function"  . RIGHT/LEFT HEART CATH AND CORONARY ANGIOGRAPHY N/A 11/14/2017   Procedure: RIGHT/LEFT HEART CATH AND CORONARY  ANGIOGRAPHY;  Surgeon: Kathleene Hazel, MD;  Location: [facility] INVASIVE CV LAB;  Service: Cardiovascular;  Laterality: N/A;   Social History   Socioeconomic History  . Marital status: Married    Spouse name: Not on file  . Number of children: 0  . Years of education: Not on file  . Highest education level: Not on file  Occupational History  . 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 Smoking status: Former Smoker    Packs/day: 0.10    Years: 2.00    Pack years: 0.20    Types: Cigarettes    Quit date: 02/18/1994    Years since quitting: 25.1  . Smokeless tobacco: Never Used  Substance and Sexual Activity  . Alcohol use: Yes    Comment: 11/25/2017 "couple beers/month; if that"  . Drug use: Never  . Sexual activity: Yes  Other Topics Concern  . Not on file  Social History Narrative  . Not on file   Social Determinants of Health   Financial Resource Strain:   . Difficulty of Paying Living Expenses: Not on file  Food Insecurity:   . Worried About Programme researcher, broadcasting/film/video in the Last Year: Not on file  . Ran Out of Food in the Last Year: Not on file  Transportation Needs:   . Lack of Transportation (Medical): Not on file  . Lack of Transportation (Non-Medical): Not on file  Physical Activity:   . Days of Exercise per Week: Not on file  . Minutes of Exercise per Session: Not on file  Stress:   . Feeling of Stress : Not on file  Social Connections:   . Frequency of Communication with Friends and Family: Not on file  . Frequency of Social Gatherings with Friends and Family: Not on file  . Attends Religious Services: Not on file  . Active Member of Clubs or Organizations: Not on file  . Attends Banker Meetings: Not on file  . Marital Status: Not on file   Allergies  Allergen Reactions  . Levaquin [Levofloxacin] Other (See Comments)    Joint pain  . Ace Inhibitors Cough  . Crestor [Rosuvastatin] Other (See Comments)    "Sweet cravings"    . Lunesta [Eszopiclone] Other (See Comments)    "Bitter taste in mouth"  . Zocor [Simvastatin] Other (See Comments)    Memory problem   Family History  Problem Relation Age of Onset  . Heart attack Father   . Heart disease Other   . Diabetes Other   . Hypothyroidism Other      Current Outpatient Medications (Cardiovascular):  .  carvedilol (COREG) 6.25 MG tablet, TAKE 1 TABLET BY MOUTH TWICE A DAY .  Evolocumab (REPATHA SURECLICK) 140 MG/ML SOAJ, Inject 1 pen into the skin every 14 (fourteen) days. [name]  ezetimibe (ZETIA) 10 MG tablet, Take 1 tablet (10 mg total) by mouth daily. .  metolazone (ZAROXOLYN) 5 MG tablet, TAKE 1 TABLET (5 MG TOTAL) BY MOUTH DAILY AS NEEDED (FOR EDEMA). .  mexiletine (MEXITIL) 200 MG capsule,  TAKE 1 CAPSULE BY MOUTH TWICE A DAY .  sacubitril-valsartan (ENTRESTO) 49-51 MG, Take 1 tablet by mouth 2 (two) times daily. [name]  spironolactone (ALDACTONE) 25 MG tablet, Take 0.5 tablets (12.5 mg total) by mouth daily. [name]  torsemide (DEMADEX) 20 MG tablet, Take 1 tab by mouth every 3 days .  nitroGLYCERIN (NITROSTAT) 0.4 MG SL tablet, Place 1 tablet (0.4 mg total) under the tongue every 5 (five) minutes as needed for chest pain.  Current Outpatient Medications (Respiratory):  .  albuterol (VENTOLIN HFA) 108 (90 Base) MCG/ACT inhaler, Inhale 1-2 puffs into the lungs every 6 (six) hours as needed for wheezing or shortness of breath. .  Fluticasone-Salmeterol (WIXELA INHUB) 250-50 MCG/DOSE AEPB, Inhale 1 puff into the lungs 2 (two) times daily. .  SYMBICORT 80-4.5 MCG/ACT inhaler, INHALE 2 PUFFS INTO THE LUNGS 2 (TWO) TIMES DAILY AS NEEDED (WHEEZING).  Current Outpatient Medications (Analgesics):  .  aspirin EC 81 MG tablet, Take 81 mg by mouth daily. .  traMADol (ULTRAM) 50 MG tablet, Take 1 tablet (50 mg total) by mouth every 12 (twelve) hours as needed.  Current Outpatient Medications (Hematological):  .  prasugrel (EFFIENT) 10 MG TABS tablet, Take 1 tablet (10 mg  total) by mouth daily.  Current Outpatient Medications (Other):  [name]  ALPRAZolam (XANAX) 0.5 MG tablet, TAKE 1 TABLET (0.5 MG TOTAL) BY MOUTH DAILY AS NEEDED FOR ANXIETY. [name]  anastrozole (ARIMIDEX) 1 MG tablet, Take 1 mg by mouth daily. .  Coenzyme Q10 (CO Q 10) 100 MG CAPS, Take 100 mg by mouth daily.  [name]  KLOR-CON M20 20 MEQ tablet, TAKE 2 TABLETS BY MOUTH EVERY DAY .  tiZANidine (ZANAFLEX) 4 MG tablet, TAKE 1 TABLET BY MOUTH TWICE A DAY AS NEEDED .  Vitamin D, Ergocalciferol, (DRISDOL) 1.25 MG (50000 UT) CAPS capsule, TAKE 1 CAPSULE (50,000 UNITS TOTAL) BY MOUTH EVERY 7 (SEVEN) DAYS.   Reviewed prior external information including notes and imaging from  primary care provider As well as notes that were available from care everywhere and other healthcare systems.  Past medical history, social, surgical and family history all reviewed in electronic medical record.  Hogan pertanent information unless stated regarding to the chief complaint.   Review of Systems:  Hogan headache, visual changes, nausea, vomiting, diarrhea, constipation, dizziness, abdominal pain, skin rash, fevers, chills, night sweats, weight loss, swollen lymph nodes, body aches, joint swelling, chest pain, shortness of breath, mood changes. POSITIVE muscle aches  Objective  Blood pressure 122/78, pulse 72, height 6\' 2"  (1.88 m), weight 225 lb (102.1 kg), SpO2 97 %.   General: Hogan apparent distress alert and oriented x3 mood and affect normal, dressed appropriately.  HEENT: Pupils equal, extraocular movements intact  Right shoulder exam still has positive Speed sign.  Positive impingement.  5 out of 5 strength of rotator cuff.  Mild limited range of motion lacking last 5 degrees of external rotation and internal rotation  Procedure: Real-time Ultrasound Guided Injection of right bicep tendon sheath most proximal portion of the anterior labrum Device: GE Logiq Q7  Ultrasound guided injection is preferred based studies that show  increased duration, increased effect, greater accuracy, decreased procedural pain, increased response rate with ultrasound guided versus blind injection.  Verbal informed consent obtained.  Time-out conducted.  Noted Hogan overlying erythema, induration, or other signs of local infection.  Skin prepped in a sterile fashion.  Local anesthesia: Topical Ethyl chloride.  With sterile technique and under real time ultrasound guidance:  Joint  visualized.  23g 1  inch needle inserted posterior approach. Pictures taken for needle placement. Patient did have injection of 2 cc of 0.5% Marcaine, and 4 cc of precentrifuge PRP . Completed without difficulty  Pain immediately resolved suggesting accurate placement of the medication.  Advised to call if fevers/chills, erythema, induration, drainage, or persistent bleeding.  Images permanently stored and available for review in the ultrasound unit.  Impression: Technically successful ultrasound guided injection.    Impression and Recommendations:     This case required medical decision making of moderate complexity. The above documentation has been reviewed and is accurate and complete Judi Saa, DO       Note: This dictation was prepared with Dragon dictation along with smaller phrase technology. Any transcriptional errors that result from this process are unintentional.

## 2019-04-07 ENCOUNTER — Telehealth: Payer: Self-pay

## 2019-04-09 ENCOUNTER — Other Ambulatory Visit: Payer: Self-pay

## 2019-04-09 MED ORDER — NITROGLYCERIN 0.4 MG SL SUBL: 0.4000 mg | SUBLINGUAL_TABLET | SUBLINGUAL | 3 refills | Status: DC | PRN

## 2019-04-15 ENCOUNTER — Telehealth (HOSPITAL_COMMUNITY): Payer: Self-pay

## 2019-04-15 NOTE — Telephone Encounter (Signed)

## 2019-04-16 ENCOUNTER — Ambulatory Visit (HOSPITAL_COMMUNITY)
Admission: RE | Admit: 2019-04-16 | Discharge: 2019-04-16 | Disposition: A | Discharge status: Home / Self Care | Payer: 59 | Source: Ambulatory Visit | Attending: Cardiology | Admitting: Cardiology

## 2019-04-16 ENCOUNTER — Other Ambulatory Visit: Payer: Self-pay

## 2019-04-16 ENCOUNTER — Encounter (HOSPITAL_COMMUNITY): Payer: Self-pay | Admitting: Cardiology

## 2019-04-16 VITALS — BP 140/94 | HR 78 | Wt 225.4 lb

## 2019-04-16 DIAGNOSIS — I255 Ischemic cardiomyopathy: Secondary | ICD-10-CM | POA: Diagnosis not present

## 2019-04-16 DIAGNOSIS — I2510 Atherosclerotic heart disease of native coronary artery without angina pectoris: Secondary | ICD-10-CM | POA: Diagnosis not present

## 2019-04-16 DIAGNOSIS — M791 Myalgia, unspecified site: Secondary | ICD-10-CM | POA: Diagnosis not present

## 2019-04-16 DIAGNOSIS — R079 Chest pain, unspecified: Secondary | ICD-10-CM

## 2019-04-16 DIAGNOSIS — E785 Hyperlipidemia, unspecified: Secondary | ICD-10-CM | POA: Insufficient documentation

## 2019-04-16 DIAGNOSIS — R002 Palpitations: Secondary | ICD-10-CM | POA: Diagnosis not present

## 2019-04-16 DIAGNOSIS — I493 Ventricular premature depolarization: Secondary | ICD-10-CM

## 2019-04-16 DIAGNOSIS — Z95810 Presence of automatic (implantable) cardiac defibrillator: Secondary | ICD-10-CM | POA: Insufficient documentation

## 2019-04-16 DIAGNOSIS — Z7982 Long term (current) use of aspirin: Secondary | ICD-10-CM | POA: Diagnosis not present

## 2019-04-16 DIAGNOSIS — I5022 Chronic systolic (congestive) heart failure: Secondary | ICD-10-CM | POA: Diagnosis not present

## 2019-04-16 DIAGNOSIS — R0789 Other chest pain: Secondary | ICD-10-CM | POA: Insufficient documentation

## 2019-04-16 DIAGNOSIS — I252 Old myocardial infarction: Secondary | ICD-10-CM | POA: Insufficient documentation

## 2019-04-16 DIAGNOSIS — F411 Generalized anxiety disorder: Secondary | ICD-10-CM | POA: Insufficient documentation

## 2019-04-16 DIAGNOSIS — Z955 Presence of coronary angioplasty implant and graft: Secondary | ICD-10-CM | POA: Diagnosis not present

## 2019-04-16 DIAGNOSIS — I110 Hypertensive heart disease with heart failure: Secondary | ICD-10-CM | POA: Diagnosis not present

## 2019-04-16 DIAGNOSIS — Z7902 Long term (current) use of antithrombotics/antiplatelets: Secondary | ICD-10-CM | POA: Diagnosis not present

## 2019-04-16 DIAGNOSIS — Z79899 Other long term (current) drug therapy: Secondary | ICD-10-CM | POA: Diagnosis not present

## 2019-04-16 DIAGNOSIS — J45909 Unspecified asthma, uncomplicated: Secondary | ICD-10-CM | POA: Insufficient documentation

## 2019-04-16 LAB — BASIC METABOLIC PANEL
Anion gap: 9 (ref 5–15)
BUN: 8 mg/dL (ref 6–20)
CO2: 23 mmol/L (ref 22–32)
Calcium: 9.1 mg/dL (ref 8.9–10.3)
Chloride: 104 mmol/L (ref 98–111)
Creatinine, Ser: 1.1 mg/dL (ref 0.61–1.24)
GFR calc Af Amer: >60 mL/min (ref >60)
GFR calc non Af Amer: >60 mL/min (ref >60)
Glucose, Bld: 80 mg/dL (ref 70–99)
Potassium: 4.5 mmol/L (ref 3.5–5.1)
Sodium: 136 mmol/L (ref 135–145)

## 2019-04-16 MED ORDER — CARVEDILOL 6.25 MG PO TABS: 9.3750 mg | ORAL_TABLET | Freq: Two times a day (BID) | ORAL | 6 refills | Status: DC

## 2019-04-16 MED ORDER — SPIRONOLACTONE 25 MG PO TABS: 25.0000 mg | ORAL_TABLET | Freq: Every day | ORAL | 5 refills | Status: DC

## 2019-04-16 MED ORDER — SERTRALINE HCL 25 MG PO TABS: 25.0000 mg | ORAL_TABLET | Freq: Every day | ORAL | 3 refills | Status: DC

## 2019-04-16 NOTE — Patient Instructions (Signed)
INCREASE Coreg to 9.375mg  (1.5 tabs) twice a day   INCREASE Spironolactone to 25mg  (1 tab) daily   START Sertraline 25mg  (1 tab) daily    Labs today and repeat in 10 days  We will only contact you if something comes back abnormal or we need to make some changes. Otherwise no news is good news!  Your physician recommends that you schedule a follow-up appointment in: 1 month with Dr .     You will get a call to schedule an echocardiogram and stress test. See below    Your physician has requested that you have an echocardiogram. Echocardiography is a painless test that uses sound waves to create images of your heart. It provides your doctor with information about the size and shape of your heart and how well your heart's chambers and valves are working. This procedure takes approximately one hour. There are no restrictions for this procedure.   You have been ordered for a stress test. See below for instructions.  How to Prepare for Your Myoview Test (stress test):  1. Please do not take these medications before your test:  Torsemide and Spironolactone 2. Your remaining medications may be taken with water. 3. Nothing to eat or drink, except water, 4 hours prior to arrival time.  NO caffeine/decaffeinated products, or chocolate 12 hours prior to arrival. 4. Ladies, please do not wear dresses.  Skirts or pants are approprate, please wear a short sleeve shirt. 5. NO perfume, cologne or lotion 6. Wear comfortable walking shoes.  NO HEELS! 7. Total time is 3 to 4 hours; you may want to bring reading material for the waiting time. 8. Please report to [facility] for your test  What to expect after you arrive:  Once you arrive and check in for your appointment an IV will be started in your arm.  Then the Technoligist will inject a small amount of radioactive tracer.  There will be a 1 hour waiting period after this injection.  A series of pictures will be taken of your heart  following this waiting period.  You will be prepped for the stress portion of the test.  During the stress portion of your test you will either walk on a treadmill or receive a small, safe amount of radioactive tracer injected in your IV.  After the stress portion, there is a short rest period during which time your heart and blood pressure will be monitored.  After the short rest period the Technologist will begin your second set of pictures.  Your doctor will inform you of your test results within 7-10 business days.  In preparation for your appointment, medication and supplies will be purchased.  Appointment availability is limited, so if you need to cancel or reschedule please call the office at [phone] 24 hours in advance to avoid a cancellation fee of $100.00  IF YOU THINK YOU MAY BE PREGNANT, PLEASE INFORM THE TECHNOLOGIST.
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
## 2019-04-19 ENCOUNTER — Ambulatory Visit: Payer: 59 | Admitting: Internal Medicine

## 2019-04-21 ENCOUNTER — Ambulatory Visit (INDEPENDENT_AMBULATORY_CARE_PROVIDER_SITE_OTHER): Payer: 59

## 2019-04-21 ENCOUNTER — Encounter: Payer: Self-pay | Admitting: Family Medicine

## 2019-04-21 ENCOUNTER — Encounter: Payer: Self-pay | Admitting: Internal Medicine

## 2019-04-21 ENCOUNTER — Telehealth: Payer: Self-pay | Admitting: *Deleted

## 2019-04-21 ENCOUNTER — Ambulatory Visit: Payer: 59 | Admitting: Internal Medicine

## 2019-04-21 ENCOUNTER — Other Ambulatory Visit: Payer: Self-pay

## 2019-04-21 ENCOUNTER — Ambulatory Visit (INDEPENDENT_AMBULATORY_CARE_PROVIDER_SITE_OTHER): Payer: 59 | Admitting: Family Medicine

## 2019-04-21 VITALS — BP 126/78 | HR 84 | Temp 98.1°F | Ht 74.0 in | Wt 224.0 lb

## 2019-04-21 VITALS — BP 118/86 | HR 92 | Ht 74.0 in | Wt 225.0 lb

## 2019-04-21 DIAGNOSIS — M25511 Pain in right shoulder: Secondary | ICD-10-CM

## 2019-04-21 DIAGNOSIS — E559 Vitamin D deficiency, unspecified: Secondary | ICD-10-CM

## 2019-04-21 DIAGNOSIS — G8929 Other chronic pain: Secondary | ICD-10-CM | POA: Diagnosis not present

## 2019-04-21 DIAGNOSIS — R7302 Impaired glucose tolerance (oral): Secondary | ICD-10-CM

## 2019-04-21 DIAGNOSIS — E538 Deficiency of other specified B group vitamins: Secondary | ICD-10-CM

## 2019-04-21 DIAGNOSIS — T7840XD Allergy, unspecified, subsequent encounter: Secondary | ICD-10-CM

## 2019-04-21 DIAGNOSIS — J4531 Mild persistent asthma with (acute) exacerbation: Secondary | ICD-10-CM | POA: Diagnosis not present

## 2019-04-21 DIAGNOSIS — E291 Testicular hypofunction: Secondary | ICD-10-CM | POA: Insufficient documentation

## 2019-04-21 DIAGNOSIS — S43431A Superior glenoid labrum lesion of right shoulder, initial encounter: Secondary | ICD-10-CM

## 2019-04-21 DIAGNOSIS — F419 Anxiety disorder, unspecified: Secondary | ICD-10-CM

## 2019-04-21 DIAGNOSIS — Z0001 Encounter for general adult medical examination with abnormal findings: Secondary | ICD-10-CM

## 2019-04-21 DIAGNOSIS — R8290 Unspecified abnormal findings in urine: Secondary | ICD-10-CM

## 2019-04-21 DIAGNOSIS — T7840XA Allergy, unspecified, initial encounter: Secondary | ICD-10-CM | POA: Insufficient documentation

## 2019-04-21 LAB — CBC WITH DIFFERENTIAL/PLATELET
Basophils Absolute: 0.1 10*3/uL (ref 0.0–0.1)
Basophils Relative: 0.7 % (ref 0.0–3.0)
Eosinophils Absolute: 0.3 10*3/uL (ref 0.0–0.7)
Eosinophils Relative: 4.3 % (ref 0.0–5.0)
HCT: 55 % — ABNORMAL HIGH (ref 39.0–52.0)
Hemoglobin: 18.1 g/dL (ref 13.0–17.0)
Lymphocytes Relative: 25.9 % (ref 12.0–46.0)
Lymphs Abs: 2 10*3/uL (ref 0.7–4.0)
MCHC: 32.9 g/dL (ref 30.0–36.0)
MCV: 83.9 fl (ref 78.0–100.0)
Monocytes Absolute: 0.7 10*3/uL (ref 0.1–1.0)
Monocytes Relative: 9.5 % (ref 3.0–12.0)
Neutro Abs: 4.5 10*3/uL (ref 1.4–7.7)
Neutrophils Relative %: 59.6 % (ref 43.0–77.0)
Platelets: 182 10*3/uL (ref 150.0–400.0)
RBC: 6.56 Mil/uL — ABNORMAL HIGH (ref 4.22–5.81)
RDW: 15.5 % (ref 11.5–15.5)
WBC: 7.6 10*3/uL (ref 4.0–10.5)

## 2019-04-21 LAB — VITAMIN B12: Vitamin B-12: 442 pg/mL (ref 211–911)

## 2019-04-21 LAB — HEPATIC FUNCTION PANEL
ALT: 24 U/L (ref 0–53)
AST: 21 U/L (ref 0–37)
Albumin: 4.6 g/dL (ref 3.5–5.2)
Alkaline Phosphatase: 49 U/L (ref 39–117)
Bilirubin, Direct: 0.2 mg/dL (ref 0.0–0.3)
Total Bilirubin: 0.8 mg/dL (ref 0.2–1.2)
Total Protein: 7.5 g/dL (ref 6.0–8.3)

## 2019-04-21 LAB — TESTOSTERONE: Testosterone: 481.71 ng/dL (ref 300.00–890.00)

## 2019-04-21 LAB — HEMOGLOBIN A1C: Hgb A1c MFr Bld: 5.5 % (ref 4.6–6.5)

## 2019-04-21 LAB — PSA: PSA: 0.3 ng/mL (ref 0.10–4.00)

## 2019-04-21 LAB — TSH: TSH: 1 u[IU]/mL (ref 0.35–4.50)

## 2019-04-21 LAB — VITAMIN D 25 HYDROXY (VIT D DEFICIENCY, FRACTURES): VITD: 19.37 ng/mL — ABNORMAL LOW (ref 30.00–100.00)

## 2019-04-21 MED ORDER — ALPRAZOLAM 0.5 MG PO TABS: 0.5000 mg | ORAL_TABLET | Freq: Every day | ORAL | 5 refills | Status: DC | PRN

## 2019-04-21 MED ORDER — BUDESONIDE 90 MCG/ACT IN AEPB: 1.0000 | INHALATION_SPRAY | Freq: Two times a day (BID) | RESPIRATORY_TRACT | 3 refills | Status: DC

## 2019-04-21 NOTE — Telephone Encounter (Signed)
Patient's Hgb is critical at 18.1.

## 2019-04-21 NOTE — Assessment & Plan Note (Signed)
Patient had an anterior labral tear with some bicep tendinitis.  Significant improvement already noted.  Some mild tightness but seems to be making progress.  Patient will continue with the post PRP progression.  Follow-up again 4 weeks to make sure patient is nearly completely healed at that time

## 2019-04-21 NOTE — Progress Notes (Signed)
Subjective:    Patient ID: Jason Hogan, male    DOB: 1975/12/17, 44 y.o.   MRN: [id number]  HPI  Here for wellness and f/u;  Overall doing ok;  Pt denies Chest pain, worsening SOB, DOE, orthopnea, PND, worsening LE edema, palpitations, dizziness or syncope, but has intermittent wheezing, does not take steroid inhaler as the long acting formoterol causes anxiety.  Pt denies neurological change such as new headache, facial or extremity weakness.  Pt denies polydipsia, polyuria, or low sugar symptoms. Pt states overall good compliance with treatment and medications, good tolerability, and has been trying to follow appropriate diet.  Pt has had mild worsening depressive symptoms, but no suicidal ideation or panic but also worsening anxiety - begun on low dose zoloft per cardiology. No fever, night sweats, wt loss, loss of appetite, or other constitutional symptoms.  Pt states good ability with ADL's, has low fall risk, home safety reviewed and adequate, no other significant changes in hearing or vision, and only occasionally active with exercise.  Denies urinary symptoms such as dysuria, frequency, urgency, flank pain, hematuria or n/v, fever, chills. Though has had an abnormal odor for several days.  Has plan for lipids last wk on repatha.  Also believes he has a food allergy, asks for testing.  Asks for testosterone level with hx of low testosterone.   Past Medical History:  Diagnosis Date  . Acute Myocardial Infarction 08/18/2009   Qualifier: Diagnosis of  By: [name]    . Allergic rhinitis, cause unspecified 10/03/2011  . Anxiety 10/03/2011  . Asthma 09/20/2006   Qualifier: Diagnosis of  By: [name], [name]   . 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 Qualifier: Diagnosis of  By: [name], MD, [name]   . CAD (coronary artery disease)    a. large anterior MI 2011 s/p PTCA/DES to 100%. b. occluded LAD with occluded  collateralized silent RCA, balloon angioplasty to stented segment of LAD and DES x1 to mid circumflex in 10/2017.  [name] Cholelithiasis 06/22/2007   Qualifier: Diagnosis of  By: [name]   . Chronic systolic heart failure ([place]) 09/18/2009   Qualifier: Diagnosis of  By: [name]    . Depressive disorder, not elsewhere classified 06/22/2007   Qualifier: Diagnosis of  By: [name]   . History of gout   . History of kidney stones   . Hyperlipidemia 81/2011  . Hypertension   . Impaired glucose tolerance 10/05/2012  . Insomnia, unspecified 06/22/2007   Qualifier: Diagnosis of  By: [name], [name]   . Ischemic cardiomyopathy 10/20/2009   Qualifier: Diagnosis of  By: [name], MD, [facility], Mack Guise   . 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 Migraine Headache 09/18/2009   Qualifier: Diagnosis of  By: [name]    . Renal calculus   . Renal Insufficiency 09/18/2009   Qualifier: Diagnosis of  By: [name]    . Respiratory failure ([place]) 08/2009  . Rhabdomyolysis 09/18/2009   Qualifier: Diagnosis of  By: [name]    . VT (ventricular tachycardia) ([place])    Past Surgical History:  Procedure Laterality Date  . CARDIAC CATHETERIZATION N/A 05/17/2015   Procedure: Left Heart Cath and Coronary Angiography;  Surgeon: [name], MD;  Location: [facility];  Service: Cardiovascular;  Laterality: N/A;  . CARDIAC CATHETERIZATION  2012  . CORONARY ANGIOPLASTY WITH STENT PLACEMENT  2011  . CORONARY STENT INTERVENTION N/A  11/14/2017   Procedure: CORONARY STENT INTERVENTION;  Surgeon: Kathleene Hazel, MD;  Location: [facility];  Service: Cardiovascular;  Laterality: N/A;  . ICD IMPLANT  11/2009   "w/pacing function"  . RIGHT/LEFT HEART CATH AND CORONARY ANGIOGRAPHY N/A 11/14/2017   Procedure: RIGHT/LEFT HEART CATH AND CORONARY ANGIOGRAPHY;  Surgeon: Kathleene Hazel, MD;  Location: [facility];  Service: Cardiovascular;   Laterality: N/A;    reports that he quit smoking about 25 years ago. His smoking use included cigarettes. He has a 0.20 pack-year smoking history. He has never used smokeless tobacco. He reports current alcohol use. He reports that he does not use drugs. family history includes Diabetes in an other family member; Heart attack in his father; Heart disease in an other family member; Hypothyroidism in an other family member. Allergies  Allergen Reactions  . Levaquin [Levofloxacin] Other (See Comments)    Joint pain  . Ace Inhibitors Cough  . Crestor [Rosuvastatin] Other (See Comments)    "Sweet cravings"  . Lunesta [Eszopiclone] Other (See Comments)    "Bitter taste in mouth"  . Zocor [Simvastatin] Other (See Comments)    Memory problem   Current Outpatient Medications on File Prior to Visit  Medication Sig Dispense Refill  . albuterol (VENTOLIN HFA) 108 (90 Base) MCG/ACT inhaler Inhale 1-2 puffs into the lungs every 6 (six) hours as needed for wheezing or shortness of breath. 18 g 11  . anastrozole (ARIMIDEX) 1 MG tablet Take 1 mg by mouth daily.    [name] aspirin EC 81 MG tablet Take 81 mg by mouth daily.    . carvedilol (COREG) 6.25 MG tablet Take 1.5 tablets (9.375 mg total) by mouth 2 (two) times daily. 90 tablet 6  . Coenzyme Q10 (CO Q 10) 100 MG CAPS Take 100 mg by mouth daily.     . Evolocumab (REPATHA SURECLICK) 140 MG/ML SOAJ Inject 1 pen into the skin every 14 (fourteen) days. 2 pen 11  . ezetimibe (ZETIA) 10 MG tablet Take 1 tablet (10 mg total) by mouth daily. 30 tablet 11  . Fluticasone-Salmeterol (WIXELA INHUB) 250-50 MCG/DOSE AEPB Inhale 1 puff into the lungs 2 (two) times daily. 60 each 11  . KLOR-CON M20 20 MEQ tablet TAKE 2 TABLETS BY MOUTH EVERY DAY 180 tablet 3  . metolazone (ZAROXOLYN) 5 MG tablet TAKE 1 TABLET (5 MG TOTAL) BY MOUTH DAILY AS NEEDED (FOR EDEMA). 30 tablet 9  . mexiletine (MEXITIL) 200 MG capsule TAKE 1 CAPSULE BY MOUTH TWICE A DAY 30 capsule 9  .  nitroGLYCERIN (NITROSTAT) 0.4 MG SL tablet Place 1 tablet (0.4 mg total) under the tongue every 5 (five) minutes as needed for chest pain. 25 tablet 3  . prasugrel (EFFIENT) 10 MG TABS tablet Take 1 tablet (10 mg total) by mouth daily. 90 tablet 3  . sacubitril-valsartan (ENTRESTO) 49-51 MG Take 1 tablet by mouth 2 (two) times daily. 60 tablet 5  . sertraline (ZOLOFT) 25 MG tablet Take 1 tablet (25 mg total) by mouth daily. 30 tablet 3  . spironolactone (ALDACTONE) 25 MG tablet Take 1 tablet (25 mg total) by mouth daily. 30 tablet 5  . tiZANidine (ZANAFLEX) 4 MG tablet TAKE 1 TABLET BY MOUTH TWICE A DAY AS NEEDED 60 tablet 2  . torsemide (DEMADEX) 20 MG tablet Take 1 tab by mouth every 3 days 30 tablet 2   No current facility-administered medications on file prior to visit.   Review of Systems All otherwise  neg per pt    Objective:   Physical Exam BP 126/78   Pulse 84   Temp 98.1 F (36.7 C)   Ht 6\' 2"  (1.88 m)   Wt 224 lb (101.6 kg)   SpO2 99%   BMI 28.76 kg/m  VS noted,  Constitutional: Pt appears in NAD HENT: Head: NCAT.  Right Ear: External ear normal.  Left Ear: External ear normal.  Eyes: . Pupils are equal, round, and reactive to light. Conjunctivae and EOM are normal Nose: without d/c or deformity Neck: Neck supple. Gross normal ROM Cardiovascular: Normal rate and regular rhythm.   Pulmonary/Chest: Effort normal and breath sounds decreased without rales but with mild intermittent wheezing.  Abd:  Soft, NT, ND, + BS, no organomegaly Neurological: Pt is alert. At baseline orientation, motor grossly intact Skin: Skin is warm. No rashes, other new lesions, no LE edema Psychiatric: Pt behavior is normal without agitation  All otherwise neg per pt Lab Results  Component Value Date   WBC 7.6 04/21/2019   HGB 18.1 Repeated and verified X2. (HH) 04/21/2019   HCT 55.0 (H) 04/21/2019   PLT 182.0 04/21/2019   GLUCOSE 80 04/16/2019   CHOL 135 01/22/2019   TRIG 86 01/22/2019     HDL 40 01/22/2019   LDLDIRECT 101.0 10/24/2015   LDLCALC 78 01/22/2019   ALT 24 04/21/2019   AST 21 04/21/2019   NA 136 04/16/2019   K 4.5 04/16/2019   CL 104 04/16/2019   CREATININE 1.10 04/16/2019   BUN 8 04/16/2019   CO2 23 04/16/2019   TSH 1.00 04/21/2019   PSA 0.30 04/21/2019   INR 1.1 (H) 08/01/2016   HGBA1C 5.5 04/21/2019      Assessment & Plan:

## 2019-04-21 NOTE — Patient Instructions (Signed)
Ok to change the advair to pulmicort as directed  Please continue all other medications as before, and refills have been done if requested.  Please have the pharmacy call with any other refills you may need.  Please continue your efforts at being more active, low cholesterol diet, and weight control.  You are otherwise up to date with prevention measures today.  Please keep your appointments with your specialists as you may have planned  Please go to the LAB at the blood drawing area for the tests to be done  You will be contacted by phone if any changes need to be made immediately.  Otherwise, you will receive a letter about your results with an explanation, but please check with MyChart first.  Please remember to sign up for MyChart if you have not done so, as this will be important to you in the future with finding out test results, communicating by private email, and scheduling acute appointments online when needed.  Please make an Appointment to return in 6 months, or sooner if needed

## 2019-04-21 NOTE — Patient Instructions (Signed)
Looks great.  See me again in 4-5 weeks

## 2019-04-21 NOTE — Progress Notes (Signed)
[name] [address] [place] [address] Phone: [phone] Subjective:   Jason Hogan, am serving as a scribe for Dr. Antoine Primas. This visit occurred during the SARS-CoV-2 public health emergency.  Safety protocols were in place, including screening questions prior to the visit, additional usage of staff PPE, and extensive cleaning of exam room while observing appropriate contact time as indicated for disinfecting solutions.   I'm seeing this patient by the request  of:  Corwin Levins, MD   CC: Right shoulder pain follow-up  ION:[id number]   03/25/2019 Patient had PRP injection today.  Tolerated procedure well.  Went for the anterior aspect.  Hopefully that this will be beneficial.  Discussed icing regimen.  Topical anti-inflammatories.  Patient will continue with the other kind of medications at the moment.  We discussed the idea of avoiding icing and anti-inflammatories.  Follow-up with me again 2 to 3 weeks. Update 04/21/2019 Jason Hogan is a 44 y.o. male coming in with complaint of right shoulder pain. Patient states that he feels he is improving. Has had a couple instances of pain since last visit. Was pushing his son's chair in and that motion bothered him. Did develop a headache last weekend. Is able to sleep to on that side now.  Patient states that this is the best he has felt in quite some time.  He still has some stiffness with range of motion.  Has not done any significant lifting yet      Past Medical History:  Diagnosis Date  . Acute Myocardial Infarction 08/18/2009   Qualifier: Diagnosis of  By: Kem Parkinson    . Allergic rhinitis, cause unspecified 10/03/2011  . Anxiety 10/03/2011  . Asthma 09/20/2006   Qualifier: Diagnosis of  By: Jonny Ruiz MD, Len Blalock   . 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 Qualifier: Diagnosis of  By: Eden Emms, MD, Harrington Challenger   . CAD  (coronary artery disease)    a. large anterior MI 2011 s/p PTCA/DES to 100%. b. occluded LAD with occluded collateralized silent RCA, balloon angioplasty to stented segment of LAD and DES x1 to mid circumflex in 10/2017.  [name] Cholelithiasis 06/22/2007   Qualifier: Diagnosis of  By: Maris Berger   . Chronic systolic heart failure (HCC) 09/18/2009   Qualifier: Diagnosis of  By: Kem Parkinson    . Depressive disorder, not elsewhere classified 06/22/2007   Qualifier: Diagnosis of  By: Maris Berger   . History of gout   . History of kidney stones   . Hyperlipidemia 81/2011  . Hypertension   . Impaired glucose tolerance 10/05/2012  . Insomnia, unspecified 06/22/2007   Qualifier: Diagnosis of  By: Jonny Ruiz MD, Len Blalock   . Ischemic cardiomyopathy 10/20/2009   Qualifier: Diagnosis of  By: Graciela Husbands, MD, [facility], Ty Hilts   . 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 Migraine Headache 09/18/2009   Qualifier: Diagnosis of  By: Kem Parkinson    . Renal calculus   . Renal Insufficiency 09/18/2009   Qualifier: Diagnosis of  By: Kem Parkinson    . Respiratory failure (HCC) 08/2009  . Rhabdomyolysis 09/18/2009   Qualifier: Diagnosis of  By: Kem Parkinson    . VT (ventricular tachycardia) (HCC)    Past Surgical History:  Procedure Laterality Date  . CARDIAC CATHETERIZATION N/A 05/17/2015   Procedure: Left Heart Cath and Coronary Angiography;  Surgeon: Wendall Stade, MD;  Location: [facility] INVASIVE CV  LAB;  Service: Cardiovascular;  Laterality: N/A;  . CARDIAC CATHETERIZATION  2012  . CORONARY ANGIOPLASTY WITH STENT PLACEMENT  2011  . CORONARY STENT INTERVENTION N/A 11/14/2017   Procedure: CORONARY STENT INTERVENTION;  Surgeon: Kathleene Hazel, MD;  Location: [facility] INVASIVE CV LAB;  Service: Cardiovascular;  Laterality: N/A;  . ICD IMPLANT  11/2009   "w/pacing function"  . RIGHT/LEFT HEART CATH AND CORONARY ANGIOGRAPHY N/A 11/14/2017   Procedure: RIGHT/LEFT HEART CATH AND CORONARY  ANGIOGRAPHY;  Surgeon: Kathleene Hazel, MD;  Location: [facility] INVASIVE CV LAB;  Service: Cardiovascular;  Laterality: N/A;   Social History   Socioeconomic History  . Marital status: Married    Spouse name: Not on file  . Number of children: 0  . Years of education: Not on file  . Highest education level: Not on file  Occupational History  . 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 Smoking status: Former Smoker    Packs/day: 0.10    Years: 2.00    Pack years: 0.20    Types: Cigarettes    Quit date: 02/18/1994    Years since quitting: 25.1  . Smokeless tobacco: Never Used  Substance and Sexual Activity  . Alcohol use: Yes    Comment: 11/25/2017 "couple beers/month; if that"  . Drug use: Never  . Sexual activity: Yes  Other Topics Concern  . Not on file  Social History Narrative  . Not on file   Social Determinants of Health   Financial Resource Strain:   . Difficulty of Paying Living Expenses: Not on file  Food Insecurity:   . Worried About Programme researcher, broadcasting/film/video in the Last Year: Not on file  . Ran Out of Food in the Last Year: Not on file  Transportation Needs:   . Lack of Transportation (Medical): Not on file  . Lack of Transportation (Non-Medical): Not on file  Physical Activity:   . Days of Exercise per Week: Not on file  . Minutes of Exercise per Session: Not on file  Stress:   . Feeling of Stress : Not on file  Social Connections:   . Frequency of Communication with Friends and Family: Not on file  . Frequency of Social Gatherings with Friends and Family: Not on file  . Attends Religious Services: Not on file  . Active Member of Clubs or Organizations: Not on file  . Attends Banker Meetings: Not on file  . Marital Status: Not on file   Allergies  Allergen Reactions  . Levaquin [Levofloxacin] Other (See Comments)    Joint pain  . Ace Inhibitors Cough  . Crestor [Rosuvastatin] Other (See Comments)    "Sweet cravings"    . Lunesta [Eszopiclone] Other (See Comments)    "Bitter taste in mouth"  . Zocor [Simvastatin] Other (See Comments)    Memory problem   Family History  Problem Relation Age of Onset  . Heart attack Father   . Heart disease Other   . Diabetes Other   . Hypothyroidism Other      Current Outpatient Medications (Cardiovascular):  .  carvedilol (COREG) 6.25 MG tablet, Take 1.5 tablets (9.375 mg total) by mouth 2 (two) times daily. .  Evolocumab (REPATHA SURECLICK) 140 MG/ML SOAJ, Inject 1 pen into the skin every 14 (fourteen) days. [name]  ezetimibe (ZETIA) 10 MG tablet, Take 1 tablet (10 mg total) by mouth daily. .  metolazone (ZAROXOLYN) 5 MG tablet, TAKE 1 TABLET (5 MG TOTAL)  BY MOUTH DAILY AS NEEDED (FOR EDEMA). .  mexiletine (MEXITIL) 200 MG capsule, TAKE 1 CAPSULE BY MOUTH TWICE A DAY .  nitroGLYCERIN (NITROSTAT) 0.4 MG SL tablet, Place 1 tablet (0.4 mg total) under the tongue every 5 (five) minutes as needed for chest pain. .  sacubitril-valsartan (ENTRESTO) 49-51 MG, Take 1 tablet by mouth 2 (two) times daily. [name]  spironolactone (ALDACTONE) 25 MG tablet, Take 1 tablet (25 mg total) by mouth daily. [name]  torsemide (DEMADEX) 20 MG tablet, Take 1 tab by mouth every 3 days  Current Outpatient Medications (Respiratory):  .  albuterol (VENTOLIN HFA) 108 (90 Base) MCG/ACT inhaler, Inhale 1-2 puffs into the lungs every 6 (six) hours as needed for wheezing or shortness of breath. .  Fluticasone-Salmeterol (WIXELA INHUB) 250-50 MCG/DOSE AEPB, Inhale 1 puff into the lungs 2 (two) times daily.  Current Outpatient Medications (Analgesics):  .  aspirin EC 81 MG tablet, Take 81 mg by mouth daily.  Current Outpatient Medications (Hematological):  .  prasugrel (EFFIENT) 10 MG TABS tablet, Take 1 tablet (10 mg total) by mouth daily.  Current Outpatient Medications (Other):  [name]  ALPRAZolam (XANAX) 0.5 MG tablet, TAKE 1 TABLET (0.5 MG TOTAL) BY MOUTH DAILY AS NEEDED FOR ANXIETY. [name]  anastrozole (ARIMIDEX)  1 MG tablet, Take 1 mg by mouth daily. .  Coenzyme Q10 (CO Q 10) 100 MG CAPS, Take 100 mg by mouth daily.  [name]  KLOR-CON M20 20 MEQ tablet, TAKE 2 TABLETS BY MOUTH EVERY DAY .  sertraline (ZOLOFT) 25 MG tablet, Take 1 tablet (25 mg total) by mouth daily. [name]  tiZANidine (ZANAFLEX) 4 MG tablet, TAKE 1 TABLET BY MOUTH TWICE A DAY AS NEEDED .  Vitamin D, Ergocalciferol, (DRISDOL) 1.25 MG (50000 UT) CAPS capsule, TAKE 1 CAPSULE (50,000 UNITS TOTAL) BY MOUTH EVERY 7 (SEVEN) DAYS.   Reviewed prior external information including notes and imaging from  primary care provider As well as notes that were available from care everywhere and other healthcare systems.  Past medical history, social, surgical and family history all reviewed in electronic medical record.  No pertanent information unless stated regarding to the chief complaint.   Review of Systems:  No headache, visual changes, nausea, vomiting, diarrhea, constipation, dizziness, abdominal pain, skin rash, fevers, chills, night sweats, weight loss, swollen lymph nodes, body aches, joint swelling, chest pain, shortness of breath, mood changes. POSITIVE muscle aches  Objective  Blood pressure 118/86, pulse 92, height 6\' 2"  (1.88 m), weight 225 lb (102.1 kg), SpO2 99 %.   General: No apparent distress alert and oriented x3 mood and affect normal, dressed appropriately.  HEENT: Pupils equal, extraocular movements intact  Respiratory: Patient's speak in full sentences and does not appear short of breath  Cardiovascular: No lower extremity edema, non tender, no erythema  Skin: Warm dry intact with no signs of infection or rash on extremities or on axial skeleton.  Abdomen: Soft nontender  Neuro: Cranial nerves II through XII are intact, neurovascularly intact in all extremities with 2+ DTRs and 2+ pulses.  Lymph: No lymphadenopathy of posterior or anterior cervical chain or axillae bilaterally.  Gait normal with good balance and coordination.  MSK:  Right shoulder exam shows the patient does have some tightness noted of the capsule posteriorly.  Mild discomfort with speeds.   Limited musculoskeletal ultrasound was performed and interpreted by Lyndal Pulley  Limited ultrasound of patient's bicep tendon shows significant decrease in diameter at the moment.  Significant decrease in neovascularization  and hypoechoic changes as well.  Impression: Interval healing noted    Impression and Recommendations:     The above documentation has been reviewed and is accurate and complete Judi Saa, DO       Note: This dictation was prepared with Dragon dictation along with smaller phrase technology. Any transcriptional errors that result from this process are unintentional.

## 2019-04-22 ENCOUNTER — Other Ambulatory Visit: Payer: Self-pay | Admitting: Internal Medicine

## 2019-04-22 ENCOUNTER — Encounter: Payer: Self-pay | Admitting: Internal Medicine

## 2019-04-22 LAB — FOOD ALLERGY PROFILE
Allergen, Salmon, f41: <0.1 kU/L
Almonds: <0.1 kU/L
CLASS: 0
CLASS: 0
CLASS: 0
CLASS: 0
CLASS: 0
CLASS: 0
CLASS: 0
CLASS: 0
CLASS: 0
CLASS: 0
Cashew IgE: <0.1 kU/L
Class: 0
Class: 0
Class: 0
Class: 0
Egg White IgE: <0.1 kU/L
Fish Cod: <0.1 kU/L
Hazelnut: <0.1 kU/L
Milk IgE: <0.1 kU/L
Peanut IgE: <0.1 kU/L
Scallop IgE: <0.1 kU/L
Sesame Seed f10: 0.11 kU/L — ABNORMAL HIGH
Shrimp IgE: <0.1 kU/L
Soybean IgE: <0.1 kU/L
Tuna IgE: <0.1 kU/L
Walnut: <0.1 kU/L
Wheat IgE: <0.1 kU/L

## 2019-04-22 LAB — URINALYSIS, ROUTINE W REFLEX MICROSCOPIC
Bilirubin Urine: NEGATIVE
Ketones, ur: NEGATIVE
Leukocytes,Ua: NEGATIVE
Nitrite: NEGATIVE
Specific Gravity, Urine: 1.02 (ref 1.000–1.030)
Total Protein, Urine: NEGATIVE
Urine Glucose: NEGATIVE
Urobilinogen, UA: 0.2 (ref 0.0–1.0)
WBC, UA: NONE SEEN (ref 0–?)
pH: 6.5 (ref 5.0–8.0)

## 2019-04-22 LAB — INTERPRETATION:

## 2019-04-22 MED ORDER — VITAMIN D (ERGOCALCIFEROL) 1.25 MG (50000 UNIT) PO CAPS: 50000.0000 [IU] | ORAL_CAPSULE | ORAL | 0 refills | Status: DC

## 2019-04-23 ENCOUNTER — Encounter (HOSPITAL_COMMUNITY): Payer: Self-pay

## 2019-04-25 ENCOUNTER — Encounter: Payer: Self-pay | Admitting: Internal Medicine

## 2019-04-25 NOTE — Assessment & Plan Note (Signed)
stable overall by history and exam, recent data reviewed with pt, and pt to continue medical treatment as before,  to f/u any worsening symptoms or concerns  

## 2019-04-25 NOTE — Assessment & Plan Note (Signed)
Mild persistent, for change advaiir to pulmicort

## 2019-04-25 NOTE — Assessment & Plan Note (Signed)
For UA -= ro uti 

## 2019-04-25 NOTE — Assessment & Plan Note (Signed)
Started low dose zoloft, consider increased dose in 3 wks if not improved

## 2019-04-25 NOTE — Assessment & Plan Note (Signed)

## 2019-04-25 NOTE — Assessment & Plan Note (Signed)
Ok for testosterone level,  to f/u any worsening symptoms or concerns

## 2019-04-25 NOTE — Assessment & Plan Note (Signed)
For benadryl prn, also lab r/o food allergy

## 2019-04-26 ENCOUNTER — Other Ambulatory Visit: Payer: 59 | Admitting: *Deleted

## 2019-04-26 ENCOUNTER — Ambulatory Visit (HOSPITAL_COMMUNITY)
Admission: RE | Admit: 2019-04-26 | Discharge: 2019-04-26 | Disposition: A | Discharge status: Home / Self Care | Payer: 59 | Source: Ambulatory Visit | Attending: Cardiology | Admitting: Cardiology

## 2019-04-26 ENCOUNTER — Other Ambulatory Visit: Payer: Self-pay

## 2019-04-26 DIAGNOSIS — E785 Hyperlipidemia, unspecified: Secondary | ICD-10-CM

## 2019-04-26 DIAGNOSIS — I5022 Chronic systolic (congestive) heart failure: Secondary | ICD-10-CM | POA: Diagnosis not present

## 2019-04-26 LAB — BASIC METABOLIC PANEL WITH GFR
Anion gap: 7 (ref 5–15)
BUN: 9 mg/dL (ref 6–20)
CO2: 24 mmol/L (ref 22–32)
Calcium: 9 mg/dL (ref 8.9–10.3)
Chloride: 107 mmol/L (ref 98–111)
Creatinine, Ser: 1.05 mg/dL (ref 0.61–1.24)
GFR calc Af Amer: >60 mL/min
GFR calc non Af Amer: >60 mL/min
Glucose, Bld: 99 mg/dL (ref 70–99)
Potassium: 4.2 mmol/L (ref 3.5–5.1)
Sodium: 138 mmol/L (ref 135–145)

## 2019-04-26 LAB — LIPID PANEL
Chol/HDL Ratio: 2.7 ratio (ref 0.0–5.0)
Cholesterol, Total: 85 mg/dL — ABNORMAL LOW (ref 100–199)
HDL: 31 mg/dL — ABNORMAL LOW
LDL Chol Calc (NIH): 38 mg/dL (ref 0–99)
Triglycerides: 75 mg/dL (ref 0–149)
VLDL Cholesterol Cal: 16 mg/dL (ref 5–40)

## 2019-04-26 LAB — HEPATIC FUNCTION PANEL
ALT: 19 IU/L (ref 0–44)
AST: 18 IU/L (ref 0–40)
Albumin: 4.2 g/dL (ref 4.0–5.0)
Alkaline Phosphatase: 52 IU/L (ref 39–117)
Bilirubin Total: 0.6 mg/dL (ref 0.0–1.2)
Bilirubin, Direct: 0.19 mg/dL (ref 0.00–0.40)
Total Protein: 6.7 g/dL (ref 6.0–8.5)

## 2019-05-03 ENCOUNTER — Other Ambulatory Visit (HOSPITAL_COMMUNITY): Payer: 59

## 2019-05-04 ENCOUNTER — Encounter (HOSPITAL_COMMUNITY): Payer: 59

## 2019-05-04 ENCOUNTER — Telehealth (HOSPITAL_COMMUNITY): Payer: Self-pay | Admitting: Cardiology

## 2019-05-04 NOTE — Telephone Encounter (Signed)
Good Afternoon,  Patient has been denied Exercise Myoview by his insurance and they will only approve GXT.. If you would like for patient to have this kind of test please enter a new order and I will be glad to schedule.  Order for Myoview will be removed from the WQ.

## 2019-05-07 ENCOUNTER — Telehealth (HOSPITAL_COMMUNITY): Payer: Self-pay

## 2019-05-07 ENCOUNTER — Inpatient Hospital Stay (HOSPITAL_COMMUNITY): Admission: RE | Admit: 2019-05-07 | Payer: 59 | Source: Ambulatory Visit

## 2019-05-07 ENCOUNTER — Encounter (HOSPITAL_COMMUNITY): Payer: 59

## 2019-05-07 NOTE — Telephone Encounter (Signed)
-----   Message from Florina Ou sent at 05/07/2019  2:51 PM EDT ----- Good afternoon,  The Celine Ahr is being appealed. [employer] [facility] was requesting the patient have a dobutamine stress echo or a GXT. I have submitted the appeal for the nuc. I will let you know as soon as I hear back from [facility].  Thanks,  Marchelle Folks  ----- Message ----- From: Minette Brine Sent: 05/07/2019   2:27 PM EDT To: Florina Ou, Marisa Hua, RN  I will be glad to forward this to the Precert team, I do not handle the PA# for the patients.  This is done by [facility] Department.  ----- Message ----- From: Marisa Hua, RN Sent: 05/07/2019   2:05 PM EDT To: Minette Brine  We are not asking for an echo to be done. We want for him to have a stress test. Can you check if this is covered?  [facility]  ----- Message ----- From: Minette Brine Sent: 05/07/2019   1:57 PM EDT To: Marisa Hua, RN   ----- Message ----- From: Francine Graven Sent: 05/07/2019  11:27 AM EDT To: Florina Ou, Minette Brine  Good morning,  A 2D echo is a covered benefit with [employer].  The 3D [phone]) is what's getting denied.  If a 2D echo only will be performed, he will be able to be seen.  ----- Message ----- From: Minette Brine Sent: 05/07/2019   8:57 AM EDT To: Florina Ou, Claudean Severance Abdul-Razzaaq  See below note.[name] Happy Friday! ----- Message ----- From: Marisa Hua, RN Sent: 05/07/2019   8:45 AM EDT To: Modesta Messing, CMA, Lowella Dell,   Dr Shirlee Latch thinks that the test should be covered based on his history.  He has known Coronary Artery Disease. He would like for it to be appealed. Is this possible?   Thank you, Rhea Pink, can you help with this if need?

## 2019-05-07 NOTE — Telephone Encounter (Signed)
Pt ordered for stress test however response from precert team states patient insurance does not cover. It was sent for appeal.  Response pending.

## 2019-05-11 ENCOUNTER — Other Ambulatory Visit (HOSPITAL_COMMUNITY): Payer: 59

## 2019-05-11 ENCOUNTER — Encounter (HOSPITAL_COMMUNITY): Payer: 59

## 2019-05-11 ENCOUNTER — Ambulatory Visit (INDEPENDENT_AMBULATORY_CARE_PROVIDER_SITE_OTHER): Payer: 59 | Admitting: *Deleted

## 2019-05-11 DIAGNOSIS — I255 Ischemic cardiomyopathy: Secondary | ICD-10-CM | POA: Diagnosis not present

## 2019-05-11 LAB — CUP PACEART REMOTE DEVICE CHECK
Battery Remaining Longevity: 23 mo
Battery Remaining Percentage: 22 %
Battery Voltage: 2.78 V
Brady Statistic RV Percent Paced: 1 %
Date Time Interrogation Session: 20210323093009
HighPow Impedance: 48 Ohm
Implantable Lead Implant Date: 20111010
Implantable Lead Location: 753860
Implantable Lead Model: 7121
Implantable Pulse Generator Implant Date: 20111010
Lead Channel Impedance Value: 1050 Ohm
Lead Channel Pacing Threshold Amplitude: 2 V
Lead Channel Pacing Threshold Pulse Width: 1 ms
Lead Channel Sensing Intrinsic Amplitude: 11.9 mV
Lead Channel Setting Pacing Amplitude: 4 V
Lead Channel Setting Pacing Pulse Width: 1 ms
Lead Channel Setting Sensing Sensitivity: 0.5 mV
Pulse Gen Serial Number: 615318

## 2019-05-12 NOTE — Progress Notes (Signed)
ICD Remote  

## 2019-05-26 ENCOUNTER — Encounter: Payer: Self-pay | Admitting: Family Medicine

## 2019-05-26 ENCOUNTER — Other Ambulatory Visit: Payer: Self-pay

## 2019-05-26 ENCOUNTER — Ambulatory Visit (INDEPENDENT_AMBULATORY_CARE_PROVIDER_SITE_OTHER): Payer: 59

## 2019-05-26 ENCOUNTER — Ambulatory Visit (INDEPENDENT_AMBULATORY_CARE_PROVIDER_SITE_OTHER): Payer: 59 | Admitting: Family Medicine

## 2019-05-26 VITALS — BP 114/72 | HR 83 | Ht 74.0 in | Wt 228.0 lb

## 2019-05-26 DIAGNOSIS — S43431A Superior glenoid labrum lesion of right shoulder, initial encounter: Secondary | ICD-10-CM | POA: Diagnosis not present

## 2019-05-26 DIAGNOSIS — G8929 Other chronic pain: Secondary | ICD-10-CM | POA: Diagnosis not present

## 2019-05-26 DIAGNOSIS — M25512 Pain in left shoulder: Secondary | ICD-10-CM

## 2019-05-26 NOTE — Assessment & Plan Note (Signed)
Significant improvement at this time.  6 weeks out from PRP.  Increase activity slowly.  We discussed taking a step back secondary to some of the discomfort he was having.  Discussed avoiding certain impingement type movements.  Follow-up with me again in 2 months

## 2019-05-26 NOTE — Patient Instructions (Signed)
See me in 8 weeks if not perfect

## 2019-05-26 NOTE — Progress Notes (Signed)
[place] Phone: [phone] Subjective:   [name], am serving as a scribe for Dr. Hulan Saas. This visit occurred during the SARS-CoV-2 public health emergency.  Safety protocols were in place, including screening questions prior to the visit, additional usage of staff PPE, and extensive cleaning of exam room while observing appropriate contact time as indicated for disinfecting solutions.   I'm seeing this patient by the request  of:  [name], MD  CC: Right shoulder pain follow-up  [id number]   04/21/2019 Patient had an anterior labral tear with some bicep tendinitis.  Significant improvement already noted.  Some mild tightness but seems to be making progress.  Patient will continue with the post PRP progression.  Follow-up again 4 weeks to make sure patient is nearly completely healed at that time  Update 05/26/2019 Jason Hogan is a 44 y.o. male coming in with complaint of right shoulder pain. Patient states that his middle deltoid has been bothering him. Pain with ER. Also notes pain in left shoulder over anterior aspect. Feels he overused left shoulder after injuring right shoulder. Since PRP injection has not had a migraine.     Past Medical History:  Diagnosis Date  . Acute Myocardial Infarction 08/18/2009   Qualifier: Diagnosis of  By: [name]    . Allergic rhinitis, cause unspecified 10/03/2011  . Anxiety 10/03/2011  . Asthma 09/20/2006   Qualifier: Diagnosis of  By: [name], [name]   . 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 Qualifier: Diagnosis of  By: [name], MD, [name]   . CAD (coronary artery disease)    a. large anterior MI 2011 s/p PTCA/DES to 100%. b. occluded LAD with occluded collateralized silent RCA, balloon angioplasty to stented segment of LAD and DES x1 to mid circumflex in 10/2017.  [name]  Cholelithiasis 06/22/2007   Qualifier: Diagnosis of  By: [name]   . Chronic systolic heart failure ([place]) 09/18/2009   Qualifier: Diagnosis of  By: [name]    . Depressive disorder, not elsewhere classified 06/22/2007   Qualifier: Diagnosis of  By: [name]   . History of gout   . History of kidney stones   . Hyperlipidemia 81/2011  . Hypertension   . Impaired glucose tolerance 10/05/2012  . Insomnia, unspecified 06/22/2007   Qualifier: Diagnosis of  By: [name], [name]   . Ischemic cardiomyopathy 10/20/2009   Qualifier: Diagnosis of  By: [name], MD, [facility], Mack Guise   . 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 Migraine Headache 09/18/2009   Qualifier: Diagnosis of  By: [name]    . Renal calculus   . Renal Insufficiency 09/18/2009   Qualifier: Diagnosis of  By: [name]    . Respiratory failure ([place]) 08/2009  . Rhabdomyolysis 09/18/2009   Qualifier: Diagnosis of  By: [name]    . VT (ventricular tachycardia) ([place])    Past Surgical History:  Procedure Laterality Date  . CARDIAC CATHETERIZATION N/A 05/17/2015   Procedure: Left Heart Cath and Coronary Angiography;  Surgeon: [name], MD;  Location: [facility] CV LAB;  Service: Cardiovascular;  Laterality: N/A;  . CARDIAC CATHETERIZATION  2012  . CORONARY ANGIOPLASTY WITH STENT PLACEMENT  2011  . CORONARY STENT INTERVENTION N/A 11/14/2017   Procedure: CORONARY STENT INTERVENTION;  Surgeon: Burnell Blanks, MD;  Location: [place] CV LAB;  Service: Cardiovascular;  Laterality: N/A;  . ICD IMPLANT  11/2009   "w/pacing function"  . RIGHT/LEFT HEART CATH AND CORONARY ANGIOGRAPHY N/A 11/14/2017   Procedure: RIGHT/LEFT HEART CATH AND CORONARY ANGIOGRAPHY;  Surgeon: Kathleene Hazel, MD;  Location: [facility] INVASIVE CV LAB;  Service: Cardiovascular;  Laterality: N/A;   Social History   Socioeconomic History  . Marital status: Married    Spouse name: Not on file   . Number of children: 0  . Years of education: Not on file  . Highest education level: Not on file  Occupational History  . 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 Smoking status: Former Smoker    Packs/day: 0.10    Years: 2.00    Pack years: 0.20    Types: Cigarettes    Quit date: 02/18/1994    Years since quitting: 25.2  . Smokeless tobacco: Never Used  Substance and Sexual Activity  . Alcohol use: Yes    Comment: 11/25/2017 "couple beers/month; if that"  . Drug use: Never  . Sexual activity: Yes  Other Topics Concern  . Not on file  Social History Narrative  . Not on file   Social Determinants of Health   Financial Resource Strain:   . Difficulty of Paying Living Expenses:   Food Insecurity:   . Worried About Programme researcher, broadcasting/film/video in the Last Year:   . Barista in the Last Year:   Transportation Needs:   . Freight forwarder (Medical):   [name] Lack of Transportation (Non-Medical):   Physical Activity:   . Days of Exercise per Week:   . Minutes of Exercise per Session:   Stress:   . Feeling of Stress :   Social Connections:   . Frequency of Communication with Friends and Family:   . Frequency of Social Gatherings with Friends and Family:   . Attends Religious Services:   . Active Member of Clubs or Organizations:   . Attends Banker Meetings:   [name] Marital Status:    Allergies  Allergen Reactions  . Levaquin [Levofloxacin] Other (See Comments)    Joint pain  . Ace Inhibitors Cough  . Crestor [Rosuvastatin] Other (See Comments)    "Sweet cravings"  . Lunesta [Eszopiclone] Other (See Comments)    "Bitter taste in mouth"  . Zocor [Simvastatin] Other (See Comments)    Memory problem   Family History  Problem Relation Age of Onset  . Heart attack Father   . Heart disease Other   . Diabetes Other   . Hypothyroidism Other      Current Outpatient Medications (Cardiovascular):  .  carvedilol (COREG) 6.25 MG tablet,  Take 1.5 tablets (9.375 mg total) by mouth 2 (two) times daily. .  Evolocumab (REPATHA SURECLICK) 140 MG/ML SOAJ, Inject 1 pen into the skin every 14 (fourteen) days. [name]  ezetimibe (ZETIA) 10 MG tablet, Take 1 tablet (10 mg total) by mouth daily. .  metolazone (ZAROXOLYN) 5 MG tablet, TAKE 1 TABLET (5 MG TOTAL) BY MOUTH DAILY AS NEEDED (FOR EDEMA). .  mexiletine (MEXITIL) 200 MG capsule, TAKE 1 CAPSULE BY MOUTH TWICE A DAY .  nitroGLYCERIN (NITROSTAT) 0.4 MG SL tablet, Place 1 tablet (0.4 mg total) under the tongue every 5 (five) minutes as needed for chest pain. .  sacubitril-valsartan (ENTRESTO) 49-51 MG, Take 1 tablet by mouth 2 (two) times daily. [name]  spironolactone (ALDACTONE) 25 MG tablet, Take 1 tablet (25 mg total) by mouth daily. [name]  torsemide (DEMADEX) 20 MG tablet, Take 1 tab by mouth every 3 days  Current Outpatient Medications (Respiratory):  .  albuterol (VENTOLIN HFA) 108 (90 Base) MCG/ACT inhaler, Inhale 1-2 puffs into the lungs every 6 (six) hours as needed for wheezing or shortness of breath. .  Budesonide 90 MCG/ACT inhaler, Inhale 1 puff into the lungs 2 (two) times daily. .  Fluticasone-Salmeterol (WIXELA INHUB) 250-50 MCG/DOSE AEPB, Inhale 1 puff into the lungs 2 (two) times daily.  Current Outpatient Medications (Analgesics):  .  aspirin EC 81 MG tablet, Take 81 mg by mouth daily.  Current Outpatient Medications (Hematological):  .  prasugrel (EFFIENT) 10 MG TABS tablet, Take 1 tablet (10 mg total) by mouth daily.  Current Outpatient Medications (Other):  [name]  ALPRAZolam (XANAX) 0.5 MG tablet, Take 1 tablet (0.5 mg total) by mouth daily as needed for anxiety. [name]  anastrozole (ARIMIDEX) 1 MG tablet, Take 1 mg by mouth daily. .  Coenzyme Q10 (CO Q 10) 100 MG CAPS, Take 100 mg by mouth daily.  [name]  KLOR-CON M20 20 MEQ tablet, TAKE 2 TABLETS BY MOUTH EVERY DAY .  sertraline (ZOLOFT) 25 MG tablet, Take 1 tablet (25 mg total) by mouth daily. [name]  tiZANidine (ZANAFLEX) 4 MG tablet,  TAKE 1 TABLET BY MOUTH TWICE A DAY AS NEEDED .  Vitamin D, Ergocalciferol, (DRISDOL) 1.25 MG (50000 UNIT) CAPS capsule, Take 1 capsule (50,000 Units total) by mouth every 7 (seven) days.   Reviewed prior external information including notes and imaging from  primary care provider As well as notes that were available from care everywhere and other healthcare systems.  Past medical history, social, surgical and family history all reviewed in electronic medical record.  No pertanent information unless stated regarding to the chief complaint.   Review of Systems:  No headache, visual changes, nausea, vomiting, diarrhea, constipation, dizziness, abdominal pain, skin rash, fevers, chills, night sweats, weight loss, swollen lymph nodes, body aches, joint swelling, chest pain, shortness of breath, mood changes. POSITIVE muscle aches  Objective  Blood pressure 114/72, pulse 83, height 6\' 2"  (1.88 m), weight 228 lb (103.4 kg), SpO2 97 %.   General: No apparent distress alert and oriented x3 mood and affect normal, dressed appropriately.  HEENT: Pupils equal, extraocular movements intact  Respiratory: Patient's speak in full sentences and does not appear short of breath  Cardiovascular: No lower extremity edema, non tender, no erythema  Neuro: Cranial nerves II through XII are intact, neurovascularly intact in all extremities with 2+ DTRs and 2+ pulses.  Gait normal with good balance and coordination.  MSK:  Non tender with full range of motion and good stability and symmetric strength and tone of  elbows, wrist, hip, knee and ankles bilaterally.  Right shoulder exam shows the patient still has some mild positive impingement with Hawkins.  5-5 strength of the rotator cuff.  Does have some pain with rotator cuff strength testing.  Full range of motion noted.  Neurovascularly intact distally.   Impression and Recommendations:    . The above documentation has been reviewed and is accurate and complete  , DO       Note: This dictation was prepared with Dragon dictation along with smaller phrase technology. Any transcriptional errors that result from this process are unintentional.

## 2019-05-28 ENCOUNTER — Other Ambulatory Visit: Payer: Self-pay

## 2019-05-28 ENCOUNTER — Ambulatory Visit (HOSPITAL_COMMUNITY)
Admission: RE | Admit: 2019-05-28 | Discharge: 2019-05-28 | Disposition: A | Discharge status: Home / Self Care | Payer: 59 | Source: Ambulatory Visit | Attending: Cardiology | Admitting: Cardiology

## 2019-05-28 ENCOUNTER — Encounter (HOSPITAL_COMMUNITY): Payer: Self-pay | Admitting: Cardiology

## 2019-05-28 VITALS — BP 124/76 | HR 77 | Wt 220.4 lb

## 2019-05-28 DIAGNOSIS — I252 Old myocardial infarction: Secondary | ICD-10-CM | POA: Insufficient documentation

## 2019-05-28 DIAGNOSIS — I110 Hypertensive heart disease with heart failure: Secondary | ICD-10-CM | POA: Diagnosis not present

## 2019-05-28 DIAGNOSIS — I255 Ischemic cardiomyopathy: Secondary | ICD-10-CM | POA: Diagnosis not present

## 2019-05-28 DIAGNOSIS — F419 Anxiety disorder, unspecified: Secondary | ICD-10-CM | POA: Diagnosis not present

## 2019-05-28 DIAGNOSIS — Z7902 Long term (current) use of antithrombotics/antiplatelets: Secondary | ICD-10-CM | POA: Diagnosis not present

## 2019-05-28 DIAGNOSIS — D751 Secondary polycythemia: Secondary | ICD-10-CM | POA: Insufficient documentation

## 2019-05-28 DIAGNOSIS — Z79899 Other long term (current) drug therapy: Secondary | ICD-10-CM | POA: Insufficient documentation

## 2019-05-28 DIAGNOSIS — I2510 Atherosclerotic heart disease of native coronary artery without angina pectoris: Secondary | ICD-10-CM | POA: Insufficient documentation

## 2019-05-28 DIAGNOSIS — I5022 Chronic systolic (congestive) heart failure: Secondary | ICD-10-CM | POA: Diagnosis not present

## 2019-05-28 DIAGNOSIS — E785 Hyperlipidemia, unspecified: Secondary | ICD-10-CM | POA: Insufficient documentation

## 2019-05-28 LAB — BASIC METABOLIC PANEL
Anion gap: 12 (ref 5–15)
BUN: 9 mg/dL (ref 6–20)
CO2: 22 mmol/L (ref 22–32)
Calcium: 9.6 mg/dL (ref 8.9–10.3)
Chloride: 103 mmol/L (ref 98–111)
Creatinine, Ser: 0.98 mg/dL (ref 0.61–1.24)
GFR calc Af Amer: >60 mL/min (ref >60)
GFR calc non Af Amer: >60 mL/min (ref >60)
Glucose, Bld: 91 mg/dL (ref 70–99)
Potassium: 4.1 mmol/L (ref 3.5–5.1)
Sodium: 137 mmol/L (ref 135–145)

## 2019-05-28 LAB — CBC
HCT: 55 % — ABNORMAL HIGH (ref 39.0–52.0)
Hemoglobin: 17.1 g/dL — ABNORMAL HIGH (ref 13.0–17.0)
MCH: 27.1 pg (ref 26.0–34.0)
MCHC: 31.1 g/dL (ref 30.0–36.0)
MCV: 87.2 fL (ref 80.0–100.0)
Platelets: 207 10*3/uL (ref 150–400)
RBC: 6.31 MIL/uL — ABNORMAL HIGH (ref 4.22–5.81)
RDW: 15.2 % (ref 11.5–15.5)
WBC: 5.1 10*3/uL (ref 4.0–10.5)
nRBC: 0 % (ref 0.0–0.2)

## 2019-05-28 MED ORDER — CARVEDILOL 12.5 MG PO TABS: 12.5000 mg | ORAL_TABLET | Freq: Two times a day (BID) | ORAL | 5 refills | Status: DC

## 2019-05-28 MED ORDER — SPIRONOLACTONE 25 MG PO TABS: 25.0000 mg | ORAL_TABLET | Freq: Every day | ORAL | 5 refills | Status: DC

## 2019-05-28 MED ORDER — SACUBITRIL-VALSARTAN 97-103 MG PO TABS: 1.0000 | ORAL_TABLET | Freq: Two times a day (BID) | ORAL | 3 refills | Status: DC

## 2019-05-28 MED ORDER — SACUBITRIL-VALSARTAN 97-103 MG PO TABS: 1.0000 | ORAL_TABLET | Freq: Two times a day (BID) | ORAL | 5 refills | Status: DC

## 2019-05-28 MED ORDER — SPIRONOLACTONE 25 MG PO TABS: 25.0000 mg | ORAL_TABLET | Freq: Every day | ORAL | 5 refills | Status: DC | PRN

## 2019-05-28 MED ORDER — TORSEMIDE 20 MG PO TABS: 20.0000 mg | ORAL_TABLET | Freq: Every day | ORAL | 2 refills | Status: DC | PRN

## 2019-05-28 NOTE — Patient Instructions (Addendum)
INCREASE Coreg to 12.5mg  (1 tab) twice a day  INCREASE Entresto 97/103mg  (1 tab) twice a day  You can take your Torsemide as needed  Labs today and repeat in 2 weeks We will only contact you if something comes back abnormal or we need to make some changes. Otherwise no news is good news!  Stress test is cancelled  Your physician has requested that you have an echocardiogram. Echocardiography is a painless test that uses sound waves to create images of your heart. It provides your doctor with information about the size and shape of your heart and how well your heart's chambers and valves are working. This procedure takes approximately one hour. There are no restrictions for this procedure.  Your physician recommends that you schedule a follow-up appointment in: 3 months with Dr Shirlee Latch  Please call office at [phone] option 2 if you have any questions or concerns.   At the [facility], you and your health needs are our priority. As part of our continuing mission to provide you with exceptional heart care, we have created designated Provider Care Teams. These Care Teams include your primary Cardiologist (physician) and Advanced Practice Providers (APPs- Physician Assistants and Nurse Practitioners) who all work together to provide you with the care you need, when you need it.   You may see any of the following providers on your designated Care Team at your next follow up: [name] Dr Arvilla Meres . Dr Marca Ancona . Tonye Becket, NP . Robbie Lis, PA . Karle Plumber, PharmD   Please be sure to bring in all your medications bottles to every appointment.
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
## 2019-06-04 ENCOUNTER — Other Ambulatory Visit: Payer: Self-pay | Admitting: Cardiovascular Disease

## 2019-06-07 ENCOUNTER — Encounter (HOSPITAL_COMMUNITY): Payer: Self-pay

## 2019-06-11 ENCOUNTER — Other Ambulatory Visit: Payer: Self-pay

## 2019-06-11 ENCOUNTER — Ambulatory Visit (HOSPITAL_COMMUNITY)
Admission: RE | Admit: 2019-06-11 | Discharge: 2019-06-11 | Disposition: A | Discharge status: Home / Self Care | Payer: 59 | Source: Ambulatory Visit | Attending: Cardiology | Admitting: Cardiology

## 2019-06-11 DIAGNOSIS — I25110 Atherosclerotic heart disease of native coronary artery with unstable angina pectoris: Secondary | ICD-10-CM | POA: Diagnosis not present

## 2019-06-11 DIAGNOSIS — I110 Hypertensive heart disease with heart failure: Secondary | ICD-10-CM | POA: Insufficient documentation

## 2019-06-11 DIAGNOSIS — I252 Old myocardial infarction: Secondary | ICD-10-CM | POA: Insufficient documentation

## 2019-06-11 DIAGNOSIS — Z95810 Presence of automatic (implantable) cardiac defibrillator: Secondary | ICD-10-CM | POA: Insufficient documentation

## 2019-06-11 DIAGNOSIS — E785 Hyperlipidemia, unspecified: Secondary | ICD-10-CM | POA: Diagnosis not present

## 2019-06-11 DIAGNOSIS — I5022 Chronic systolic (congestive) heart failure: Secondary | ICD-10-CM

## 2019-06-11 DIAGNOSIS — I509 Heart failure, unspecified: Secondary | ICD-10-CM | POA: Insufficient documentation

## 2019-06-11 DIAGNOSIS — I255 Ischemic cardiomyopathy: Secondary | ICD-10-CM | POA: Insufficient documentation

## 2019-06-11 LAB — BASIC METABOLIC PANEL
Anion gap: 9 (ref 5–15)
BUN: 10 mg/dL (ref 6–20)
CO2: 24 mmol/L (ref 22–32)
Calcium: 9.2 mg/dL (ref 8.9–10.3)
Chloride: 105 mmol/L (ref 98–111)
Creatinine, Ser: 1.08 mg/dL (ref 0.61–1.24)
GFR calc Af Amer: >60 mL/min (ref >60)
GFR calc non Af Amer: >60 mL/min (ref >60)
Glucose, Bld: 97 mg/dL (ref 70–99)
Potassium: 4.3 mmol/L (ref 3.5–5.1)
Sodium: 138 mmol/L (ref 135–145)

## 2019-06-11 NOTE — Progress Notes (Signed)
Echocardiogram 2D Echocardiogram has been performed.  Jason Hogan A Jason Hogan 06/11/2019, 8:46 AM

## 2019-06-11 NOTE — Addendum Note (Signed)
Encounter addended by: Modesta Messing, CMA on: 06/11/2019 1:15 PM  Actions taken: Care Teams modified

## 2019-06-29 ENCOUNTER — Other Ambulatory Visit: Payer: Self-pay | Admitting: Internal Medicine

## 2019-07-21 ENCOUNTER — Other Ambulatory Visit: Payer: Self-pay

## 2019-07-21 ENCOUNTER — Ambulatory Visit (INDEPENDENT_AMBULATORY_CARE_PROVIDER_SITE_OTHER): Payer: 59 | Admitting: Family Medicine

## 2019-07-21 ENCOUNTER — Encounter: Payer: Self-pay | Admitting: Family Medicine

## 2019-07-21 VITALS — BP 100/80 | HR 77 | Ht 74.0 in | Wt 221.0 lb

## 2019-07-21 DIAGNOSIS — G8929 Other chronic pain: Secondary | ICD-10-CM

## 2019-07-21 DIAGNOSIS — M25511 Pain in right shoulder: Secondary | ICD-10-CM | POA: Diagnosis not present

## 2019-07-21 DIAGNOSIS — M2550 Pain in unspecified joint: Secondary | ICD-10-CM

## 2019-07-21 DIAGNOSIS — M10271 Drug-induced gout, right ankle and foot: Secondary | ICD-10-CM

## 2019-07-21 MED ORDER — KETOROLAC TROMETHAMINE 30 MG/ML IJ SOLN
30.0000 mg | Freq: Once | INTRAMUSCULAR | Status: AC
  Administered 2019-07-21: 30 mg via INTRAMUSCULAR

## 2019-07-21 MED ORDER — RAYOS 5 MG PO TBEC: 1.0000 | DELAYED_RELEASE_TABLET | Freq: Every evening | ORAL | 0 refills | Status: DC

## 2019-07-21 MED ORDER — KETOROLAC TROMETHAMINE 30 MG/ML IJ SOLN: 30.0000 mg | Freq: Once | INTRAMUSCULAR | Status: DC

## 2019-07-21 MED ORDER — PREDNISONE 5 MG PO TBEC: 5.0000 mg | DELAYED_RELEASE_TABLET | Freq: Every day | ORAL | 0 refills | Status: DC

## 2019-07-21 MED ORDER — COLCHICINE 0.6 MG PO TABS: 0.6000 mg | ORAL_TABLET | Freq: Every day | ORAL | 0 refills | Status: DC

## 2019-07-21 MED ORDER — METHYLPREDNISOLONE ACETATE 40 MG/ML IJ SUSP
40.0000 mg | Freq: Once | INTRAMUSCULAR | Status: AC
  Administered 2019-07-21: 40 mg via INTRAMUSCULAR

## 2019-07-21 NOTE — Progress Notes (Signed)
[facility] [address] [place] [address] Phone: [phone] Subjective:   I Jason Hogan am serving as a Neurosurgeon for Dr. Antoine Primas.  This visit occurred during the SARS-CoV-2 public health emergency.  Safety protocols were in place, including screening questions prior to the visit, additional usage of staff PPE, and extensive cleaning of exam room while observing appropriate contact time as indicated for disinfecting solutions.   I'm seeing this patient by the request  of:  Corwin Levins, MD  CC: Bilateral shoulder pain, toe pain, all over pain  [id number]   05/26/2019 Significant improvement at this time.  6 weeks out from PRP.  Increase activity slowly.  We discussed taking a step back secondary to some of the discomfort he was having.  Discussed avoiding certain impingement type movements.  Follow-up with me again in 2 months  Update 07/21/2019 Jason Hogan is a 44 y.o. male coming in with complaint of left shoulder pain. Patient states he is doing a little worse. Pain with ADLs. Sunday he was building a fence gate and that night he had really bad migraines. Twisting a screw driver also causes pain the next day.  Patient states that he is having increasing discomfort and pain again.  Patient feels like some of the pain is potentially his gout as well.  Did have an exacerbation last week.  Has not been eating quite as healthy as he normally does either.      Past Medical History:  Diagnosis Date  . Acute Myocardial Infarction 08/18/2009   Qualifier: Diagnosis of  By: Kem Parkinson    . Allergic rhinitis, cause unspecified 10/03/2011  . Anxiety 10/03/2011  . Asthma 09/20/2006   Qualifier: Diagnosis of  By: Jonny Ruiz MD, Len Blalock   . 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 Qualifier: Diagnosis of  By: Eden Emms, MD, Harrington Challenger   . CAD (coronary artery disease)    a. large  anterior MI 2011 s/p PTCA/DES to 100%. b. occluded LAD with occluded collateralized silent RCA, balloon angioplasty to stented segment of LAD and DES x1 to mid circumflex in 10/2017.  [name] Cholelithiasis 06/22/2007   Qualifier: Diagnosis of  By: Maris Berger   . Chronic systolic heart failure (HCC) 09/18/2009   Qualifier: Diagnosis of  By: Kem Parkinson    . Depressive disorder, not elsewhere classified 06/22/2007   Qualifier: Diagnosis of  By: Maris Berger   . History of gout   . History of kidney stones   . Hyperlipidemia 81/2011  . Hypertension   . Impaired glucose tolerance 10/05/2012  . Insomnia, unspecified 06/22/2007   Qualifier: Diagnosis of  By: Jonny Ruiz MD, Len Blalock   . Ischemic cardiomyopathy 10/20/2009   Qualifier: Diagnosis of  By: Graciela Husbands, MD, [facility], Ty Hilts   . 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 Migraine Headache 09/18/2009   Qualifier: Diagnosis of  By: Kem Parkinson    . Renal calculus   . Renal Insufficiency 09/18/2009   Qualifier: Diagnosis of  By: Kem Parkinson    . Respiratory failure (HCC) 08/2009  . Rhabdomyolysis 09/18/2009   Qualifier: Diagnosis of  By: Kem Parkinson    . VT (ventricular tachycardia) (HCC)    Past Surgical History:  Procedure Laterality Date  . CARDIAC CATHETERIZATION N/A 05/17/2015   Procedure: Left Heart Cath and Coronary Angiography;  Surgeon: Wendall Stade, MD;  Location: [facility] INVASIVE CV LAB;  Service: Cardiovascular;  Laterality: N/A;  . CARDIAC CATHETERIZATION  2012  . CORONARY ANGIOPLASTY WITH STENT PLACEMENT  2011  . CORONARY STENT INTERVENTION N/A 11/14/2017   Procedure: CORONARY STENT INTERVENTION;  Surgeon: Burnell Blanks, MD;  Location: [place] CV LAB;  Service: Cardiovascular;  Laterality: N/A;  . ICD IMPLANT  11/2009   "w/pacing function"  . RIGHT/LEFT HEART CATH AND CORONARY ANGIOGRAPHY N/A 11/14/2017   Procedure: RIGHT/LEFT HEART CATH AND CORONARY ANGIOGRAPHY;  Surgeon: Burnell Blanks, MD;  Location: [place] CV LAB;  Service: Cardiovascular;  Laterality: N/A;   Social History   Socioeconomic History  . Marital status: Married    Spouse name: Not on file  . Number of children: 0  . Years of education: Not on file  . Highest education level: Not on file  Occupational History  . 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 Smoking status: Former Smoker    Packs/day: 0.10    Years: 2.00    Pack years: 0.20    Types: Cigarettes    Quit date: 02/18/1994    Years since quitting: 25.4  . Smokeless tobacco: Never Used  Substance and Sexual Activity  . Alcohol use: Yes    Comment: 11/25/2017 "couple beers/month; if that"  . Drug use: Never  . Sexual activity: Yes  Other Topics Concern  . Not on file  Social History Narrative  . Not on file   Social Determinants of Health   Financial Resource Strain:   . Difficulty of Paying Living Expenses:   Food Insecurity:   . Worried About Charity fundraiser in the Last Year:   . Arboriculturist in the Last Year:   Transportation Needs:   . Film/video editor (Medical):   [name] Lack of Transportation (Non-Medical):   Physical Activity:   . Days of Exercise per Week:   . Minutes of Exercise per Session:   Stress:   . Feeling of Stress :   Social Connections:   . Frequency of Communication with Friends and Family:   . Frequency of Social Gatherings with Friends and Family:   . Attends Religious Services:   . Active Member of Clubs or Organizations:   . Attends Archivist Meetings:   [name] Marital Status:    Allergies  Allergen Reactions  . Levaquin [Levofloxacin] Other (See Comments)    Joint pain  . Ace Inhibitors Cough  . Crestor [Rosuvastatin] Other (See Comments)    "Sweet cravings"  . Lunesta [Eszopiclone] Other (See Comments)    "Bitter taste in mouth"  . Zocor [Simvastatin] Other (See Comments)    Memory problem   Family History  Problem Relation Age of  Onset  . Heart attack Father   . Heart disease Other   . Diabetes Other   . Hypothyroidism Other     Current Outpatient Medications (Endocrine & Metabolic):  .  predniSONE (RAYOS) 5 MG TBEC, Take 1 tablet by mouth at bedtime. .  predniSONE 5 MG TBEC, Take 5 mg by mouth at bedtime.  Current Outpatient Medications (Cardiovascular):  .  carvedilol (COREG) 12.5 MG tablet, Take 1 tablet (12.5 mg total) by mouth 2 (two) times daily. .  Evolocumab (REPATHA SURECLICK) 532 MG/ML SOAJ, Inject 1 pen into the skin every 14 (fourteen) days. [name]  ezetimibe (ZETIA) 10 MG tablet, Take 1 tablet (10 mg total) by mouth daily. .  metolazone (ZAROXOLYN) 5 MG tablet, TAKE 1 TABLET (5 MG TOTAL)  BY MOUTH DAILY AS NEEDED (FOR EDEMA). .  mexiletine (MEXITIL) 200 MG capsule, TAKE 1 CAPSULE BY MOUTH TWICE A DAY .  sacubitril-valsartan (ENTRESTO) 97-103 MG, Take 1 tablet by mouth 2 (two) times daily. [name]  spironolactone (ALDACTONE) 25 MG tablet, Take 1 tablet (25 mg total) by mouth at bedtime. .  torsemide (DEMADEX) 20 MG tablet, Take 1 tablet (20 mg total) by mouth daily as needed. .  nitroGLYCERIN (NITROSTAT) 0.4 MG SL tablet, Place 1 tablet (0.4 mg total) under the tongue every 5 (five) minutes as needed for chest pain. (Patient not taking: Reported on 05/28/2019)  Current Outpatient Medications (Respiratory):  .  albuterol (VENTOLIN HFA) 108 (90 Base) MCG/ACT inhaler, Inhale 1-2 puffs into the lungs every 6 (six) hours as needed for wheezing or shortness of breath. .  Fluticasone-Salmeterol (WIXELA INHUB) 250-50 MCG/DOSE AEPB, Inhale 1 puff into the lungs 2 (two) times daily. .  Budesonide 90 MCG/ACT inhaler, Inhale 1 puff into the lungs 2 (two) times daily.  Current Outpatient Medications (Analgesics):  .  aspirin EC 81 MG tablet, Take 81 mg by mouth daily. .  colchicine 0.6 MG tablet, Take 1 tablet (0.6 mg total) by mouth daily.  Current Outpatient Medications (Hematological):  .  prasugrel (EFFIENT) 10 MG TABS  tablet, Take 1 tablet (10 mg total) by mouth daily.  Current Outpatient Medications (Other):  [name]  ALPRAZolam (XANAX) 0.5 MG tablet, Take 1 tablet (0.5 mg total) by mouth daily as needed for anxiety. [name]  anastrozole (ARIMIDEX) 1 MG tablet, Take 1 mg by mouth daily. .  Coenzyme Q10 (CO Q 10) 100 MG CAPS, Take 100 mg by mouth daily.  [name]  KLOR-CON M20 20 MEQ tablet, TAKE 2 TABLETS BY MOUTH EVERY DAY .  sertraline (ZOLOFT) 25 MG tablet, Take 1 tablet (25 mg total) by mouth daily. [name]  tiZANidine (ZANAFLEX) 4 MG tablet, TAKE 1 TABLET BY MOUTH TWICE A DAY AS NEEDED .  Vitamin D, Ergocalciferol, (DRISDOL) 1.25 MG (50000 UNIT) CAPS capsule, Take 1 capsule (50,000 Units total) by mouth every 7 (seven) days.   Reviewed prior external information including notes and imaging from  primary care provider As well as notes that were available from care everywhere and other healthcare systems.  Past medical history, social, surgical and family history all reviewed in electronic medical record.  No pertanent information unless stated regarding to the chief complaint.   Review of Systems:  No headache, visual changes, nausea, vomiting, diarrhea, constipation, dizziness, abdominal pain, skin rash, fevers, chills, night sweats, weight loss, swollen lymph nodes, body aches, joint swelling, chest pain, shortness of breath, mood changes. POSITIVE muscle aches  Objective  Blood pressure 100/80, pulse 77, height 6\' 2"  (1.88 m), weight 221 lb (100.2 kg), SpO2 98 %.   General: No apparent distress alert and oriented x3 mood and affect normal, dressed appropriately.  HEENT: Pupils equal, extraocular movements intact  Respiratory: Patient's speak in full sentences and does not appear short of breath  Cardiovascular: No lower extremity edema, non tender, no erythema  Neuro: Cranial nerves II through XII are intact, neurovascularly intact in all extremities with 2+ DTRs and 2+ pulses.  Gait normal with good balance and  coordination.  MSK:  tender to palpation in many different areas.  Patient's right shoulder still shows some very mild positive impingement.  Rotator cuff strength appears to be intact.     Impression and Recommendations:     The above documentation has been reviewed and is accurate  and complete Lyndal Pulley, DO       Note: This dictation was prepared with Dragon dictation along with smaller phrase technology. Any transcriptional errors that result from this process are unintentional.

## 2019-07-21 NOTE — Patient Instructions (Addendum)
Good to see you Refilled colchicine  Rayos take 5 pills at night first 2 nights. Then 4 pills, 3 pills, 2 pills etc. After prednisone take colchicine daily for a week See me again in 3-4 weeks

## 2019-07-21 NOTE — Assessment & Plan Note (Addendum)
Chronic problem with exacerbation.  Toradol and Depo-Medrol given today.  Tolerated the procedure well, we discussed with patien but due to patient's other chronic problems we will do a longer acting steroid at a lower dose to see if this will be beneficial.  We discussed the titration.  Patient will start decreasing as he is getting it.  Giving him a sample today.  Discussed icing regimen and home exercises.  Follow-up with me again in 3 to 4 weeks

## 2019-07-21 NOTE — Assessment & Plan Note (Signed)
Continued polyarthralgia likely secondary to more than gout.  Long-acting steroids given today.  We will see how patient does.  Toradol and Depo-Medrol given today for one-time dose.  Hopefully this will be beneficial.  Patient will follow up with me again in 4 to 8 weeks.  Avoid oral anti-inflammatories for long amount of time if possible.

## 2019-07-21 NOTE — Assessment & Plan Note (Signed)
Appears to be more secondary to gout.  Since that patient's rotator cuff and bicep tendon seems to be doing relatively well.

## 2019-08-02 ENCOUNTER — Encounter: Payer: Self-pay | Admitting: Family Medicine

## 2019-08-10 ENCOUNTER — Ambulatory Visit (INDEPENDENT_AMBULATORY_CARE_PROVIDER_SITE_OTHER): Payer: 59 | Admitting: *Deleted

## 2019-08-10 DIAGNOSIS — I255 Ischemic cardiomyopathy: Secondary | ICD-10-CM | POA: Diagnosis not present

## 2019-08-11 ENCOUNTER — Telehealth: Payer: Self-pay

## 2019-08-11 NOTE — Telephone Encounter (Signed)
Spoke with patient to remind of missed remote transmission 

## 2019-08-12 ENCOUNTER — Other Ambulatory Visit: Payer: Self-pay

## 2019-08-12 ENCOUNTER — Encounter: Payer: Self-pay | Admitting: Family Medicine

## 2019-08-12 ENCOUNTER — Ambulatory Visit (INDEPENDENT_AMBULATORY_CARE_PROVIDER_SITE_OTHER): Payer: 59 | Admitting: Family Medicine

## 2019-08-12 DIAGNOSIS — M722 Plantar fascial fibromatosis: Secondary | ICD-10-CM | POA: Diagnosis not present

## 2019-08-12 DIAGNOSIS — M10271 Drug-induced gout, right ankle and foot: Secondary | ICD-10-CM

## 2019-08-12 LAB — CUP PACEART REMOTE DEVICE CHECK
Battery Remaining Longevity: 22 mo
Battery Remaining Percentage: 21 %
Battery Voltage: 2.78 V
Brady Statistic RV Percent Paced: 1 %
Date Time Interrogation Session: 20210624052835
HighPow Impedance: 49 Ohm
Implantable Lead Implant Date: 20111010
Implantable Lead Location: 753860
Implantable Lead Model: 7121
Implantable Pulse Generator Implant Date: 20111010
Lead Channel Impedance Value: 1050 Ohm
Lead Channel Pacing Threshold Amplitude: 2 V
Lead Channel Pacing Threshold Pulse Width: 1 ms
Lead Channel Sensing Intrinsic Amplitude: 11.9 mV
Lead Channel Setting Pacing Amplitude: 4 V
Lead Channel Setting Pacing Pulse Width: 1 ms
Lead Channel Setting Sensing Sensitivity: 0.5 mV
Pulse Gen Serial Number: 615318

## 2019-08-12 MED ORDER — ALLOPURINOL 100 MG PO TABS: 200.0000 mg | ORAL_TABLET | Freq: Every day | ORAL | 1 refills | Status: DC

## 2019-08-12 MED ORDER — LESINURAD-ALLOPURINOL 200-200 MG PO TABS: 1.0000 | ORAL_TABLET | Freq: Once | ORAL | 0 refills | Status: AC

## 2019-08-12 NOTE — Patient Instructions (Addendum)
Allopurinol 200 mg bridge with colchicine for 7 days then stop colchicine Exercise 3 times a week Ok to lift weight some go for toning not building Continue shoes in the house for heel Send message in 2 weeks see me in 6 if not healed

## 2019-08-12 NOTE — Assessment & Plan Note (Signed)
Continues mildly.  Patient though states that it is seems to be getting a little better.  Discussed shoes and the home exercises discussed rigid soled shoes be more beneficial.  Follow-up again in 4 to 6 weeks if worsening pain consider injection or formal physical therapy

## 2019-08-12 NOTE — Progress Notes (Signed)
[facility] [address] [place] [address] Phone: [phone] Subjective:   I Jason Hogan am serving as a Neurosurgeon for Dr. Antoine Primas.  This visit occurred during the SARS-CoV-2 public health emergency.  Safety protocols were in place, including screening questions prior to the visit, additional usage of staff PPE, and extensive cleaning of exam room while observing appropriate contact time as indicated for disinfecting solutions.   I'm seeing this patient by the request  of:  Corwin Levins, MD  CC: Foot pain, all over pain follow-up  [id number]   07/21/2019 Chronic problem with exacerbation.  Toradol and Depo-Medrol given today.  Tolerated the procedure well, we discussed with patien but due to patient's other chronic problems we will do a longer acting steroid at a lower dose to see if this will be beneficial.  We discussed the titration.  Patient will start decreasing as he is getting it.  Giving him a sample today.  Discussed icing regimen and home exercises.  Follow-up with me again in 3 to 4 weeks  08/12/2019 Jason Hogan is a 44 y.o. male coming in with complaint of shoulder pain and gout. Heel gives him pain in the mornings. Big toe is stiff. Colchicine cramps his stomach but has been taking. Pain in multiple joints.  Does believe he is moving in the right direction.  Colchicine has been helpful but is never without any type of pain at the moment.  Patient was doing better when he was taking other medications other than colchicine which included the allopurinol 90.     Past Medical History:  Diagnosis Date  . Acute Myocardial Infarction 08/18/2009   Qualifier: Diagnosis of  By: Kem Parkinson    . Allergic rhinitis, cause unspecified 10/03/2011  . Anxiety 10/03/2011  . Asthma 09/20/2006   Qualifier: Diagnosis of  By: Jonny Ruiz MD, Len Blalock   . Automatic implantable cardiac defibrillator in situ 01/17/2010   "With pacing function"  (11/25/2017). Qualifier: Diagnosis of  By: Eden Emms, MD, Harrington Challenger   . CAD (coronary artery disease)    a. large anterior MI 2011 s/p PTCA/DES to 100%. b. occluded LAD with occluded collateralized silent RCA, balloon angioplasty to stented segment of LAD and DES x1 to mid circumflex in 10/2017.  [name] Cholelithiasis 06/22/2007   Qualifier: Diagnosis of  By: Maris Berger   . Chronic systolic heart failure (HCC) 09/18/2009   Qualifier: Diagnosis of  By: Kem Parkinson    . Depressive disorder, not elsewhere classified 06/22/2007   Qualifier: Diagnosis of  By: Maris Berger   . History of gout   . History of kidney stones   . Hyperlipidemia 81/2011  . Hypertension   . Impaired glucose tolerance 10/05/2012  . Insomnia, unspecified 06/22/2007   Qualifier: Diagnosis of  By: Jonny Ruiz MD, Len Blalock   . Ischemic cardiomyopathy 10/20/2009   Qualifier: Diagnosis of  By: Graciela Husbands, MD, [facility], Ty Hilts   . 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 Migraine Headache 09/18/2009   Qualifier: Diagnosis of  By: Kem Parkinson    . Renal calculus   . Renal Insufficiency 09/18/2009   Qualifier: Diagnosis of  By: Kem Parkinson    . Respiratory failure (HCC) 08/2009  . Rhabdomyolysis 09/18/2009   Qualifier: Diagnosis of  By: Kem Parkinson    . VT (ventricular tachycardia) (HCC)    Past Surgical History:  Procedure Laterality Date  . CARDIAC CATHETERIZATION N/A 05/17/2015   Procedure: Left Heart Cath  and Coronary Angiography;  Surgeon: Wendall Stade, MD;  Location: [facility] INVASIVE CV LAB;  Service: Cardiovascular;  Laterality: N/A;  . CARDIAC CATHETERIZATION  2012  . CORONARY ANGIOPLASTY WITH STENT PLACEMENT  2011  . CORONARY STENT INTERVENTION N/A 11/14/2017   Procedure: CORONARY STENT INTERVENTION;  Surgeon: Kathleene Hazel, MD;  Location: [facility] INVASIVE CV LAB;  Service: Cardiovascular;  Laterality: N/A;  . ICD IMPLANT  11/2009   "w/pacing function"  . RIGHT/LEFT HEART CATH AND  CORONARY ANGIOGRAPHY N/A 11/14/2017   Procedure: RIGHT/LEFT HEART CATH AND CORONARY ANGIOGRAPHY;  Surgeon: Kathleene Hazel, MD;  Location: [facility] INVASIVE CV LAB;  Service: Cardiovascular;  Laterality: N/A;   Social History   Socioeconomic History  . Marital status: Married    Spouse name: Not on file  . Number of children: 0  . Years of education: Not on file  . Highest education level: Not on file  Occupational History  . 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 Smoking status: Former Smoker    Packs/day: 0.10    Years: 2.00    Pack years: 0.20    Types: Cigarettes    Quit date: 02/18/1994    Years since quitting: 25.4  . Smokeless tobacco: Never Used  Vaping Use  . Vaping Use: Never used  Substance and Sexual Activity  . Alcohol use: Yes    Comment: 11/25/2017 "couple beers/month; if that"  . Drug use: Never  . Sexual activity: Yes  Other Topics Concern  . Not on file  Social History Narrative  . Not on file   Social Determinants of Health   Financial Resource Strain:   . Difficulty of Paying Living Expenses:   Food Insecurity:   . Worried About Programme researcher, broadcasting/film/video in the Last Year:   . Barista in the Last Year:   Transportation Needs:   . Freight forwarder (Medical):   [name] Lack of Transportation (Non-Medical):   Physical Activity:   . Days of Exercise per Week:   . Minutes of Exercise per Session:   Stress:   . Feeling of Stress :   Social Connections:   . Frequency of Communication with Friends and Family:   . Frequency of Social Gatherings with Friends and Family:   . Attends Religious Services:   . Active Member of Clubs or Organizations:   . Attends Banker Meetings:   [name] Marital Status:    Allergies  Allergen Reactions  . Levaquin [Levofloxacin] Other (See Comments)    Joint pain  . Ace Inhibitors Cough  . Crestor [Rosuvastatin] Other (See Comments)    "Sweet cravings"  . Lunesta [Eszopiclone]  Other (See Comments)    "Bitter taste in mouth"  . Zocor [Simvastatin] Other (See Comments)    Memory problem   Family History  Problem Relation Age of Onset  . Heart attack Father   . Heart disease Other   . Diabetes Other   . Hypothyroidism Other     Current Outpatient Medications (Endocrine & Metabolic):  .  predniSONE (RAYOS) 5 MG TBEC, Take 1 tablet by mouth at bedtime. .  predniSONE 5 MG TBEC, Take 5 mg by mouth at bedtime.  Current Outpatient Medications (Cardiovascular):  .  carvedilol (COREG) 12.5 MG tablet, Take 1 tablet (12.5 mg total) by mouth 2 (two) times daily. .  Evolocumab (REPATHA SURECLICK) 140 MG/ML SOAJ, Inject 1 pen into the skin every 14 (fourteen) days. [name]  ezetimibe (ZETIA) 10 MG tablet, Take 1 tablet (10 mg total) by mouth daily. .  metolazone (ZAROXOLYN) 5 MG tablet, TAKE 1 TABLET (5 MG TOTAL) BY MOUTH DAILY AS NEEDED (FOR EDEMA). .  mexiletine (MEXITIL) 200 MG capsule, TAKE 1 CAPSULE BY MOUTH TWICE A DAY .  sacubitril-valsartan (ENTRESTO) 97-103 MG, Take 1 tablet by mouth 2 (two) times daily. [name]  spironolactone (ALDACTONE) 25 MG tablet, Take 1 tablet (25 mg total) by mouth at bedtime. .  torsemide (DEMADEX) 20 MG tablet, Take 1 tablet (20 mg total) by mouth daily as needed. .  nitroGLYCERIN (NITROSTAT) 0.4 MG SL tablet, Place 1 tablet (0.4 mg total) under the tongue every 5 (five) minutes as needed for chest pain. (Patient not taking: Reported on 05/28/2019)  Current Outpatient Medications (Respiratory):  .  albuterol (VENTOLIN HFA) 108 (90 Base) MCG/ACT inhaler, Inhale 1-2 puffs into the lungs every 6 (six) hours as needed for wheezing or shortness of breath. .  Fluticasone-Salmeterol (WIXELA INHUB) 250-50 MCG/DOSE AEPB, Inhale 1 puff into the lungs 2 (two) times daily. .  Budesonide 90 MCG/ACT inhaler, Inhale 1 puff into the lungs 2 (two) times daily.  Current Outpatient Medications (Analgesics):  .  aspirin EC 81 MG tablet, Take 81 mg by mouth daily. .   colchicine 0.6 MG tablet, Take 1 tablet (0.6 mg total) by mouth daily. [name]  Lesinurad-Allopurinol 200-200 MG TABS, Take 1 tablet by mouth once for 1 dose.  Current Outpatient Medications (Hematological):  .  prasugrel (EFFIENT) 10 MG TABS tablet, Take 1 tablet (10 mg total) by mouth daily.  Current Outpatient Medications (Other):  [name]  ALPRAZolam (XANAX) 0.5 MG tablet, Take 1 tablet (0.5 mg total) by mouth daily as needed for anxiety. [name]  anastrozole (ARIMIDEX) 1 MG tablet, Take 1 mg by mouth daily. .  Coenzyme Q10 (CO Q 10) 100 MG CAPS, Take 100 mg by mouth daily.  [name]  KLOR-CON M20 20 MEQ tablet, TAKE 2 TABLETS BY MOUTH EVERY DAY .  sertraline (ZOLOFT) 25 MG tablet, Take 1 tablet (25 mg total) by mouth daily. [name]  tiZANidine (ZANAFLEX) 4 MG tablet, TAKE 1 TABLET BY MOUTH TWICE A DAY AS NEEDED .  Vitamin D, Ergocalciferol, (DRISDOL) 1.25 MG (50000 UNIT) CAPS capsule, Take 1 capsule (50,000 Units total) by mouth every 7 (seven) days.   Reviewed prior external information including notes and imaging from  primary care provider As well as notes that were available from care everywhere and other healthcare systems.  Past medical history, social, surgical and family history all reviewed in electronic medical record.  No pertanent information unless stated regarding to the chief complaint.   Review of Systems:  No headache, visual changes, nausea, vomiting, diarrhea, constipation, dizziness, abdominal pain, skin rash, fevers, chills, night sweats, weight loss, swollen lymph nodes, body aches, joint swelling, chest pain, shortness of breath, mood changes. POSITIVE muscle aches  Objective  Blood pressure 130/90, pulse 78, height 6\' 2"  (1.88 m), weight 220 lb (99.8 kg), SpO2 97 %.   General: No apparent distress alert and oriented x3 mood and affect normal, dressed appropriately.  HEENT: Pupils equal, extraocular movements intact  Respiratory: Patient's speak in full sentences and does not appear short  of breath  Cardiovascular: No lower extremity edema, non tender, no erythema  Neuro: Cranial nerves II through XII are intact, neurovascularly intact in all extremities with 2+ DTRs and 2+ pulses.  Gait normal with good balance and coordination.  MSK: Foot exam shows the patient  is tender to palpation over the medial calcaneal area.  Patient has some very mild swelling noted of the first MTP on the right foot but nothing severe. Impression and Recommendations:     The above documentation has been reviewed and is accurate and complete Judi Saa, DO       Note: This dictation was prepared with Dragon dictation along with smaller phrase technology. Any transcriptional errors that result from this process are unintentional.

## 2019-08-12 NOTE — Progress Notes (Signed)
Remote ICD transmission.   

## 2019-08-12 NOTE — Assessment & Plan Note (Signed)
Patient continues to have some pain with the elevation of the sedimentation rate and likely elevation of the uric acid.  Patient will be started again on allopurinol which I think will be more beneficial for him.  Patient will then discontinue the colchicine on a daily basis and use it more for flares.  Patient was having better success with this previously.  Discussed with patient about icing regimen and home exercises.  Follow-up again in 4 to 8 weeks

## 2019-08-18 ENCOUNTER — Encounter (HOSPITAL_COMMUNITY): Payer: Self-pay

## 2019-08-18 ENCOUNTER — Other Ambulatory Visit (HOSPITAL_COMMUNITY): Payer: Self-pay | Admitting: Cardiology

## 2019-09-01 ENCOUNTER — Encounter: Payer: Self-pay | Admitting: Family Medicine

## 2019-09-01 ENCOUNTER — Other Ambulatory Visit: Payer: Self-pay | Admitting: Internal Medicine

## 2019-09-01 ENCOUNTER — Other Ambulatory Visit: Payer: Self-pay

## 2019-09-01 ENCOUNTER — Ambulatory Visit (HOSPITAL_COMMUNITY)
Admission: RE | Admit: 2019-09-01 | Discharge: 2019-09-01 | Disposition: A | Discharge status: Home / Self Care | Payer: 59 | Source: Ambulatory Visit | Attending: Cardiology | Admitting: Cardiology

## 2019-09-01 ENCOUNTER — Encounter (HOSPITAL_COMMUNITY): Payer: Self-pay | Admitting: Cardiology

## 2019-09-01 VITALS — BP 124/92 | HR 82 | Wt 218.0 lb

## 2019-09-01 DIAGNOSIS — D751 Secondary polycythemia: Secondary | ICD-10-CM | POA: Diagnosis not present

## 2019-09-01 DIAGNOSIS — Z7982 Long term (current) use of aspirin: Secondary | ICD-10-CM | POA: Insufficient documentation

## 2019-09-01 DIAGNOSIS — I255 Ischemic cardiomyopathy: Secondary | ICD-10-CM | POA: Diagnosis not present

## 2019-09-01 DIAGNOSIS — I252 Old myocardial infarction: Secondary | ICD-10-CM | POA: Insufficient documentation

## 2019-09-01 DIAGNOSIS — E785 Hyperlipidemia, unspecified: Secondary | ICD-10-CM | POA: Diagnosis not present

## 2019-09-01 DIAGNOSIS — M791 Myalgia, unspecified site: Secondary | ICD-10-CM | POA: Insufficient documentation

## 2019-09-01 DIAGNOSIS — Z7902 Long term (current) use of antithrombotics/antiplatelets: Secondary | ICD-10-CM | POA: Diagnosis not present

## 2019-09-01 DIAGNOSIS — I2510 Atherosclerotic heart disease of native coronary artery without angina pectoris: Secondary | ICD-10-CM | POA: Diagnosis not present

## 2019-09-01 DIAGNOSIS — F419 Anxiety disorder, unspecified: Secondary | ICD-10-CM | POA: Insufficient documentation

## 2019-09-01 DIAGNOSIS — Z8249 Family history of ischemic heart disease and other diseases of the circulatory system: Secondary | ICD-10-CM | POA: Insufficient documentation

## 2019-09-01 DIAGNOSIS — I5022 Chronic systolic (congestive) heart failure: Secondary | ICD-10-CM

## 2019-09-01 DIAGNOSIS — I110 Hypertensive heart disease with heart failure: Secondary | ICD-10-CM | POA: Diagnosis not present

## 2019-09-01 DIAGNOSIS — Z79899 Other long term (current) drug therapy: Secondary | ICD-10-CM | POA: Insufficient documentation

## 2019-09-01 DIAGNOSIS — Z7951 Long term (current) use of inhaled steroids: Secondary | ICD-10-CM | POA: Insufficient documentation

## 2019-09-01 DIAGNOSIS — J45909 Unspecified asthma, uncomplicated: Secondary | ICD-10-CM | POA: Insufficient documentation

## 2019-09-01 DIAGNOSIS — Z955 Presence of coronary angioplasty implant and graft: Secondary | ICD-10-CM | POA: Insufficient documentation

## 2019-09-01 DIAGNOSIS — Z95810 Presence of automatic (implantable) cardiac defibrillator: Secondary | ICD-10-CM | POA: Insufficient documentation

## 2019-09-01 LAB — BASIC METABOLIC PANEL
Anion gap: 9 (ref 5–15)
BUN: 8 mg/dL (ref 6–20)
CO2: 24 mmol/L (ref 22–32)
Calcium: 9.1 mg/dL (ref 8.9–10.3)
Chloride: 104 mmol/L (ref 98–111)
Creatinine, Ser: 1 mg/dL (ref 0.61–1.24)
GFR calc Af Amer: >60 mL/min (ref >60)
GFR calc non Af Amer: >60 mL/min (ref >60)
Glucose, Bld: 108 mg/dL — ABNORMAL HIGH (ref 70–99)
Potassium: 4 mmol/L (ref 3.5–5.1)
Sodium: 137 mmol/L (ref 135–145)

## 2019-09-01 MED ORDER — CARVEDILOL 12.5 MG PO TABS: 18.7500 mg | ORAL_TABLET | Freq: Two times a day (BID) | ORAL | 5 refills | Status: DC

## 2019-09-01 MED ORDER — CLOPIDOGREL BISULFATE 75 MG PO TABS: 75.0000 mg | ORAL_TABLET | Freq: Every day | ORAL | 3 refills | Status: DC
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
## 2019-09-01 NOTE — Patient Instructions (Addendum)
Routine lab work today. Will notify you of abnormal results  Discontinue Prasugrel  Start Plavix 75mg  daily  INCREASE Carvedilol to 18.75mg  twice daily  Follow up in 3 months.  Do the following things EVERYDAY: 1) Weigh yourself in the morning before breakfast. Write it down and keep it in a log. 2) Take your medicines as prescribed 3) Eat low salt foods--Limit salt (sodium) to 2000 mg per day.  4) Stay as active as you can everyday 5) Limit all fluids for the day to less than 2 liters

## 2019-09-09 ENCOUNTER — Other Ambulatory Visit: Payer: Self-pay

## 2019-09-09 ENCOUNTER — Ambulatory Visit (INDEPENDENT_AMBULATORY_CARE_PROVIDER_SITE_OTHER): Payer: 59 | Admitting: Family Medicine

## 2019-09-09 ENCOUNTER — Ambulatory Visit: Payer: Self-pay

## 2019-09-09 ENCOUNTER — Encounter: Payer: Self-pay | Admitting: Family Medicine

## 2019-09-09 VITALS — BP 110/80 | HR 73 | Ht 74.0 in | Wt 218.0 lb

## 2019-09-09 DIAGNOSIS — M722 Plantar fascial fibromatosis: Secondary | ICD-10-CM | POA: Diagnosis not present

## 2019-09-09 DIAGNOSIS — M79672 Pain in left foot: Secondary | ICD-10-CM

## 2019-09-09 DIAGNOSIS — G8929 Other chronic pain: Secondary | ICD-10-CM

## 2019-09-09 NOTE — Progress Notes (Signed)
[facility] [address] [place] [address] Phone: [phone] Subjective:   I Jason Hogan am serving as a Neurosurgeon for Dr. Antoine Primas.  This visit occurred during the SARS-CoV-2 public health emergency.  Safety protocols were in place, including screening questions prior to the visit, additional usage of staff PPE, and extensive cleaning of exam room while observing appropriate contact time as indicated for disinfecting solutions.   I'm seeing this patient by the request  of:  Corwin Levins, MD  CC: Left heel pain  IZT:[id number]   08/12/2019 Continues mildly.  Patient though states that it is seems to be getting a little better.  Discussed shoes and the home exercises discussed rigid soled shoes be more beneficial.  Follow-up again in 4 to 6 weeks if worsening pain consider injection or formal physical therapy  09/09/2019 Jason Hogan is a 44 y.o. male coming in with complaint of left foot. Patient states last night and yesterday with a lot of sitting/driving he limps. Overall he is getting better.  Patient does have symptoms that were corresponding with more of a plantar fasciitis.  Doing the exercises occasionally but continues to have the pain on a daily basis.  Worse with the first dose in the morning. Past Medical History:  Diagnosis Date  . Acute Myocardial Infarction 08/18/2009   Qualifier: Diagnosis of  By: Kem Parkinson    . Allergic rhinitis, cause unspecified 10/03/2011  . Anxiety 10/03/2011  . Asthma 09/20/2006   Qualifier: Diagnosis of  By: Jonny Ruiz MD, Len Blalock   . 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 Qualifier: Diagnosis of  By: Eden Emms, MD, Harrington Challenger   . CAD (coronary artery disease)    a. large anterior MI 2011 s/p PTCA/DES to 100%. b. occluded LAD with occluded collateralized silent RCA, balloon angioplasty to stented segment of LAD and DES x1 to mid  circumflex in 10/2017.  [name] Cholelithiasis 06/22/2007   Qualifier: Diagnosis of  By: Maris Berger   . Chronic systolic heart failure (HCC) 09/18/2009   Qualifier: Diagnosis of  By: Kem Parkinson    . Depressive disorder, not elsewhere classified 06/22/2007   Qualifier: Diagnosis of  By: Maris Berger   . History of gout   . History of kidney stones   . Hyperlipidemia 81/2011  . Hypertension   . Impaired glucose tolerance 10/05/2012  . Insomnia, unspecified 06/22/2007   Qualifier: Diagnosis of  By: Jonny Ruiz MD, Len Blalock   . Ischemic cardiomyopathy 10/20/2009   Qualifier: Diagnosis of  By: Graciela Husbands, MD, [facility], Ty Hilts   . 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 Migraine Headache 09/18/2009   Qualifier: Diagnosis of  By: Kem Parkinson    . Renal calculus   . Renal Insufficiency 09/18/2009   Qualifier: Diagnosis of  By: Kem Parkinson    . Respiratory failure (HCC) 08/2009  . Rhabdomyolysis 09/18/2009   Qualifier: Diagnosis of  By: Kem Parkinson    . VT (ventricular tachycardia) (HCC)    Past Surgical History:  Procedure Laterality Date  . CARDIAC CATHETERIZATION N/A 05/17/2015   Procedure: Left Heart Cath and Coronary Angiography;  Surgeon: Wendall Stade, MD;  Location: [facility] INVASIVE CV LAB;  Service: Cardiovascular;  Laterality: N/A;  . CARDIAC CATHETERIZATION  2012  . CORONARY ANGIOPLASTY WITH STENT PLACEMENT  2011  . CORONARY STENT INTERVENTION N/A 11/14/2017   Procedure: CORONARY STENT INTERVENTION;  Surgeon: Kathleene Hazel, MD;  Location: [facility] INVASIVE CV LAB;  Service: Cardiovascular;  Laterality: N/A;  . ICD IMPLANT  11/2009   "w/pacing function"  . RIGHT/LEFT HEART CATH AND CORONARY ANGIOGRAPHY N/A 11/14/2017   Procedure: RIGHT/LEFT HEART CATH AND CORONARY ANGIOGRAPHY;  Surgeon: Kathleene Hazel, MD;  Location: [facility] INVASIVE CV LAB;  Service: Cardiovascular;  Laterality: N/A;   Social History   Socioeconomic History  . Marital status: Married     Spouse name: Not on file  . Number of children: 0  . Years of education: Not on file  . Highest education level: Not on file  Occupational History  . 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 Smoking status: Former Smoker    Packs/day: 0.10    Years: 2.00    Pack years: 0.20    Types: Cigarettes    Quit date: 02/18/1994    Years since quitting: 25.5  . Smokeless tobacco: Never Used  Vaping Use  . Vaping Use: Never used  Substance and Sexual Activity  . Alcohol use: Yes    Comment: 11/25/2017 "couple beers/month; if that"  . Drug use: Never  . Sexual activity: Yes  Other Topics Concern  . Not on file  Social History Narrative  . Not on file   Social Determinants of Health   Financial Resource Strain:   . Difficulty of Paying Living Expenses:   Food Insecurity:   . Worried About Programme researcher, broadcasting/film/video in the Last Year:   . Barista in the Last Year:   Transportation Needs:   . Freight forwarder (Medical):   [name] Lack of Transportation (Non-Medical):   Physical Activity:   . Days of Exercise per Week:   . Minutes of Exercise per Session:   Stress:   . Feeling of Stress :   Social Connections:   . Frequency of Communication with Friends and Family:   . Frequency of Social Gatherings with Friends and Family:   . Attends Religious Services:   . Active Member of Clubs or Organizations:   . Attends Banker Meetings:   [name] Marital Status:    Allergies  Allergen Reactions  . Levaquin [Levofloxacin] Other (See Comments)    Joint pain  . Ace Inhibitors Cough  . Crestor [Rosuvastatin] Other (See Comments)    "Sweet cravings"  . Lunesta [Eszopiclone] Other (See Comments)    "Bitter taste in mouth"  . Zocor [Simvastatin] Other (See Comments)    Memory problem   Family History  Problem Relation Age of Onset  . Heart attack Father   . Heart disease Other   . Diabetes Other   . Hypothyroidism Other     Current Outpatient  Medications (Endocrine & Metabolic):  .  predniSONE 5 MG TBEC, Take 5 mg by mouth at bedtime.  Current Outpatient Medications (Cardiovascular):  .  carvedilol (COREG) 12.5 MG tablet, Take 1.5 tablets (18.75 mg total) by mouth 2 (two) times daily. .  Evolocumab (REPATHA SURECLICK) 140 MG/ML SOAJ, Inject 1 pen into the skin every 14 (fourteen) days. [name]  ezetimibe (ZETIA) 10 MG tablet, Take 1 tablet (10 mg total) by mouth daily. .  metolazone (ZAROXOLYN) 5 MG tablet, TAKE 1 TABLET (5 MG TOTAL) BY MOUTH DAILY AS NEEDED (FOR EDEMA). .  mexiletine (MEXITIL) 200 MG capsule, TAKE 1 CAPSULE BY MOUTH TWICE A DAY .  sacubitril-valsartan (ENTRESTO) 97-103 MG, Take 1 tablet by mouth 2 (two) times daily. [name]  spironolactone (ALDACTONE) 25  MG tablet, Take 1 tablet (25 mg total) by mouth at bedtime. .  torsemide (DEMADEX) 20 MG tablet, Take 1 tablet (20 mg total) by mouth daily as needed. .  nitroGLYCERIN (NITROSTAT) 0.4 MG SL tablet, Place 1 tablet (0.4 mg total) under the tongue every 5 (five) minutes as needed for chest pain. (Patient not taking: Reported on 05/28/2019)  Current Outpatient Medications (Respiratory):  .  albuterol (VENTOLIN HFA) 108 (90 Base) MCG/ACT inhaler, Inhale 1-2 puffs into the lungs every 6 (six) hours as needed for wheezing or shortness of breath. .  Fluticasone-Salmeterol (WIXELA INHUB) 250-50 MCG/DOSE AEPB, Inhale 1 puff into the lungs 2 (two) times daily. .  Budesonide 90 MCG/ACT inhaler, Inhale 1 puff into the lungs 2 (two) times daily.  Current Outpatient Medications (Analgesics):  .  allopurinol (ZYLOPRIM) 100 MG tablet, Take 2 tablets (200 mg total) by mouth daily. [name]  aspirin EC 81 MG tablet, Take 81 mg by mouth daily. .  colchicine 0.6 MG tablet, Take 1 tablet (0.6 mg total) by mouth daily.  Current Outpatient Medications (Hematological):  .  clopidogrel (PLAVIX) 75 MG tablet, Take 1 tablet (75 mg total) by mouth daily.  Current Outpatient Medications (Other):  [name]   ALPRAZolam (XANAX) 0.5 MG tablet, Take 1 tablet (0.5 mg total) by mouth daily as needed for anxiety. [name]  anastrozole (ARIMIDEX) 1 MG tablet, Take 1 mg by mouth daily. .  Coenzyme Q10 (CO Q 10) 100 MG CAPS, Take 100 mg by mouth daily.  [name]  KLOR-CON M20 20 MEQ tablet, TAKE 2 TABLETS BY MOUTH EVERY DAY .  sertraline (ZOLOFT) 25 MG tablet, TAKE ONE TABLET BY MOUTH DAILY .  tiZANidine (ZANAFLEX) 4 MG tablet, TAKE ONE TABLET BY MOUTH TWICE A DAY AS NEEDED .  Vitamin D, Ergocalciferol, (DRISDOL) 1.25 MG (50000 UNIT) CAPS capsule, Take 1 capsule (50,000 Units total) by mouth every 7 (seven) days.   Reviewed prior external information including notes and imaging from  primary care provider As well as notes that were available from care everywhere and other healthcare systems.  Past medical history, social, surgical and family history all reviewed in electronic medical record.  No pertanent information unless stated regarding to the chief complaint.   Review of Systems:  No headache, visual changes, nausea, vomiting, diarrhea, constipation, dizziness, abdominal pain, skin rash, fevers, chills, night sweats, weight loss, swollen lymph nodes, body aches, joint swelling, chest pain, shortness of breath, mood changes. POSITIVE muscle aches  Objective  Blood pressure 110/80, pulse 73, height 6\' 2"  (1.88 m), weight (!) 218 lb (98.9 kg), SpO2 97 %.   General: No apparent distress alert and oriented x3 mood and affect normal, dressed appropriately.  HEENT: Pupils equal, extraocular movements intact  Mild antalgic when patient gets up from a sitting position.  Significant pes planus with overpronation of the hindfoot noted.  Procedure: Real-time Ultrasound Guided Injection of left plantar fascial Device: GE Logiq Q7 Ultrasound guided injection is preferred based studies that show increased duration, increased effect, greater accuracy, decreased procedural pain, increased response rate, and decreased cost with  ultrasound guided versus blind injection.  Verbal informed consent obtained.  Time-out conducted.  Noted no overlying erythema, induration, or other signs of local infection.  Skin prepped in a sterile fashion.  Local anesthesia: Topical Ethyl chloride.  With sterile technique and under real time ultrasound guidance: With a 25-gauge 1-1/2 inch needle injected with 0.5 cc of 0.5% Marcaine and 0.5 cc of Kenalog 40 mg/mL at the  insertion at the calcaneal area. Completed without difficulty  Pain immediately resolved suggesting accurate placement of the medication.  Advised to call if fevers/chills, erythema, induration, drainage, or persistent bleeding.  Images permanently stored and available for review in the ultrasound unit.  Impression: Technically successful ultrasound guided injection.    Impression and Recommendations:     The above documentation has been reviewed and is accurate and complete Judi Saa, DO       Note: This dictation was prepared with Dragon dictation along with smaller phrase technology. Any transcriptional errors that result from this process are unintentional.

## 2019-09-09 NOTE — Assessment & Plan Note (Signed)
Patient given injection, tolerated procedure well, discussed icing regimen and home exercise, which activities to do next Wednesday avoid.  Patient is to encourage him to wear the over-the-counter orthotic.  Has been given a brace previously.  No significant pes planus tenderness likely contributing does have the underlying gout likely contributing.  Follow-up in 4 to 8 weeks

## 2019-09-09 NOTE — Patient Instructions (Addendum)
Good to see you Injected the left foot today Have a great time in [place] See me again in 5-6 weeks

## 2019-09-24 ENCOUNTER — Ambulatory Visit: Payer: 59 | Admitting: Podiatry

## 2019-09-24 ENCOUNTER — Ambulatory Visit: Payer: 59 | Admitting: Family Medicine

## 2019-09-27 ENCOUNTER — Ambulatory Visit: Payer: 59 | Admitting: Podiatry

## 2019-09-29 ENCOUNTER — Encounter (HOSPITAL_COMMUNITY): Payer: Self-pay

## 2019-09-30 ENCOUNTER — Telehealth (HOSPITAL_COMMUNITY): Payer: Self-pay | Admitting: Pharmacy Technician

## 2019-09-30 ENCOUNTER — Other Ambulatory Visit (HOSPITAL_COMMUNITY): Payer: Self-pay

## 2019-09-30 MED ORDER — SACUBITRIL-VALSARTAN 97-103 MG PO TABS: 1.0000 | ORAL_TABLET | Freq: Two times a day (BID) | ORAL | 3 refills | Status: DC

## 2019-09-30 MED ORDER — CLOPIDOGREL BISULFATE 75 MG PO TABS: 75.0000 mg | ORAL_TABLET | Freq: Every day | ORAL | 3 refills | Status: DC

## 2019-09-30 NOTE — Telephone Encounter (Signed)
-----   Message from Laurey Morale, MD sent at 09/29/2019  6:17 PM EDT ----- Jason Hogan never got his Plavix prescription.  Can someone call him and send it in (to replace Effient).   He also needs help with Sherryll Burger patient assistance Leotis Shames, can you address this).   Thanks.

## 2019-09-30 NOTE — Telephone Encounter (Signed)
Patient Advocate Encounter   Received notification from [employer] that prior authorization for Sherryll Burger is required.   PA submitted on CoverMyMeds Key [id number] Status is pending   Will continue to follow.

## 2019-09-30 NOTE — Telephone Encounter (Signed)
Advanced Heart Failure Patient Advocate Encounter  Prior Authorization for Jason Hogan has been approved.    PA#  [id number] Effective dates: 09/30/19 through 09/29/20  Patients co-pay is $200  It looks like the patient has a co-pay card already activated. I will call him and make sure he has that or give him the phone number to call to obtain the billing information for the pharmacy.

## 2019-10-04 NOTE — Telephone Encounter (Signed)
Spoke with the patient and provided the phone number to [facility] co-pay card support.  Archer Asa, CPhT

## 2019-10-06 ENCOUNTER — Encounter: Payer: Self-pay | Admitting: Podiatry

## 2019-10-06 ENCOUNTER — Other Ambulatory Visit: Payer: Self-pay | Admitting: Podiatry

## 2019-10-06 ENCOUNTER — Ambulatory Visit (INDEPENDENT_AMBULATORY_CARE_PROVIDER_SITE_OTHER): Payer: 59

## 2019-10-06 ENCOUNTER — Other Ambulatory Visit: Payer: Self-pay

## 2019-10-06 ENCOUNTER — Ambulatory Visit (INDEPENDENT_AMBULATORY_CARE_PROVIDER_SITE_OTHER): Payer: 59 | Admitting: Podiatry

## 2019-10-06 VITALS — Temp 97.2°F

## 2019-10-06 DIAGNOSIS — M2042 Other hammer toe(s) (acquired), left foot: Secondary | ICD-10-CM

## 2019-10-06 DIAGNOSIS — M1A00X0 Idiopathic chronic gout, unspecified site, without tophus (tophi): Secondary | ICD-10-CM

## 2019-10-06 DIAGNOSIS — M722 Plantar fascial fibromatosis: Secondary | ICD-10-CM

## 2019-10-06 DIAGNOSIS — M79672 Pain in left foot: Secondary | ICD-10-CM | POA: Diagnosis not present

## 2019-10-06 NOTE — Progress Notes (Signed)
Subjective:   Patient ID: Jason Hogan, male   DOB: 44 y.o.   MRN: [id number]   HPI Patient states he is developed a lot of pain in the bottom of his left heel and its been there 6 months.  It started after playing golf and patient states that he has tried injections once without relief and it is worse when he gets up in the morning and after periods of sitting and he does walk a lot on cement floors.  Patient does not smoke likes to be active   Review of Systems  All other systems reviewed and are negative.       Objective:  Physical Exam Vitals and nursing note reviewed.  Constitutional:      Appearance: He is well-developed.  Pulmonary:     Effort: Pulmonary effort is normal.  Musculoskeletal:        General: Normal range of motion.  Skin:    General: Skin is warm.  Neurological:     Mental Status: He is alert.     Neurovascular status intact muscle strength found to be adequate range of motion within normal limits.  Patient is found to have exquisite discomfort plantar aspect left heel and in the center of the heel at the insertion tendon calcaneus with inflammation fluid buildup around the area with moderate depression of the arch.  Patient is found to have good digital perfusion well oriented x3     Assessment:  Acute plantar fasciitis left with inflammation fluid buildup     Plan:  H&P condition reviewed x-ray reviewed and today I went ahead educated him on condition did sterile prep injected the plantar fascia 3 mg Kenalog 5 mg Xylocaine dispensed night splint to sleep bed and to use for 30-minute periods along with aggressive ice and advised on physical therapy shoe gear modifications.  Reappoint 2 weeks  X-rays indicate there is spurring no indications of stress fracture arthritis

## 2019-10-06 NOTE — Patient Instructions (Addendum)
Plantar Fasciitis (Heel Spur Syndrome) with Rehab The plantar fascia is a fibrous, ligament-like, soft-tissue structure that spans the bottom of the foot. Plantar fasciitis is a condition that causes pain in the foot due to inflammation of the tissue. SYMPTOMS   Pain and tenderness on the underneath side of the foot.  Pain that worsens with standing or walking. CAUSES  Plantar fasciitis is caused by irritation and injury to the plantar fascia on the underneath side of the foot. Common mechanisms of injury include:  Direct trauma to bottom of the foot.  Damage to a small nerve that runs under the foot where the main fascia attaches to the heel bone.  Stress placed on the plantar fascia due to bone spurs. RISK INCREASES WITH:   Activities that place stress on the plantar fascia (running, jumping, pivoting, or cutting).  Poor strength and flexibility.  Improperly fitted shoes.  Tight calf muscles.  Flat feet.  Failure to warm-up properly before activity.  Obesity. PREVENTION  Warm up and stretch properly before activity.  Allow for adequate recovery between workouts.  Maintain physical fitness:  Strength, flexibility, and endurance.  Cardiovascular fitness.  Maintain a health body weight.  Avoid stress on the plantar fascia.  Wear properly fitted shoes, including arch supports for individuals who have flat feet.  PROGNOSIS  If treated properly, then the symptoms of plantar fasciitis usually resolve without surgery. However, occasionally surgery is necessary.  RELATED COMPLICATIONS   Recurrent symptoms that may result in a chronic condition.  Problems of the lower back that are caused by compensating for the injury, such as limping.  Pain or weakness of the foot during push-off following surgery.  Chronic inflammation, scarring, and partial or complete fascia tear, occurring more often from repeated injections.  TREATMENT  Treatment initially involves the  use of ice and medication to help reduce pain and inflammation. The use of strengthening and stretching exercises may help reduce pain with activity, especially stretches of the Achilles tendon. These exercises may be performed at home or with a therapist. Your caregiver may recommend that you use heel cups of arch supports to help reduce stress on the plantar fascia. Occasionally, corticosteroid injections are given to reduce inflammation. If symptoms persist for greater than 6 months despite non-surgical (conservative), then surgery may be recommended.   MEDICATION   If pain medication is necessary, then nonsteroidal anti-inflammatory medications, such as aspirin and ibuprofen, or other minor pain relievers, such as acetaminophen, are often recommended.  Do not take pain medication within 7 days before surgery.  Prescription pain relievers may be given if deemed necessary by your caregiver. Use only as directed and only as much as you need.  Corticosteroid injections may be given by your caregiver. These injections should be reserved for the most serious cases, because they may only be given a certain number of times.  HEAT AND COLD  Cold treatment (icing) relieves pain and reduces inflammation. Cold treatment should be applied for 10 to 15 minutes every 2 to 3 hours for inflammation and pain and immediately after any activity that aggravates your symptoms. Use ice packs or massage the area with a piece of ice (ice massage).  Heat treatment may be used prior to performing the stretching and strengthening activities prescribed by your caregiver, physical therapist, or athletic trainer. Use a heat pack or soak the injury in warm water.  SEEK IMMEDIATE MEDICAL CARE IF:  Treatment seems to offer no benefit, or the condition worsens.  Any medications   produce adverse side effects.  EXERCISES- RANGE OF MOTION (ROM) AND STRETCHING EXERCISES - Plantar Fasciitis (Heel Spur Syndrome) These exercises  may help you when beginning to rehabilitate your injury. Your symptoms may resolve with or without further involvement from your physician, physical therapist or athletic trainer. While completing these exercises, remember:   Restoring tissue flexibility helps normal motion to return to the joints. This allows healthier, less painful movement and activity.  An effective stretch should be held for at least 30 seconds.  A stretch should never be painful. You should only feel a gentle lengthening or release in the stretched tissue.  RANGE OF MOTION - Toe Extension, Flexion  Sit with your right / left leg crossed over your opposite knee.  Grasp your toes and gently pull them back toward the top of your foot. You should feel a stretch on the bottom of your toes and/or foot.  Hold this stretch for 10 seconds.  Now, gently pull your toes toward the bottom of your foot. You should feel a stretch on the top of your toes and or foot.  Hold this stretch for 10 seconds. Repeat  times. Complete this stretch 3 times per day.   RANGE OF MOTION - Ankle Dorsiflexion, Active Assisted  Remove shoes and sit on a chair that is preferably not on a carpeted surface.  Place right / left foot under knee. Extend your opposite leg for support.  Keeping your heel down, slide your right / left foot back toward the chair until you feel a stretch at your ankle or calf. If you do not feel a stretch, slide your bottom forward to the edge of the chair, while still keeping your heel down.  Hold this stretch for 10 seconds. Repeat 3 times. Complete this stretch 2 times per day.   STRETCH  Gastroc, Standing  Place hands on wall.  Extend right / left leg, keeping the front knee somewhat bent.  Slightly point your toes inward on your back foot.  Keeping your right / left heel on the floor and your knee straight, shift your weight toward the wall, not allowing your back to arch.  You should feel a gentle stretch  in the right / left calf. Hold this position for 10 seconds. Repeat 3 times. Complete this stretch 2 times per day.  STRETCH  Soleus, Standing  Place hands on wall.  Extend right / left leg, keeping the other knee somewhat bent.  Slightly point your toes inward on your back foot.  Keep your right / left heel on the floor, bend your back knee, and slightly shift your weight over the back leg so that you feel a gentle stretch deep in your back calf.  Hold this position for 10 seconds. Repeat 3 times. Complete this stretch 2 times per day.  STRETCH  Gastrocsoleus, Standing  Note: This exercise can place a lot of stress on your foot and ankle. Please complete this exercise only if specifically instructed by your caregiver.   Place the ball of your right / left foot on a step, keeping your other foot firmly on the same step.  Hold on to the wall or a rail for balance.  Slowly lift your other foot, allowing your body weight to press your heel down over the edge of the step.  You should feel a stretch in your right / left calf.  Hold this position for 10 seconds.  Repeat this exercise with a slight bend in your right /   left knee. Repeat 3 times. Complete this stretch 2 times per day.   STRENGTHENING EXERCISES - Plantar Fasciitis (Heel Spur Syndrome)  These exercises may help you when beginning to rehabilitate your injury. They may resolve your symptoms with or without further involvement from your physician, physical therapist or athletic trainer. While completing these exercises, remember:   Muscles can gain both the endurance and the strength needed for everyday activities through controlled exercises.  Complete these exercises as instructed by your physician, physical therapist or athletic trainer. Progress the resistance and repetitions only as guided.  STRENGTH - Towel Curls  Sit in a chair positioned on a non-carpeted surface.  Place your foot on a towel, keeping your heel  on the floor.  Pull the towel toward your heel by only curling your toes. Keep your heel on the floor. Repeat 3 times. Complete this exercise 2 times per day.  STRENGTH - Ankle Inversion  Secure one end of a rubber exercise band/tubing to a fixed object (table, pole). Loop the other end around your foot just before your toes.  Place your fists between your knees. This will focus your strengthening at your ankle.  Slowly, pull your big toe up and in, making sure the band/tubing is positioned to resist the entire motion.  Hold this position for 10 seconds.  Have your muscles resist the band/tubing as it slowly pulls your foot back to the starting position. Repeat 3 times. Complete this exercises 2 times per day.  Document Released: 02/04/2005 Document Revised: 04/29/2011 Document Reviewed: 05/19/2008 [facility] Patient Information 2014 [place], [place]. Low-Purine Eating Plan A low-purine eating plan involves making food choices to limit your intake of purine. Purine is a kind of uric acid. Too much uric acid in your blood can cause certain conditions, such as gout and kidney stones. Eating a low-purine diet can help control these conditions. What are tips for following this plan? Reading food labels   Avoid foods with saturated or Trans fat.  Check the ingredient list of grains-based foods, such as bread and cereal, to make sure that they contain whole grains.  Check the ingredient list of sauces or soups to make sure they do not contain meat or fish.  When choosing soft drinks, check the ingredient list to make sure they do not contain high-fructose corn syrup. Shopping  Buy plenty of fresh fruits and vegetables.  Avoid buying canned or fresh fish.  Buy dairy products labeled as low-fat or nonfat.  Avoid buying premade or processed foods. These foods are often high in fat, salt (sodium), and added sugar. Cooking  Use olive oil instead of butter when cooking. Oils like olive  oil, canola oil, and sunflower oil contain healthy fats. Meal planning  Learn which foods do or do not affect you. If you find out that a food tends to cause your gout symptoms to flare up, avoid eating that food. You can enjoy foods that do not cause problems. If you have any questions about a food item, talk with your dietitian or health care provider.  Limit foods high in fat, especially saturated fat. Fat makes it harder for your body to get rid of uric acid.  Choose foods that are lower in fat and are lean sources of protein. General guidelines  Limit alcohol intake to no more than 1 drink a day for nonpregnant women and 2 drinks a day for men. One drink equals 12 oz of beer, 5 oz of wine, or 1 oz of hard  liquor. Alcohol can affect the way your body gets rid of uric acid.  Drink plenty of water to keep your urine clear or pale yellow. Fluids can help remove uric acid from your body.  If directed by your health care provider, take a vitamin C supplement.  Work with your health care provider and dietitian to develop a plan to achieve or maintain a healthy weight. Losing weight can help reduce uric acid in your blood. What foods are recommended? The items listed may not be a complete list. Talk with your dietitian about what dietary choices are best for you. Foods low in purines Foods low in purines do not need to be limited. These include:  All fruits.  All low-purine vegetables, pickles, and olives.  Breads, pasta, rice, cornbread, and popcorn. Cake and other baked goods.  All dairy foods.  Eggs, nuts, and nut butters.  Spices and condiments, such as salt, herbs, and vinegar.  Plant oils, butter, and margarine.  Water, sugar-free soft drinks, tea, coffee, and cocoa.  Vegetable-based soups, broths, sauces, and gravies. Foods moderate in purines Foods moderate in purines should be limited to the amounts listed.   cup of asparagus, cauliflower, spinach, mushrooms, or  green peas, each day.  2/3 cup uncooked oatmeal, each day.   cup dry wheat bran or wheat germ, each day.  2-3 ounces of meat or poultry, each day.  4-6 ounces of shellfish, such as crab, lobster, oysters, or shrimp, each day.  1 cup cooked beans, peas, or lentils, each day.  Soup, broths, or bouillon made from meat or fish. Limit these foods as much as possible. What foods are not recommended? The items listed may not be a complete list. Talk with your dietitian about what dietary choices are best for you. Limit your intake of foods high in purines, including:  Beer and other alcohol.  Meat-based gravy or sauce.  Canned or fresh fish, such as: ? Anchovies, sardines, herring, and tuna. ? Mussels and scallops. ? Codfish, trout, and haddock.  Tomasa Blase.  Organ meats, such as: ? Liver or kidney. ? Tripe. ? Sweetbreads (thymus gland or pancreas).  Wild Education officer, environmental.  Yeast or yeast extract supplements.  Drinks sweetened with high-fructose corn syrup. Summary  Eating a low-purine diet can help control conditions caused by too much uric acid in the body, such as gout or kidney stones.  Choose low-purine foods, limit alcohol, and limit foods high in fat.  You will learn over time which foods do or do not affect you. If you find out that a food tends to cause your gout symptoms to flare up, avoid eating that food. This information is not intended to replace advice given to you by your health care provider. Make sure you discuss any questions you have with your health care provider. Document Revised: 01/17/2017 Document Reviewed: 03/20/2016 Elsevier Patient Education  2020 [employer].

## 2019-10-13 ENCOUNTER — Other Ambulatory Visit: Payer: Self-pay | Admitting: Cardiovascular Disease

## 2019-10-18 NOTE — Progress Notes (Signed)
[name] [address] [place] [address] Phone: [phone] Subjective:   [facility], am serving as a scribe for [facility]. This visit occurred during the SARS-CoV-2 public health emergency.  Safety protocols were in place, including screening questions prior to the visit, additional usage of staff PPE, and extensive cleaning of exam room while observing appropriate contact time as indicated for disinfecting solutions.   I'm seeing this patient by the request  of:  Jason Levins, MD  CC: Back pain, foot pain follow-up  [id number]   09/09/2019 Patient given injection, tolerated procedure well, discussed icing regimen and home exercise, which activities to do next Wednesday avoid.  Patient is to encourage him to wear the over-the-counter orthotic.  Has been given a brace previously.  No significant pes planus tenderness likely contributing does have the underlying gout likely contributing.  Follow-up in 4 to 8 weeks  Update 10/19/2019 [facility] is a 44 y.o. male coming in with complaint of left heel pain.10/06/2019. Saw Dr. Charlsie Hogan for ongoing plantar fascial pain. Patient given injection and night splint. Patient states that his pain is improving. Did try to play golf and used elliptical yesterday. Feels soreness in foot today. Appt with [facility] tomorrow.      Past Medical History:  Diagnosis Date  . Acute Myocardial Infarction 08/18/2009   Qualifier: Diagnosis of  By: [facility]    . Allergic rhinitis, cause unspecified 10/03/2011  . Anxiety 10/03/2011  . Asthma 09/20/2006   Qualifier: Diagnosis of  By: Jason Ruiz MD, Len Blalock   . 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 Qualifier: Diagnosis of  By: Jason Emms, MD, [facility]   . CAD (coronary artery disease)    a. large anterior MI 2011 s/p PTCA/DES to 100%. b. occluded LAD with occluded collateralized silent RCA,  balloon angioplasty to stented segment of LAD and DES x1 to mid circumflex in 10/2017.  [name] Cholelithiasis 06/22/2007   Qualifier: Diagnosis of  By: [facility]   . Chronic systolic heart failure (HCC) 09/18/2009   Qualifier: Diagnosis of  By: [facility]    . Depressive disorder, not elsewhere classified 06/22/2007   Qualifier: Diagnosis of  By: [facility]   . History of gout   . History of kidney stones   . Hyperlipidemia 81/2011  . Hypertension   . Impaired glucose tolerance 10/05/2012  . Insomnia, unspecified 06/22/2007   Qualifier: Diagnosis of  By: Jason Ruiz MD, Len Blalock   . Ischemic cardiomyopathy 10/20/2009   Qualifier: Diagnosis of  By: Jason Husbands, MD, [facility]   . 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 Migraine Headache 09/18/2009   Qualifier: Diagnosis of  By: [facility]    . Renal calculus   . Renal Insufficiency 09/18/2009   Qualifier: Diagnosis of  By: [facility]    . Respiratory failure (HCC) 08/2009  . Rhabdomyolysis 09/18/2009   Qualifier: Diagnosis of  By: [facility]    . VT (ventricular tachycardia) (HCC)    Past Surgical History:  Procedure Laterality Date  . CARDIAC CATHETERIZATION N/A 05/17/2015   Procedure: Left Heart Cath and Coronary Angiography;  Surgeon: Jason Stade, MD;  [facility];  Service: Cardiovascular;  Laterality: N/A;  . CARDIAC CATHETERIZATION  2012  . CORONARY ANGIOPLASTY WITH STENT PLACEMENT  2011  . CORONARY STENT INTERVENTION N/A 11/14/2017   Procedure: CORONARY STENT INTERVENTION;  Surgeon: [facility]  D, MD;  Location: [facility];  Service: Cardiovascular;  Laterality: N/A;  . ICD IMPLANT  11/2009   "w/pacing function"  . RIGHT/LEFT HEART CATH AND CORONARY ANGIOGRAPHY N/A 11/14/2017   Procedure: RIGHT/LEFT HEART CATH AND CORONARY ANGIOGRAPHY;  Surgeon: Jason Hazel, MD;  Location: [facility];  Service: Cardiovascular;  Laterality: N/A;   Social  History   Socioeconomic History  . Marital status: Married    Spouse name: Not on file  . Number of children: 0  . Years of education: Not on file  . Highest education level: Not on file  Occupational History  . 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 Smoking status: Former Smoker    Packs/day: 0.10    Years: 2.00    Pack years: 0.20    Types: Cigarettes    Quit date: 02/18/1994    Years since quitting: 25.6  . Smokeless tobacco: Never Used  Vaping Use  . Vaping Use: Never used  Substance and Sexual Activity  . Alcohol use: Yes    Comment: 11/25/2017 "couple beers/month; if that"  . Drug use: Never  . Sexual activity: Yes  Other Topics Concern  . Not on file  Social History Narrative  . Not on file   Social Determinants of Health   Financial Resource Strain:   . Difficulty of Paying Living Expenses: Not on file  Food Insecurity:   . Worried About [facility] in the Last Year: Not on file  . Ran Out of Food in the Last Year: Not on file  Transportation Needs:   . Lack of Transportation (Medical): Not on file  . Lack of Transportation (Non-Medical): Not on file  Physical Activity:   . Days of Exercise per Week: Not on file  . Minutes of Exercise per Session: Not on file  Stress:   . Feeling of Stress : Not on file  Social Connections:   . Frequency of Communication with Friends and Family: Not on file  . Frequency of Social Gatherings with Friends and Family: Not on file  . Attends Religious Services: Not on file  . Active Member of Clubs or Organizations: Not on file  . Attends Banker Meetings: Not on file  . Marital Status: Not on file   Allergies  Allergen Reactions  . Levaquin [Levofloxacin] Other (See Comments)    Joint pain  . Ace Inhibitors Cough  . Crestor [Rosuvastatin] Other (See Comments)    "Sweet cravings"  . Lunesta [Eszopiclone] Other (See Comments)    "Bitter taste in mouth"  . Zocor [Simvastatin]  Other (See Comments)    Memory problem   Family History  Problem Relation Age of Onset  . Heart attack Father   . Heart disease Other   . Diabetes Other   . Hypothyroidism Other     Current Outpatient Medications (Endocrine & Metabolic):  .  predniSONE 5 MG TBEC, Take 5 mg by mouth at bedtime.  Current Outpatient Medications (Cardiovascular):  .  carvedilol (COREG) 12.5 MG tablet, Take 1.5 tablets (18.75 mg total) by mouth 2 (two) times daily. .  Evolocumab (REPATHA SURECLICK) 140 MG/ML SOAJ, Inject 1 pen into the skin every 14 (fourteen) days. [name]  ezetimibe (ZETIA) 10 MG tablet, Take 1 tablet (10 mg total) by mouth daily. .  metolazone (ZAROXOLYN) 5 MG tablet, TAKE 1 TABLET (5 MG TOTAL) BY MOUTH DAILY AS NEEDED (FOR EDEMA). .  mexiletine (MEXITIL)  200 MG capsule, TAKE 1 CAPSULE BY MOUTH TWICE A DAY .  nitroGLYCERIN (NITROSTAT) 0.4 MG SL tablet, Place 1 tablet (0.4 mg total) under the tongue every 5 (five) minutes as needed for chest pain. (Patient not taking: Reported on 05/28/2019) .  sacubitril-valsartan (ENTRESTO) 97-103 MG, Take 1 tablet by mouth 2 (two) times daily. [name]  spironolactone (ALDACTONE) 25 MG tablet, Take 1 tablet (25 mg total) by mouth at bedtime. .  torsemide (DEMADEX) 20 MG tablet, Take 1 tablet (20 mg total) by mouth daily as needed.  Current Outpatient Medications (Respiratory):  .  albuterol (VENTOLIN HFA) 108 (90 Base) MCG/ACT inhaler, Inhale 1-2 puffs into the lungs every 6 (six) hours as needed for wheezing or shortness of breath. [name]  azelastine (ASTELIN) 0.1 % nasal spray, Place into both nostrils. .  Budesonide 90 MCG/ACT inhaler, Inhale 1 puff into the lungs 2 (two) times daily. .  Fluticasone-Salmeterol (WIXELA INHUB) 250-50 MCG/DOSE AEPB, Inhale 1 puff into the lungs 2 (two) times daily.  Current Outpatient Medications (Analgesics):  .  allopurinol (ZYLOPRIM) 100 MG tablet, Take 2 tablets (200 mg total) by mouth daily. [name]  aspirin EC 81 MG tablet, Take 81 mg  by mouth daily. .  colchicine 0.6 MG tablet, Take 1 tablet (0.6 mg total) by mouth daily.  Current Outpatient Medications (Hematological):  .  clopidogrel (PLAVIX) 75 MG tablet, Take 1 tablet (75 mg total) by mouth daily.  Current Outpatient Medications (Other):  [name]  ALPRAZolam (XANAX) 0.5 MG tablet, Take 1 tablet (0.5 mg total) by mouth daily as needed for anxiety. [name]  anastrozole (ARIMIDEX) 1 MG tablet, Take 1 mg by mouth daily. .  Coenzyme Q10 (CO Q 10) 100 MG CAPS, Take 100 mg by mouth daily.  [name]  KLOR-CON M20 20 MEQ tablet, TAKE 2 TABLETS BY MOUTH EVERY DAY .  sertraline (ZOLOFT) 25 MG tablet, TAKE ONE TABLET BY MOUTH DAILY .  tiZANidine (ZANAFLEX) 4 MG tablet, TAKE ONE TABLET BY MOUTH TWICE A DAY AS NEEDED .  Vitamin D, Ergocalciferol, (DRISDOL) 1.25 MG (50000 UNIT) CAPS capsule, Take 1 capsule (50,000 Units total) by mouth every 7 (seven) days.   Reviewed prior external information including notes and imaging from  primary care provider As well as notes that were available from care everywhere and other healthcare systems.  Past medical history, social, surgical and family history all reviewed in electronic medical record.  No pertanent information unless stated regarding to the chief complaint.   Review of Systems:  No headache, visual changes, nausea, vomiting, diarrhea, constipation, dizziness, abdominal pain, skin rash, fevers, chills, night sweats, weight loss, swollen lymph nodes, body aches, joint swelling, chest pain, shortness of breath, mood changes. POSITIVE muscle aches  Objective  Blood pressure 110/78, pulse 100, height 6\' 2"  (1.88 m), weight 223 lb (101.2 kg), SpO2 97 %.   General: No apparent distress alert and oriented x3 mood and affect normal, dressed appropriately.  HEENT: Pupils equal, extraocular movements intact  Respiratory: Patient's speak in full sentences and does not appear short of breath  Cardiovascular: No lower extremity edema, non tender, no  erythema  Neuro: Cranial nerves II through XII are intact, neurovascularly intact in all extremities with 2+ DTRs and 2+ pulses.  Gait normal with good balance and coordination.  MSK:  Non tender with full range of motion and good stability and symmetric strength and tone of shoulders, elbows, wrist, hip, knee and ankles bilaterally.  Foot exam shows the patient does complain splint  is noted.  Patient does have tenderness over the heel itself.  Ltd MSK preformed and interpreted by Judi Saa  Limited musculoskeletal ultrasound of patient's heel shows that patient's plantar fascial significantly decreased in size with 0.12 cm now at this time.  Patient does have some hypoechoic changes of the fat pad noted more superficial than previously.   Impression and Recommendations:     The above documentation has been reviewed and is accurate and complete Judi Saa, DO       Note: This dictation was prepared with Dragon dictation along with smaller phrase technology. Any transcriptional errors that result from this process are unintentional.

## 2019-10-19 ENCOUNTER — Other Ambulatory Visit: Payer: Self-pay

## 2019-10-19 ENCOUNTER — Ambulatory Visit: Payer: Self-pay

## 2019-10-19 ENCOUNTER — Encounter: Payer: Self-pay | Admitting: Family Medicine

## 2019-10-19 ENCOUNTER — Ambulatory Visit (INDEPENDENT_AMBULATORY_CARE_PROVIDER_SITE_OTHER): Payer: 59 | Admitting: Family Medicine

## 2019-10-19 VITALS — BP 110/78 | HR 100 | Ht 74.0 in | Wt 223.0 lb

## 2019-10-19 DIAGNOSIS — M79672 Pain in left foot: Secondary | ICD-10-CM

## 2019-10-19 DIAGNOSIS — G8929 Other chronic pain: Secondary | ICD-10-CM | POA: Diagnosis not present

## 2019-10-19 NOTE — Patient Instructions (Signed)
Foot looks much better Did manipulation  See me in 6-8 weeks

## 2019-10-20 ENCOUNTER — Other Ambulatory Visit: Payer: Self-pay

## 2019-10-20 ENCOUNTER — Encounter: Payer: Self-pay | Admitting: Podiatry

## 2019-10-20 ENCOUNTER — Ambulatory Visit (INDEPENDENT_AMBULATORY_CARE_PROVIDER_SITE_OTHER): Payer: 59 | Admitting: Podiatry

## 2019-10-20 VITALS — Temp 97.7°F

## 2019-10-20 DIAGNOSIS — M722 Plantar fascial fibromatosis: Secondary | ICD-10-CM | POA: Diagnosis not present

## 2019-10-20 NOTE — Progress Notes (Signed)
Subjective:   Patient ID: Len Blalock, male   DOB: 44 y.o.   MRN: [id number]   HPI Patient states his pain has reduced quite a bit but he would like to get more active and states he also gets shinsplints when he tries to be active and states that his flat feet have always been a problem for him   ROS      Objective:  Physical Exam  Neurovascular status intact with patient found to have diminishment of discomfort in the plantar fascial left with pain still present upon deep palpation but improved quite significantly.  Does have flatfoot deformity with history of shinsplints and other pathology      Assessment:  History of acute plantar fasciitis with mild chronic plantar fasciitis and flatfoot deformity with tendinitis-like symptomatology     Plan:  H&P reviewed both conditions.  I spent a great deal of time going over anti-inflammatories physical therapy stretching and the utilization of orthotics for long-term.  I do think he would do better in a rigid type orthotic with  possible elevation of the arch.  I am going to have him see ped orthotist to make this determination and casted him for a functional orthotic device.  I do think cushioning and deep heel seat would be of great benefit to.  Spent a great deal of time with him today educating him on condition and treatment options

## 2019-10-26 ENCOUNTER — Other Ambulatory Visit: Payer: Self-pay

## 2019-10-26 ENCOUNTER — Ambulatory Visit: Payer: 59 | Admitting: Internal Medicine

## 2019-10-26 ENCOUNTER — Encounter: Payer: Self-pay | Admitting: Internal Medicine

## 2019-10-26 VITALS — BP 120/80 | HR 78 | Temp 97.8°F | Ht 74.0 in | Wt 224.0 lb

## 2019-10-26 DIAGNOSIS — F419 Anxiety disorder, unspecified: Secondary | ICD-10-CM

## 2019-10-26 DIAGNOSIS — R7302 Impaired glucose tolerance (oral): Secondary | ICD-10-CM | POA: Diagnosis not present

## 2019-10-26 DIAGNOSIS — Z23 Encounter for immunization: Secondary | ICD-10-CM

## 2019-10-26 DIAGNOSIS — I10 Essential (primary) hypertension: Secondary | ICD-10-CM | POA: Diagnosis not present

## 2019-10-26 DIAGNOSIS — F32A Depression, unspecified: Secondary | ICD-10-CM

## 2019-10-26 DIAGNOSIS — F329 Major depressive disorder, single episode, unspecified: Secondary | ICD-10-CM

## 2019-10-26 MED ORDER — SERTRALINE HCL 50 MG PO TABS: 50.0000 mg | ORAL_TABLET | Freq: Every day | ORAL | 3 refills | Status: DC

## 2019-10-26 NOTE — Patient Instructions (Signed)
Ok to increase the zoloft to 50 mg per day  Please continue all other medications as before, and refills have been done if requested.  Please have the pharmacy call with any other refills you may need.  Please continue your efforts at being more active, low cholesterol diet, and weight control.  Please keep your appointments with your specialists as you may have planned  Please make an Appointment to return in 6 months, or sooner if needed

## 2019-10-26 NOTE — Progress Notes (Signed)
Subjective:    Patient ID: Jason Hogan, male    DOB: 08/18/1975, 44 y.o.   MRN: [id number]  HPI  Here to f/u; overall doing ok,  Pt denies chest pain, increasing sob or doe, wheezing, orthopnea, PND, increased LE swelling, palpitations, dizziness or syncope.  Pt denies new neurological symptoms such as new headache, or facial or extremity weakness or numbness.  Pt denies polydipsia, polyuria, or low sugar episode.  Pt states overall good compliance with meds, mostly trying to follow appropriate diet, with wt overall stable,  but little exercise however. 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  Also now on zetia +repatha in a clinical study - no current statin and no myalgias.  Now on xanax prn using infrequently, as well as zoloft low dose, but needs a slightly higher dosing.   Denies worsening depressive symptoms, suicidal ideation, or panic; has ongoing anxiety, some increased recently.  Past Medical History:  Diagnosis Date  . Acute Myocardial Infarction 08/18/2009   Qualifier: Diagnosis of  By: Kem Parkinson    . Allergic rhinitis, cause unspecified 10/03/2011  . Anxiety 10/03/2011  . Asthma 09/20/2006   Qualifier: Diagnosis of  By: Jonny Ruiz MD, Len Blalock   . 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 Qualifier: Diagnosis of  By: Eden Emms, MD, Harrington Challenger   . CAD (coronary artery disease)    a. large anterior MI 2011 s/p PTCA/DES to 100%. b. occluded LAD with occluded collateralized silent RCA, balloon angioplasty to stented segment of LAD and DES x1 to mid circumflex in 10/2017.  [name] Cholelithiasis 06/22/2007   Qualifier: Diagnosis of  By: Maris Berger   . Chronic systolic heart failure (HCC) 09/18/2009   Qualifier: Diagnosis of  By: Kem Parkinson    . Depressive disorder, not elsewhere classified 06/22/2007   Qualifier: Diagnosis of  By: Maris Berger   .  History of gout   . History of kidney stones   . Hyperlipidemia 81/2011  . Hypertension   . Impaired glucose tolerance 10/05/2012  . Insomnia, unspecified 06/22/2007   Qualifier: Diagnosis of  By: Jonny Ruiz MD, Len Blalock   . Ischemic cardiomyopathy 10/20/2009   Qualifier: Diagnosis of  By: Graciela Husbands, MD, [facility], [place]   . 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 Migraine Headache 09/18/2009   Qualifier: Diagnosis of  By: Kem Parkinson    . Renal calculus   . Renal Insufficiency 09/18/2009   Qualifier: Diagnosis of  By: Kem Parkinson    . Respiratory failure (HCC) 08/2009  . Rhabdomyolysis 09/18/2009   Qualifier: Diagnosis of  By: Kem Parkinson    . VT (ventricular tachycardia) (HCC)    Past Surgical History:  Procedure Laterality Date  . CARDIAC CATHETERIZATION N/A 05/17/2015   Procedure: Left Heart Cath and Coronary Angiography;  Surgeon: Wendall Stade, MD;  Location: [facility] INVASIVE CV LAB;  Service: Cardiovascular;  Laterality: N/A;  . CARDIAC CATHETERIZATION  2012  . CORONARY ANGIOPLASTY WITH STENT PLACEMENT  2011  . CORONARY STENT INTERVENTION N/A 11/14/2017   Procedure: CORONARY STENT INTERVENTION;  Surgeon: Kathleene Hazel, MD;  Location: [facility] INVASIVE CV LAB;  Service: Cardiovascular;  Laterality: N/A;  . ICD IMPLANT  11/2009   "w/pacing function"  . RIGHT/LEFT HEART CATH AND CORONARY ANGIOGRAPHY N/A 11/14/2017   Procedure: RIGHT/LEFT HEART CATH AND CORONARY ANGIOGRAPHY;  Surgeon: Kathleene Hazel, MD;  Location: [facility] INVASIVE CV LAB;  Service:  Cardiovascular;  Laterality: N/A;    reports that he quit smoking about 25 years ago. His smoking use included cigarettes. He has a 0.20 pack-year smoking history. He has never used smokeless tobacco. He reports current alcohol use. He reports that he does not use drugs. family history includes Diabetes in an other family member; Heart attack in his father; Heart disease in an other family member; Hypothyroidism in an other family  member. Allergies  Allergen Reactions  . Levaquin [Levofloxacin] Other (See Comments)    Joint pain  . Ace Inhibitors Cough  . Crestor [Rosuvastatin] Other (See Comments)    "Sweet cravings"  . Lunesta [Eszopiclone] Other (See Comments)    "Bitter taste in mouth"  . Zocor [Simvastatin] Other (See Comments)    Memory problem   Current Outpatient Medications on File Prior to Visit  Medication Sig Dispense Refill  . albuterol (VENTOLIN HFA) 108 (90 Base) MCG/ACT inhaler Inhale 1-2 puffs into the lungs every 6 (six) hours as needed for wheezing or shortness of breath. 18 g 11  . allopurinol (ZYLOPRIM) 100 MG tablet Take 2 tablets (200 mg total) by mouth daily. 30 tablet 1  . ALPRAZolam (XANAX) 0.5 MG tablet Take 1 tablet (0.5 mg total) by mouth daily as needed for anxiety. 30 tablet 5  . anastrozole (ARIMIDEX) 1 MG tablet Take 1 mg by mouth daily.    [name] aspirin EC 81 MG tablet Take 81 mg by mouth daily.    [name] azelastine (ASTELIN) 0.1 % nasal spray Place into both nostrils.    . carvedilol (COREG) 12.5 MG tablet Take 1.5 tablets (18.75 mg total) by mouth 2 (two) times daily. 90 tablet 5  . clopidogrel (PLAVIX) 75 MG tablet Take 1 tablet (75 mg total) by mouth daily. 30 tablet 3  . Coenzyme Q10 (CO Q 10) 100 MG CAPS Take 100 mg by mouth daily.     . colchicine 0.6 MG tablet Take 1 tablet (0.6 mg total) by mouth daily. 30 tablet 0  . Evolocumab (REPATHA SURECLICK) 140 MG/ML SOAJ Inject 1 pen into the skin every 14 (fourteen) days. 2 pen 11  . ezetimibe (ZETIA) 10 MG tablet Take 1 tablet (10 mg total) by mouth daily. 30 tablet 11  . Fluticasone-Salmeterol (WIXELA INHUB) 250-50 MCG/DOSE AEPB Inhale 1 puff into the lungs 2 (two) times daily. 60 each 11  . KLOR-CON M20 20 MEQ tablet TAKE 2 TABLETS BY MOUTH EVERY DAY 180 tablet 3  . metolazone (ZAROXOLYN) 5 MG tablet TAKE 1 TABLET (5 MG TOTAL) BY MOUTH DAILY AS NEEDED (FOR EDEMA). 30 tablet 9  . mexiletine (MEXITIL) 200 MG capsule TAKE 1 CAPSULE BY  MOUTH TWICE A DAY 180 capsule 1  . predniSONE 5 MG TBEC Take 5 mg by mouth at bedtime. 30 tablet 0  . sacubitril-valsartan (ENTRESTO) 97-103 MG Take 1 tablet by mouth 2 (two) times daily. 180 tablet 3  . spironolactone (ALDACTONE) 25 MG tablet Take 1 tablet (25 mg total) by mouth at bedtime. 30 tablet 5  . tiZANidine (ZANAFLEX) 4 MG tablet TAKE ONE TABLET BY MOUTH TWICE A DAY AS NEEDED 60 tablet 2  . torsemide (DEMADEX) 20 MG tablet Take 1 tablet (20 mg total) by mouth daily as needed. 30 tablet 2  . Budesonide 90 MCG/ACT inhaler Inhale 1 puff into the lungs 2 (two) times daily. 3 each 3   No current facility-administered medications on file prior to visit.   Review of Systems All otherwise neg per pt  Objective:   Physical Exam BP 120/80 (BP Location: Left Arm, Patient Position: Sitting, Cuff Size: Large)   Pulse 78   Temp 97.8 F (36.6 C) (Oral)   Ht 6\' 2"  (1.88 m)   Wt 224 lb (101.6 kg)   SpO2 96%   BMI 28.76 kg/m  VS noted,  Constitutional: Pt appears in NAD HENT: Head: NCAT.  Right Ear: External ear normal.  Left Ear: External ear normal.  Eyes: . Pupils are equal, round, and reactive to light. Conjunctivae and EOM are normal Nose: without d/c or deformity Neck: Neck supple. Gross normal ROM Cardiovascular: Normal rate and regular rhythm.   Pulmonary/Chest: Effort normal and breath sounds without rales or wheezing.  Abd:  Soft, NT, ND, + BS, no organomegaly Neurological: Pt is alert. At baseline orientation, motor grossly intact Skin: Skin is warm. No rashes, other new lesions, no LE edema Psychiatric: Pt behavior is normal without agitation  All otherwise neg per pt Lab Results  Component Value Date   WBC 5.1 05/28/2019   HGB 17.1 (H) 05/28/2019   HCT 55.0 (H) 05/28/2019   PLT 207 05/28/2019   GLUCOSE 108 (H) 09/01/2019   CHOL 85 (L) 04/26/2019   TRIG 75 04/26/2019   HDL 31 (L) 04/26/2019   LDLDIRECT 101.0 10/24/2015   LDLCALC 38 04/26/2019   ALT 19  04/26/2019   AST 18 04/26/2019   NA 137 09/01/2019   K 4.0 09/01/2019   CL 104 09/01/2019   CREATININE 1.00 09/01/2019   BUN 8 09/01/2019   CO2 24 09/01/2019   TSH 1.00 04/21/2019   PSA 0.30 04/21/2019   INR 1.1 (H) 08/01/2016   HGBA1C 5.5 04/21/2019      Assessment & Plan:

## 2019-10-29 ENCOUNTER — Encounter: Payer: Self-pay | Admitting: Family Medicine

## 2019-10-31 ENCOUNTER — Encounter: Payer: Self-pay | Admitting: Internal Medicine

## 2019-10-31 NOTE — Assessment & Plan Note (Signed)
stable overall by history and exam, recent data reviewed with pt, and pt to continue medical treatment as before,  to f/u any worsening symptoms or concerns  

## 2019-10-31 NOTE — Assessment & Plan Note (Addendum)
Ok for increased zoloft 50 qd  I spent 31 minutes in preparing to see the patient by review of recent labs, imaging and procedures, obtaining and reviewing separately obtained history, communicating with the patient and family or caregiver, ordering medications, tests or procedures, and documenting clinical information in the EHR including the differential Dx, treatment, and any further evaluation and other management of anxiety, depression, htn, hyperglycemia

## 2019-11-01 NOTE — Telephone Encounter (Signed)
Left patient voicemail regarding appt tomorrow for his son.

## 2019-11-08 ENCOUNTER — Other Ambulatory Visit: Payer: Self-pay

## 2019-11-08 ENCOUNTER — Ambulatory Visit (INDEPENDENT_AMBULATORY_CARE_PROVIDER_SITE_OTHER): Payer: 59 | Admitting: Orthotics

## 2019-11-08 DIAGNOSIS — M722 Plantar fascial fibromatosis: Secondary | ICD-10-CM

## 2019-11-08 DIAGNOSIS — M1A00X0 Idiopathic chronic gout, unspecified site, without tophus (tophi): Secondary | ICD-10-CM

## 2019-11-08 NOTE — Progress Notes (Signed)

## 2019-11-09 ENCOUNTER — Ambulatory Visit (INDEPENDENT_AMBULATORY_CARE_PROVIDER_SITE_OTHER): Payer: 59 | Admitting: *Deleted

## 2019-11-09 DIAGNOSIS — I255 Ischemic cardiomyopathy: Secondary | ICD-10-CM

## 2019-11-09 LAB — CUP PACEART REMOTE DEVICE CHECK
Battery Remaining Longevity: 19 mo
Battery Remaining Percentage: 18 %
Battery Voltage: 2.75 V
Brady Statistic RV Percent Paced: 1 %
Date Time Interrogation Session: 20210921080433
HighPow Impedance: 47 Ohm
HighPow Impedance: 52 Ohm
Implantable Lead Implant Date: 20111010
Implantable Lead Location: 753860
Implantable Lead Model: 7121
Implantable Pulse Generator Implant Date: 20111010
Lead Channel Impedance Value: 1050 Ohm
Lead Channel Pacing Threshold Amplitude: 2 V
Lead Channel Pacing Threshold Pulse Width: 1 ms
Lead Channel Sensing Intrinsic Amplitude: 11.9 mV
Lead Channel Setting Pacing Amplitude: 4 V
Lead Channel Setting Pacing Pulse Width: 1 ms
Lead Channel Setting Sensing Sensitivity: 0.5 mV
Pulse Gen Serial Number: 615318

## 2019-11-10 NOTE — Progress Notes (Signed)
Remote ICD transmission.   

## 2019-11-12 ENCOUNTER — Other Ambulatory Visit: Payer: Self-pay | Admitting: Family Medicine

## 2019-11-25 ENCOUNTER — Encounter (HOSPITAL_COMMUNITY): Payer: Self-pay

## 2019-11-26 ENCOUNTER — Other Ambulatory Visit (HOSPITAL_COMMUNITY): Payer: Self-pay | Admitting: *Deleted

## 2019-11-30 ENCOUNTER — Other Ambulatory Visit: Payer: Self-pay

## 2019-11-30 ENCOUNTER — Ambulatory Visit (INDEPENDENT_AMBULATORY_CARE_PROVIDER_SITE_OTHER): Payer: 59 | Admitting: Family Medicine

## 2019-11-30 ENCOUNTER — Encounter: Payer: Self-pay | Admitting: Family Medicine

## 2019-11-30 VITALS — BP 110/72 | HR 80 | Ht 74.0 in | Wt 222.0 lb

## 2019-11-30 DIAGNOSIS — M545 Low back pain, unspecified: Secondary | ICD-10-CM

## 2019-11-30 DIAGNOSIS — M999 Biomechanical lesion, unspecified: Secondary | ICD-10-CM

## 2019-11-30 NOTE — Progress Notes (Signed)
[name] [address] [place] [address] Phone: [phone] Subjective:   Jason Hogan, am serving as a scribe for Dr. Antoine Primas. This visit occurred during the SARS-CoV-2 public health emergency.  Safety protocols were in place, including screening questions prior to the visit, additional usage of staff PPE, and extensive cleaning of exam room while observing appropriate contact time as indicated for disinfecting solutions.   I'm seeing this patient by the request  of:  Corwin Levins, MD  CC: Back pain follow-up  [id number]   10/19/2019 Limited musculoskeletal ultrasound of patient's heel shows that patient's plantar fascial significantly decreased in size with 0.12 cm now at this time.  Patient does have some hypoechoic changes of the fat pad noted more superficial than previously  Update 11/30/2019 Jason Hogan is a 44 y.o. male coming in with complaint of left heel and back pain. Patient states that his heel pain has improved.  States that it is nearly 100% improved.  Patient has recently ordered himself a Peloton bike just got delivered.  Lower back is tight. Denies any radiating symptoms. Is going to start working out on [employer] bike and hopes that he can gain some core strength to help with his back.  Patient states that he is feeling rather decent overall the       Past Medical History:  Diagnosis Date  . Acute Myocardial Infarction 08/18/2009   Qualifier: Diagnosis of  By: Kem Parkinson    . Allergic rhinitis, cause unspecified 10/03/2011  . Anxiety 10/03/2011  . Asthma 09/20/2006   Qualifier: Diagnosis of  By: Jonny Ruiz MD, Len Blalock   . 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 Qualifier: Diagnosis of  By: Eden Emms, MD, [facility] Challenger   . CAD (coronary artery disease)    a. large anterior MI 2011 s/p PTCA/DES to 100%. b. occluded LAD with occluded collateralized  silent RCA, balloon angioplasty to stented segment of LAD and DES x1 to mid circumflex in 10/2017.  [name] Cholelithiasis 06/22/2007   Qualifier: Diagnosis of  By: Maris Berger   . Chronic systolic heart failure (HCC) 09/18/2009   Qualifier: Diagnosis of  By: Kem Parkinson    . Depressive disorder, not elsewhere classified 06/22/2007   Qualifier: Diagnosis of  By: Maris Berger   . History of gout   . History of kidney stones   . Hyperlipidemia 81/2011  . Hypertension   . Impaired glucose tolerance 10/05/2012  . Insomnia, unspecified 06/22/2007   Qualifier: Diagnosis of  By: Jonny Ruiz MD, Len Blalock   . Ischemic cardiomyopathy 10/20/2009   Qualifier: Diagnosis of  By: Graciela Husbands, MD, [facility], [place]   . 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 Migraine Headache 09/18/2009   Qualifier: Diagnosis of  By: Kem Parkinson    . Renal calculus   . Renal Insufficiency 09/18/2009   Qualifier: Diagnosis of  By: Kem Parkinson    . Respiratory failure (HCC) 08/2009  . Rhabdomyolysis 09/18/2009   Qualifier: Diagnosis of  By: Kem Parkinson    . VT (ventricular tachycardia) (HCC)    Past Surgical History:  Procedure Laterality Date  . CARDIAC CATHETERIZATION N/A 05/17/2015   Procedure: Left Heart Cath and Coronary Angiography;  Surgeon: Wendall Stade, MD;  Location: [facility] INVASIVE CV LAB;  Service: Cardiovascular;  Laterality: N/A;  . CARDIAC CATHETERIZATION  2012  . CORONARY ANGIOPLASTY WITH STENT PLACEMENT  2011  . CORONARY STENT INTERVENTION  N/A 11/14/2017   Procedure: CORONARY STENT INTERVENTION;  Surgeon: Kathleene Hazel, MD;  Location: [facility] INVASIVE CV LAB;  Service: Cardiovascular;  Laterality: N/A;  . ICD IMPLANT  11/2009   "w/pacing function"  . RIGHT/LEFT HEART CATH AND CORONARY ANGIOGRAPHY N/A 11/14/2017   Procedure: RIGHT/LEFT HEART CATH AND CORONARY ANGIOGRAPHY;  Surgeon: Kathleene Hazel, MD;  Location: [facility] INVASIVE CV LAB;  Service: Cardiovascular;  Laterality: N/A;    Social History   Socioeconomic History  . Marital status: Married    Spouse name: Not on file  . Number of children: 0  . Years of education: Not on file  . Highest education level: Not on file  Occupational History  . 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 Smoking status: Former Smoker    Packs/day: 0.10    Years: 2.00    Pack years: 0.20    Types: Cigarettes    Quit date: 02/18/1994    Years since quitting: 25.7  . Smokeless tobacco: Never Used  Vaping Use  . Vaping Use: Never used  Substance and Sexual Activity  . Alcohol use: Yes    Comment: 11/25/2017 "couple beers/month; if that"  . Drug use: Never  . Sexual activity: Yes  Other Topics Concern  . Not on file  Social History Narrative  . Not on file   Social Determinants of Health   Financial Resource Strain:   . Difficulty of Paying Living Expenses: Not on file  Food Insecurity:   . Worried About Programme researcher, broadcasting/film/video in the Last Year: Not on file  . Ran Out of Food in the Last Year: Not on file  Transportation Needs:   . Lack of Transportation (Medical): Not on file  . Lack of Transportation (Non-Medical): Not on file  Physical Activity:   . Days of Exercise per Week: Not on file  . Minutes of Exercise per Session: Not on file  Stress:   . Feeling of Stress : Not on file  Social Connections:   . Frequency of Communication with Friends and Family: Not on file  . Frequency of Social Gatherings with Friends and Family: Not on file  . Attends Religious Services: Not on file  . Active Member of Clubs or Organizations: Not on file  . Attends Banker Meetings: Not on file  . Marital Status: Not on file   Allergies  Allergen Reactions  . Levaquin [Levofloxacin] Other (See Comments)    Joint pain  . Ace Inhibitors Cough  . Crestor [Rosuvastatin] Other (See Comments)    "Sweet cravings"  . Lunesta [Eszopiclone] Other (See Comments)    "Bitter taste in mouth"  . Zocor  [Simvastatin] Other (See Comments)    Memory problem   Family History  Problem Relation Age of Onset  . Heart attack Father   . Heart disease Other   . Diabetes Other   . Hypothyroidism Other     Current Outpatient Medications (Endocrine & Metabolic):  .  predniSONE 5 MG TBEC, Take 5 mg by mouth at bedtime.  Current Outpatient Medications (Cardiovascular):  .  carvedilol (COREG) 12.5 MG tablet, Take 1.5 tablets (18.75 mg total) by mouth 2 (two) times daily. .  Evolocumab (REPATHA SURECLICK) 140 MG/ML SOAJ, Inject 1 pen into the skin every 14 (fourteen) days. [name]  ezetimibe (ZETIA) 10 MG tablet, Take 1 tablet (10 mg total) by mouth daily. .  metolazone (ZAROXOLYN) 5 MG tablet, TAKE 1 TABLET (5 MG  TOTAL) BY MOUTH DAILY AS NEEDED (FOR EDEMA). .  mexiletine (MEXITIL) 200 MG capsule, TAKE 1 CAPSULE BY MOUTH TWICE A DAY .  sacubitril-valsartan (ENTRESTO) 97-103 MG, Take 1 tablet by mouth 2 (two) times daily. [name]  spironolactone (ALDACTONE) 25 MG tablet, Take 1 tablet (25 mg total) by mouth at bedtime. .  torsemide (DEMADEX) 20 MG tablet, Take 1 tablet (20 mg total) by mouth daily as needed.  Current Outpatient Medications (Respiratory):  .  albuterol (VENTOLIN HFA) 108 (90 Base) MCG/ACT inhaler, Inhale 1-2 puffs into the lungs every 6 (six) hours as needed for wheezing or shortness of breath. [name]  azelastine (ASTELIN) 0.1 % nasal spray, Place into both nostrils. .  Fluticasone-Salmeterol (WIXELA INHUB) 250-50 MCG/DOSE AEPB, Inhale 1 puff into the lungs 2 (two) times daily. .  Budesonide 90 MCG/ACT inhaler, Inhale 1 puff into the lungs 2 (two) times daily.  Current Outpatient Medications (Analgesics):  .  allopurinol (ZYLOPRIM) 100 MG tablet, TAKE TWO TABLETS BY MOUTH DAILY .  aspirin EC 81 MG tablet, Take 81 mg by mouth daily. .  colchicine 0.6 MG tablet, Take 1 tablet (0.6 mg total) by mouth daily.  Current Outpatient Medications (Hematological):  .  clopidogrel (PLAVIX) 75 MG tablet, Take 1  tablet (75 mg total) by mouth daily.  Current Outpatient Medications (Other):  [name]  ALPRAZolam (XANAX) 0.5 MG tablet, Take 1 tablet (0.5 mg total) by mouth daily as needed for anxiety. [name]  anastrozole (ARIMIDEX) 1 MG tablet, Take 1 mg by mouth daily. .  Coenzyme Q10 (CO Q 10) 100 MG CAPS, Take 100 mg by mouth daily.  [name]  KLOR-CON M20 20 MEQ tablet, TAKE 2 TABLETS BY MOUTH EVERY DAY .  sertraline (ZOLOFT) 50 MG tablet, Take 1 tablet (50 mg total) by mouth daily. [name]  tiZANidine (ZANAFLEX) 4 MG tablet, TAKE ONE TABLET BY MOUTH TWICE A DAY AS NEEDED   Reviewed prior external information including notes and imaging from  primary care provider As well as notes that were available from care everywhere and other healthcare systems.  Past medical history, social, surgical and family history all reviewed in electronic medical record.  No pertanent information unless stated regarding to the chief complaint.   Review of Systems:  No headache, visual changes, nausea, vomiting, diarrhea, constipation, dizziness, abdominal pain, skin rash, fevers, chills, night sweats, weight loss, swollen lymph nodes, body aches, joint swelling, chest pain, shortness of breath, mood changes. POSITIVE muscle aches  Objective  Blood pressure 110/72, pulse 80, height 6\' 2"  (1.88 m), weight 222 lb (100.7 kg), SpO2 97 %.   General: No apparent distress alert and oriented x3 mood and affect normal, dressed appropriately.  HEENT: Pupils equal, extraocular movements intact  Respiratory: Patient's speak in full sentences and does not appear short of breath  Cardiovascular: No lower extremity edema, non tender, no erythema  Neuro: Cranial nerves II through XII are intact, neurovascularly intact in all extremities with 2+ DTRs and 2+ pulses.  Gait normal with good balance and coordination.  MSK:   Low back exam does have some mild loss of lordosis, some tenderness to palpation in the paraspinal musculature of the lumbar spine right  greater than left.  Patient does have tightness with FABER test on the right.  Negative straight leg test.  Mild pain of the left greater trochanteric area.   OMT findings C7 flexed rotated and side bent T4 extended rotated and side bent left with inhaled rib T8 extended rotated and side  bent right L3 flexed rotated and side bent left Sacrum right on right   Impression and Recommendations:     The above documentation has been reviewed and is accurate and complete Judi Saa, DO   Low back pain Chronic problem with mild exacerbation.  Patient does have Zanaflex, patient could be having more of a flare also of the gout.  We discussed the allopurinol and patient is only taking the 100 mg.  Discussed increasing to 200 mg.  Icing regimen, follow-up again in 4 to 8 weeks.  Patient will start doing more low impact exercises.      Decision today to treat with OMT was based on Physical Exam  After verbal consent patient was treated with HVLA, ME, FPR techniques in cervical, thoracic, rib, lumbar and sacral areas, all areas are chronic   Patient tolerated the procedure well with improvement in symptoms  Patient given exercises, stretches and lifestyle modifications  See medications in patient instructions if given  Patient will follow up in 4-8 weeks

## 2019-11-30 NOTE — Assessment & Plan Note (Signed)
Chronic problem with mild exacerbation.  Patient does have Zanaflex, patient could be having more of a flare also of the gout.  We discussed the allopurinol and patient is only taking the 100 mg.  Discussed increasing to 200 mg.  Icing regimen, follow-up again in 4 to 8 weeks.  Patient will start doing more low impact exercises.

## 2019-11-30 NOTE — Patient Instructions (Signed)
Allopurinol 2pills daily for a while Watch side pain Stretch after Peloton  See me in 5-8 weeks

## 2019-12-01 ENCOUNTER — Ambulatory Visit (HOSPITAL_COMMUNITY)
Admission: RE | Admit: 2019-12-01 | Discharge: 2019-12-01 | Disposition: A | Discharge status: Home / Self Care | Payer: 59 | Source: Ambulatory Visit | Attending: Cardiology | Admitting: Cardiology

## 2019-12-01 ENCOUNTER — Other Ambulatory Visit: Payer: Self-pay

## 2019-12-01 ENCOUNTER — Other Ambulatory Visit: Payer: Self-pay | Admitting: Pharmacist

## 2019-12-01 VITALS — BP 120/80 | HR 65 | Wt 221.1 lb

## 2019-12-01 DIAGNOSIS — I255 Ischemic cardiomyopathy: Secondary | ICD-10-CM | POA: Insufficient documentation

## 2019-12-01 DIAGNOSIS — Z79899 Other long term (current) drug therapy: Secondary | ICD-10-CM | POA: Insufficient documentation

## 2019-12-01 DIAGNOSIS — J45909 Unspecified asthma, uncomplicated: Secondary | ICD-10-CM | POA: Insufficient documentation

## 2019-12-01 DIAGNOSIS — Z955 Presence of coronary angioplasty implant and graft: Secondary | ICD-10-CM | POA: Diagnosis not present

## 2019-12-01 DIAGNOSIS — I2510 Atherosclerotic heart disease of native coronary artery without angina pectoris: Secondary | ICD-10-CM | POA: Diagnosis not present

## 2019-12-01 DIAGNOSIS — Z8249 Family history of ischemic heart disease and other diseases of the circulatory system: Secondary | ICD-10-CM | POA: Insufficient documentation

## 2019-12-01 DIAGNOSIS — Z7952 Long term (current) use of systemic steroids: Secondary | ICD-10-CM | POA: Diagnosis not present

## 2019-12-01 DIAGNOSIS — F419 Anxiety disorder, unspecified: Secondary | ICD-10-CM | POA: Insufficient documentation

## 2019-12-01 DIAGNOSIS — Z7902 Long term (current) use of antithrombotics/antiplatelets: Secondary | ICD-10-CM | POA: Insufficient documentation

## 2019-12-01 DIAGNOSIS — I110 Hypertensive heart disease with heart failure: Secondary | ICD-10-CM | POA: Diagnosis not present

## 2019-12-01 DIAGNOSIS — Z95810 Presence of automatic (implantable) cardiac defibrillator: Secondary | ICD-10-CM | POA: Diagnosis not present

## 2019-12-01 DIAGNOSIS — Z7951 Long term (current) use of inhaled steroids: Secondary | ICD-10-CM | POA: Diagnosis not present

## 2019-12-01 DIAGNOSIS — I252 Old myocardial infarction: Secondary | ICD-10-CM | POA: Insufficient documentation

## 2019-12-01 DIAGNOSIS — D751 Secondary polycythemia: Secondary | ICD-10-CM | POA: Insufficient documentation

## 2019-12-01 DIAGNOSIS — I5022 Chronic systolic (congestive) heart failure: Secondary | ICD-10-CM | POA: Insufficient documentation

## 2019-12-01 DIAGNOSIS — Z7982 Long term (current) use of aspirin: Secondary | ICD-10-CM | POA: Diagnosis not present

## 2019-12-01 DIAGNOSIS — E785 Hyperlipidemia, unspecified: Secondary | ICD-10-CM | POA: Diagnosis not present

## 2019-12-01 LAB — BASIC METABOLIC PANEL
Anion gap: 9 (ref 5–15)
BUN: 14 mg/dL (ref 6–20)
CO2: 25 mmol/L (ref 22–32)
Calcium: 9.5 mg/dL (ref 8.9–10.3)
Chloride: 104 mmol/L (ref 98–111)
Creatinine, Ser: 1.19 mg/dL (ref 0.61–1.24)
GFR, Estimated: >60 mL/min (ref >60)
Glucose, Bld: 89 mg/dL (ref 70–99)
Potassium: 4.5 mmol/L (ref 3.5–5.1)
Sodium: 138 mmol/L (ref 135–145)

## 2019-12-01 MED ORDER — REPATHA SURECLICK 140 MG/ML ~~LOC~~ SOAJ: 1.0000 "pen " | SUBCUTANEOUS | 11 refills | Status: DC

## 2019-12-01 MED ORDER — CARVEDILOL 25 MG PO TABS: 25.0000 mg | ORAL_TABLET | Freq: Two times a day (BID) | ORAL | 6 refills | Status: DC
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
## 2019-12-01 NOTE — Patient Instructions (Signed)
Increase Carvedilol to 25 mg Twice daily   Take Torsemide 10 mg Daily FOR 2 DAYS ONLY, then back to AS NEEDED  Labs done today, your results will be available in MyChart, we will contact you for abnormal readings.  Your physician recommends that you schedule a follow-up appointment in: 3 months  If you have any questions or concerns before your next appointment please send [place] a message through [place] or call our office at [phone].    TO LEAVE A MESSAGE FOR THE NURSE SELECT OPTION 2, PLEASE LEAVE A MESSAGE INCLUDING: . YOUR NAME . DATE OF BIRTH . CALL BACK NUMBER . REASON FOR CALL**this is important as we prioritize the call backs  YOU WILL RECEIVE A CALL BACK THE SAME DAY AS LONG AS YOU CALL BEFORE 4:00 PM  At the [facility], you and your health needs are our priority. As part of our continuing mission to provide you with exceptional heart care, we have created designated Provider Care Teams. These Care Teams include your primary Cardiologist (physician) and Advanced Practice Providers (APPs- Physician Assistants and Nurse Practitioners) who all work together to provide you with the care you need, when you need it.   You may see any of the following providers on your designated Care Team at your next follow up: [name] Dr Arvilla Meres . Dr Marca Ancona . Tonye Becket, NP . Robbie Lis, PA . Karle Plumber, PharmD   Please be sure to bring in all your medications bottles to every appointment.

## 2019-12-09 ENCOUNTER — Other Ambulatory Visit: Payer: 59 | Admitting: Orthotics

## 2019-12-21 ENCOUNTER — Other Ambulatory Visit: Payer: Self-pay

## 2019-12-21 ENCOUNTER — Ambulatory Visit: Payer: 59 | Admitting: Orthotics

## 2019-12-21 DIAGNOSIS — M722 Plantar fascial fibromatosis: Secondary | ICD-10-CM

## 2020-01-03 NOTE — Progress Notes (Signed)
Patient came in today to pick up custom made foot orthotics.  The goals were accomplished and the patient reported no dissatisfaction with said orthotics.  Patient was advised of breakin period and how to report any issues. 

## 2020-01-24 ENCOUNTER — Ambulatory Visit: Payer: 59 | Admitting: Family Medicine

## 2020-01-26 ENCOUNTER — Encounter: Payer: Self-pay | Admitting: Family Medicine

## 2020-01-26 ENCOUNTER — Ambulatory Visit: Payer: Self-pay

## 2020-01-26 ENCOUNTER — Ambulatory Visit (INDEPENDENT_AMBULATORY_CARE_PROVIDER_SITE_OTHER): Payer: 59 | Admitting: Family Medicine

## 2020-01-26 ENCOUNTER — Other Ambulatory Visit: Payer: Self-pay

## 2020-01-26 VITALS — BP 100/64 | HR 75 | Ht 74.0 in | Wt 227.0 lb

## 2020-01-26 DIAGNOSIS — G8929 Other chronic pain: Secondary | ICD-10-CM

## 2020-01-26 DIAGNOSIS — M722 Plantar fascial fibromatosis: Secondary | ICD-10-CM | POA: Diagnosis not present

## 2020-01-26 DIAGNOSIS — M545 Low back pain, unspecified: Secondary | ICD-10-CM | POA: Diagnosis not present

## 2020-01-26 DIAGNOSIS — M79672 Pain in left foot: Secondary | ICD-10-CM

## 2020-01-26 DIAGNOSIS — M999 Biomechanical lesion, unspecified: Secondary | ICD-10-CM | POA: Insufficient documentation

## 2020-01-26 NOTE — Assessment & Plan Note (Signed)
Patient has had some very mild exacerbation.  On ultrasound today though seems that patient actually has more of a heel contusion.  Difficult to assess patient plantar fascia secondary to the amount.  Could be even potentially a small cystic formation noted.  Discussed with patient about icing regimen, continuing to avoid being barefoot.  Discussed other running shoes that might be more beneficial.  Worsening pain will consider potential injection or advanced imaging again.  Follow-up again in 6 weeks

## 2020-01-26 NOTE — Progress Notes (Signed)
[name] [address] [place] [address] Phone: [phone] Subjective:    I'm seeing this patient by the request  of:  Corwin Levins, MD  CC: Foot pain and back pain follow-up  [id number]  Jason Hogan is a 44 y.o. male coming in with complaint of Low back pain   01/30/2020 Chronic problem with mild exacerbation.  Patient does have Zanaflex, patient could be having more of a flare also of the gout.  We discussed the allopurinol and patient is only taking the 100 mg.  Discussed increasing to 200 mg.  Icing regimen, follow-up again in 4 to 8 weeks.  Patient will start doing more low impact exercises.  Update 01/26/2020 Patient states that lower back is doing better still sore from times to time. Left foot is acting up on Friday night he wore a pair of running shoes to work and then was hobbling on it over the weekend and is still tender today.  Patient states that he has made improvement.  Still having more low back pain though.  Has responded well to manipulation previously well.  Nothing that stopping him from activity.      Past Medical History:  Diagnosis Date  . Acute Myocardial Infarction 08/18/2009   Qualifier: Diagnosis of  By: Kem Parkinson    . Allergic rhinitis, cause unspecified 10/03/2011  . Anxiety 10/03/2011  . Asthma 09/20/2006   Qualifier: Diagnosis of  By: Jonny Ruiz MD, Len Blalock   . 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 Qualifier: Diagnosis of  By: Eden Emms, MD, Harrington Challenger   . CAD (coronary artery disease)    a. large anterior MI 2011 s/p PTCA/DES to 100%. b. occluded LAD with occluded collateralized silent RCA, balloon angioplasty to stented segment of LAD and DES x1 to mid circumflex in 10/2017.  [name] Cholelithiasis 06/22/2007   Qualifier: Diagnosis of  By: Maris Berger   . Chronic systolic heart failure (HCC) 09/18/2009   Qualifier: Diagnosis  of  By: Kem Parkinson    . Depressive disorder, not elsewhere classified 06/22/2007   Qualifier: Diagnosis of  By: Maris Berger   . History of gout   . History of kidney stones   . Hyperlipidemia 81/2011  . Hypertension   . Impaired glucose tolerance 10/05/2012  . Insomnia, unspecified 06/22/2007   Qualifier: Diagnosis of  By: Jonny Ruiz MD, Len Blalock   . Ischemic cardiomyopathy 10/20/2009   Qualifier: Diagnosis of  By: Graciela Husbands, MD, [facility], Ty Hilts   . 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 Migraine Headache 09/18/2009   Qualifier: Diagnosis of  By: Kem Parkinson    . Renal calculus   . Renal Insufficiency 09/18/2009   Qualifier: Diagnosis of  By: Kem Parkinson    . Respiratory failure (HCC) 08/2009  . Rhabdomyolysis 09/18/2009   Qualifier: Diagnosis of  By: Kem Parkinson    . VT (ventricular tachycardia) (HCC)    Past Surgical History:  Procedure Laterality Date  . CARDIAC CATHETERIZATION N/A 05/17/2015   Procedure: Left Heart Cath and Coronary Angiography;  Surgeon: Wendall Stade, MD;  Location: [facility] INVASIVE CV LAB;  Service: Cardiovascular;  Laterality: N/A;  . CARDIAC CATHETERIZATION  2012  . CORONARY ANGIOPLASTY WITH STENT PLACEMENT  2011  . CORONARY STENT INTERVENTION N/A 11/14/2017   Procedure: CORONARY STENT INTERVENTION;  Surgeon: Kathleene Hazel, MD;  Location: [facility] INVASIVE CV LAB;  Service: Cardiovascular;  Laterality: N/A;  .  ICD IMPLANT  11/2009   "w/pacing function"  . RIGHT/LEFT HEART CATH AND CORONARY ANGIOGRAPHY N/A 11/14/2017   Procedure: RIGHT/LEFT HEART CATH AND CORONARY ANGIOGRAPHY;  Surgeon: Kathleene Hazel, MD;  Location: [facility] INVASIVE CV LAB;  Service: Cardiovascular;  Laterality: N/A;   Social History   Socioeconomic History  . Marital status: Married    Spouse name: Not on file  . Number of children: 0  . Years of education: Not on file  . Highest education level: Not on file  Occupational History  . 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 Smoking status: Former Smoker    Packs/day: 0.10    Years: 2.00    Pack years: 0.20    Types: Cigarettes    Quit date: 02/18/1994    Years since quitting: 25.9  . Smokeless tobacco: Never Used  Vaping Use  . Vaping Use: Never used  Substance and Sexual Activity  . Alcohol use: Yes    Comment: 11/25/2017 "couple beers/month; if that"  . Drug use: Never  . Sexual activity: Yes  Other Topics Concern  . Not on file  Social History Narrative  . Not on file   Social Determinants of Health   Financial Resource Strain:   . Difficulty of Paying Living Expenses: Not on file  Food Insecurity:   . Worried About Programme researcher, broadcasting/film/video in the Last Year: Not on file  . Ran Out of Food in the Last Year: Not on file  Transportation Needs:   . Lack of Transportation (Medical): Not on file  . Lack of Transportation (Non-Medical): Not on file  Physical Activity:   . Days of Exercise per Week: Not on file  . Minutes of Exercise per Session: Not on file  Stress:   . Feeling of Stress : Not on file  Social Connections:   . Frequency of Communication with Friends and Family: Not on file  . Frequency of Social Gatherings with Friends and Family: Not on file  . Attends Religious Services: Not on file  . Active Member of Clubs or Organizations: Not on file  . Attends Banker Meetings: Not on file  . Marital Status: Not on file   Allergies  Allergen Reactions  . Levaquin [Levofloxacin] Other (See Comments)    Joint pain  . Ace Inhibitors Cough  . Crestor [Rosuvastatin] Other (See Comments)    "Sweet cravings"  . Lunesta [Eszopiclone] Other (See Comments)    "Bitter taste in mouth"  . Zocor [Simvastatin] Other (See Comments)    Memory problem   Family History  Problem Relation Age of Onset  . Heart attack Father   . Heart disease Other   . Diabetes Other   . Hypothyroidism Other     Current Outpatient Medications (Endocrine &  Metabolic):  .  predniSONE 5 MG TBEC, Take 5 mg by mouth at bedtime. (Patient not taking: Reported on 01/26/2020)  Current Outpatient Medications (Cardiovascular):  .  carvedilol (COREG) 25 MG tablet, Take 1 tablet (25 mg total) by mouth 2 (two) times daily. .  Evolocumab (REPATHA SURECLICK) 140 MG/ML SOAJ, Inject 1 pen into the skin every 14 (fourteen) days. .  metolazone (ZAROXOLYN) 5 MG tablet, TAKE 1 TABLET (5 MG TOTAL) BY MOUTH DAILY AS NEEDED (FOR EDEMA). .  mexiletine (MEXITIL) 200 MG capsule, TAKE 1 CAPSULE BY MOUTH TWICE A DAY .  sacubitril-valsartan (ENTRESTO) 97-103 MG, Take 1 tablet by mouth 2 (two) times daily. [name]  spironolactone (ALDACTONE) 25 MG tablet, Take 1 tablet (25 mg total) by mouth at bedtime. .  torsemide (DEMADEX) 20 MG tablet, Take 1 tablet (20 mg total) by mouth daily as needed. .  ezetimibe (ZETIA) 10 MG tablet, Take 1 tablet (10 mg total) by mouth daily.  Current Outpatient Medications (Respiratory):  .  albuterol (VENTOLIN HFA) 108 (90 Base) MCG/ACT inhaler, Inhale 1-2 puffs into the lungs every 6 (six) hours as needed for wheezing or shortness of breath. [name]  azelastine (ASTELIN) 0.1 % nasal spray, Place into both nostrils. .  Budesonide 90 MCG/ACT inhaler, Inhale 1 puff into the lungs 2 (two) times daily. .  Fluticasone-Salmeterol (WIXELA INHUB) 250-50 MCG/DOSE AEPB, Inhale 1 puff into the lungs 2 (two) times daily.  Current Outpatient Medications (Analgesics):  .  allopurinol (ZYLOPRIM) 100 MG tablet, TAKE TWO TABLETS BY MOUTH DAILY .  aspirin EC 81 MG tablet, Take 81 mg by mouth daily. .  colchicine 0.6 MG tablet, Take 1 tablet (0.6 mg total) by mouth daily.  Current Outpatient Medications (Hematological):  .  clopidogrel (PLAVIX) 75 MG tablet, Take 1 tablet (75 mg total) by mouth daily.  Current Outpatient Medications (Other):  [name]  ALPRAZolam (XANAX) 0.5 MG tablet, Take 1 tablet (0.5 mg total) by mouth daily as needed for anxiety. [name]  anastrozole (ARIMIDEX) 1  MG tablet, Take 1 mg by mouth daily. .  Coenzyme Q10 (CO Q 10) 100 MG CAPS, Take 100 mg by mouth daily.  [name]  KLOR-CON M20 20 MEQ tablet, TAKE 2 TABLETS BY MOUTH EVERY DAY .  sertraline (ZOLOFT) 50 MG tablet, Take 1 tablet (50 mg total) by mouth daily. [name]  tiZANidine (ZANAFLEX) 4 MG tablet, TAKE ONE TABLET BY MOUTH TWICE A DAY AS NEEDED   Reviewed prior external information including notes and imaging from  primary care provider As well as notes that were available from care everywhere and other healthcare systems.  Past medical history, social, surgical and family history all reviewed in electronic medical record.  No pertanent information unless stated regarding to the chief complaint.   Review of Systems:  No headache, visual changes, nausea, vomiting, diarrhea, constipation, dizziness, abdominal pain, skin rash, fevers, chills, night sweats, weight loss, swollen lymph nodes, body aches, joint swelling, chest pain, shortness of breath, mood changes. POSITIVE muscle aches  Objective  Blood pressure 100/64, pulse 75, height 6\' 2"  (1.88 m), weight 227 lb (103 kg), SpO2 96 %.   General: No apparent distress alert and oriented x3 mood and affect normal, dressed appropriately.  HEENT: Pupils equal, extraocular movements intact  Respiratory: Patient's speak in full sentences and does not appear short of breath  Cardiovascular: No lower extremity edema, non tender, no erythema  MSK: Patient's left foot still shows pes planus with overpronation of the foot noted.  Patient is tender to palpation over the plantar aspect at the calcaneal  Low back exam does have some tightness noted.  Tightness noted in the right parascapular region as well.  Patient is minorly tight on the right side with lack of 5 degrees of sidebending.  Limited musculoskeletal ultrasound was performed and interpreted by  Limited ultrasound of patient's heel does show there is hypoechoic changes in the insertion of  the plantar fascia.  No significant enlargement of the plantar fascia noted.  Patient does have what appears to be a cyst or potentially just small inflammation in the subcutaneous tissue.  No cortical irregularity noted  Osteopathic findings C5 flexed  rotated and side bent right C6 flexed rotated and side bent left T3 extended rotated and side bent right inhaled third rib T9 extended rotated and side bent left L2 flexed rotated and side bent right Sacrum right on right    Impression and Recommendations:     The above documentation has been reviewed and is accurate and complete Judi Saa, DO

## 2020-01-26 NOTE — Patient Instructions (Addendum)
Good to see you  congrats on the pool Watch the heel before we do anything drastic  hoka or arahi shoes  Shoulders down and back See me again in 6 weeks

## 2020-01-26 NOTE — Assessment & Plan Note (Signed)
Chronic but mild.  Has responded to manipulation.  Attempted it again today.  Patient is going to continue to work on core strength.  Discussed icing regimen and home exercises.  Discussed patient's medications including Zanaflex for this chronic problem with mild exacerbation.

## 2020-01-26 NOTE — Assessment & Plan Note (Signed)

## 2020-02-05 ENCOUNTER — Other Ambulatory Visit (HOSPITAL_COMMUNITY): Payer: Self-pay | Admitting: Cardiology

## 2020-02-09 ENCOUNTER — Ambulatory Visit (INDEPENDENT_AMBULATORY_CARE_PROVIDER_SITE_OTHER): Payer: 59

## 2020-02-09 DIAGNOSIS — I255 Ischemic cardiomyopathy: Secondary | ICD-10-CM

## 2020-02-09 DIAGNOSIS — I5022 Chronic systolic (congestive) heart failure: Secondary | ICD-10-CM

## 2020-02-09 LAB — CUP PACEART REMOTE DEVICE CHECK
Battery Remaining Longevity: 13 mo
Battery Remaining Percentage: 13 %
Battery Voltage: 2.71 V
Brady Statistic RV Percent Paced: 1 %
Date Time Interrogation Session: 20211222073126
HighPow Impedance: 47 Ohm
Implantable Lead Implant Date: 20111010
Implantable Lead Location: 753860
Implantable Lead Model: 7121
Implantable Pulse Generator Implant Date: 20111010
Lead Channel Impedance Value: 1050 Ohm
Lead Channel Pacing Threshold Amplitude: 2 V
Lead Channel Pacing Threshold Pulse Width: 1 ms
Lead Channel Sensing Intrinsic Amplitude: 11.9 mV
Lead Channel Setting Pacing Amplitude: 4 V
Lead Channel Setting Pacing Pulse Width: 1 ms
Lead Channel Setting Sensing Sensitivity: 0.5 mV
Pulse Gen Serial Number: 615318

## 2020-02-17 ENCOUNTER — Other Ambulatory Visit: Payer: Self-pay | Admitting: Internal Medicine

## 2020-02-17 ENCOUNTER — Other Ambulatory Visit: Payer: Self-pay | Admitting: Cardiovascular Disease

## 2020-02-17 ENCOUNTER — Other Ambulatory Visit: Payer: Self-pay | Admitting: Family Medicine

## 2020-02-17 NOTE — Telephone Encounter (Signed)
Done erx 

## 2020-02-23 NOTE — Progress Notes (Signed)
Remote ICD transmission.   

## 2020-02-24 ENCOUNTER — Encounter: Payer: Self-pay | Admitting: Internal Medicine

## 2020-02-24 ENCOUNTER — Other Ambulatory Visit: Payer: Self-pay

## 2020-02-24 ENCOUNTER — Telehealth (INDEPENDENT_AMBULATORY_CARE_PROVIDER_SITE_OTHER): Payer: 59 | Admitting: Internal Medicine

## 2020-02-24 DIAGNOSIS — J4521 Mild intermittent asthma with (acute) exacerbation: Secondary | ICD-10-CM

## 2020-02-24 DIAGNOSIS — R7302 Impaired glucose tolerance (oral): Secondary | ICD-10-CM | POA: Diagnosis not present

## 2020-02-24 DIAGNOSIS — J22 Unspecified acute lower respiratory infection: Secondary | ICD-10-CM

## 2020-02-24 MED ORDER — ALBUTEROL SULFATE HFA 108 (90 BASE) MCG/ACT IN AERS: 1.0000 | INHALATION_SPRAY | Freq: Four times a day (QID) | RESPIRATORY_TRACT | 11 refills | Status: DC | PRN

## 2020-02-24 MED ORDER — DOXYCYCLINE HYCLATE 100 MG PO TABS: 100.0000 mg | ORAL_TABLET | Freq: Two times a day (BID) | ORAL | 0 refills | Status: DC

## 2020-02-24 MED ORDER — FLUTICASONE-SALMETEROL 250-50 MCG/DOSE IN AEPB: 1.0000 | INHALATION_SPRAY | Freq: Two times a day (BID) | RESPIRATORY_TRACT | 11 refills | Status: DC

## 2020-02-24 MED ORDER — PREDNISONE 10 MG PO TABS: ORAL_TABLET | ORAL | 0 refills | Status: DC

## 2020-02-24 NOTE — Progress Notes (Signed)
Patient ID: Jason Hogan, male   DOB: 06-02-1975, 45 y.o.   MRN: [id number]  Virtual Visit via Video Note  I connected with Jason Hogan on 02/24/20 at  8:00 AM EST by a video enabled telemedicine application and verified that I am speaking with the correct person using two identifiers.  Location of all participants today Patient: at home Provider: at office   I discussed the limitations of evaluation and management by telemedicine and the availability of in person appointments. The patient expressed understanding and agreed to proceed.  History of Present Illness: Here with c/o 4 days onset ST and drianage, nasal congestion, temp 97.2, 95% sat, HR 86 by home measure; Had 1/3 covid testing neg.  Jan 5 started right maxillary burning and pain, somewhat better with dayquil, then yesterday with worsening somewhat prod cough, chest congestion and wheezing with mild sob.  Currenetly not using wixhela.  Pt denies chest pain orthopnea, PND, increased LE swelling, palpitations, dizziness or syncope.   Pt denies polydipsia, polyuria, Past Medical History:  Diagnosis Date  . Acute Myocardial Infarction 08/18/2009   Qualifier: Diagnosis of  By: Kem Parkinson    . Allergic rhinitis, cause unspecified 10/03/2011  . Anxiety 10/03/2011  . Asthma 09/20/2006   Qualifier: Diagnosis of  By: Jonny Ruiz MD, Jason Hogan   . 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 Qualifier: Diagnosis of  By: Eden Emms, MD, Harrington Challenger   . CAD (coronary artery disease)    a. large anterior MI 2011 s/p PTCA/DES to 100%. b. occluded LAD with occluded collateralized silent RCA, balloon angioplasty to stented segment of LAD and DES x1 to mid circumflex in 10/2017.  [name] Cholelithiasis 06/22/2007   Qualifier: Diagnosis of  By: Maris Berger   . Chronic systolic heart failure (HCC) 09/18/2009   Qualifier: Diagnosis of  By: Kem Parkinson    . Depressive disorder,  not elsewhere classified 06/22/2007   Qualifier: Diagnosis of  By: Maris Berger   . History of gout   . History of kidney stones   . Hyperlipidemia 81/2011  . Hypertension   . Impaired glucose tolerance 10/05/2012  . Insomnia, unspecified 06/22/2007   Qualifier: Diagnosis of  By: Jonny Ruiz MD, Jason Hogan   . Ischemic cardiomyopathy 10/20/2009   Qualifier: Diagnosis of  By: Graciela Husbands, MD, [facility], Ty Hilts   . 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 Migraine Headache 09/18/2009   Qualifier: Diagnosis of  By: Kem Parkinson    . Renal calculus   . Renal Insufficiency 09/18/2009   Qualifier: Diagnosis of  By: Kem Parkinson    . Respiratory failure (HCC) 08/2009  . Rhabdomyolysis 09/18/2009   Qualifier: Diagnosis of  By: Kem Parkinson    . VT (ventricular tachycardia) (HCC)    Past Surgical History:  Procedure Laterality Date  . CARDIAC CATHETERIZATION N/A 05/17/2015   Procedure: Left Heart Cath and Coronary Angiography;  Surgeon: Wendall Stade, MD;  Location: [facility] INVASIVE CV LAB;  Service: Cardiovascular;  Laterality: N/A;  . CARDIAC CATHETERIZATION  2012  . CORONARY ANGIOPLASTY WITH STENT PLACEMENT  2011  . CORONARY STENT INTERVENTION N/A 11/14/2017   Procedure: CORONARY STENT INTERVENTION;  Surgeon: Kathleene Hazel, MD;  Location: [facility] INVASIVE CV LAB;  Service: Cardiovascular;  Laterality: N/A;  . ICD IMPLANT  11/2009   "w/pacing function"  . RIGHT/LEFT HEART CATH AND CORONARY ANGIOGRAPHY N/A 11/14/2017   Procedure: RIGHT/LEFT HEART CATH AND CORONARY ANGIOGRAPHY;  Surgeon: Verne Carrow  D, MD;  Location: [facility] INVASIVE CV LAB;  Service: Cardiovascular;  Laterality: N/A;    reports that he quit smoking about 26 years ago. His smoking use included cigarettes. He has a 0.20 pack-year smoking history. He has never used smokeless tobacco. He reports current alcohol use. He reports that he does not use drugs. family history includes Diabetes in an other family member; Heart  attack in his father; Heart disease in an other family member; Hypothyroidism in an other family member. Allergies  Allergen Reactions  . Levaquin [Levofloxacin] Other (See Comments)    Joint pain  . Ace Inhibitors Cough  . Crestor [Rosuvastatin] Other (See Comments)    "Sweet cravings"  . Lunesta [Eszopiclone] Other (See Comments)    "Bitter taste in mouth"  . Zocor [Simvastatin] Other (See Comments)    Memory problem   Current Outpatient Medications on File Prior to Visit  Medication Sig Dispense Refill  . allopurinol (ZYLOPRIM) 100 MG tablet TAKE TWO TABLETS BY MOUTH DAILY 180 tablet 0  . ALPRAZolam (XANAX) 0.5 MG tablet TAKE ONE TABLET BY MOUTH DAILY AS NEEDED FOR ANXIETY 30 tablet 5  . anastrozole (ARIMIDEX) 1 MG tablet Take 1 mg by mouth daily.    [name] aspirin EC 81 MG tablet Take 81 mg by mouth daily.    [name] azelastine (ASTELIN) 0.1 % nasal spray Place into both nostrils.    . Budesonide 90 MCG/ACT inhaler Inhale 1 puff into the lungs 2 (two) times daily. 3 each 3  . carvedilol (COREG) 25 MG tablet Take 1 tablet (25 mg total) by mouth 2 (two) times daily. 60 tablet 6  . clopidogrel (PLAVIX) 75 MG tablet TAKE ONE TABLET BY MOUTH DAILY 30 tablet 3  . Coenzyme Q10 (CO Q 10) 100 MG CAPS Take 100 mg by mouth daily.     . colchicine 0.6 MG tablet Take 1 tablet (0.6 mg total) by mouth daily. 30 tablet 0  . Evolocumab (REPATHA SURECLICK) 140 MG/ML SOAJ Inject 1 pen into the skin every 14 (fourteen) days. 2 mL 11  . ezetimibe (ZETIA) 10 MG tablet TAKE ONE TABLET BY MOUTH DAILY 30 tablet 0  . KLOR-CON M20 20 MEQ tablet TAKE 2 TABLETS BY MOUTH EVERY DAY 180 tablet 3  . metolazone (ZAROXOLYN) 5 MG tablet TAKE 1 TABLET (5 MG TOTAL) BY MOUTH DAILY AS NEEDED (FOR EDEMA). 30 tablet 9  . mexiletine (MEXITIL) 200 MG capsule TAKE 1 CAPSULE BY MOUTH TWICE A DAY 180 capsule 1  . predniSONE 5 MG TBEC Take 5 mg by mouth at bedtime. (Patient not taking: Reported on 01/26/2020) 30 tablet 0  .  sacubitril-valsartan (ENTRESTO) 97-103 MG Take 1 tablet by mouth 2 (two) times daily. 180 tablet 3  . sertraline (ZOLOFT) 50 MG tablet Take 1 tablet (50 mg total) by mouth daily. 90 tablet 3  . spironolactone (ALDACTONE) 25 MG tablet Take 1 tablet (25 mg total) by mouth at bedtime. 30 tablet 5  . tiZANidine (ZANAFLEX) 4 MG tablet TAKE ONE TABLET BY MOUTH TWICE A DAY AS NEEDED 60 tablet 2  . torsemide (DEMADEX) 20 MG tablet Take 1 tablet (20 mg total) by mouth daily as needed. 30 tablet 2   No current facility-administered medications on file prior to visit.    Observations/Objective: Alert, NAD, appropriate mood and affect, resps normal, cn 2-12 intact, moves all 4s, no visible rash or swelling Lab Results  Component Value Date   WBC 5.1 05/28/2019   HGB 17.1 (H) 05/28/2019  HCT 55.0 (H) 05/28/2019   PLT 207 05/28/2019   GLUCOSE 89 12/01/2019   CHOL 85 (L) 04/26/2019   TRIG 75 04/26/2019   HDL 31 (L) 04/26/2019   LDLDIRECT 101.0 10/24/2015   LDLCALC 38 04/26/2019   ALT 19 04/26/2019   AST 18 04/26/2019   NA 138 12/01/2019   K 4.5 12/01/2019   CL 104 12/01/2019   CREATININE 1.19 12/01/2019   BUN 14 12/01/2019   CO2 25 12/01/2019   TSH 1.00 04/21/2019   PSA 0.30 04/21/2019   INR 1.1 (H) 08/01/2016   HGBA1C 5.5 04/21/2019   Assessment and Plan: See notes  Follow Up Instructions: See notes   I discussed the assessment and treatment plan with the patient. The patient was provided an opportunity to ask questions and all were answered. The patient agreed with the plan and demonstrated an understanding of the instructions.   The patient was advised to call back or seek an in-person evaluation if the symptoms worsen or if the condition fails to improve as anticipated.  Cathlean Cower, MD

## 2020-02-24 NOTE — Patient Instructions (Signed)
Please take all new medication as prescribed 

## 2020-03-02 ENCOUNTER — Encounter (HOSPITAL_COMMUNITY): Payer: 59 | Admitting: Cardiology

## 2020-03-06 ENCOUNTER — Encounter: Payer: Self-pay | Admitting: Internal Medicine

## 2020-03-06 NOTE — Assessment & Plan Note (Signed)
Mild to mod, for antibx course,  to f/u any worsening symptoms or concerns, also mucinex bid prn

## 2020-03-06 NOTE — Assessment & Plan Note (Signed)
With mild exacerbation, for predpac, albuterol prn, also start using wixhela on scheduled basis

## 2020-03-06 NOTE — Assessment & Plan Note (Signed)
Lab Results  Component Value Date   HGBA1C 5.5 04/21/2019   Stable, pt to continue current medical treatment  - diet  Current Outpatient Medications (Endocrine & Metabolic):  .  predniSONE (DELTASONE) 10 MG tablet, 3 tabs by mouth per day for 3 days,2tabs per day for 3 days,1tab per day for 3 days .  predniSONE 5 MG TBEC, Take 5 mg by mouth at bedtime. (Patient not taking: Reported on 01/26/2020)  Current Outpatient Medications (Cardiovascular):  .  carvedilol (COREG) 25 MG tablet, Take 1 tablet (25 mg total) by mouth 2 (two) times daily. .  Evolocumab (REPATHA SURECLICK) 140 MG/ML SOAJ, Inject 1 pen into the skin every 14 (fourteen) days. [name]  ezetimibe (ZETIA) 10 MG tablet, TAKE ONE TABLET BY MOUTH DAILY .  metolazone (ZAROXOLYN) 5 MG tablet, TAKE 1 TABLET (5 MG TOTAL) BY MOUTH DAILY AS NEEDED (FOR EDEMA). .  mexiletine (MEXITIL) 200 MG capsule, TAKE 1 CAPSULE BY MOUTH TWICE A DAY .  sacubitril-valsartan (ENTRESTO) 97-103 MG, Take 1 tablet by mouth 2 (two) times daily. [name]  spironolactone (ALDACTONE) 25 MG tablet, Take 1 tablet (25 mg total) by mouth at bedtime. .  torsemide (DEMADEX) 20 MG tablet, Take 1 tablet (20 mg total) by mouth daily as needed.  Current Outpatient Medications (Respiratory):  .  albuterol (VENTOLIN HFA) 108 (90 Base) MCG/ACT inhaler, Inhale 1-2 puffs into the lungs every 6 (six) hours as needed for wheezing or shortness of breath. [name]  azelastine (ASTELIN) 0.1 % nasal spray, Place into both nostrils. .  Budesonide 90 MCG/ACT inhaler, Inhale 1 puff into the lungs 2 (two) times daily. .  Fluticasone-Salmeterol (WIXELA INHUB) 250-50 MCG/DOSE AEPB, Inhale 1 puff into the lungs 2 (two) times daily.  Current Outpatient Medications (Analgesics):  .  allopurinol (ZYLOPRIM) 100 MG tablet, TAKE TWO TABLETS BY MOUTH DAILY .  aspirin EC 81 MG tablet, Take 81 mg by mouth daily. .  colchicine 0.6 MG tablet, Take 1 tablet (0.6 mg total) by mouth daily.  Current Outpatient  Medications (Hematological):  .  clopidogrel (PLAVIX) 75 MG tablet, TAKE ONE TABLET BY MOUTH DAILY  Current Outpatient Medications (Other):  .  doxycycline (VIBRA-TABS) 100 MG tablet, Take 1 tablet (100 mg total) by mouth 2 (two) times daily. [name]  ALPRAZolam (XANAX) 0.5 MG tablet, TAKE ONE TABLET BY MOUTH DAILY AS NEEDED FOR ANXIETY .  anastrozole (ARIMIDEX) 1 MG tablet, Take 1 mg by mouth daily. .  Coenzyme Q10 (CO Q 10) 100 MG CAPS, Take 100 mg by mouth daily.  [name]  KLOR-CON M20 20 MEQ tablet, TAKE 2 TABLETS BY MOUTH EVERY DAY .  sertraline (ZOLOFT) 50 MG tablet, Take 1 tablet (50 mg total) by mouth daily. [name]  tiZANidine (ZANAFLEX) 4 MG tablet, TAKE ONE TABLET BY MOUTH TWICE A DAY AS NEEDED

## 2020-03-07 NOTE — Progress Notes (Deleted)
[name] [address] [place] [address] Phone: [phone] Subjective:    I'm seeing this patient by the request  of:  Corwin Levins, MD  CC:   [id number]  Jason Hogan is a 45 y.o. male coming in with complaint of back and neck pain. OMT 01/26/2020. Left heel pain f/u as well. Patient states   Medications patient has been prescribed:   Taking:         Reviewed prior external information including notes and imaging from previsou exam, outside providers and external EMR if available.   As well as notes that were available from care everywhere and other healthcare systems.  Past medical history, social, surgical and family history all reviewed in electronic medical record.  No pertanent information unless stated regarding to the chief complaint.   Past Medical History:  Diagnosis Date  . Acute Myocardial Infarction 08/18/2009   Qualifier: Diagnosis of  By: Kem Parkinson    . Allergic rhinitis, cause unspecified 10/03/2011  . Anxiety 10/03/2011  . Asthma 09/20/2006   Qualifier: Diagnosis of  By: Jonny Ruiz MD, Len Blalock   . 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 Qualifier: Diagnosis of  By: Eden Emms, MD, Harrington Challenger   . CAD (coronary artery disease)    a. large anterior MI 2011 s/p PTCA/DES to 100%. b. occluded LAD with occluded collateralized silent RCA, balloon angioplasty to stented segment of LAD and DES x1 to mid circumflex in 10/2017.  [name] Cholelithiasis 06/22/2007   Qualifier: Diagnosis of  By: Maris Berger   . Chronic systolic heart failure (HCC) 09/18/2009   Qualifier: Diagnosis of  By: Kem Parkinson    . Depressive disorder, not elsewhere classified 06/22/2007   Qualifier: Diagnosis of  By: Maris Berger   . History of gout   . History of kidney stones   . Hyperlipidemia 81/2011  . Hypertension   . Impaired glucose tolerance  10/05/2012  . Insomnia, unspecified 06/22/2007   Qualifier: Diagnosis of  By: Jonny Ruiz MD, Len Blalock   . Ischemic cardiomyopathy 10/20/2009   Qualifier: Diagnosis of  By: Graciela Husbands, MD, [facility], Ty Hilts   . 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 Migraine Headache 09/18/2009   Qualifier: Diagnosis of  By: Kem Parkinson    . Renal calculus   . Renal Insufficiency 09/18/2009   Qualifier: Diagnosis of  By: Kem Parkinson    . Respiratory failure (HCC) 08/2009  . Rhabdomyolysis 09/18/2009   Qualifier: Diagnosis of  By: Kem Parkinson    . VT (ventricular tachycardia) (HCC)     Allergies  Allergen Reactions  . Levaquin [Levofloxacin] Other (See Comments)    Joint pain  . Ace Inhibitors Cough  . Crestor [Rosuvastatin] Other (See Comments)    "Sweet cravings"  . Lunesta [Eszopiclone] Other (See Comments)    "Bitter taste in mouth"  . Zocor [Simvastatin] Other (See Comments)    Memory problem     Review of Systems:  No headache, visual changes, nausea, vomiting, diarrhea, constipation, dizziness, abdominal pain, skin rash, fevers, chills, night sweats, weight loss, swollen lymph nodes, body aches, joint swelling, chest pain, shortness of breath, mood changes. POSITIVE muscle aches  Objective  There were no vitals taken for this visit.   General: No apparent distress alert and oriented x3 mood and affect normal, dressed appropriately.  HEENT: Pupils equal, extraocular movements intact  Respiratory: Patient's speak in full sentences and does not appear  short of breath  Cardiovascular: No lower extremity edema, non tender, no erythema  Neuro: Cranial nerves II through XII are intact, neurovascularly intact in all extremities with 2+ DTRs and 2+ pulses.  Gait normal with good balance and coordination.  MSK:  Non tender with full range of motion and good stability and symmetric strength and tone of shoulders, elbows, wrist, hip, knee and ankles bilaterally.  Back - Normal skin, Spine with normal  alignment and no deformity.  No tenderness to vertebral process palpation.  Paraspinous muscles are not tender and without spasm.   Range of motion is full at neck and lumbar sacral regions  Osteopathic findings  C2 flexed rotated and side bent right C6 flexed rotated and side bent left T3 extended rotated and side bent right inhaled rib T9 extended rotated and side bent left L2 flexed rotated and side bent right Sacrum right on right       Assessment and Plan:    Nonallopathic problems  Decision today to treat with OMT was based on Physical Exam  After verbal consent patient was treated with HVLA, ME, FPR techniques in cervical, rib, thoracic, lumbar, and sacral  areas  Patient tolerated the procedure well with improvement in symptoms  Patient given exercises, stretches and lifestyle modifications  See medications in patient instructions if given  Patient will follow up in 4-8 weeks      The above documentation has been reviewed and is accurate and complete Wilford Grist       Note: This dictation was prepared with Dragon dictation along with smaller phrase technology. Any transcriptional errors that result from this process are unintentional.

## 2020-03-08 ENCOUNTER — Ambulatory Visit: Payer: 59 | Admitting: Family Medicine

## 2020-03-26 ENCOUNTER — Other Ambulatory Visit: Payer: Self-pay | Admitting: Cardiovascular Disease

## 2020-04-06 ENCOUNTER — Other Ambulatory Visit: Payer: Self-pay | Admitting: Cardiovascular Disease

## 2020-04-25 ENCOUNTER — Ambulatory Visit: Payer: 59 | Admitting: Internal Medicine

## 2020-04-27 ENCOUNTER — Other Ambulatory Visit (HOSPITAL_COMMUNITY): Payer: Self-pay

## 2020-04-27 MED ORDER — CARVEDILOL 25 MG PO TABS: 25.0000 mg | ORAL_TABLET | Freq: Two times a day (BID) | ORAL | 3 refills | Status: DC

## 2020-05-01 ENCOUNTER — Other Ambulatory Visit: Payer: Self-pay

## 2020-05-02 ENCOUNTER — Encounter: Payer: Self-pay | Admitting: Internal Medicine

## 2020-05-02 ENCOUNTER — Ambulatory Visit (INDEPENDENT_AMBULATORY_CARE_PROVIDER_SITE_OTHER): Payer: 59 | Admitting: Internal Medicine

## 2020-05-02 ENCOUNTER — Other Ambulatory Visit: Payer: Self-pay

## 2020-05-02 VITALS — BP 110/78 | HR 81 | Temp 98.2°F | Ht 74.0 in | Wt 226.0 lb

## 2020-05-02 DIAGNOSIS — Z0001 Encounter for general adult medical examination with abnormal findings: Secondary | ICD-10-CM

## 2020-05-02 DIAGNOSIS — E559 Vitamin D deficiency, unspecified: Secondary | ICD-10-CM | POA: Insufficient documentation

## 2020-05-02 DIAGNOSIS — D751 Secondary polycythemia: Secondary | ICD-10-CM | POA: Diagnosis not present

## 2020-05-02 DIAGNOSIS — I10 Essential (primary) hypertension: Secondary | ICD-10-CM

## 2020-05-02 DIAGNOSIS — Z125 Encounter for screening for malignant neoplasm of prostate: Secondary | ICD-10-CM | POA: Diagnosis not present

## 2020-05-02 DIAGNOSIS — Z0000 Encounter for general adult medical examination without abnormal findings: Secondary | ICD-10-CM | POA: Diagnosis not present

## 2020-05-02 DIAGNOSIS — R7302 Impaired glucose tolerance (oral): Secondary | ICD-10-CM | POA: Diagnosis not present

## 2020-05-02 DIAGNOSIS — E538 Deficiency of other specified B group vitamins: Secondary | ICD-10-CM | POA: Diagnosis not present

## 2020-05-02 DIAGNOSIS — E291 Testicular hypofunction: Secondary | ICD-10-CM

## 2020-05-02 LAB — URINALYSIS, ROUTINE W REFLEX MICROSCOPIC
Bilirubin Urine: NEGATIVE
Hgb urine dipstick: NEGATIVE
Ketones, ur: NEGATIVE
Leukocytes,Ua: NEGATIVE
Nitrite: NEGATIVE
RBC / HPF: NONE SEEN (ref 0–?)
Specific Gravity, Urine: 1.025 (ref 1.000–1.030)
Total Protein, Urine: NEGATIVE
Urine Glucose: NEGATIVE
Urobilinogen, UA: 0.2 (ref 0.0–1.0)
WBC, UA: NONE SEEN (ref 0–?)
pH: 6 (ref 5.0–8.0)

## 2020-05-02 LAB — HEPATIC FUNCTION PANEL
ALT: 24 U/L (ref 0–53)
AST: 20 U/L (ref 0–37)
Albumin: 4.2 g/dL (ref 3.5–5.2)
Alkaline Phosphatase: 54 U/L (ref 39–117)
Bilirubin, Direct: 0.1 mg/dL (ref 0.0–0.3)
Total Bilirubin: 0.6 mg/dL (ref 0.2–1.2)
Total Protein: 7.3 g/dL (ref 6.0–8.3)

## 2020-05-02 LAB — PSA: PSA: 0.45 ng/mL (ref 0.10–4.00)

## 2020-05-02 LAB — BASIC METABOLIC PANEL
BUN: 14 mg/dL (ref 6–23)
CO2: 29 mEq/L (ref 19–32)
Calcium: 9.7 mg/dL (ref 8.4–10.5)
Chloride: 101 mEq/L (ref 96–112)
Creatinine, Ser: 1.18 mg/dL (ref 0.40–1.50)
GFR: 75.2 mL/min (ref >60.00)
Glucose, Bld: 81 mg/dL (ref 70–99)
Potassium: 4.4 mEq/L (ref 3.5–5.1)
Sodium: 136 mEq/L (ref 135–145)

## 2020-05-02 LAB — CBC WITH DIFFERENTIAL/PLATELET
Basophils Absolute: 0.1 10*3/uL (ref 0.0–0.1)
Basophils Relative: 0.7 % (ref 0.0–3.0)
Eosinophils Absolute: 0.2 10*3/uL (ref 0.0–0.7)
Eosinophils Relative: 2.3 % (ref 0.0–5.0)
HCT: 51.2 % (ref 39.0–52.0)
Hemoglobin: 16 g/dL (ref 13.0–17.0)
Lymphocytes Relative: 22.2 % (ref 12.0–46.0)
Lymphs Abs: 1.7 10*3/uL (ref 0.7–4.0)
MCHC: 31.3 g/dL (ref 30.0–36.0)
MCV: 80.5 fl (ref 78.0–100.0)
Monocytes Absolute: 0.8 10*3/uL (ref 0.1–1.0)
Monocytes Relative: 10 % (ref 3.0–12.0)
Neutro Abs: 5 10*3/uL (ref 1.4–7.7)
Neutrophils Relative %: 64.8 % (ref 43.0–77.0)
Platelets: 239 10*3/uL (ref 150.0–400.0)
RBC: 6.36 Mil/uL — ABNORMAL HIGH (ref 4.22–5.81)
RDW: 17 % — ABNORMAL HIGH (ref 11.5–15.5)
WBC: 7.7 10*3/uL (ref 4.0–10.5)

## 2020-05-02 LAB — HEMOGLOBIN A1C: Hgb A1c MFr Bld: 5.7 % (ref 4.6–6.5)

## 2020-05-02 LAB — LIPID PANEL
Cholesterol: 144 mg/dL (ref 0–200)
HDL: 48.3 mg/dL (ref >39.00)
LDL Cholesterol: 70 mg/dL (ref 0–99)
NonHDL: 95.44
Total CHOL/HDL Ratio: 3
Triglycerides: 126 mg/dL (ref 0.0–149.0)
VLDL: 25.2 mg/dL (ref 0.0–40.0)

## 2020-05-02 LAB — TSH: TSH: 1.38 u[IU]/mL (ref 0.35–4.50)

## 2020-05-02 LAB — VITAMIN D 25 HYDROXY (VIT D DEFICIENCY, FRACTURES): VITD: 18.81 ng/mL — ABNORMAL LOW (ref 30.00–100.00)

## 2020-05-02 LAB — TESTOSTERONE: Testosterone: 1358.05 ng/dL — ABNORMAL HIGH (ref 300.00–890.00)

## 2020-05-02 LAB — VITAMIN B12: Vitamin B-12: 355 pg/mL (ref 211–911)

## 2020-05-02 MED ORDER — MUPIROCIN CALCIUM 2 % NA OINT: 1.0000 "application " | TOPICAL_OINTMENT | Freq: Two times a day (BID) | NASAL | 0 refills | Status: DC

## 2020-05-02 MED ORDER — THERA-D 2000 50 MCG (2000 UT) PO TABS: ORAL_TABLET | ORAL | 99 refills | Status: DC

## 2020-05-02 MED ORDER — DOXYCYCLINE HYCLATE 100 MG PO TABS: 100.0000 mg | ORAL_TABLET | Freq: Two times a day (BID) | ORAL | 0 refills | Status: DC

## 2020-05-02 NOTE — Patient Instructions (Signed)
Please take all new medication as prescribed - the two antibiotics  Please take OTC Vitamin D3 at 2000 units per day, indefinitely.  Please continue all other medications as before, and refills have been done if requested.  Please have the pharmacy call with any other refills you may need.  Please continue your efforts at being more active, low cholesterol diet, and weight control.  You are otherwise up to date with prevention measures today.  Please keep your appointments with your specialists as you may have planned  Please go to the LAB at the blood drawing area for the tests to be done  You will be contacted by phone if any changes need to be made immediately.  Otherwise, you will receive a letter about your results with an explanation, but please check with MyChart first.  Please remember to sign up for MyChart if you have not done so, as this will be important to you in the future with finding out test results, communicating by private email, and scheduling acute appointments online when needed.  Please make an Appointment to return for your 1 year visit, or sooner if needed

## 2020-05-02 NOTE — Assessment & Plan Note (Signed)

## 2020-05-02 NOTE — Progress Notes (Signed)
Patient ID: Jason Hogan, male   DOB: 10-17-1975, 45 y.o.   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 male here for wellness exam, already up to date with preventive referrals and immunizations                        Also c/o ongoing spreading pustular rash to forehead, and torso in the past 2 wks, without fever, chills.  Did have recent sinus infection initially improved with doxy, but rapidly worse again requiring a f/u course of augmentin per UC.  Pt denies chest pain, increased sob or doe, wheezing, orthopnea, PND, increased LE swelling, palpitations, dizziness or syncope.   Pt denies polydipsia, polyuria, Denies new focal neuro s/s.  Not taking Vit D.  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  No other new complaints   Wt Readings from Last 3 Encounters:  05/02/20 226 lb (102.5 kg)  01/26/20 227 lb (103 kg)  12/01/19 221 lb 2 oz (100.3 kg)   BP Readings from Last 3 Encounters:  05/02/20 110/78  01/26/20 100/64  12/01/19 120/80   Immunization History  Administered Date(s) Administered  . Influenza,inj,Quad PF,6+ Mos 10/14/2013, 11/17/2014, 10/24/2015, 12/13/2016, 11/15/2017, 10/26/2019  . Influenza-Unspecified 09/18/2012  . Moderna Sars-Covid-2 Vaccination 04/22/2019, 05/24/2019, 12/22/2019  . Pneumococcal Polysaccharide-23 02/18/2009  . Tdap 10/26/2019  There are no preventive care reminders to display for this patient.    Past Medical History:  Diagnosis Date  . Acute Myocardial Infarction 08/18/2009   Qualifier: Diagnosis of  By: Kem Parkinson    . Allergic rhinitis, cause unspecified 10/03/2011  . Anxiety 10/03/2011  . Asthma 09/20/2006   Qualifier: Diagnosis of  By: Jonny Ruiz MD, Len Blalock   . Automatic implantable  cardiac defibrillator in situ 01/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 Qualifier: Diagnosis of  By: Eden Emms, MD, Harrington Challenger   . CAD (coronary artery disease)    a. large anterior MI 2011 s/p PTCA/DES to 100%. b. occluded LAD with occluded collateralized silent RCA, balloon angioplasty to stented segment of LAD and DES x1 to mid circumflex in 10/2017.  [name] Cholelithiasis 06/22/2007   Qualifier: Diagnosis of  By: Maris Berger   . Chronic systolic heart failure (HCC) 09/18/2009   Qualifier: Diagnosis of  By: Kem Parkinson    . Depressive disorder, not elsewhere classified 06/22/2007   Qualifier: Diagnosis of  By: Maris Berger   . History of gout   . History of kidney stones   . Hyperlipidemia 81/2011  . Hypertension   . Impaired glucose tolerance 10/05/2012  . Insomnia, unspecified 06/22/2007   Qualifier: Diagnosis of  By: Jonny Ruiz MD, Len Blalock   . Ischemic cardiomyopathy 10/20/2009   Qualifier: Diagnosis of  By: Graciela Husbands, MD, [facility], Ty Hilts   . 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 Migraine Headache 09/18/2009   Qualifier: Diagnosis of  By: Kem Parkinson    . Renal calculus   . Renal Insufficiency 09/18/2009   Qualifier: Diagnosis of  By: Kem Parkinson    . Respiratory failure (HCC) 08/2009  . Rhabdomyolysis 09/18/2009   Qualifier: Diagnosis of  By: Kem Parkinson    . VT (ventricular tachycardia) (HCC)  Past Surgical History:  Procedure Laterality Date  . CARDIAC CATHETERIZATION N/A 05/17/2015   Procedure: Left Heart Cath and Coronary Angiography;  Surgeon: Wendall Stade, MD;  Location: [facility] INVASIVE CV LAB;  Service: Cardiovascular;  Laterality: N/A;  . CARDIAC CATHETERIZATION  2012  . CORONARY ANGIOPLASTY WITH STENT PLACEMENT  2011  . CORONARY STENT INTERVENTION N/A 11/14/2017   Procedure: CORONARY STENT INTERVENTION;  Surgeon: Kathleene Hazel, MD;  Location: [facility] INVASIVE CV LAB;  Service: Cardiovascular;  Laterality: N/A;  . ICD IMPLANT   11/2009   "w/pacing function"  . RIGHT/LEFT HEART CATH AND CORONARY ANGIOGRAPHY N/A 11/14/2017   Procedure: RIGHT/LEFT HEART CATH AND CORONARY ANGIOGRAPHY;  Surgeon: Kathleene Hazel, MD;  Location: [facility] INVASIVE CV LAB;  Service: Cardiovascular;  Laterality: N/A;    reports that he quit smoking about 26 years ago. His smoking use included cigarettes. He has a 0.20 pack-year smoking history. He has never used smokeless tobacco. He reports current alcohol use. He reports that he does not use drugs. family history includes Diabetes in an other family member; Heart attack in his father; Heart disease in an other family member; Hypothyroidism in an other family member. Allergies  Allergen Reactions  . Levaquin [Levofloxacin] Other (See Comments)    Joint pain  . Ace Inhibitors Cough  . Crestor [Rosuvastatin] Other (See Comments)    "Sweet cravings"  . Lunesta [Eszopiclone] Other (See Comments)    "Bitter taste in mouth"  . Zocor [Simvastatin] Other (See Comments)    Memory problem   Current Outpatient Medications on File Prior to Visit  Medication Sig Dispense Refill  . albuterol (VENTOLIN HFA) 108 (90 Base) MCG/ACT inhaler Inhale 1-2 puffs into the lungs every 6 (six) hours as needed for wheezing or shortness of breath. 18 g 11  . allopurinol (ZYLOPRIM) 100 MG tablet TAKE TWO TABLETS BY MOUTH DAILY 180 tablet 0  . ALPRAZolam (XANAX) 0.5 MG tablet TAKE ONE TABLET BY MOUTH DAILY AS NEEDED FOR ANXIETY 30 tablet 5  . anastrozole (ARIMIDEX) 1 MG tablet Take 1 mg by mouth daily.    [name] aspirin EC 81 MG tablet Take 81 mg by mouth daily.    [name] azelastine (ASTELIN) 0.1 % nasal spray Place into both nostrils.    . Budesonide 90 MCG/ACT inhaler Inhale 1 puff into the lungs 2 (two) times daily. 3 each 3  . carvedilol (COREG) 25 MG tablet Take 1 tablet (25 mg total) by mouth 2 (two) times daily. 60 tablet 3  . clopidogrel (PLAVIX) 75 MG tablet TAKE ONE TABLET BY MOUTH DAILY 30 tablet 3  . Coenzyme  Q10 (CO Q 10) 100 MG CAPS Take 100 mg by mouth daily.     . colchicine 0.6 MG tablet Take 1 tablet (0.6 mg total) by mouth daily. 30 tablet 0  . Evolocumab (REPATHA SURECLICK) 140 MG/ML SOAJ Inject 1 pen into the skin every 14 (fourteen) days. 2 mL 11  . ezetimibe (ZETIA) 10 MG tablet TAKE ONE TABLET BY MOUTH DAILY **PLEASE SCHEDULE APPOINTMENT FOR ADDITIONAL REFILLS** 15 tablet 0  . Fluticasone-Salmeterol (WIXELA INHUB) 250-50 MCG/DOSE AEPB Inhale 1 puff into the lungs 2 (two) times daily. 60 each 11  . KLOR-CON M20 20 MEQ tablet TAKE 2 TABLETS BY MOUTH EVERY DAY 180 tablet 3  . metolazone (ZAROXOLYN) 5 MG tablet TAKE 1 TABLET (5 MG TOTAL) BY MOUTH DAILY AS NEEDED (FOR EDEMA). 30 tablet 9  . mexiletine (MEXITIL) 200 MG capsule TAKE 1 CAPSULE BY MOUTH  TWICE A DAY 180 capsule 1  . sacubitril-valsartan (ENTRESTO) 97-103 MG Take 1 tablet by mouth 2 (two) times daily. 180 tablet 3  . sertraline (ZOLOFT) 50 MG tablet Take 1 tablet (50 mg total) by mouth daily. 90 tablet 3  . spironolactone (ALDACTONE) 25 MG tablet Take 1 tablet (25 mg total) by mouth at bedtime. 30 tablet 5  . tiZANidine (ZANAFLEX) 4 MG tablet TAKE ONE TABLET BY MOUTH TWICE A DAY AS NEEDED 60 tablet 2  . torsemide (DEMADEX) 20 MG tablet Take 1 tablet (20 mg total) by mouth daily as needed. 30 tablet 2  . predniSONE (DELTASONE) 10 MG tablet 3 tabs by mouth per day for 3 days,2tabs per day for 3 days,1tab per day for 3 days (Patient not taking: Reported on 05/02/2020) 18 tablet 0  . predniSONE 5 MG TBEC Take 5 mg by mouth at bedtime. (Patient not taking: No sig reported) 30 tablet 0   No current facility-administered medications on file prior to visit.        ROS:  All others reviewed and negative.  Objective        PE:  BP 110/78   Pulse 81   Temp 98.2 F (36.8 C) (Oral)   Ht 6\' 2"  (1.88 m)   Wt 226 lb (102.5 kg)   SpO2 97%   BMI 29.02 kg/m                 Constitutional: Pt appears in NAD               HENT: Head: NCAT.                 Right Ear: External ear normal.                 Left Ear: External ear normal.                Eyes: . Pupils are equal, round, and reactive to light. Conjunctivae and EOM are normal               Nose: without d/c or deformity               Neck: Neck supple. Gross normal ROM               Cardiovascular: Normal rate and regular rhythm.                 Pulmonary/Chest: Effort normal and breath sounds without rales or wheezing.                Abd:  Soft, NT, ND, + BS, no organomegaly               Neurological: Pt is alert. At baseline orientation, motor grossly intact               Skin: Skin is warm. No rashes, no other new lesions, LE edema - trace bilat               Psychiatric: Pt behavior is normal without agitation   Micro: none  Cardiac tracings I have personally interpreted today:  none  Pertinent Radiological findings (summarize): none   Lab Results  Component Value Date   WBC 7.7 05/02/2020   HGB 16.0 05/02/2020   HCT 51.2 05/02/2020   PLT 239.0 05/02/2020   GLUCOSE 81 05/02/2020   CHOL 144 05/02/2020   TRIG 126.0 05/02/2020   HDL 48.30 05/02/2020   LDLDIRECT 101.0 10/24/2015  LDLCALC 70 05/02/2020   ALT 24 05/02/2020   AST 20 05/02/2020   NA 136 05/02/2020   K 4.4 05/02/2020   CL 101 05/02/2020   CREATININE 1.18 05/02/2020   BUN 14 05/02/2020   CO2 29 05/02/2020   TSH 1.38 05/02/2020   PSA 0.45 05/02/2020   INR 1.1 (H) 08/01/2016   HGBA1C 5.7 05/02/2020   Assessment/Plan:  ANZEL MATHIASON is a 45 y.o. Other or two or more races [6] male with  has a past medical history of Acute Myocardial Infarction (08/18/2009), Allergic rhinitis, cause unspecified (10/03/2011), Anxiety (10/03/2011), Asthma (09/20/2006), Automatic implantable cardiac defibrillator in situ (01/17/2010), CAD (coronary artery disease), Cholelithiasis (06/22/2007), Chronic systolic heart failure (HCC) (08/20/4285), Depressive disorder, not elsewhere classified (06/22/2007), History of  gout, History of kidney stones, Hyperlipidemia (81/2011), Hypertension, Impaired glucose tolerance (10/05/2012), Insomnia, unspecified (06/22/2007), Ischemic cardiomyopathy (10/20/2009), Low testosterone (10/14/2013), Migraine Headache (09/18/2009), Renal calculus, Renal Insufficiency (09/18/2009), Respiratory failure (HCC) (08/2009), Rhabdomyolysis (09/18/2009), and VT (ventricular tachycardia) (HCC).  Encounter for well adult exam with abnormal findings Age and sex appropriate education and counseling updated with regular exercise and diet Referrals for preventative services - none needed Immunizations addressed - none needed Smoking counseling  - none needed Evidence for depression or other mood disorder - none significant Most recent labs reviewed. I have personally reviewed and have noted: 1) the patient's medical and social history 2) The patient's current medications and supplements 3) The patient's height, weight, and BMI have been recorded in the chart   Impaired glucose tolerance Lab Results  Component Value Date   HGBA1C 5.7 05/02/2020   Stable, pt to continue current medical treatment  - diet and wt control   Hypogonadism in male For testosterone lab today, suspect being overtreated and likely needs lower testosterone replacement dose, and then be able to avoid donating blood to avoid polycythemia symptoms  Hypertension BP Readings from Last 3 Encounters:  05/02/20 110/78  01/26/20 100/64  12/01/19 120/80   Stable, pt to continue medical treatment coreg  Current Outpatient Medications (Endocrine & Metabolic):  .  predniSONE (DELTASONE) 10 MG tablet, 3 tabs by mouth per day for 3 days,2tabs per day for 3 days,1tab per day for 3 days (Patient not taking: Reported on 05/02/2020) .  predniSONE 5 MG TBEC, Take 5 mg by mouth at bedtime. (Patient not taking: No sig reported)  Current Outpatient Medications (Cardiovascular):  .  carvedilol (COREG) 25 MG tablet, Take 1 tablet (25  mg total) by mouth 2 (two) times daily. .  Evolocumab (REPATHA SURECLICK) 140 MG/ML SOAJ, Inject 1 pen into the skin every 14 (fourteen) days. [name]  ezetimibe (ZETIA) 10 MG tablet, TAKE ONE TABLET BY MOUTH DAILY **PLEASE SCHEDULE APPOINTMENT FOR ADDITIONAL REFILLS** .  metolazone (ZAROXOLYN) 5 MG tablet, TAKE 1 TABLET (5 MG TOTAL) BY MOUTH DAILY AS NEEDED (FOR EDEMA). .  mexiletine (MEXITIL) 200 MG capsule, TAKE 1 CAPSULE BY MOUTH TWICE A DAY .  sacubitril-valsartan (ENTRESTO) 97-103 MG, Take 1 tablet by mouth 2 (two) times daily. [name]  spironolactone (ALDACTONE) 25 MG tablet, Take 1 tablet (25 mg total) by mouth at bedtime. .  torsemide (DEMADEX) 20 MG tablet, Take 1 tablet (20 mg total) by mouth daily as needed.  Current Outpatient Medications (Respiratory):  .  albuterol (VENTOLIN HFA) 108 (90 Base) MCG/ACT inhaler, Inhale 1-2 puffs into the lungs every 6 (six) hours as needed for wheezing or shortness of breath. [name]  azelastine (ASTELIN) 0.1 % nasal spray, Place into both  nostrils. .  Budesonide 90 MCG/ACT inhaler, Inhale 1 puff into the lungs 2 (two) times daily. .  Fluticasone-Salmeterol (WIXELA INHUB) 250-50 MCG/DOSE AEPB, Inhale 1 puff into the lungs 2 (two) times daily. .  mupirocin nasal ointment (BACTROBAN) 2 %, Place 1 application into the nose 2 (two) times daily. Use one-half of tube in each nostril twice daily for five (5) days. After application, press sides of nose together and gently massage.  Current Outpatient Medications (Analgesics):  .  allopurinol (ZYLOPRIM) 100 MG tablet, TAKE TWO TABLETS BY MOUTH DAILY .  aspirin EC 81 MG tablet, Take 81 mg by mouth daily. .  colchicine 0.6 MG tablet, Take 1 tablet (0.6 mg total) by mouth daily.  Current Outpatient Medications (Hematological):  .  clopidogrel (PLAVIX) 75 MG tablet, TAKE ONE TABLET BY MOUTH DAILY  Current Outpatient Medications (Other):  [name]  ALPRAZolam (XANAX) 0.5 MG tablet, TAKE ONE TABLET BY MOUTH DAILY AS NEEDED FOR  ANXIETY .  anastrozole (ARIMIDEX) 1 MG tablet, Take 1 mg by mouth daily. .  Cholecalciferol (THERA-D 2000) 50 MCG (2000 UT) TABS, 1 tab by mouth once daily .  Coenzyme Q10 (CO Q 10) 100 MG CAPS, Take 100 mg by mouth daily.  [name]  doxycycline (VIBRA-TABS) 100 MG tablet, Take 1 tablet (100 mg total) by mouth 2 (two) times daily. [name]  KLOR-CON M20 20 MEQ tablet, TAKE 2 TABLETS BY MOUTH EVERY DAY .  sertraline (ZOLOFT) 50 MG tablet, Take 1 tablet (50 mg total) by mouth daily. [name]  tiZANidine (ZANAFLEX) 4 MG tablet, TAKE ONE TABLET BY MOUTH TWICE A DAY AS NEEDED   Vitamin D deficiency Last vitamin D Lab Results  Component Value Date   VD25OH 18.81 (L) 05/02/2020   Low, to start oral replacement   Polycythemia For f/u lab today, pt plans to continue blood donation as a type of self treatment  Followup: Return in about 1 year (around 05/02/2021).  Oliver Barre, MD 05/02/2020 9:35 PM [facility]

## 2020-05-02 NOTE — Assessment & Plan Note (Signed)
BP Readings from Last 3 Encounters:  05/02/20 110/78  01/26/20 100/64  12/01/19 120/80   Stable, pt to continue medical treatment coreg  Current Outpatient Medications (Endocrine & Metabolic):  .  predniSONE (DELTASONE) 10 MG tablet, 3 tabs by mouth per day for 3 days,2tabs per day for 3 days,1tab per day for 3 days (Patient not taking: Reported on 05/02/2020) .  predniSONE 5 MG TBEC, Take 5 mg by mouth at bedtime. (Patient not taking: No sig reported)  Current Outpatient Medications (Cardiovascular):  .  carvedilol (COREG) 25 MG tablet, Take 1 tablet (25 mg total) by mouth 2 (two) times daily. .  Evolocumab (REPATHA SURECLICK) 140 MG/ML SOAJ, Inject 1 pen into the skin every 14 (fourteen) days. [name]  ezetimibe (ZETIA) 10 MG tablet, TAKE ONE TABLET BY MOUTH DAILY **PLEASE SCHEDULE APPOINTMENT FOR ADDITIONAL REFILLS** .  metolazone (ZAROXOLYN) 5 MG tablet, TAKE 1 TABLET (5 MG TOTAL) BY MOUTH DAILY AS NEEDED (FOR EDEMA). .  mexiletine (MEXITIL) 200 MG capsule, TAKE 1 CAPSULE BY MOUTH TWICE A DAY .  sacubitril-valsartan (ENTRESTO) 97-103 MG, Take 1 tablet by mouth 2 (two) times daily. [name]  spironolactone (ALDACTONE) 25 MG tablet, Take 1 tablet (25 mg total) by mouth at bedtime. .  torsemide (DEMADEX) 20 MG tablet, Take 1 tablet (20 mg total) by mouth daily as needed.  Current Outpatient Medications (Respiratory):  .  albuterol (VENTOLIN HFA) 108 (90 Base) MCG/ACT inhaler, Inhale 1-2 puffs into the lungs every 6 (six) hours as needed for wheezing or shortness of breath. [name]  azelastine (ASTELIN) 0.1 % nasal spray, Place into both nostrils. .  Budesonide 90 MCG/ACT inhaler, Inhale 1 puff into the lungs 2 (two) times daily. .  Fluticasone-Salmeterol (WIXELA INHUB) 250-50 MCG/DOSE AEPB, Inhale 1 puff into the lungs 2 (two) times daily. .  mupirocin nasal ointment (BACTROBAN) 2 %, Place 1 application into the nose 2 (two) times daily. Use one-half of tube in each nostril twice daily for five (5) days.  After application, press sides of nose together and gently massage.  Current Outpatient Medications (Analgesics):  .  allopurinol (ZYLOPRIM) 100 MG tablet, TAKE TWO TABLETS BY MOUTH DAILY .  aspirin EC 81 MG tablet, Take 81 mg by mouth daily. .  colchicine 0.6 MG tablet, Take 1 tablet (0.6 mg total) by mouth daily.  Current Outpatient Medications (Hematological):  .  clopidogrel (PLAVIX) 75 MG tablet, TAKE ONE TABLET BY MOUTH DAILY  Current Outpatient Medications (Other):  [name]  ALPRAZolam (XANAX) 0.5 MG tablet, TAKE ONE TABLET BY MOUTH DAILY AS NEEDED FOR ANXIETY .  anastrozole (ARIMIDEX) 1 MG tablet, Take 1 mg by mouth daily. .  Cholecalciferol (THERA-D 2000) 50 MCG (2000 UT) TABS, 1 tab by mouth once daily .  Coenzyme Q10 (CO Q 10) 100 MG CAPS, Take 100 mg by mouth daily.  [name]  doxycycline (VIBRA-TABS) 100 MG tablet, Take 1 tablet (100 mg total) by mouth 2 (two) times daily. [name]  KLOR-CON M20 20 MEQ tablet, TAKE 2 TABLETS BY MOUTH EVERY DAY .  sertraline (ZOLOFT) 50 MG tablet, Take 1 tablet (50 mg total) by mouth daily. [name]  tiZANidine (ZANAFLEX) 4 MG tablet, TAKE ONE TABLET BY MOUTH TWICE A DAY AS NEEDED

## 2020-05-02 NOTE — Assessment & Plan Note (Signed)
For testosterone lab today, suspect being overtreated and likely needs lower testosterone replacement dose, and then be able to avoid donating blood to avoid polycythemia symptoms

## 2020-05-02 NOTE — Assessment & Plan Note (Signed)
Last vitamin D Lab Results  Component Value Date   VD25OH 18.81 (L) 05/02/2020   Low, to start oral replacement  

## 2020-05-02 NOTE — Assessment & Plan Note (Signed)
Lab Results  Component Value Date   HGBA1C 5.7 05/02/2020   Stable, pt to continue current medical treatment  - diet and wt control

## 2020-05-02 NOTE — Assessment & Plan Note (Signed)
For f/u lab today, pt plans to continue blood donation as a type of self treatment

## 2020-05-05 MED ORDER — WEGOVY 0.25 MG/0.5ML ~~LOC~~ SOAJ: 0.2500 mg | SUBCUTANEOUS | 5 refills | Status: DC

## 2020-05-14 ENCOUNTER — Other Ambulatory Visit (HOSPITAL_COMMUNITY): Payer: Self-pay | Admitting: Cardiology

## 2020-05-16 ENCOUNTER — Ambulatory Visit (HOSPITAL_COMMUNITY)
Admission: RE | Admit: 2020-05-16 | Discharge: 2020-05-16 | Disposition: A | Discharge status: Home / Self Care | Payer: 59 | Source: Ambulatory Visit | Attending: Cardiology | Admitting: Cardiology

## 2020-05-16 ENCOUNTER — Encounter (HOSPITAL_COMMUNITY): Payer: Self-pay | Admitting: Cardiology

## 2020-05-16 ENCOUNTER — Telehealth (HOSPITAL_COMMUNITY): Payer: Self-pay | Admitting: Pharmacy Technician

## 2020-05-16 ENCOUNTER — Encounter (HOSPITAL_COMMUNITY): Payer: Self-pay

## 2020-05-16 ENCOUNTER — Other Ambulatory Visit: Payer: Self-pay

## 2020-05-16 VITALS — BP 110/70 | HR 92 | Wt 229.4 lb

## 2020-05-16 DIAGNOSIS — Z7982 Long term (current) use of aspirin: Secondary | ICD-10-CM | POA: Diagnosis not present

## 2020-05-16 DIAGNOSIS — D751 Secondary polycythemia: Secondary | ICD-10-CM | POA: Insufficient documentation

## 2020-05-16 DIAGNOSIS — Z79899 Other long term (current) drug therapy: Secondary | ICD-10-CM | POA: Insufficient documentation

## 2020-05-16 DIAGNOSIS — E785 Hyperlipidemia, unspecified: Secondary | ICD-10-CM | POA: Diagnosis not present

## 2020-05-16 DIAGNOSIS — M791 Myalgia, unspecified site: Secondary | ICD-10-CM | POA: Diagnosis not present

## 2020-05-16 DIAGNOSIS — I2510 Atherosclerotic heart disease of native coronary artery without angina pectoris: Secondary | ICD-10-CM | POA: Diagnosis not present

## 2020-05-16 DIAGNOSIS — Z7984 Long term (current) use of oral hypoglycemic drugs: Secondary | ICD-10-CM | POA: Insufficient documentation

## 2020-05-16 DIAGNOSIS — I110 Hypertensive heart disease with heart failure: Secondary | ICD-10-CM | POA: Diagnosis not present

## 2020-05-16 DIAGNOSIS — I252 Old myocardial infarction: Secondary | ICD-10-CM | POA: Diagnosis not present

## 2020-05-16 DIAGNOSIS — I255 Ischemic cardiomyopathy: Secondary | ICD-10-CM | POA: Insufficient documentation

## 2020-05-16 DIAGNOSIS — Z955 Presence of coronary angioplasty implant and graft: Secondary | ICD-10-CM | POA: Diagnosis not present

## 2020-05-16 DIAGNOSIS — F419 Anxiety disorder, unspecified: Secondary | ICD-10-CM | POA: Diagnosis not present

## 2020-05-16 DIAGNOSIS — Z7951 Long term (current) use of inhaled steroids: Secondary | ICD-10-CM | POA: Diagnosis not present

## 2020-05-16 DIAGNOSIS — I5022 Chronic systolic (congestive) heart failure: Secondary | ICD-10-CM

## 2020-05-16 DIAGNOSIS — Z7902 Long term (current) use of antithrombotics/antiplatelets: Secondary | ICD-10-CM | POA: Diagnosis not present

## 2020-05-16 DIAGNOSIS — R002 Palpitations: Secondary | ICD-10-CM | POA: Diagnosis not present

## 2020-05-16 DIAGNOSIS — Z7901 Long term (current) use of anticoagulants: Secondary | ICD-10-CM | POA: Insufficient documentation

## 2020-05-16 MED ORDER — DAPAGLIFLOZIN PROPANEDIOL 10 MG PO TABS: 10.0000 mg | ORAL_TABLET | Freq: Every day | ORAL | 11 refills | Status: DC
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
## 2020-05-16 NOTE — Telephone Encounter (Signed)
Patient was seen in clinic today and started on Farxiga. The insurance information we have states that the patient is not covered. The patient is going to find his new card and scan it to [place]. I advised that we send him out with a 30 day free card and a co-pay card in the meantime. The patient previously had commercial insurance.  We will seek manufacturer assistance if he truly is uninsured.

## 2020-05-16 NOTE — Patient Instructions (Addendum)
EKG done today.  No Labs done today.   START Farxiga 10mg  (1 tablet) by mouth daily.  No other medication changes were made. Please continue all current medications as prescribed.  Your physician recommends that you schedule a follow-up appointment in: 10 days for a lab only appointment and in 4 months for an appointment with Dr. with an echo prior to your appointment. Please contact our office in June to schedule a July appointment.   Your physician has requested that you have an echocardiogram. Echocardiography is a painless test that uses sound waves to create images of your heart. It provides your doctor with information about the size and shape of your heart and how well your heart's chambers and valves are working. This procedure takes approximately one hour. There are no restrictions for this procedure.  If you have any questions or concerns before your next appointment please send August a message through [facility] or call our office at [phone].    TO LEAVE A MESSAGE FOR THE NURSE SELECT OPTION 2, PLEASE LEAVE A MESSAGE INCLUDING: . YOUR NAME . DATE OF BIRTH . CALL BACK NUMBER . REASON FOR CALL**this is important as we prioritize the call backs  YOU WILL RECEIVE A CALL BACK THE SAME DAY AS LONG AS YOU CALL BEFORE 4:00 PM   Do the following things EVERYDAY: 1) Weigh yourself in the morning before breakfast. Write it down and keep it in a log. 2) Take your medicines as prescribed 3) Eat low salt foods--Limit salt (sodium) to 2000 mg per day.  4) Stay as active as you can everyday 5) Limit all fluids for the day to less than 2 liters   At the [facility], you and your health needs are our priority. As part of our continuing mission to provide you with exceptional heart care, we have created designated Provider Care Teams. These Care Teams include your primary Cardiologist (physician) and Advanced Practice Providers (APPs- Physician Assistants and Nurse  Practitioners) who all work together to provide you with the care you need, when you need it.   You may see any of the following providers on your designated Care Team at your next follow up: [phone] Dr [name] . Dr Arvilla Meres . Marca Ancona, NP . Tonye Becket, PA . Robbie Lis, PharmD   Please be sure to bring in all your medications bottles to every appointment.

## 2020-05-19 MED ORDER — PREDNISONE 10 MG PO TABS: ORAL_TABLET | ORAL | 0 refills | Status: DC

## 2020-05-19 NOTE — Addendum Note (Signed)
Addended by: Corwin Levins on: 05/19/2020 12:54 PM   Modules accepted: Orders

## 2020-05-23 ENCOUNTER — Ambulatory Visit: Payer: Self-pay

## 2020-05-23 ENCOUNTER — Other Ambulatory Visit: Payer: Self-pay

## 2020-05-24 ENCOUNTER — Telehealth: Payer: Self-pay

## 2020-05-24 LAB — CUP PACEART REMOTE DEVICE CHECK
Battery Remaining Longevity: 10 mo
Battery Remaining Percentage: 10 %
Battery Voltage: 2.68 V
Brady Statistic RV Percent Paced: 1 %
Date Time Interrogation Session: 20220405010330
HighPow Impedance: 51 Ohm
Implantable Lead Implant Date: 20111010
Implantable Lead Location: 753860
Implantable Lead Model: 7121
Implantable Pulse Generator Implant Date: 20111010
Lead Channel Impedance Value: 1100 Ohm
Lead Channel Pacing Threshold Amplitude: 2 V
Lead Channel Pacing Threshold Pulse Width: 1 ms
Lead Channel Sensing Intrinsic Amplitude: 11.9 mV
Lead Channel Setting Pacing Amplitude: 4 V
Lead Channel Setting Pacing Pulse Width: 1 ms
Lead Channel Setting Sensing Sensitivity: 0.5 mV
Pulse Gen Serial Number: 615318

## 2020-05-24 MED ORDER — EZETIMIBE 10 MG PO TABS: 10.0000 mg | ORAL_TABLET | Freq: Every day | ORAL | 11 refills | Status: DC

## 2020-05-24 NOTE — Telephone Encounter (Signed)
Monthly battery checks increased in Epic and [facility]. Attempted to call patient to advise. No answer, LMOVM.  

## 2020-05-26 ENCOUNTER — Other Ambulatory Visit (HOSPITAL_COMMUNITY): Payer: 59

## 2020-06-02 ENCOUNTER — Other Ambulatory Visit (HOSPITAL_COMMUNITY): Payer: Self-pay | Admitting: Cardiology

## 2020-06-02 NOTE — Telephone Encounter (Signed)
Attempted to contact patient about monthly battery checks. Patient's voicemail is full. Patient has 10.4 months until ERI from last check on 05/23/2020.

## 2020-06-06 NOTE — Telephone Encounter (Signed)
LMOM to call to advise on monthly battery checks.

## 2020-06-07 NOTE — Telephone Encounter (Signed)
Certified letter sent 

## 2020-06-13 NOTE — Telephone Encounter (Signed)
Pt left a message stating he received his certified letter and would like for the nurse to call him back.

## 2020-06-14 NOTE — Telephone Encounter (Signed)
Patient notified of monthly battery checks due to proximity to ERI. Patient given dates he is scheduled to send home remote transmissions and verbalized understanding of monthly battery checks.

## 2020-06-19 ENCOUNTER — Encounter (HOSPITAL_COMMUNITY): Payer: Self-pay

## 2020-06-26 ENCOUNTER — Ambulatory Visit (INDEPENDENT_AMBULATORY_CARE_PROVIDER_SITE_OTHER): Payer: 59

## 2020-06-26 DIAGNOSIS — I5022 Chronic systolic (congestive) heart failure: Secondary | ICD-10-CM

## 2020-06-26 DIAGNOSIS — I255 Ischemic cardiomyopathy: Secondary | ICD-10-CM

## 2020-06-27 ENCOUNTER — Other Ambulatory Visit: Payer: Self-pay

## 2020-06-27 ENCOUNTER — Ambulatory Visit (HOSPITAL_COMMUNITY)
Admission: RE | Admit: 2020-06-27 | Discharge: 2020-06-27 | Disposition: A | Discharge status: Home / Self Care | Payer: 59 | Source: Ambulatory Visit | Attending: Cardiology | Admitting: Cardiology

## 2020-06-27 DIAGNOSIS — I5022 Chronic systolic (congestive) heart failure: Secondary | ICD-10-CM

## 2020-06-27 LAB — CUP PACEART REMOTE DEVICE CHECK
Battery Remaining Longevity: 9 mo
Battery Remaining Percentage: 9 %
Battery Voltage: 2.66 V
Brady Statistic RV Percent Paced: 1 %
Date Time Interrogation Session: 20220509031415
HighPow Impedance: 49 Ohm
Implantable Lead Implant Date: 20111010
Implantable Lead Location: 753860
Implantable Lead Model: 7121
Implantable Pulse Generator Implant Date: 20111010
Lead Channel Impedance Value: 1050 Ohm
Lead Channel Pacing Threshold Amplitude: 2 V
Lead Channel Pacing Threshold Pulse Width: 1 ms
Lead Channel Sensing Intrinsic Amplitude: 11.9 mV
Lead Channel Setting Pacing Amplitude: 4 V
Lead Channel Setting Pacing Pulse Width: 1 ms
Lead Channel Setting Sensing Sensitivity: 0.5 mV
Pulse Gen Serial Number: 615318

## 2020-06-27 LAB — BASIC METABOLIC PANEL
Anion gap: 5 (ref 5–15)
BUN: 13 mg/dL (ref 6–20)
CO2: 26 mmol/L (ref 22–32)
Calcium: 9.2 mg/dL (ref 8.9–10.3)
Chloride: 107 mmol/L (ref 98–111)
Creatinine, Ser: 1.18 mg/dL (ref 0.61–1.24)
GFR, Estimated: >60 mL/min (ref >60)
Glucose, Bld: 80 mg/dL (ref 70–99)
Potassium: 4 mmol/L (ref 3.5–5.1)
Sodium: 138 mmol/L (ref 135–145)

## 2020-07-07 ENCOUNTER — Other Ambulatory Visit (HOSPITAL_COMMUNITY): Payer: Self-pay | Admitting: Cardiology

## 2020-07-18 NOTE — Addendum Note (Signed)
Addended by: Geralyn Flash D on: 07/18/2020 12:33 PM   Modules accepted: Level of Service

## 2020-07-18 NOTE — Progress Notes (Signed)
Remote ICD transmission.   

## 2020-07-20 ENCOUNTER — Other Ambulatory Visit: Payer: Self-pay | Admitting: Internal Medicine

## 2020-07-27 ENCOUNTER — Ambulatory Visit (INDEPENDENT_AMBULATORY_CARE_PROVIDER_SITE_OTHER): Payer: 59

## 2020-07-27 DIAGNOSIS — I255 Ischemic cardiomyopathy: Secondary | ICD-10-CM

## 2020-07-27 LAB — CUP PACEART REMOTE DEVICE CHECK
Battery Remaining Longevity: 9 mo
Battery Remaining Percentage: 9 %
Battery Voltage: 2.66 V
Brady Statistic RV Percent Paced: 1 %
Date Time Interrogation Session: 20220609020013
HighPow Impedance: 51 Ohm
Implantable Lead Implant Date: 20111010
Implantable Lead Location: 753860
Implantable Lead Model: 7121
Implantable Pulse Generator Implant Date: 20111010
Lead Channel Impedance Value: 1100 Ohm
Lead Channel Pacing Threshold Amplitude: 2 V
Lead Channel Pacing Threshold Pulse Width: 1 ms
Lead Channel Sensing Intrinsic Amplitude: 11.9 mV
Lead Channel Setting Pacing Amplitude: 4 V
Lead Channel Setting Pacing Pulse Width: 1 ms
Lead Channel Setting Sensing Sensitivity: 0.5 mV
Pulse Gen Serial Number: 615318

## 2020-08-01 ENCOUNTER — Other Ambulatory Visit: Payer: Self-pay | Admitting: Family Medicine

## 2020-08-07 ENCOUNTER — Other Ambulatory Visit: Payer: Self-pay

## 2020-08-07 ENCOUNTER — Ambulatory Visit (HOSPITAL_COMMUNITY)
Admission: RE | Admit: 2020-08-07 | Discharge: 2020-08-07 | Disposition: A | Discharge status: Home / Self Care | Payer: 59 | Source: Ambulatory Visit | Attending: Internal Medicine | Admitting: Internal Medicine

## 2020-08-07 DIAGNOSIS — I2510 Atherosclerotic heart disease of native coronary artery without angina pectoris: Secondary | ICD-10-CM | POA: Insufficient documentation

## 2020-08-07 DIAGNOSIS — I110 Hypertensive heart disease with heart failure: Secondary | ICD-10-CM | POA: Insufficient documentation

## 2020-08-07 DIAGNOSIS — I5022 Chronic systolic (congestive) heart failure: Secondary | ICD-10-CM | POA: Diagnosis present

## 2020-08-07 DIAGNOSIS — I252 Old myocardial infarction: Secondary | ICD-10-CM | POA: Diagnosis not present

## 2020-08-07 DIAGNOSIS — Z95810 Presence of automatic (implantable) cardiac defibrillator: Secondary | ICD-10-CM | POA: Diagnosis not present

## 2020-08-07 LAB — ECHOCARDIOGRAM COMPLETE
Area-P 1/2: 3.99 cm2
S' Lateral: 4 cm
Single Plane A4C EF: 41.3 %

## 2020-08-07 MED ORDER — PERFLUTREN LIPID MICROSPHERE
1.0000 mL | INTRAVENOUS | Status: AC | PRN
  Administered 2020-08-07: 2 mL via INTRAVENOUS
  Filled 2020-08-07: qty 10

## 2020-08-07 NOTE — Progress Notes (Signed)
  Echocardiogram 2D Echocardiogram has been performed.  Janalyn Harder 08/07/2020, 3:21 PM

## 2020-08-10 ENCOUNTER — Encounter (HOSPITAL_COMMUNITY): Payer: Self-pay

## 2020-08-15 NOTE — Progress Notes (Signed)
Monthly battery checks are scheduled. 

## 2020-08-18 NOTE — Addendum Note (Signed)
Addended by: Geralyn Flash D on: 08/18/2020 02:32 PM   Modules accepted: Level of Service

## 2020-08-28 ENCOUNTER — Ambulatory Visit (INDEPENDENT_AMBULATORY_CARE_PROVIDER_SITE_OTHER): Payer: 59

## 2020-08-28 DIAGNOSIS — I5022 Chronic systolic (congestive) heart failure: Secondary | ICD-10-CM

## 2020-08-28 LAB — CUP PACEART REMOTE DEVICE CHECK
Battery Remaining Longevity: 8 mo
Battery Remaining Percentage: 7 %
Battery Voltage: 2.66 V
Brady Statistic RV Percent Paced: 1 %
Date Time Interrogation Session: 20220710020017
HighPow Impedance: 54 Ohm
Implantable Lead Implant Date: 20111010
Implantable Lead Location: 753860
Implantable Lead Model: 7121
Implantable Pulse Generator Implant Date: 20111010
Lead Channel Impedance Value: 1125 Ohm
Lead Channel Pacing Threshold Amplitude: 2 V
Lead Channel Pacing Threshold Pulse Width: 1 ms
Lead Channel Sensing Intrinsic Amplitude: 11.9 mV
Lead Channel Setting Pacing Amplitude: 4 V
Lead Channel Setting Pacing Pulse Width: 1 ms
Lead Channel Setting Sensing Sensitivity: 0.5 mV
Pulse Gen Serial Number: 615318

## 2020-09-19 ENCOUNTER — Encounter: Payer: Self-pay | Admitting: Family Medicine

## 2020-09-19 ENCOUNTER — Telehealth: Payer: Self-pay

## 2020-09-19 ENCOUNTER — Ambulatory Visit (INDEPENDENT_AMBULATORY_CARE_PROVIDER_SITE_OTHER): Payer: 59 | Admitting: Family Medicine

## 2020-09-19 ENCOUNTER — Other Ambulatory Visit: Payer: Self-pay

## 2020-09-19 ENCOUNTER — Ambulatory Visit: Payer: Self-pay

## 2020-09-19 VITALS — BP 100/76 | HR 75 | Ht 74.0 in | Wt 230.4 lb

## 2020-09-19 DIAGNOSIS — M67971 Unspecified disorder of synovium and tendon, right ankle and foot: Secondary | ICD-10-CM | POA: Diagnosis not present

## 2020-09-19 DIAGNOSIS — M7751 Other enthesopathy of right foot: Secondary | ICD-10-CM | POA: Diagnosis not present

## 2020-09-19 MED ORDER — PREDNISONE 50 MG PO TABS: 50.0000 mg | ORAL_TABLET | Freq: Every day | ORAL | 0 refills | Status: DC

## 2020-09-19 NOTE — Telephone Encounter (Signed)
Faxed for pcsk9 inhibitor renewal

## 2020-09-19 NOTE — Patient Instructions (Addendum)
Thank you for coming in today.   Take the prednisone for 5 days.  OK to stop it early or reduce the dose early.   OK to continue pensiad or voltaren gel .   Please go to [facility] supply to get the cam walker boot we talked about today. You may also be able to get it from [employer].   Use it as needed.   Do the exercises we discussed.   If not better let me know. I can do a shot.

## 2020-09-19 NOTE — Progress Notes (Signed)
   I, Christoper Fabian, LAT, ATC, am serving as scribe for Dr. Clementeen Graham.  Jason Hogan is a 45 y.o. male who presents to [facility] today for R Achille's/heel pain.  He was last seen by Dr. Katrinka Blazing on 01/26/20 for L foot pain.  Since then, pt reports R Achille's pain since Sunday w/ no known MOI but does recall playing basketball w/ his son that day.  He did not have any pain on Sunday but had severe pain on Monday morning.  He locates his pain to his R distal Achille's.  He has a hx of gout and takes his Allopurinol daily.  He notes some mild swelling at the Achille's.  He also has a hx of plantar fasciitis in his opp foot (L foot).  Swelling: slight/mild Aggravating factors: walking/weight-bearing activity Treatments tried: Tylenol; Flexeril; Pennsaid  Pertinent review of systems: No fevers or chills  Relevant historical information: Gout.  History of heart attack with AICD   Exam:  BP 100/76 (BP Location: Right Arm, Patient Position: Sitting, Cuff Size: Normal)   Pulse 75   Ht 6\' 2"  (1.88 m)   Wt 230 lb 6.4 oz (104.5 kg)   SpO2 99%   BMI 29.58 kg/m  General: Well Developed, well nourished, and in no acute distress.   MSK: Right posterior calcaneus normal-appearing Tender palpation at Achilles tendon insertion. Normal ankle motion. Pain with resisted foot dorsiflexion. Pulses cap refill and sensation are intact distally.    Lab and Radiology Results  Diagnostic Limited MSK Ultrasound of: Right Achilles tendon Normal-appearing intact Achilles tendon until insertion onto calcaneus. Hypoechoic fluid tracks deep to Achilles tendon at insertion on calcaneus consistent with retrocalcaneal bursitis. Small amount of hypoechoic fluid tracks superficial to Achilles tendon at the calcaneal insertion consistent with superficial bursitis. No tendon tears are visible. Normal appearing bony structures. Impression: Retrocalcaneal bursitis     Assessment  and Plan: 45 y.o. male with right posterior heel pain consistent with retrocalcaneal bursitis.  Discussed options.  Would like to try to avoid injection if possible.  Plan for cam walker boot Voltaren gel/Pennsaid and and oral prednisone.  When able to we will start eccentric exercises taught in clinic today by me.  If not improving in a week or 2 he will let me know return to clinic and proceed with retrocalcaneal bursa injection likely.   PDMP not reviewed this encounter. Orders Placed This Encounter  Procedures   59 LIMITED JOINT SPACE STRUCTURES LOW RIGHT(NO LINKED CHARGES)    Order Specific Question:   Reason for Exam (SYMPTOM  OR DIAGNOSIS REQUIRED)    Answer:   R Achilles pain    Order Specific Question:   Preferred imaging location?    Answer:   [facility] [place] ordered this encounter  Medications   predniSONE (DELTASONE) 50 MG tablet    Sig: Take 1 tablet (50 mg total) by mouth daily.    Dispense:  5 tablet    Refill:  0     Discussed warning signs or symptoms. Please see discharge instructions. Patient expresses understanding.   The above documentation has been reviewed and is accurate and complete [employer], M.D.

## 2020-09-19 NOTE — Progress Notes (Signed)
Remote ICD transmission.   

## 2020-09-27 LAB — CUP PACEART REMOTE DEVICE CHECK
Battery Remaining Longevity: 8 mo
Battery Remaining Percentage: 7 %
Battery Voltage: 2.65 V
Brady Statistic RV Percent Paced: 1 %
Date Time Interrogation Session: 20220810054936
HighPow Impedance: 52 Ohm
Implantable Lead Implant Date: 20111010
Implantable Lead Location: 753860
Implantable Lead Model: 7121
Implantable Pulse Generator Implant Date: 20111010
Lead Channel Impedance Value: 1050 Ohm
Lead Channel Pacing Threshold Amplitude: 2 V
Lead Channel Pacing Threshold Pulse Width: 1 ms
Lead Channel Sensing Intrinsic Amplitude: 11.9 mV
Lead Channel Setting Pacing Amplitude: 4 V
Lead Channel Setting Pacing Pulse Width: 1 ms
Lead Channel Setting Sensing Sensitivity: 0.5 mV
Pulse Gen Serial Number: 615318

## 2020-09-28 ENCOUNTER — Ambulatory Visit (INDEPENDENT_AMBULATORY_CARE_PROVIDER_SITE_OTHER): Payer: 59

## 2020-09-28 DIAGNOSIS — I255 Ischemic cardiomyopathy: Secondary | ICD-10-CM

## 2020-10-17 ENCOUNTER — Other Ambulatory Visit: Payer: Self-pay | Admitting: Internal Medicine

## 2020-10-20 ENCOUNTER — Other Ambulatory Visit (HOSPITAL_COMMUNITY): Payer: Self-pay | Admitting: Cardiology

## 2020-10-20 NOTE — Progress Notes (Signed)
Remote ICD transmission.   

## 2020-10-30 ENCOUNTER — Ambulatory Visit (INDEPENDENT_AMBULATORY_CARE_PROVIDER_SITE_OTHER): Payer: 59

## 2020-10-30 DIAGNOSIS — I255 Ischemic cardiomyopathy: Secondary | ICD-10-CM

## 2020-10-30 DIAGNOSIS — I5022 Chronic systolic (congestive) heart failure: Secondary | ICD-10-CM

## 2020-11-01 LAB — CUP PACEART REMOTE DEVICE CHECK
Battery Remaining Longevity: 8 mo
Battery Remaining Percentage: 7 %
Battery Voltage: 2.65 V
Brady Statistic RV Percent Paced: 1 %
Date Time Interrogation Session: 20220910020021
HighPow Impedance: 48 Ohm
Implantable Lead Implant Date: 20111010
Implantable Lead Location: 753860
Implantable Lead Model: 7121
Implantable Pulse Generator Implant Date: 20111010
Lead Channel Impedance Value: 1050 Ohm
Lead Channel Pacing Threshold Amplitude: 2 V
Lead Channel Pacing Threshold Pulse Width: 1 ms
Lead Channel Sensing Intrinsic Amplitude: 11.9 mV
Lead Channel Setting Pacing Amplitude: 4 V
Lead Channel Setting Pacing Pulse Width: 1 ms
Lead Channel Setting Sensing Sensitivity: 0.5 mV
Pulse Gen Serial Number: 615318

## 2020-11-03 ENCOUNTER — Encounter: Payer: Self-pay | Admitting: Family Medicine

## 2020-11-07 NOTE — Addendum Note (Signed)
Addended by: Geralyn Flash D on: 11/07/2020 03:17 PM   Modules accepted: Level of Service

## 2020-11-07 NOTE — Progress Notes (Signed)
Remote ICD transmission.   

## 2020-11-10 ENCOUNTER — Encounter: Payer: Self-pay | Admitting: Internal Medicine

## 2020-11-13 MED ORDER — NIRMATRELVIR/RITONAVIR (PAXLOVID)TABLET: 3.0000 | ORAL_TABLET | Freq: Two times a day (BID) | ORAL | 0 refills | Status: AC

## 2020-11-17 ENCOUNTER — Other Ambulatory Visit (HOSPITAL_COMMUNITY): Payer: Self-pay | Admitting: Cardiology

## 2020-11-17 ENCOUNTER — Other Ambulatory Visit: Payer: Self-pay | Admitting: Internal Medicine

## 2020-11-24 ENCOUNTER — Other Ambulatory Visit (HOSPITAL_COMMUNITY): Payer: Self-pay | Admitting: Cardiology

## 2020-11-28 ENCOUNTER — Other Ambulatory Visit (HOSPITAL_COMMUNITY): Payer: Self-pay | Admitting: Cardiology

## 2020-11-30 ENCOUNTER — Ambulatory Visit (INDEPENDENT_AMBULATORY_CARE_PROVIDER_SITE_OTHER): Payer: 59

## 2020-11-30 DIAGNOSIS — I255 Ischemic cardiomyopathy: Secondary | ICD-10-CM | POA: Diagnosis not present

## 2020-11-30 LAB — CUP PACEART REMOTE DEVICE CHECK
Battery Remaining Longevity: 6 mo
Battery Remaining Longevity: 6 mo
Battery Remaining Percentage: 6 %
Battery Remaining Percentage: 6 %
Battery Voltage: 2.63 V
Battery Voltage: 2.63 V
Brady Statistic RV Percent Paced: 1 %
Brady Statistic RV Percent Paced: 1 %
Date Time Interrogation Session: 20221011020017
Date Time Interrogation Session: 20221013020858
HighPow Impedance: 51 Ohm
HighPow Impedance: 49 Ohm
Implantable Lead Implant Date: 20111010
Implantable Lead Implant Date: 20111010
Implantable Lead Location: 753860
Implantable Lead Location: 753860
Implantable Lead Model: 7121
Implantable Lead Model: 7121
Implantable Pulse Generator Implant Date: 20111010
Implantable Pulse Generator Implant Date: 20111010
Lead Channel Impedance Value: 1050 Ohm
Lead Channel Impedance Value: 1050 Ohm
Lead Channel Pacing Threshold Amplitude: 2 V
Lead Channel Pacing Threshold Amplitude: 2 V
Lead Channel Pacing Threshold Pulse Width: 1 ms
Lead Channel Pacing Threshold Pulse Width: 1 ms
Lead Channel Sensing Intrinsic Amplitude: 11.9 mV
Lead Channel Sensing Intrinsic Amplitude: 11.9 mV
Lead Channel Setting Pacing Amplitude: 4 V
Lead Channel Setting Pacing Amplitude: 4 V
Lead Channel Setting Pacing Pulse Width: 1 ms
Lead Channel Setting Pacing Pulse Width: 1 ms
Lead Channel Setting Sensing Sensitivity: 0.5 mV
Lead Channel Setting Sensing Sensitivity: 0.5 mV
Pulse Gen Serial Number: 615318
Pulse Gen Serial Number: 615318

## 2020-12-03 ENCOUNTER — Other Ambulatory Visit: Payer: Self-pay | Admitting: Family Medicine

## 2020-12-08 NOTE — Progress Notes (Signed)
Remote ICD transmission.   

## 2020-12-11 ENCOUNTER — Other Ambulatory Visit: Payer: Self-pay | Admitting: Cardiology

## 2021-01-01 ENCOUNTER — Ambulatory Visit (INDEPENDENT_AMBULATORY_CARE_PROVIDER_SITE_OTHER): Payer: 59

## 2021-01-01 DIAGNOSIS — I255 Ischemic cardiomyopathy: Secondary | ICD-10-CM

## 2021-01-02 LAB — CUP PACEART REMOTE DEVICE CHECK
Battery Remaining Longevity: 5 mo
Battery Remaining Percentage: 4 %
Battery Voltage: 2.62 V
Brady Statistic RV Percent Paced: 1 %
Date Time Interrogation Session: 20221113031942
HighPow Impedance: 50 Ohm
Implantable Lead Implant Date: 20111010
Implantable Lead Location: 753860
Implantable Lead Model: 7121
Implantable Pulse Generator Implant Date: 20111010
Lead Channel Impedance Value: 1050 Ohm
Lead Channel Pacing Threshold Amplitude: 2 V
Lead Channel Pacing Threshold Pulse Width: 1 ms
Lead Channel Sensing Intrinsic Amplitude: 11.9 mV
Lead Channel Setting Pacing Amplitude: 4 V
Lead Channel Setting Pacing Pulse Width: 1 ms
Lead Channel Setting Sensing Sensitivity: 0.5 mV
Pulse Gen Serial Number: 615318

## 2021-01-08 NOTE — Addendum Note (Signed)
Addended by: Geralyn Flash D on: 01/08/2021 12:11 PM   Modules accepted: Level of Service

## 2021-01-08 NOTE — Progress Notes (Signed)
Remote ICD transmission.   

## 2021-01-22 ENCOUNTER — Encounter: Payer: Self-pay | Admitting: Cardiovascular Disease

## 2021-01-24 NOTE — Progress Notes (Signed)
Monthly battery check already scheduled.  

## 2021-01-31 LAB — CUP PACEART REMOTE DEVICE CHECK
Battery Remaining Longevity: 5 mo
Battery Remaining Percentage: 4 %
Battery Voltage: 2.62 V
Brady Statistic RV Percent Paced: 1 %
Date Time Interrogation Session: 20221214020010
HighPow Impedance: 51 Ohm
Implantable Lead Implant Date: 20111010
Implantable Lead Location: 753860
Implantable Lead Model: 7121
Implantable Pulse Generator Implant Date: 20111010
Lead Channel Impedance Value: 1100 Ohm
Lead Channel Pacing Threshold Amplitude: 2 V
Lead Channel Pacing Threshold Pulse Width: 1 ms
Lead Channel Sensing Intrinsic Amplitude: 11.9 mV
Lead Channel Setting Pacing Amplitude: 4 V
Lead Channel Setting Pacing Pulse Width: 1 ms
Lead Channel Setting Sensing Sensitivity: 0.5 mV
Pulse Gen Serial Number: 615318

## 2021-02-01 ENCOUNTER — Ambulatory Visit (INDEPENDENT_AMBULATORY_CARE_PROVIDER_SITE_OTHER): Payer: 59

## 2021-02-01 DIAGNOSIS — I255 Ischemic cardiomyopathy: Secondary | ICD-10-CM

## 2021-02-07 NOTE — Telephone Encounter (Signed)
This encounter was created in error - please disregard.

## 2021-02-13 ENCOUNTER — Other Ambulatory Visit: Payer: Self-pay | Admitting: Internal Medicine

## 2021-02-13 ENCOUNTER — Other Ambulatory Visit: Payer: Self-pay | Admitting: Cardiology

## 2021-02-13 NOTE — Progress Notes (Signed)
Remote ICD transmission.   

## 2021-02-17 ENCOUNTER — Other Ambulatory Visit: Payer: Self-pay | Admitting: Cardiology

## 2021-03-04 ENCOUNTER — Inpatient Hospital Stay (HOSPITAL_BASED_OUTPATIENT_CLINIC_OR_DEPARTMENT_OTHER)
Admission: EM | Admit: 2021-03-04 | Discharge: 2021-03-06 | DRG: 683 | Disposition: A | Discharge status: Home / Self Care | Payer: Self-pay | Attending: Internal Medicine | Admitting: Internal Medicine

## 2021-03-04 ENCOUNTER — Other Ambulatory Visit: Payer: Self-pay

## 2021-03-04 ENCOUNTER — Emergency Department (HOSPITAL_BASED_OUTPATIENT_CLINIC_OR_DEPARTMENT_OTHER): Payer: Self-pay

## 2021-03-04 ENCOUNTER — Encounter (HOSPITAL_BASED_OUTPATIENT_CLINIC_OR_DEPARTMENT_OTHER): Payer: Self-pay | Admitting: Emergency Medicine

## 2021-03-04 DIAGNOSIS — Z9861 Coronary angioplasty status: Secondary | ICD-10-CM

## 2021-03-04 DIAGNOSIS — J4521 Mild intermittent asthma with (acute) exacerbation: Secondary | ICD-10-CM

## 2021-03-04 DIAGNOSIS — I2510 Atherosclerotic heart disease of native coronary artery without angina pectoris: Secondary | ICD-10-CM | POA: Diagnosis present

## 2021-03-04 DIAGNOSIS — T466X5A Adverse effect of antihyperlipidemic and antiarteriosclerotic drugs, initial encounter: Secondary | ICD-10-CM | POA: Diagnosis present

## 2021-03-04 DIAGNOSIS — K909 Intestinal malabsorption, unspecified: Secondary | ICD-10-CM | POA: Diagnosis present

## 2021-03-04 DIAGNOSIS — N179 Acute kidney failure, unspecified: Principal | ICD-10-CM | POA: Diagnosis present

## 2021-03-04 DIAGNOSIS — R002 Palpitations: Secondary | ICD-10-CM | POA: Diagnosis present

## 2021-03-04 DIAGNOSIS — Z79899 Other long term (current) drug therapy: Secondary | ICD-10-CM

## 2021-03-04 DIAGNOSIS — Z833 Family history of diabetes mellitus: Secondary | ICD-10-CM

## 2021-03-04 DIAGNOSIS — M10271 Drug-induced gout, right ankle and foot: Secondary | ICD-10-CM

## 2021-03-04 DIAGNOSIS — G720 Drug-induced myopathy: Secondary | ICD-10-CM | POA: Diagnosis present

## 2021-03-04 DIAGNOSIS — I252 Old myocardial infarction: Secondary | ICD-10-CM

## 2021-03-04 DIAGNOSIS — J45909 Unspecified asthma, uncomplicated: Secondary | ICD-10-CM | POA: Diagnosis present

## 2021-03-04 DIAGNOSIS — M109 Gout, unspecified: Secondary | ICD-10-CM | POA: Diagnosis present

## 2021-03-04 DIAGNOSIS — I959 Hypotension, unspecified: Secondary | ICD-10-CM | POA: Diagnosis present

## 2021-03-04 DIAGNOSIS — R197 Diarrhea, unspecified: Secondary | ICD-10-CM | POA: Diagnosis present

## 2021-03-04 DIAGNOSIS — E785 Hyperlipidemia, unspecified: Secondary | ICD-10-CM | POA: Diagnosis present

## 2021-03-04 DIAGNOSIS — Z8679 Personal history of other diseases of the circulatory system: Secondary | ICD-10-CM

## 2021-03-04 DIAGNOSIS — G43909 Migraine, unspecified, not intractable, without status migrainosus: Secondary | ICD-10-CM | POA: Diagnosis present

## 2021-03-04 DIAGNOSIS — Z7951 Long term (current) use of inhaled steroids: Secondary | ICD-10-CM

## 2021-03-04 DIAGNOSIS — I472 Ventricular tachycardia, unspecified: Secondary | ICD-10-CM | POA: Diagnosis present

## 2021-03-04 DIAGNOSIS — Z20822 Contact with and (suspected) exposure to covid-19: Secondary | ICD-10-CM | POA: Diagnosis present

## 2021-03-04 DIAGNOSIS — I10 Essential (primary) hypertension: Secondary | ICD-10-CM | POA: Diagnosis present

## 2021-03-04 DIAGNOSIS — M545 Low back pain, unspecified: Secondary | ICD-10-CM | POA: Diagnosis present

## 2021-03-04 DIAGNOSIS — Z7902 Long term (current) use of antithrombotics/antiplatelets: Secondary | ICD-10-CM

## 2021-03-04 DIAGNOSIS — E860 Dehydration: Secondary | ICD-10-CM | POA: Diagnosis present

## 2021-03-04 DIAGNOSIS — I255 Ischemic cardiomyopathy: Secondary | ICD-10-CM | POA: Diagnosis present

## 2021-03-04 DIAGNOSIS — Z87891 Personal history of nicotine dependence: Secondary | ICD-10-CM

## 2021-03-04 DIAGNOSIS — Z888 Allergy status to other drugs, medicaments and biological substances status: Secondary | ICD-10-CM

## 2021-03-04 DIAGNOSIS — F32A Depression, unspecified: Secondary | ICD-10-CM | POA: Diagnosis present

## 2021-03-04 DIAGNOSIS — F419 Anxiety disorder, unspecified: Secondary | ICD-10-CM | POA: Diagnosis present

## 2021-03-04 DIAGNOSIS — Z95810 Presence of automatic (implantable) cardiac defibrillator: Secondary | ICD-10-CM | POA: Diagnosis present

## 2021-03-04 DIAGNOSIS — Z8249 Family history of ischemic heart disease and other diseases of the circulatory system: Secondary | ICD-10-CM

## 2021-03-04 DIAGNOSIS — K529 Noninfective gastroenteritis and colitis, unspecified: Secondary | ICD-10-CM | POA: Diagnosis present

## 2021-03-04 DIAGNOSIS — I5022 Chronic systolic (congestive) heart failure: Secondary | ICD-10-CM | POA: Diagnosis present

## 2021-03-04 DIAGNOSIS — Z7982 Long term (current) use of aspirin: Secondary | ICD-10-CM

## 2021-03-04 DIAGNOSIS — I110 Hypertensive heart disease with heart failure: Secondary | ICD-10-CM | POA: Diagnosis present

## 2021-03-04 LAB — COMPREHENSIVE METABOLIC PANEL
ALT: 12 U/L (ref 0–44)
AST: 14 U/L — ABNORMAL LOW (ref 15–41)
Albumin: 4.3 g/dL (ref 3.5–5.0)
Alkaline Phosphatase: 82 U/L (ref 38–126)
Anion gap: 11 (ref 5–15)
BUN: 17 mg/dL (ref 6–20)
CO2: 18 mmol/L — ABNORMAL LOW (ref 22–32)
Calcium: 9 mg/dL (ref 8.9–10.3)
Chloride: 106 mmol/L (ref 98–111)
Creatinine, Ser: 1.34 mg/dL — ABNORMAL HIGH (ref 0.61–1.24)
GFR, Estimated: >60 mL/min (ref >60)
Glucose, Bld: 114 mg/dL — ABNORMAL HIGH (ref 70–99)
Potassium: 3.6 mmol/L (ref 3.5–5.1)
Sodium: 135 mmol/L (ref 135–145)
Total Bilirubin: 1 mg/dL (ref 0.3–1.2)
Total Protein: 8.2 g/dL — ABNORMAL HIGH (ref 6.5–8.1)

## 2021-03-04 LAB — CBC
HCT: 54.8 % — ABNORMAL HIGH (ref 39.0–52.0)
Hemoglobin: 16.8 g/dL (ref 13.0–17.0)
MCH: 22.4 pg — ABNORMAL LOW (ref 26.0–34.0)
MCHC: 30.7 g/dL (ref 30.0–36.0)
MCV: 73.2 fL — ABNORMAL LOW (ref 80.0–100.0)
Platelets: 297 10*3/uL (ref 150–400)
RBC: 7.49 MIL/uL — ABNORMAL HIGH (ref 4.22–5.81)
RDW: 20.6 % — ABNORMAL HIGH (ref 11.5–15.5)
WBC: 8.2 10*3/uL (ref 4.0–10.5)
nRBC: 0 % (ref 0.0–0.2)

## 2021-03-04 LAB — C DIFFICILE QUICK SCREEN W PCR REFLEX
C Diff antigen: NEGATIVE
C Diff interpretation: NOT DETECTED
C Diff toxin: NEGATIVE

## 2021-03-04 LAB — RESP PANEL BY RT-PCR (FLU A&B, COVID) ARPGX2
Influenza A by PCR: NEGATIVE
Influenza B by PCR: NEGATIVE
SARS Coronavirus 2 by RT PCR: NEGATIVE

## 2021-03-04 LAB — LACTIC ACID, PLASMA
Lactic Acid, Venous: 1.1 mmol/L (ref 0.5–1.9)
Lactic Acid, Venous: 1.1 mmol/L (ref 0.5–1.9)

## 2021-03-04 LAB — TROPONIN I (HIGH SENSITIVITY)
Troponin I (High Sensitivity): 6 ng/L (ref <18)
Troponin I (High Sensitivity): 7 ng/L (ref <18)

## 2021-03-04 LAB — LIPASE, BLOOD: Lipase: 35 U/L (ref 11–51)

## 2021-03-04 MED ORDER — ACETAMINOPHEN 650 MG RE SUPP: 650.0000 mg | Freq: Four times a day (QID) | RECTAL | Status: DC | PRN

## 2021-03-04 MED ORDER — ALPRAZOLAM 0.5 MG PO TABS
0.5000 mg | ORAL_TABLET | Freq: Every day | ORAL | Status: DC | PRN
  Administered 2021-03-05: 0.5 mg via ORAL
  Filled 2021-03-04: qty 1

## 2021-03-04 MED ORDER — DAPAGLIFLOZIN PROPANEDIOL 10 MG PO TABS
10.0000 mg | ORAL_TABLET | Freq: Every day | ORAL | Status: DC
Start: 2021-03-05 — End: 2021-03-04

## 2021-03-04 MED ORDER — ENOXAPARIN SODIUM 40 MG/0.4ML IJ SOSY
40.0000 mg | PREFILLED_SYRINGE | INTRAMUSCULAR | Status: DC
  Administered 2021-03-04 – 2021-03-05 (×2): 40 mg via SUBCUTANEOUS
  Filled 2021-03-04 (×2): qty 0.4

## 2021-03-04 MED ORDER — MORPHINE SULFATE (PF) 4 MG/ML IV SOLN
4.0000 mg | Freq: Once | INTRAVENOUS | Status: AC
  Administered 2021-03-04: 4 mg via INTRAVENOUS
  Filled 2021-03-04: qty 1

## 2021-03-04 MED ORDER — TIZANIDINE HCL 4 MG PO TABS: 4.0000 mg | ORAL_TABLET | Freq: Every day | ORAL | Status: DC | PRN

## 2021-03-04 MED ORDER — MEXILETINE HCL 200 MG PO CAPS
200.0000 mg | ORAL_CAPSULE | Freq: Two times a day (BID) | ORAL | Status: DC
  Administered 2021-03-04 – 2021-03-06 (×4): 200 mg via ORAL
  Filled 2021-03-04 (×5): qty 1

## 2021-03-04 MED ORDER — SERTRALINE HCL 50 MG PO TABS
50.0000 mg | ORAL_TABLET | Freq: Every day | ORAL | Status: DC
  Administered 2021-03-05 – 2021-03-06 (×2): 50 mg via ORAL
  Filled 2021-03-04 (×2): qty 1

## 2021-03-04 MED ORDER — SODIUM CHLORIDE 0.9% FLUSH
3.0000 mL | Freq: Two times a day (BID) | INTRAVENOUS | Status: DC
  Administered 2021-03-05: 3 mL via INTRAVENOUS

## 2021-03-04 MED ORDER — LACTATED RINGERS IV SOLN: INTRAVENOUS | Status: DC

## 2021-03-04 MED ORDER — ACETAMINOPHEN 325 MG PO TABS
650.0000 mg | ORAL_TABLET | Freq: Four times a day (QID) | ORAL | Status: DC | PRN
  Administered 2021-03-05: 650 mg via ORAL
  Filled 2021-03-04: qty 2

## 2021-03-04 MED ORDER — ALBUTEROL SULFATE (2.5 MG/3ML) 0.083% IN NEBU: 2.5000 mg | INHALATION_SOLUTION | Freq: Four times a day (QID) | RESPIRATORY_TRACT | Status: DC | PRN

## 2021-03-04 MED ORDER — IOHEXOL 350 MG/ML SOLN
100.0000 mL | Freq: Once | INTRAVENOUS | Status: AC | PRN
  Administered 2021-03-04: 100 mL via INTRAVENOUS

## 2021-03-04 MED ORDER — LOPERAMIDE HCL 2 MG PO CAPS
2.0000 mg | ORAL_CAPSULE | ORAL | Status: DC | PRN
  Administered 2021-03-04 – 2021-03-06 (×5): 2 mg via ORAL
  Filled 2021-03-04 (×5): qty 1

## 2021-03-04 MED ORDER — ASPIRIN EC 81 MG PO TBEC
81.0000 mg | DELAYED_RELEASE_TABLET | Freq: Every day | ORAL | Status: DC
  Administered 2021-03-05 – 2021-03-06 (×2): 81 mg via ORAL
  Filled 2021-03-04 (×2): qty 1

## 2021-03-04 MED ORDER — SACUBITRIL-VALSARTAN 97-103 MG PO TABS
1.0000 | ORAL_TABLET | Freq: Two times a day (BID) | ORAL | Status: DC
  Administered 2021-03-05 – 2021-03-06 (×3): 1 via ORAL
  Filled 2021-03-04 (×4): qty 1

## 2021-03-04 MED ORDER — ONDANSETRON HCL 4 MG/2ML IJ SOLN
4.0000 mg | Freq: Once | INTRAMUSCULAR | Status: AC
  Administered 2021-03-04: 4 mg via INTRAVENOUS
  Filled 2021-03-04: qty 2

## 2021-03-04 MED ORDER — CLOPIDOGREL BISULFATE 75 MG PO TABS
75.0000 mg | ORAL_TABLET | Freq: Every day | ORAL | Status: DC
  Administered 2021-03-05 – 2021-03-06 (×2): 75 mg via ORAL
  Filled 2021-03-04 (×2): qty 1

## 2021-03-04 MED ORDER — POTASSIUM CHLORIDE CRYS ER 20 MEQ PO TBCR
40.0000 meq | EXTENDED_RELEASE_TABLET | Freq: Once | ORAL | Status: AC
  Administered 2021-03-04: 40 meq via ORAL
  Filled 2021-03-04: qty 2

## 2021-03-04 MED ORDER — ALLOPURINOL 100 MG PO TABS
200.0000 mg | ORAL_TABLET | Freq: Every day | ORAL | Status: DC
  Administered 2021-03-05 – 2021-03-06 (×2): 200 mg via ORAL
  Filled 2021-03-04 (×2): qty 2

## 2021-03-04 MED ORDER — BUDESONIDE 0.25 MG/2ML IN SUSP
0.2500 mg | Freq: Two times a day (BID) | RESPIRATORY_TRACT | Status: DC
  Filled 2021-03-04 (×2): qty 2

## 2021-03-04 MED ORDER — CARVEDILOL 25 MG PO TABS
25.0000 mg | ORAL_TABLET | Freq: Two times a day (BID) | ORAL | Status: DC
  Administered 2021-03-05 – 2021-03-06 (×3): 25 mg via ORAL
  Filled 2021-03-04 (×3): qty 1

## 2021-03-04 MED ORDER — SODIUM CHLORIDE 0.9 % IV BOLUS
1000.0000 mL | Freq: Once | INTRAVENOUS | Status: AC
  Administered 2021-03-04: 1000 mL via INTRAVENOUS

## 2021-03-04 MED ORDER — EZETIMIBE 10 MG PO TABS
10.0000 mg | ORAL_TABLET | Freq: Every day | ORAL | Status: DC
  Administered 2021-03-05 – 2021-03-06 (×2): 10 mg via ORAL
  Filled 2021-03-04 (×2): qty 1

## 2021-03-04 MED ORDER — SODIUM CHLORIDE 0.9 % IV BOLUS
500.0000 mL | Freq: Once | INTRAVENOUS | Status: AC
  Administered 2021-03-04: 500 mL via INTRAVENOUS

## 2021-03-04 NOTE — ED Notes (Signed)
Triage delayed, pt in restroom 

## 2021-03-04 NOTE — ED Triage Notes (Signed)
Pt arrives pov with abdominal pain, diarrhea x 5 days per spouse. Denies fever, endorses generalized body aches and dizziness. Pt also reports CP that started today, sent by UC today

## 2021-03-04 NOTE — H&P (Signed)
History and Physical   Jason Hogan [id number] DOB: 1975-04-13 DOA: 03/04/2021  PCP: [name], MD   Patient coming from: Home  Chief Complaint: Diarrhea  HPI: Jason Hogan is a 46 y.o. male with medical history significant of sleep disorder, statin induced myopathy, CHF, CAD status post MI, migraines, low back pain, V. tach, hypertension, hyperlipidemia, gout, depression, anxiety, asthma who presents with ongoing diarrhea  Patient states that for the past 5 days he has had ongoing diarrhea with multiple episodes a day that are watery.  States that there is been some color change to his stool it appears whitish at times.  States has been taking Imodium which helps some but does not completely relieve the symptoms.  Reports some nausea without vomiting.  Also some generalized abdominal cramping.  He also reports a single episode of some substernal chest pain.  States he is lost around 10 pounds this week due to this ongoing diarrhea.  He reports that his torsemide and metolazone were discontinued some months ago in the setting of recurrent AKI/dehydration.  He does still take spironolactone and [place]. He denies fevers, chills.  ED Course: Vital signs in the ED significant for blood pressure in the 0000000 to 123XX123 systolic and heart rate in the 80s to 100s.  Lab work-up showed CMP with creatinine 1.34 which is mildly above baseline of 1.1, glucose 114, bicarb 18, potassium 3.6 which is normal range but not at goal for CHF patient, total protein 8.2.  CBC within normal limits.  Troponin normal x2.  Lipase normal.  Lactic acid normal.  Respiratory panel for flu and COVID-negative.  C. difficile panel negative.  GI pathogen panel pending.  Chest x-ray showed no acute abnormality.  CT PE study was negative for PE.  CT of the abdomen pelvis showed fluid levels throughout the colon and rectum which is nonspecific malabsorptive state possibly consistent with colitis.  Patient received morphine,  Zofran, 1.5 L of fluids and was started on a rate of 50 cc an hour in the ED.  Review of Systems: As per HPI otherwise all other systems reviewed and are negative.  Past Medical History:  Diagnosis Date   Acute Myocardial Infarction 08/18/2009   Qualifier: Diagnosis of  By: [name]     Allergic rhinitis, cause unspecified 10/03/2011   Anxiety 10/03/2011   Asthma 09/20/2006   Qualifier: Diagnosis of  By: [name], [name]    Automatic implantable cardiac defibrillator in situ 01/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 Qualifier: Diagnosis of  By: [name], MD, [name]    CAD (coronary artery disease)    a. large anterior MI 2011 s/p PTCA/DES to 100%. b. occluded LAD with occluded collateralized silent RCA, balloon angioplasty to stented segment of LAD and DES x1 to mid circumflex in 10/2017.   CHF (congestive heart failure) ([place])    Cholelithiasis 06/22/2007   Qualifier: Diagnosis of  By: [name]    Chronic systolic heart failure ([place]) 09/18/2009   Qualifier: Diagnosis of  By: [name]     Depressive disorder, not elsewhere classified 06/22/2007   Qualifier: Diagnosis of  By: [name]    History of gout    History of kidney stones    Hyperlipidemia 81/2011   Hypertension    Impaired glucose tolerance 10/05/2012   Insomnia, unspecified 06/22/2007   Qualifier: Diagnosis of  By: [name], [name]    Ischemic cardiomyopathy 10/20/2009   Qualifier: Diagnosis of  ByCaryl Comes, MD, Remus Blake    Low testosterone 10/14/2013   Migraine Headache 09/18/2009   Qualifier: Diagnosis of  By: [name]     Renal calculus    Renal Insufficiency 09/18/2009   Qualifier: Diagnosis of  By: [name]     Respiratory failure ([place]) 08/2009   Rhabdomyolysis 09/18/2009   Qualifier: Diagnosis of  By: [name]     VT (ventricular tachycardia)     Past Surgical History:  Procedure Laterality Date   CARDIAC  CATHETERIZATION N/A 05/17/2015   Procedure: Left Heart Cath and Coronary Angiography;  Surgeon: [name], MD;  Location: [facility];  Service: Cardiovascular;  Laterality: N/A;   CARDIAC CATHETERIZATION  2012   CORONARY ANGIOPLASTY WITH STENT PLACEMENT  2011   CORONARY STENT INTERVENTION N/A 11/14/2017   Procedure: CORONARY STENT INTERVENTION;  Surgeon: Burnell Blanks, MD;  Location: [facility];  Service: Cardiovascular;  Laterality: N/A;   ICD IMPLANT  11/2009   "w/pacing function"   RIGHT/LEFT HEART CATH AND CORONARY ANGIOGRAPHY N/A 11/14/2017   Procedure: RIGHT/LEFT HEART CATH AND CORONARY ANGIOGRAPHY;  Surgeon: Burnell Blanks, MD;  Location: [facility];  Service: Cardiovascular;  Laterality: N/A;    Social History  reports that he quit smoking about 27 years ago. His smoking use included cigarettes. He has a 0.20 pack-year smoking history. He has never used smokeless tobacco. He reports that he does not currently use alcohol. He reports that he does not use drugs.  Allergies  Allergen Reactions   Levaquin [Levofloxacin] Other (See Comments)    Joint pain   Ace Inhibitors Cough   Crestor [Rosuvastatin] Other (See Comments)    "Sweet cravings"   Lunesta [Eszopiclone] Other (See Comments)    "Bitter taste in mouth"   Zocor [Simvastatin] Other (See Comments)    Memory problem    Family History  Problem Relation Age of Onset   Heart attack Father    Heart disease Other    Diabetes Other    Hypothyroidism Other   Reviewed on admission  Prior to Admission medications   Medication Sig Start Date End Date Taking? Authorizing Provider  albuterol (VENTOLIN HFA) 108 (90 Base) MCG/ACT inhaler Inhale 1-2 puffs into the lungs every 6 (six) hours as needed for wheezing or shortness of breath. 02/24/20  Yes [name], MD  allopurinol (ZYLOPRIM) 100 MG tablet TAKE TWO TABLETS BY MOUTH DAILY Patient taking differently: Take 100 mg by mouth 2  (two) times daily. 12/05/20  Yes Lyndal Pulley, DO  ALPRAZolam Duanne Moron) 0.5 MG tablet TAKE ONE TABLET BY MOUTH DAILY AS NEEDED FOR ANXIETY Patient taking differently: Take 0.5 mg by mouth daily as needed for anxiety. 02/13/21  Yes [name], MD  aspirin EC 81 MG tablet Take 81 mg by mouth daily.   Yes [provider]  azelastine (ASTELIN) 0.1 % nasal spray Place 1 spray into both nostrils daily as needed for rhinitis. 08/14/19  Yes [provider]  Budesonide 90 MCG/ACT inhaler Inhale 1 puff into the lungs 2 (two) times daily. Patient taking differently: Inhale 1 puff into the lungs daily as needed (wheezing). 04/21/19 03/04/21 Yes [name], MD  budesonide-formoterol [facility]) 80-4.5 MCG/ACT inhaler Inhale 2 puffs into the lungs daily as needed (wheezing). 07/01/17  Yes [provider]  carvedilol (COREG) 25 MG tablet Take 1 tablet (25 mg total) by mouth 2 (two) times daily. Needs appt Patient taking differently:  Take 25 mg by mouth 2 (two) times daily. 11/17/20  Yes Larey Dresser, MD  clopidogrel (PLAVIX) 75 MG tablet TAKE ONE TABLET BY MOUTH DAILY Patient taking differently: Take 75 mg by mouth daily. 11/29/20  Yes Larey Dresser, MD  dapagliflozin propanediol (FARXIGA) 10 MG TABS tablet Take 1 tablet (10 mg total) by mouth daily before breakfast. 05/16/20  Yes Larey Dresser, MD  Dulaglutide (TRULICITY) 1.5 0000000 SOPN Inject 1.5 mg into the skin once a week. Fridays   Yes [provider]  ezetimibe (ZETIA) 10 MG tablet Take 1 tablet (10 mg total) by mouth daily. 05/24/20  Yes Larey Dresser, MD  KLOR-CON M20 20 MEQ tablet TAKE 2 TABLETS BY MOUTH EVERY DAY Patient taking differently: 20 mEq daily as needed (with torsemide). 10/31/17  Yes Deboraha Sprang, MD  metolazone (ZAROXOLYN) 5 MG tablet TAKE 1 TABLET (5 MG TOTAL) BY MOUTH DAILY AS NEEDED (FOR EDEMA). 10/08/16  Yes [name], MD  mexiletine (MEXITIL) 200 MG capsule TAKE ONE CAPSULE BY  MOUTH TWICE A DAY Patient taking differently: Take 200 mg by mouth 2 (two) times daily. 11/27/20  Yes Larey Dresser, MD  REPATHA SURECLICK [id number] MG/ML SOAJ INJECT ONE PEN UNDER THE SKIN EVERY 14 DAYS Patient taking differently: Inject 140 mg into the skin every 14 (fourteen) days. 12/13/20  Yes [name], MD  sacubitril-valsartan (ENTRESTO) 97-103 MG Take 1 tablet by mouth 2 (two) times daily. NEEDS FOLLOW UP APPOINTMENT FOR FURTHER REFILLS Patient taking differently: Take 1 tablet by mouth 2 (two) times daily. 02/20/21  Yes Larey Dresser, MD  sertraline (ZOLOFT) 50 MG tablet TAKE ONE TABLET BY MOUTH DAILY Patient taking differently: Take 50 mg by mouth daily. 10/17/20  Yes [name], MD  spironolactone (ALDACTONE) 25 MG tablet Take 1 tablet (25 mg total) by mouth at bedtime. NEEDS APPOINTMENT FOR MORE REFILLS Patient taking differently: Take 25 mg by mouth at bedtime. 02/14/21  Yes Larey Dresser, MD  tiZANidine (ZANAFLEX) 4 MG tablet TAKE ONE TABLET BY MOUTH TWICE A DAY AS NEEDED Patient taking differently: Take 4 mg by mouth daily as needed for muscle spasms. 11/17/20  Yes [name], MD  torsemide (DEMADEX) 20 MG tablet Take 1 tablet (20 mg total) by mouth daily as needed. Patient taking differently: Take 20 mg by mouth daily as needed (edema). 05/28/19  Yes Larey Dresser, MD  Cholecalciferol (THERA-D 2000) 50 MCG (2000 UT) TABS 1 tab by mouth once daily Patient not taking: Reported on 03/04/2021 05/02/20   [name], MD  Fluticasone-Salmeterol [facility] INHUB) 250-50 MCG/DOSE AEPB Inhale 1 puff into the lungs 2 (two) times daily. Patient not taking: Reported on 03/04/2021 02/24/20   [name], MD  mupirocin nasal ointment (BACTROBAN) 2 % Place 1 application into the nose 2 (two) times daily. Use one-half of tube in each nostril twice daily for five (5) days. After application, press sides of nose together and gently massage. Patient not taking: Reported on 03/04/2021 05/02/20    [name], MD  predniSONE (DELTASONE) 50 MG tablet Take 1 tablet (50 mg total) by mouth daily. Patient not taking: Reported on 03/04/2021 09/19/20   Gregor Hams, MD    Physical Exam: Vitals:   03/04/21 1419 03/04/21 1500 03/04/21 1600 03/04/21 1744  BP: 94/63 95/66 91/60  96/72  Pulse: 90 83 84 86  Resp: 15 15 18    Temp:    98 F (36.7  C)  TempSrc:    Oral  SpO2: 98% 98% 96% 96%  Weight:      Height:       Physical Exam Constitutional:      General: He is not in acute distress.    Appearance: Normal appearance.  HENT:     Head: Normocephalic and atraumatic.     Mouth/Throat:     Mouth: Mucous membranes are moist.     Pharynx: Oropharynx is clear.  Eyes:     Extraocular Movements: Extraocular movements intact.     Pupils: Pupils are equal, round, and reactive to light.  Cardiovascular:     Rate and Rhythm: Normal rate and regular rhythm.     Pulses: Normal pulses.     Heart sounds: Normal heart sounds.  Pulmonary:     Effort: Pulmonary effort is normal. No respiratory distress.     Breath sounds: Normal breath sounds.  Abdominal:     General: Bowel sounds are normal. There is no distension.     Palpations: Abdomen is soft.     Tenderness: There is no abdominal tenderness (Mild generalized).  Musculoskeletal:        General: No swelling or deformity.  Skin:    General: Skin is warm and dry.  Neurological:     General: No focal deficit present.     Mental Status: Mental status is at baseline.   Labs on Admission: I have personally reviewed following labs and imaging studies  CBC: Recent Labs  Lab 03/04/21 1118  WBC 8.2  HGB 16.8  HCT 54.8*  MCV 73.2*  PLT 123XX123    Basic Metabolic Panel: Recent Labs  Lab 03/04/21 1120  NA 135  K 3.6  CL 106  CO2 18*  GLUCOSE 114*  BUN 17  CREATININE 1.34*  CALCIUM 9.0    GFR: Estimated Creatinine Clearance: 80.9 mL/min (A) (by C-G formula based on SCr of 1.34 mg/dL (H)).  Liver Function Tests: Recent Labs   Lab 03/04/21 1120  AST 14*  ALT 12  ALKPHOS 82  BILITOT 1.0  PROT 8.2*  ALBUMIN 4.3    Urine analysis:    Component Value Date/Time   COLORURINE YELLOW 05/02/2020 1046   APPEARANCEUR CLEAR 05/02/2020 1046   LABSPEC 1.025 05/02/2020 1046   PHURINE 6.0 05/02/2020 1046   GLUCOSEU NEGATIVE 05/02/2020 1046   [place] 05/02/2020 1046   [place] 05/02/2020 1046   [place] 05/02/2020 1046   PROTEINUR 100 (A) 08/21/2009 0412   UROBILINOGEN 0.2 05/02/2020 1046   NITRITE NEGATIVE 05/02/2020 1046   LEUKOCYTESUR NEGATIVE 05/02/2020 1046    Radiological Exams on Admission: DG Chest 2 View  Result Date: 03/04/2021 CLINICAL DATA:  Abdominal pain with diarrhea for 5 days. EXAM: CHEST - 2 VIEW COMPARISON:  09/06/2018. FINDINGS: Cardiac silhouette is normal in size and configuration. Normal mediastinal and hilar contours. Stable left anterior chest wall AICD. Clear lungs.  No pleural effusion or pneumothorax. Skeletal structures are intact. IMPRESSION: No active cardiopulmonary disease. Electronically Signed   By: Lajean Manes M.D.   On: 03/04/2021 12:16   CT Angio Chest PE W and/or Wo Contrast  Result Date: 03/04/2021 CLINICAL DATA:  Epigastric abdominal pain and chest pain. Diarrhea. Pulmonary embolism (PE) suspected, high prob EXAM: CT ANGIOGRAPHY CHEST CT ABDOMEN AND PELVIS WITH CONTRAST TECHNIQUE: Multidetector CT imaging of the chest was performed using the standard protocol during bolus administration of intravenous contrast. Multiplanar CT image reconstructions and MIPs were obtained to evaluate the vascular  anatomy. Multidetector CT imaging of the abdomen and pelvis was performed using the standard protocol during bolus administration of intravenous contrast. RADIATION DOSE REDUCTION: This exam was performed according to the departmental dose-optimization program which includes automated exposure control, adjustment of the mA and/or kV according to patient size  and/or use of iterative reconstruction technique. CONTRAST:  170mL OMNIPAQUE IOHEXOL 350 MG/ML SOLN COMPARISON:  Chest radiograph from earlier today. 07/05/2016 CT abdomen/pelvis. 11/29/2009 chest CT angiogram. FINDINGS: CTA CHEST FINDINGS Cardiovascular: The study is moderate quality for the evaluation of pulmonary embolism, with some motion degradation. There are no filling defects in the central, lobar, segmental or subsegmental pulmonary artery branches to suggest acute pulmonary embolism. Great vessels are normal in course and caliber. Top-normal heart size. Single lead left subclavian ICD with lead tip in the right ventricular apex. No significant pericardial fluid/thickening. Coronary atherosclerosis. Mediastinum/Nodes: No discrete thyroid nodules. Unremarkable esophagus. No pathologically enlarged axillary, mediastinal or hilar lymph nodes. Lungs/Pleura: No pneumothorax. No pleural effusion. No acute consolidative airspace disease or lung masses. Tiny 2 mm anterior right lower lobe solid pulmonary nodule (series 4/image 47), stable and considered benign. No new significant pulmonary nodules. Musculoskeletal: No aggressive appearing focal osseous lesions. Moderate symmetric bilateral gynecomastia. Review of the MIP images confirms the above findings. CT ABDOMEN and PELVIS FINDINGS Hepatobiliary: Normal liver with no liver mass. Normal gallbladder with no radiopaque cholelithiasis. No biliary ductal dilatation. Pancreas: Normal, with no mass or duct dilation. Spleen: Normal size. No mass. Adrenals/Urinary Tract: Normal adrenals. Subcentimeter hypodense medial upper left renal cortical lesion is too small to characterize and requires no follow-up. Otherwise normal kidneys, with no hydronephrosis. Normal bladder. Stomach/Bowel: Normal non-distended stomach. Normal caliber small bowel with no small bowel wall thickening. Normal appendix. Prominent fluid levels throughout the colon and rectum. No large bowel wall  thickening, diverticulosis or significant pericolonic fat stranding. Vascular/Lymphatic: Atherosclerotic nonaneurysmal abdominal aorta. Patent portal, splenic, hepatic and renal veins. No pathologically enlarged lymph nodes in the abdomen or pelvis. Reproductive: Normal size prostate. Nonspecific internal prostatic calcifications. Other: No pneumoperitoneum, ascites or focal fluid collection. Musculoskeletal: No aggressive appearing focal osseous lesions. Marked degenerative disc disease at L5-S1. Review of the MIP images confirms the above findings. IMPRESSION: 1. No evidence of pulmonary embolism.  No active pulmonary disease. 2. Prominent fluid levels throughout the colon and rectum, indicating a nonspecific malaborptive state as can be seen with colitis. No evidence of bowel obstruction. No bowel wall thickening. 3. Coronary atherosclerosis. 4. Moderate symmetric bilateral gynecomastia. 5. Aortic Atherosclerosis (ICD10-I70.0). Electronically Signed   By: Ilona Sorrel M.D.   On: 03/04/2021 13:51   CT ABDOMEN PELVIS W CONTRAST  Result Date: 03/04/2021 CLINICAL DATA:  Epigastric abdominal pain and chest pain. Diarrhea. Pulmonary embolism (PE) suspected, high prob EXAM: CT ANGIOGRAPHY CHEST CT ABDOMEN AND PELVIS WITH CONTRAST TECHNIQUE: Multidetector CT imaging of the chest was performed using the standard protocol during bolus administration of intravenous contrast. Multiplanar CT image reconstructions and MIPs were obtained to evaluate the vascular anatomy. Multidetector CT imaging of the abdomen and pelvis was performed using the standard protocol during bolus administration of intravenous contrast. RADIATION DOSE REDUCTION: This exam was performed according to the departmental dose-optimization program which includes automated exposure control, adjustment of the mA and/or kV according to patient size and/or use of iterative reconstruction technique. CONTRAST:  164mL OMNIPAQUE IOHEXOL 350 MG/ML SOLN  COMPARISON:  Chest radiograph from earlier today. 07/05/2016 CT abdomen/pelvis. 11/29/2009 chest CT angiogram. FINDINGS: CTA CHEST FINDINGS Cardiovascular: The study  is moderate quality for the evaluation of pulmonary embolism, with some motion degradation. There are no filling defects in the central, lobar, segmental or subsegmental pulmonary artery branches to suggest acute pulmonary embolism. Great vessels are normal in course and caliber. Top-normal heart size. Single lead left subclavian ICD with lead tip in the right ventricular apex. No significant pericardial fluid/thickening. Coronary atherosclerosis. Mediastinum/Nodes: No discrete thyroid nodules. Unremarkable esophagus. No pathologically enlarged axillary, mediastinal or hilar lymph nodes. Lungs/Pleura: No pneumothorax. No pleural effusion. No acute consolidative airspace disease or lung masses. Tiny 2 mm anterior right lower lobe solid pulmonary nodule (series 4/image 47), stable and considered benign. No new significant pulmonary nodules. Musculoskeletal: No aggressive appearing focal osseous lesions. Moderate symmetric bilateral gynecomastia. Review of the MIP images confirms the above findings. CT ABDOMEN and PELVIS FINDINGS Hepatobiliary: Normal liver with no liver mass. Normal gallbladder with no radiopaque cholelithiasis. No biliary ductal dilatation. Pancreas: Normal, with no mass or duct dilation. Spleen: Normal size. No mass. Adrenals/Urinary Tract: Normal adrenals. Subcentimeter hypodense medial upper left renal cortical lesion is too small to characterize and requires no follow-up. Otherwise normal kidneys, with no hydronephrosis. Normal bladder. Stomach/Bowel: Normal non-distended stomach. Normal caliber small bowel with no small bowel wall thickening. Normal appendix. Prominent fluid levels throughout the colon and rectum. No large bowel wall thickening, diverticulosis or significant pericolonic fat stranding. Vascular/Lymphatic:  Atherosclerotic nonaneurysmal abdominal aorta. Patent portal, splenic, hepatic and renal veins. No pathologically enlarged lymph nodes in the abdomen or pelvis. Reproductive: Normal size prostate. Nonspecific internal prostatic calcifications. Other: No pneumoperitoneum, ascites or focal fluid collection. Musculoskeletal: No aggressive appearing focal osseous lesions. Marked degenerative disc disease at L5-S1. Review of the MIP images confirms the above findings. IMPRESSION: 1. No evidence of pulmonary embolism.  No active pulmonary disease. 2. Prominent fluid levels throughout the colon and rectum, indicating a nonspecific malaborptive state as can be seen with colitis. No evidence of bowel obstruction. No bowel wall thickening. 3. Coronary atherosclerosis. 4. Moderate symmetric bilateral gynecomastia. 5. Aortic Atherosclerosis (ICD10-I70.0). Electronically Signed   By: Ilona Sorrel M.D.   On: 03/04/2021 13:51    EKG: Independently reviewed.  Sinus tachycardia at 120 bpm.  Q waves in in anterior leads similar to previous, but rate increased compared to previous.    Assessment/Plan Principal Problem:   Intractable diarrhea Active Problems:   Hyperlipidemia   Depression   Chronic systolic heart failure (HCC)   Asthma   Implantable cardioverter-defibrillator (ICD) in situ   Anxiety   Gout   CAD (coronary artery disease), native coronary artery   Ischemic cardiomyopathy s/p AICD   Hypertension  Intractable diarrhea ?Colitis > Presenting with 5 days of multiple episodes of watery diarrhea.  Leading to weight loss and borderline hypotension.  Only mildly relieved by Imodium at home. > No blood, no leukocytosis in the ED, minimal creatinine elevation that does not meet criteria for AKI. > CTA does demonstrate fluid levels that could be consistent with colitis. > Negative C. difficile panel in the ED.  Did receive a liter and a half of fluids in the ED as well. - Follow-up GI pathogen panel -  Supportive care - Imodium as needed - Gentle fluids considering cardiac history  CAD CHF Hypertension > Known history of CAD status post MI in 2011 last heart cath was in 2019 with chronically occluded RCA, 70% occluded LAD which underwent balloon angioplasty and 99% occluded proximal left circumflex which was stented at that time.  Collaterals present for vascularization  of distal RCA vessels. > Last echo was in July of this year with EF stable at [id number], grade 2 diastolic dysfunction, normal RV function.  Is status post AICD - Given his significant cardiac history will maintain patient on telemetry for now - Noted to have low normal potassium at 3.6 will give 40 mEq p.o. for goal potassium of 4 - Check magnesium - Holding diuretics including torsemide, spironolactone, metolazone for now. We will hold Farxiga as well. - Continue home Entresto and carvedilol in the morning - Continue home Zetia - Continue home aspirin and Plavix  History of V. Tach Palpitations > Does have AICD in place - Continue home mexiletine and carvedilol - Monitor on telemetry  Hyperlipidemia Statin intolerance > Is on Zetia and Repatha outpatient - Continue home Zetia  Gout - Continue allopurinol  Depression Anxiety - Continue home Xanax and Zoloft  Asthma - Continue home budesonide and as needed albuterol  DVT prophylaxis: Lovenox  Code Status:   Full  Family Communication:  None on admission.  He states his wife knows he is here, is up-to-date and is actually going to be coming by to see him shortly.  Disposition Plan:   Patient is from:  Home  Anticipated DC to:  Home  Anticipated DC date:  1 to 2 days  Anticipated DC barriers: None  Consults called:  None  Admission status:  Observation, telemetry  Severity of Illness: The appropriate patient status for this patient is OBSERVATION. Observation status is judged to be reasonable and necessary in order to provide the required intensity of  service to ensure the patient's safety. The patient's presenting symptoms, physical exam findings, and initial radiographic and laboratory data in the context of their medical condition is felt to place them at decreased risk for further clinical deterioration. Furthermore, it is anticipated that the patient will be medically stable for discharge from the hospital within 2 midnights of admission.    Marcelyn Bruins MD [facility]  How to contact the [facility] Attending or Consulting provider [place] or covering provider during after hours [place], for this patient?   Check the care team in [facility] and look for a) attending/consulting [facility] provider listed and b) the [facility] team listed Log into [email or web address] and use [place]'s universal password to access. If you do not have the password, please contact the hospital operator. Locate the [facility] provider you are looking for under [facility] and page to a number that you can be directly reached. If you still have difficulty reaching the provider, please page the [facility] (Director on Call) for the Hospitalists listed on amion for assistance.  03/04/2021, 6:53 PM

## 2021-03-04 NOTE — ED Notes (Signed)
Pt lying flat for orthostatic VS 

## 2021-03-04 NOTE — ED Provider Notes (Signed)
[facility] EMERGENCY DEPARTMENT Provider Note   CSN: [id number] Arrival date & time: 03/04/21  1052    History  Chief Complaint  Patient presents with   Chest Pain    Jason Hogan is a 46 y.o. male since of cardiac history here for evaluation of multiple complaints.  Over the last 5 days has had multiple episodes (>20) of pure water diarrhea.  Takes Imodium, helps however has not resolved.  Some nausea without emesis.  Has some generalized abdominal cramping.  States his stool has "turned colors like white."  No recent antibiotics or travel.  No sick contacts.  Noted today he developed some substernal chest pain. Some reproducible when pressing on left chest. However some feels "deep." No cough.  Some mild shortness of breath.  No ICD firing.  No numbness.  Feels generally weak, lost 10 pounds over the last week to persistent diarrhea. NO LE swelling, hx of PE, DVT.  HPI     Home Medications Prior to Admission medications   Medication Sig Start Date End Date Taking? Authorizing Provider  albuterol (VENTOLIN HFA) 108 (90 Base) MCG/ACT inhaler Inhale 1-2 puffs into the lungs every 6 (six) hours as needed for wheezing or shortness of breath. 02/24/20   [name], MD  allopurinol (ZYLOPRIM) 100 MG tablet TAKE TWO TABLETS BY MOUTH DAILY 12/05/20   Lyndal Pulley, DO  ALPRAZolam Duanne Moron) 0.5 MG tablet TAKE ONE TABLET BY MOUTH DAILY AS NEEDED FOR ANXIETY 02/13/21   [name], MD  aspirin EC 81 MG tablet Take 81 mg by mouth daily.    [provider]  azelastine (ASTELIN) 0.1 % nasal spray Place into both nostrils. 08/14/19   [provider]  Budesonide 90 MCG/ACT inhaler Inhale 1 puff into the lungs 2 (two) times daily. 04/21/19 11/30/20  [name], MD  carvedilol (COREG) 25 MG tablet Take 1 tablet (25 mg total) by mouth 2 (two) times daily. Needs appt 11/17/20   Larey Dresser, MD  Cholecalciferol (THERA-D 2000) 50 MCG (2000 UT) TABS 1 tab by mouth once  daily 05/02/20   [name], MD  clopidogrel (PLAVIX) 75 MG tablet TAKE ONE TABLET BY MOUTH DAILY 11/29/20   Larey Dresser, MD  Coenzyme Q10 (CO Q 10) 100 MG CAPS Take 100 mg by mouth daily.     [provider]  dapagliflozin propanediol (FARXIGA) 10 MG TABS tablet Take 1 tablet (10 mg total) by mouth daily before breakfast. 05/16/20   Larey Dresser, MD  ezetimibe (ZETIA) 10 MG tablet Take 1 tablet (10 mg total) by mouth daily. 05/24/20   Larey Dresser, MD  Fluticasone-Salmeterol [facility] INHUB) 250-50 MCG/DOSE AEPB Inhale 1 puff into the lungs 2 (two) times daily. 02/24/20   [name], MD  KLOR-CON M20 20 MEQ tablet TAKE 2 TABLETS BY MOUTH EVERY DAY 10/31/17   Deboraha Sprang, MD  metolazone (ZAROXOLYN) 5 MG tablet TAKE 1 TABLET (5 MG TOTAL) BY MOUTH DAILY AS NEEDED (FOR EDEMA). 10/08/16   [name], MD  mexiletine (MEXITIL) 200 MG capsule TAKE ONE CAPSULE BY MOUTH TWICE A DAY 11/27/20   Larey Dresser, MD  mupirocin nasal ointment (BACTROBAN) 2 % Place 1 application into the nose 2 (two) times daily. Use one-half of tube in each nostril twice daily for five (5) days. After application, press sides of nose together and gently massage. 05/02/20   [name], MD  predniSONE ([place]) 50  MG tablet Take 1 tablet (50 mg total) by mouth daily. 09/19/20   Gregor Hams, MD  REPATHA SURECLICK [id number] MG/ML SOAJ INJECT ONE PEN UNDER THE SKIN EVERY 14 DAYS 12/13/20   [name], MD  sacubitril-valsartan (ENTRESTO) 97-103 MG Take 1 tablet by mouth 2 (two) times daily. NEEDS FOLLOW UP APPOINTMENT FOR FURTHER REFILLS 02/20/21   Larey Dresser, MD  sertraline (ZOLOFT) 50 MG tablet TAKE ONE TABLET BY MOUTH DAILY 10/17/20   [name], MD  spironolactone (ALDACTONE) 25 MG tablet Take 1 tablet (25 mg total) by mouth at bedtime. NEEDS APPOINTMENT FOR MORE REFILLS 02/14/21   Larey Dresser, MD  tiZANidine (ZANAFLEX) 4 MG tablet TAKE ONE TABLET BY MOUTH TWICE A DAY AS NEEDED 11/17/20    [name], MD  torsemide (DEMADEX) 20 MG tablet Take 1 tablet (20 mg total) by mouth daily as needed. 05/28/19   Larey Dresser, MD      Allergies    Levaquin [levofloxacin], Ace inhibitors, Crestor [rosuvastatin], Lunesta [eszopiclone], and Zocor [simvastatin]    Review of Systems   Review of Systems  Constitutional:  Positive for activity change, appetite change and fatigue.  HENT: Negative.    Respiratory:  Positive for shortness of breath.   Cardiovascular:  Positive for chest pain. Negative for palpitations and leg swelling.  Gastrointestinal:  Positive for abdominal pain, diarrhea, nausea and vomiting. Negative for abdominal distention, anal bleeding, blood in stool, constipation and rectal pain.  Genitourinary: Negative.   Musculoskeletal:  Positive for myalgias.  Skin: Negative.   Neurological:  Positive for weakness (generalized).  All other systems reviewed and are negative.  Physical Exam Updated Vital Signs BP 95/66    Pulse 83    Temp 98.1 F (36.7 C) (Oral)    Resp 15    Ht 6\' 2"  (1.88 m)    Wt 90.3 kg    SpO2 98%    BMI 25.55 kg/m  Physical Exam Vitals and nursing note reviewed.  Constitutional:      General: He is not in acute distress.    Appearance: He is well-developed. He is not ill-appearing, toxic-appearing or diaphoretic.  HENT:     Head: Normocephalic and atraumatic.  Eyes:     Pupils: Pupils are equal, round, and reactive to light.  Cardiovascular:     Rate and Rhythm: Regular rhythm. Tachycardia present.     Pulses:          Radial pulses are 2+ on the right side and 2+ on the left side.       Dorsalis pedis pulses are 2+ on the right side and 2+ on the left side.     Heart sounds: Normal heart sounds.  Pulmonary:     Effort: Pulmonary effort is normal. No respiratory distress.     Breath sounds: Normal breath sounds.     Comments: Clear bil, speaks in full sentences without difficulty Chest:     Comments: Diffuse tenderness to left chest  wall Abdominal:     General: There is no distension.     Palpations: Abdomen is soft. There is no hepatomegaly or mass.     Tenderness: There is abdominal tenderness. There is no guarding or rebound.     Comments: Soft, generalized tenderness  Musculoskeletal:        General: Normal range of motion.     Cervical back: Normal range of motion and neck supple.     Right lower leg: No  tenderness. No edema.     Left lower leg: No tenderness. No edema.     Comments: Full ROM, non tender, compartments soft, no calf tenderness  Skin:    General: Skin is warm and dry.     Capillary Refill: Capillary refill takes less than 2 seconds.     Comments: No edema, erythema, warmth  Neurological:     General: No focal deficit present.     Mental Status: He is alert and oriented to person, place, and time.    ED Results / Procedures / Treatments   Labs (all labs ordered are listed, but only abnormal results are displayed) Labs Reviewed  CBC - Abnormal; Notable for the following components:      Result Value   RBC 7.49 (*)    HCT 54.8 (*)    MCV 73.2 (*)    MCH 22.4 (*)    RDW 20.6 (*)    All other components within normal limits  COMPREHENSIVE METABOLIC PANEL - Abnormal; Notable for the following components:   CO2 18 (*)    Glucose, Bld 114 (*)    Creatinine, Ser 1.34 (*)    Total Protein 8.2 (*)    AST 14 (*)    All other components within normal limits  RESP PANEL BY RT-PCR (FLU A&B, COVID) ARPGX2  C DIFFICILE QUICK SCREEN W PCR REFLEX    GASTROINTESTINAL PANEL BY PCR, STOOL (REPLACES STOOL CULTURE)  CULTURE, BLOOD (SINGLE)  LIPASE, BLOOD  LACTIC ACID, PLASMA  LACTIC ACID, PLASMA  TROPONIN I (HIGH SENSITIVITY)  TROPONIN I (HIGH SENSITIVITY)    EKG EKG Interpretation  Date/Time:  Sunday March 04 2021 11:04:14 EST Ventricular Rate:  120 PR Interval:  170 QRS Duration: 96 QT Interval:  320 QTC Calculation: 452 R Axis:   82 Text Interpretation: Sinus tachycardia Right atrial  enlargement Anterolateral infarct , age undetermined Abnormal ECG When compared with ECG of 16-May-2020 10:25, PREVIOUS ECG IS PRESENT SINCE LAST TRACING HEART RATE HAS INCREASED Confirmed by Malvin Johns [phone]) on 03/04/2021 12:24:07 PM  Radiology DG Chest 2 View  Result Date: 03/04/2021 CLINICAL DATA:  Abdominal pain with diarrhea for 5 days. EXAM: CHEST - 2 VIEW COMPARISON:  09/06/2018. FINDINGS: Cardiac silhouette is normal in size and configuration. Normal mediastinal and hilar contours. Stable left anterior chest wall AICD. Clear lungs.  No pleural effusion or pneumothorax. Skeletal structures are intact. IMPRESSION: No active cardiopulmonary disease. Electronically Signed   By: Lajean Manes M.D.   On: 03/04/2021 12:16   CT Angio Chest PE W and/or Wo Contrast  Result Date: 03/04/2021 CLINICAL DATA:  Epigastric abdominal pain and chest pain. Diarrhea. Pulmonary embolism (PE) suspected, high prob EXAM: CT ANGIOGRAPHY CHEST CT ABDOMEN AND PELVIS WITH CONTRAST TECHNIQUE: Multidetector CT imaging of the chest was performed using the standard protocol during bolus administration of intravenous contrast. Multiplanar CT image reconstructions and MIPs were obtained to evaluate the vascular anatomy. Multidetector CT imaging of the abdomen and pelvis was performed using the standard protocol during bolus administration of intravenous contrast. RADIATION DOSE REDUCTION: This exam was performed according to the departmental dose-optimization program which includes automated exposure control, adjustment of the mA and/or kV according to patient size and/or use of iterative reconstruction technique. CONTRAST:  119mL OMNIPAQUE IOHEXOL 350 MG/ML SOLN COMPARISON:  Chest radiograph from earlier today. 07/05/2016 CT abdomen/pelvis. 11/29/2009 chest CT angiogram. FINDINGS: CTA CHEST FINDINGS Cardiovascular: The study is moderate quality for the evaluation of pulmonary embolism, with some motion degradation. There  are no  filling defects in the central, lobar, segmental or subsegmental pulmonary artery branches to suggest acute pulmonary embolism. Great vessels are normal in course and caliber. Top-normal heart size. Single lead left subclavian ICD with lead tip in the right ventricular apex. No significant pericardial fluid/thickening. Coronary atherosclerosis. Mediastinum/Nodes: No discrete thyroid nodules. Unremarkable esophagus. No pathologically enlarged axillary, mediastinal or hilar lymph nodes. Lungs/Pleura: No pneumothorax. No pleural effusion. No acute consolidative airspace disease or lung masses. Tiny 2 mm anterior right lower lobe solid pulmonary nodule (series 4/image 47), stable and considered benign. No new significant pulmonary nodules. Musculoskeletal: No aggressive appearing focal osseous lesions. Moderate symmetric bilateral gynecomastia. Review of the MIP images confirms the above findings. CT ABDOMEN and PELVIS FINDINGS Hepatobiliary: Normal liver with no liver mass. Normal gallbladder with no radiopaque cholelithiasis. No biliary ductal dilatation. Pancreas: Normal, with no mass or duct dilation. Spleen: Normal size. No mass. Adrenals/Urinary Tract: Normal adrenals. Subcentimeter hypodense medial upper left renal cortical lesion is too small to characterize and requires no follow-up. Otherwise normal kidneys, with no hydronephrosis. Normal bladder. Stomach/Bowel: Normal non-distended stomach. Normal caliber small bowel with no small bowel wall thickening. Normal appendix. Prominent fluid levels throughout the colon and rectum. No large bowel wall thickening, diverticulosis or significant pericolonic fat stranding. Vascular/Lymphatic: Atherosclerotic nonaneurysmal abdominal aorta. Patent portal, splenic, hepatic and renal veins. No pathologically enlarged lymph nodes in the abdomen or pelvis. Reproductive: Normal size prostate. Nonspecific internal prostatic calcifications. Other: No pneumoperitoneum, ascites or  focal fluid collection. Musculoskeletal: No aggressive appearing focal osseous lesions. Marked degenerative disc disease at L5-S1. Review of the MIP images confirms the above findings. IMPRESSION: 1. No evidence of pulmonary embolism.  No active pulmonary disease. 2. Prominent fluid levels throughout the colon and rectum, indicating a nonspecific malaborptive state as can be seen with colitis. No evidence of bowel obstruction. No bowel wall thickening. 3. Coronary atherosclerosis. 4. Moderate symmetric bilateral gynecomastia. 5. Aortic Atherosclerosis (ICD10-I70.0). Electronically Signed   By: Ilona Sorrel M.D.   On: 03/04/2021 13:51   CT ABDOMEN PELVIS W CONTRAST  Result Date: 03/04/2021 CLINICAL DATA:  Epigastric abdominal pain and chest pain. Diarrhea. Pulmonary embolism (PE) suspected, high prob EXAM: CT ANGIOGRAPHY CHEST CT ABDOMEN AND PELVIS WITH CONTRAST TECHNIQUE: Multidetector CT imaging of the chest was performed using the standard protocol during bolus administration of intravenous contrast. Multiplanar CT image reconstructions and MIPs were obtained to evaluate the vascular anatomy. Multidetector CT imaging of the abdomen and pelvis was performed using the standard protocol during bolus administration of intravenous contrast. RADIATION DOSE REDUCTION: This exam was performed according to the departmental dose-optimization program which includes automated exposure control, adjustment of the mA and/or kV according to patient size and/or use of iterative reconstruction technique. CONTRAST:  157mL OMNIPAQUE IOHEXOL 350 MG/ML SOLN COMPARISON:  Chest radiograph from earlier today. 07/05/2016 CT abdomen/pelvis. 11/29/2009 chest CT angiogram. FINDINGS: CTA CHEST FINDINGS Cardiovascular: The study is moderate quality for the evaluation of pulmonary embolism, with some motion degradation. There are no filling defects in the central, lobar, segmental or subsegmental pulmonary artery branches to suggest acute  pulmonary embolism. Great vessels are normal in course and caliber. Top-normal heart size. Single lead left subclavian ICD with lead tip in the right ventricular apex. No significant pericardial fluid/thickening. Coronary atherosclerosis. Mediastinum/Nodes: No discrete thyroid nodules. Unremarkable esophagus. No pathologically enlarged axillary, mediastinal or hilar lymph nodes. Lungs/Pleura: No pneumothorax. No pleural effusion. No acute consolidative airspace disease or lung masses. Tiny 2 mm anterior right  lower lobe solid pulmonary nodule (series 4/image 47), stable and considered benign. No new significant pulmonary nodules. Musculoskeletal: No aggressive appearing focal osseous lesions. Moderate symmetric bilateral gynecomastia. Review of the MIP images confirms the above findings. CT ABDOMEN and PELVIS FINDINGS Hepatobiliary: Normal liver with no liver mass. Normal gallbladder with no radiopaque cholelithiasis. No biliary ductal dilatation. Pancreas: Normal, with no mass or duct dilation. Spleen: Normal size. No mass. Adrenals/Urinary Tract: Normal adrenals. Subcentimeter hypodense medial upper left renal cortical lesion is too small to characterize and requires no follow-up. Otherwise normal kidneys, with no hydronephrosis. Normal bladder. Stomach/Bowel: Normal non-distended stomach. Normal caliber small bowel with no small bowel wall thickening. Normal appendix. Prominent fluid levels throughout the colon and rectum. No large bowel wall thickening, diverticulosis or significant pericolonic fat stranding. Vascular/Lymphatic: Atherosclerotic nonaneurysmal abdominal aorta. Patent portal, splenic, hepatic and renal veins. No pathologically enlarged lymph nodes in the abdomen or pelvis. Reproductive: Normal size prostate. Nonspecific internal prostatic calcifications. Other: No pneumoperitoneum, ascites or focal fluid collection. Musculoskeletal: No aggressive appearing focal osseous lesions. Marked degenerative  disc disease at L5-S1. Review of the MIP images confirms the above findings. IMPRESSION: 1. No evidence of pulmonary embolism.  No active pulmonary disease. 2. Prominent fluid levels throughout the colon and rectum, indicating a nonspecific malaborptive state as can be seen with colitis. No evidence of bowel obstruction. No bowel wall thickening. 3. Coronary atherosclerosis. 4. Moderate symmetric bilateral gynecomastia. 5. Aortic Atherosclerosis (ICD10-I70.0). Electronically Signed   By: Ilona Sorrel M.D.   On: 03/04/2021 13:51    Procedures .Critical Care Performed by: Nettie Elm, PA-C Authorized by: Nettie Elm, PA-C   Critical care provider statement:    Critical care time (minutes):  31   Critical care was necessary to treat or prevent imminent or life-threatening deterioration of the following conditions:  Circulatory failure and dehydration   Critical care was time spent personally by me on the following activities:  Development of treatment plan with patient or surrogate, discussions with consultants, evaluation of patient's response to treatment, examination of patient, ordering and review of laboratory studies, ordering and review of radiographic studies, ordering and performing treatments and interventions, pulse oximetry, re-evaluation of patient's condition and review of old charts    Medications Ordered in ED Medications  lactated ringers infusion (has no administration in time range)  ondansetron (ZOFRAN) injection 4 mg (4 mg Intravenous Given 03/04/21 1230)  sodium chloride 0.9 % bolus 1,000 mL ( Intravenous Stopped 03/04/21 1432)  morphine 4 MG/ML injection 4 mg (4 mg Intravenous Given 03/04/21 1233)  iohexol (OMNIPAQUE) 350 MG/ML injection 100 mL (100 mLs Intravenous Contrast Given 03/04/21 1306)  sodium chloride 0.9 % bolus 500 mL (500 mLs Intravenous New Bag/Given 03/04/21 1436)   ED Course/ Medical Decision Making/ A&P    46 year old here for evaluation multiple  complaints.  He is afebrile, nonseptic appearing.  5 days of generalized body aches, abdominal pain, copious watery stools which have now changed into a pale color.  No melena, blood per rectum.  Apparently has lost 10 pounds due to persistent diarrhea.  No recent travel, antibiotics, sick contacts in the house.  Also noted today some left/substernal chest pain.  He has extensive cardiac history, ICD in place.  Does have some tenderness to left chest wall.  We will plan on labs, imaging and reassess  Labs and imaging personally reviewed and interpreted:  CBC without leukocytosis CMP creatinine 1.34 slightly up from baseline 1.18 Lipase 35 Trop 6, 7  EKG without ischemic changes CTA chest without acute abnormality CT AP possible colitis, low suspicion for ischemic colitis, will hold on antibiotics given no leukocytosis, pending GI studies COVID/Flu neg  Patient reassessed.  Patient does feel mildly better he is still having multiple episodes of watery diarrhea here in the emergency department.  Blood pressures remain soft, 90 systolic despite 1 L IV fluids.  Give additional 500 cc however do not want to fluid load patient given recent echo per chart review showed EF of 30%.  Patient blood pressure falls to the [id number] systolic when he goes from sitting to standing despite multiple IVF bolus.  Suspect due to GI losses.  Will obtain lactate, blood cultures however I suspect due to viral illness over sepsis, bacterial infectious process.  At this time I recommended admission for continued observation, follow-up on stool cultures as well as continued IV fluids.  Patient and family agreeable.  Low suspicion for acute ACS, PE, dissection, bacterial infectious cause as cause of his earlier chest pain.  CONSULT with Dr. Avon Gully with [facility] who is agreeable to accept patient in transfer                           Medical Decision Making Amount and/or Complexity of Data Reviewed Independent Historian:  spouse External Data Reviewed: labs, radiology, ECG and notes.    Details: ICD interrogated from [facility]. He appear dehydrated. No arrythemias, ICD fire Labs: ordered. Decision-making details documented in ED Course. Radiology: ordered and independent interpretation performed. Decision-making details documented in ED Course. ECG/medicine tests: ordered and independent interpretation performed. Decision-making details documented in ED Course.  Risk OTC drugs. Prescription drug management. Parenteral controlled substances.         Final Clinical Impression(s) / ED Diagnoses Final diagnoses:  Colitis  Hypotension, unspecified hypotension type  History of CHF (congestive heart failure)  Diarrhea, unspecified type    Rx / DC Orders ED Discharge Orders     None         Kemontae Dunklee A, PA-C 03/04/21 1527    Malvin Johns, MD 03/05/21 [phone]

## 2021-03-05 ENCOUNTER — Ambulatory Visit (INDEPENDENT_AMBULATORY_CARE_PROVIDER_SITE_OTHER): Payer: 59

## 2021-03-05 DIAGNOSIS — I5022 Chronic systolic (congestive) heart failure: Secondary | ICD-10-CM

## 2021-03-05 DIAGNOSIS — N179 Acute kidney failure, unspecified: Secondary | ICD-10-CM | POA: Diagnosis present

## 2021-03-05 DIAGNOSIS — I255 Ischemic cardiomyopathy: Secondary | ICD-10-CM

## 2021-03-05 LAB — GASTROINTESTINAL PANEL BY PCR, STOOL (REPLACES STOOL CULTURE)

## 2021-03-05 LAB — CBC
HCT: 47.8 % (ref 39.0–52.0)
Hemoglobin: 13.8 g/dL (ref 13.0–17.0)
MCH: 22.3 pg — ABNORMAL LOW (ref 26.0–34.0)
MCHC: 28.9 g/dL — ABNORMAL LOW (ref 30.0–36.0)
MCV: 77.1 fL — ABNORMAL LOW (ref 80.0–100.0)
Platelets: 220 10*3/uL (ref 150–400)
RBC: 6.2 MIL/uL — ABNORMAL HIGH (ref 4.22–5.81)
RDW: 20.1 % — ABNORMAL HIGH (ref 11.5–15.5)
WBC: 7 10*3/uL (ref 4.0–10.5)
nRBC: 0 % (ref 0.0–0.2)

## 2021-03-05 LAB — COMPREHENSIVE METABOLIC PANEL
ALT: 10 U/L (ref 0–44)
AST: 10 U/L — ABNORMAL LOW (ref 15–41)
Albumin: 3.3 g/dL — ABNORMAL LOW (ref 3.5–5.0)
Alkaline Phosphatase: 60 U/L (ref 38–126)
Anion gap: 7 (ref 5–15)
BUN: 15 mg/dL (ref 6–20)
CO2: 19 mmol/L — ABNORMAL LOW (ref 22–32)
Calcium: 8.2 mg/dL — ABNORMAL LOW (ref 8.9–10.3)
Chloride: 110 mmol/L (ref 98–111)
Creatinine, Ser: 1.41 mg/dL — ABNORMAL HIGH (ref 0.61–1.24)
GFR, Estimated: >60 mL/min (ref >60)
Glucose, Bld: 96 mg/dL (ref 70–99)
Potassium: 3.5 mmol/L (ref 3.5–5.1)
Sodium: 136 mmol/L (ref 135–145)
Total Bilirubin: 0.6 mg/dL (ref 0.3–1.2)
Total Protein: 6.1 g/dL — ABNORMAL LOW (ref 6.5–8.1)

## 2021-03-05 LAB — DIFFERENTIAL
Abs Immature Granulocytes: 0.02 10*3/uL (ref 0.00–0.07)
Basophils Absolute: 0.1 10*3/uL (ref 0.0–0.1)
Basophils Relative: 1 %
Eosinophils Absolute: 0.4 10*3/uL (ref 0.0–0.5)
Eosinophils Relative: 6 %
Immature Granulocytes: 0 %
Lymphocytes Relative: 29 %
Lymphs Abs: 2.1 10*3/uL (ref 0.7–4.0)
Monocytes Absolute: 1.3 10*3/uL — ABNORMAL HIGH (ref 0.1–1.0)
Monocytes Relative: 19 %
Neutro Abs: 3.2 10*3/uL (ref 1.7–7.7)
Neutrophils Relative %: 45 %

## 2021-03-05 LAB — HIV ANTIBODY (ROUTINE TESTING W REFLEX): HIV Screen 4th Generation wRfx: NONREACTIVE

## 2021-03-05 LAB — MAGNESIUM: Magnesium: 1.7 mg/dL (ref 1.7–2.4)

## 2021-03-05 MED ORDER — MORPHINE SULFATE (PF) 2 MG/ML IV SOLN
1.0000 mg | INTRAVENOUS | Status: DC | PRN
  Administered 2021-03-05: 1 mg via INTRAVENOUS
  Filled 2021-03-05: qty 1

## 2021-03-05 NOTE — Progress Notes (Signed)
PROGRESS NOTE    Jason Hogan  [id number] DOB: July 30, 1975 DOA: 03/04/2021 PCP: [name], MD    Brief Narrative:  46 y.o. male with medical history significant of sleep disorder, statin induced myopathy, CHF, CAD status post MI, migraines, low back pain, V. tach, hypertension, hyperlipidemia, gout, depression, anxiety, asthma who presents with ongoing diarrhea x 5 days prior to visit associated with 10lb wt loss.  Assessment & Plan:   Principal Problem:   Intractable diarrhea Active Problems:   Hyperlipidemia   Depression   Chronic systolic heart failure (HCC)   Asthma   Implantable cardioverter-defibrillator (ICD) in situ   Anxiety   Gout   CAD (coronary artery disease), native coronary artery   Ischemic cardiomyopathy s/p AICD   Hypertension  Intractable diarrhea ?Colitis > Presenting with 5 days of multiple episodes of watery diarrhea.  Leading to weight loss and borderline hypotension.  Only mildly relieved by Imodium at home. - Cdiff neg, GI panel neg - CT abd reviewed, no evidence of colonic inflammation - Cont IVF and supportive care - Cont on clear liquid diet. Advance diet as tolerated   CAD CHF Hypertension > Known history of CAD status post MI in 2011 last heart cath was in 2019 with chronically occluded RCA, 70% occluded LAD which underwent balloon angioplasty and 99% occluded proximal left circumflex which was stented at that time.  Collaterals present for vascularization of distal RCA vessels. > Last echo was in July of this year with EF stable at [id number], grade 2 diastolic dysfunction, normal RV function.  Is status post AICD - Continue home Entresto and carvedilol - Continue home Zetia - Continue home aspirin and Plavix   History of V. Tach Palpitations > Does have AICD in place - Continue home mexiletine and carvedilol - Monitor on telemetry   Hyperlipidemia Statin intolerance > Is on Zetia and Repatha outpatient - Continue home Zetia    Gout - Continue allopurinol  Depression Anxiety - Continue home Xanax and Zoloft   Asthma - Continue home budesonide and as needed albuterol  ARF -Cr up to 1.41 likely secondary to dehydration -Will increase IVF to 125cc/hr -Repeat BMET in AM   DVT prophylaxis: Lovenox subq Code Status: Full Family Communication: Pt in room, family at bedside  Status is: Observation  The patient will require care spanning > 2 midnights and should be moved to inpatient because: Severity of illness requiring IVF   Consultants:    Procedures:    Antimicrobials: Anti-infectives (From admission, onward)    None       Subjective: Still having diarrhea  Objective: Vitals:   03/04/21 2128 03/05/21 0125 03/05/21 0605 03/05/21 0637  BP: 111/69 99/66 114/74   Pulse: 79 82 77   Resp: 18  16   Temp: 97.9 F (36.6 C) 97.6 F (36.4 C) 97.9 F (36.6 C)   TempSrc: Oral Oral Oral   SpO2: 96% 94% 100%   Weight:    93.8 kg  Height:        Intake/Output Summary (Last 24 hours) at 03/05/2021 1837 Last data filed at 03/05/2021 1022 Gross per 24 hour  Intake 240 ml  Output --  Net 240 ml   Filed Weights   03/04/21 1102 03/05/21 0637  Weight: 90.3 kg 93.8 kg    Examination: General exam: Awake, laying in bed, in nad Respiratory system: Normal respiratory effort, no wheezing Cardiovascular system: regular rate, s1, s2 Gastrointestinal system: Soft, nondistended, positive BS Central nervous system: CN2-12  grossly intact, strength intact Extremities: Perfused, no clubbing Skin: Normal skin turgor, no notable skin lesions seen Psychiatry: Mood normal // no visual hallucinations   Data Reviewed: I have personally reviewed following labs and imaging studies  CBC: Recent Labs  Lab 03/04/21 1118 03/05/21 0523  WBC 8.2 7.0  HGB 16.8 13.8  HCT 54.8* 47.8  MCV 73.2* 77.1*  PLT 297 [id number]   Basic Metabolic Panel: Recent Labs  Lab 03/04/21 1120 03/05/21 0523  NA 135 136  K 3.6  3.5  CL 106 110  CO2 18* 19*  GLUCOSE 114* 96  BUN 17 15  CREATININE 1.34* 1.41*  CALCIUM 9.0 8.2*  MG  --  1.7   GFR: Estimated Creatinine Clearance: 76.9 mL/min (A) (by C-G formula based on SCr of 1.41 mg/dL (H)). Liver Function Tests: Recent Labs  Lab 03/04/21 1120 03/05/21 0523  AST 14* 10*  ALT 12 10  ALKPHOS 82 60  BILITOT 1.0 0.6  PROT 8.2* 6.1*  ALBUMIN 4.3 3.3*   Recent Labs  Lab 03/04/21 1120  LIPASE 35   No results for input(s): AMMONIA in the last 168 hours. Coagulation Profile: No results for input(s): INR, PROTIME in the last 168 hours. Cardiac Enzymes: No results for input(s): CKTOTAL, CKMB, CKMBINDEX, TROPONINI in the last 168 hours. BNP (last 3 results) No results for input(s): PROBNP in the last 8760 hours. HbA1C: No results for input(s): HGBA1C in the last 72 hours. CBG: No results for input(s): GLUCAP in the last 168 hours. Lipid Profile: No results for input(s): CHOL, HDL, LDLCALC, TRIG, CHOLHDL, LDLDIRECT in the last 72 hours. Thyroid Function Tests: No results for input(s): TSH, T4TOTAL, FREET4, T3FREE, THYROIDAB in the last 72 hours. Anemia Panel: No results for input(s): VITAMINB12, FOLATE, FERRITIN, TIBC, IRON, RETICCTPCT in the last 72 hours. Sepsis Labs: Recent Labs  Lab 03/04/21 1507 03/04/21 1854  LATICACIDVEN 1.1 1.1    Recent Results (from the past 240 hour(s))  Resp Panel by RT-PCR (Flu A&B, Covid) Nasopharyngeal Swab     Status: None   Collection Time: 03/04/21 11:00 AM   Specimen: Nasopharyngeal Swab; Nasopharyngeal(NP) swabs in vial transport medium  Result Value Ref Range Status   SARS Coronavirus 2 by RT PCR NEGATIVE NEGATIVE Final    Comment: (NOTE) SARS-CoV-2 target nucleic acids are NOT DETECTED.  The SARS-CoV-2 RNA is generally detectable in upper respiratory specimens during the acute phase of infection. The lowest concentration of SARS-CoV-2 viral copies this assay can detect is 138 copies/mL. A negative  result does not preclude SARS-Cov-2 infection and should not be used as the sole basis for treatment or other patient management decisions. A negative result may occur with  improper specimen collection/handling, submission of specimen other than nasopharyngeal swab, presence of viral mutation(s) within the areas targeted by this assay, and inadequate number of viral copies(<138 copies/mL). A negative result must be combined with clinical observations, patient history, and epidemiological information. The expected result is Negative.  Fact Sheet for Patients:  EntrepreneurPulse.com.au  Fact Sheet for Healthcare Providers:  IncredibleEmployment.be  This test is no t yet approved or cleared by the [place] FDA and  has been authorized for detection and/or diagnosis of SARS-CoV-2 by FDA under an Emergency Use Authorization (EUA). This EUA will remain  in effect (meaning this test can be used) for the duration of the COVID-19 declaration under Section 564(b)(1) of the Act, 21 U.S.C.section 360bbb-3(b)(1), unless the authorization is terminated  or revoked sooner.  Influenza A by PCR NEGATIVE NEGATIVE Final   Influenza B by PCR NEGATIVE NEGATIVE Final    Comment: (NOTE) The Xpert Xpress SARS-CoV-2/FLU/RSV plus assay is intended as an aid in the diagnosis of influenza from Nasopharyngeal swab specimens and should not be used as a sole basis for treatment. Nasal washings and aspirates are unacceptable for Xpert Xpress SARS-CoV-2/FLU/RSV testing.  Fact Sheet for Patients: EntrepreneurPulse.com.au  Fact Sheet for Healthcare Providers: IncredibleEmployment.be  This test is not yet approved or cleared by the [place] FDA and has been authorized for detection and/or diagnosis of SARS-CoV-2 by FDA under an Emergency Use Authorization (EUA). This EUA will remain in effect (meaning this test can be used)  for the duration of the COVID-19 declaration under Section 564(b)(1) of the Act, 21 U.S.C. section 360bbb-3(b)(1), unless the authorization is terminated or revoked.  Performed at [facility], [address] [place], [place] [address]   C Difficile Quick Screen w PCR reflex     Status: None   Collection Time: 03/04/21 12:35 PM   Specimen: STOOL  Result Value Ref Range Status   C Diff antigen NEGATIVE NEGATIVE Final   C Diff toxin NEGATIVE NEGATIVE Final   C Diff interpretation No C. difficile detected.  Final    Comment: Performed at [facility], [place] [address] [place], [place] [address]  Gastrointestinal Panel by PCR , Stool     Status: None   Collection Time: 03/04/21 12:35 PM   Specimen: STOOL  Result Value Ref Range Status   Campylobacter species NOT DETECTED NOT DETECTED Final   Plesimonas shigelloides NOT DETECTED NOT DETECTED Final   Salmonella species NOT DETECTED NOT DETECTED Final   Yersinia enterocolitica NOT DETECTED NOT DETECTED Final   Vibrio species NOT DETECTED NOT DETECTED Final   Vibrio cholerae NOT DETECTED NOT DETECTED Final   Enteroaggregative E coli (EAEC) NOT DETECTED NOT DETECTED Final   Enteropathogenic E coli (EPEC) NOT DETECTED NOT DETECTED Final   Enterotoxigenic E coli (ETEC) NOT DETECTED NOT DETECTED Final   Shiga like toxin producing E coli (STEC) NOT DETECTED NOT DETECTED Final   Shigella/Enteroinvasive E coli (EIEC) NOT DETECTED NOT DETECTED Final   Cryptosporidium NOT DETECTED NOT DETECTED Final   Cyclospora cayetanensis NOT DETECTED NOT DETECTED Final   Entamoeba histolytica NOT DETECTED NOT DETECTED Final   Giardia lamblia NOT DETECTED NOT DETECTED Final   Adenovirus F40/41 NOT DETECTED NOT DETECTED Final   Astrovirus NOT DETECTED NOT DETECTED Final   Norovirus GI/GII NOT DETECTED NOT DETECTED Final   Rotavirus A NOT DETECTED NOT DETECTED Final   Sapovirus (I, II, IV, and V) NOT DETECTED NOT DETECTED Final    Comment:  Performed at [facility], [address] [place], [place] [address]  Culture, blood (single)     Status: None (Preliminary result)   Collection Time: 03/04/21  3:06 PM   Specimen: Left Antecubital; Blood  Result Value Ref Range Status   Specimen Description   Final    LEFT ANTECUBITAL BLOOD Performed at [facility], [address] [place], [place] [address]    Special Requests   Final    Blood Culture adequate volume BOTTLES DRAWN AEROBIC AND ANAEROBIC Performed at [facility], [address] [place], [place] [address]    Culture   Final    NO GROWTH < 12 HOURS Performed at [facility], [address] [place], [place] [address]  Report Status PENDING  Incomplete     Radiology Studies: DG Chest 2 View  Result Date: 03/04/2021 CLINICAL DATA:  Abdominal pain with diarrhea for 5 days. EXAM: CHEST - 2 VIEW COMPARISON:  09/06/2018. FINDINGS: Cardiac silhouette is normal in size and configuration. Normal mediastinal and hilar contours. Stable left anterior chest wall AICD. Clear lungs.  No pleural effusion or pneumothorax. Skeletal structures are intact. IMPRESSION: No active cardiopulmonary disease. Electronically Signed   By: Lajean Manes M.D.   On: 03/04/2021 12:16   CT Angio Chest PE W and/or Wo Contrast  Result Date: 03/04/2021 CLINICAL DATA:  Epigastric abdominal pain and chest pain. Diarrhea. Pulmonary embolism (PE) suspected, high prob EXAM: CT ANGIOGRAPHY CHEST CT ABDOMEN AND PELVIS WITH CONTRAST TECHNIQUE: Multidetector CT imaging of the chest was performed using the standard protocol during bolus administration of intravenous contrast. Multiplanar CT image reconstructions and MIPs were obtained to evaluate the vascular anatomy. Multidetector CT imaging of the abdomen and pelvis was performed using the standard protocol during bolus administration of intravenous contrast. RADIATION DOSE REDUCTION: This exam was performed according to the  departmental dose-optimization program which includes automated exposure control, adjustment of the mA and/or kV according to patient size and/or use of iterative reconstruction technique. CONTRAST:  168mL OMNIPAQUE IOHEXOL 350 MG/ML SOLN COMPARISON:  Chest radiograph from earlier today. 07/05/2016 CT abdomen/pelvis. 11/29/2009 chest CT angiogram. FINDINGS: CTA CHEST FINDINGS Cardiovascular: The study is moderate quality for the evaluation of pulmonary embolism, with some motion degradation. There are no filling defects in the central, lobar, segmental or subsegmental pulmonary artery branches to suggest acute pulmonary embolism. Great vessels are normal in course and caliber. Top-normal heart size. Single lead left subclavian ICD with lead tip in the right ventricular apex. No significant pericardial fluid/thickening. Coronary atherosclerosis. Mediastinum/Nodes: No discrete thyroid nodules. Unremarkable esophagus. No pathologically enlarged axillary, mediastinal or hilar lymph nodes. Lungs/Pleura: No pneumothorax. No pleural effusion. No acute consolidative airspace disease or lung masses. Tiny 2 mm anterior right lower lobe solid pulmonary nodule (series 4/image 47), stable and considered benign. No new significant pulmonary nodules. Musculoskeletal: No aggressive appearing focal osseous lesions. Moderate symmetric bilateral gynecomastia. Review of the MIP images confirms the above findings. CT ABDOMEN and PELVIS FINDINGS Hepatobiliary: Normal liver with no liver mass. Normal gallbladder with no radiopaque cholelithiasis. No biliary ductal dilatation. Pancreas: Normal, with no mass or duct dilation. Spleen: Normal size. No mass. Adrenals/Urinary Tract: Normal adrenals. Subcentimeter hypodense medial upper left renal cortical lesion is too small to characterize and requires no follow-up. Otherwise normal kidneys, with no hydronephrosis. Normal bladder. Stomach/Bowel: Normal non-distended stomach. Normal caliber  small bowel with no small bowel wall thickening. Normal appendix. Prominent fluid levels throughout the colon and rectum. No large bowel wall thickening, diverticulosis or significant pericolonic fat stranding. Vascular/Lymphatic: Atherosclerotic nonaneurysmal abdominal aorta. Patent portal, splenic, hepatic and renal veins. No pathologically enlarged lymph nodes in the abdomen or pelvis. Reproductive: Normal size prostate. Nonspecific internal prostatic calcifications. Other: No pneumoperitoneum, ascites or focal fluid collection. Musculoskeletal: No aggressive appearing focal osseous lesions. Marked degenerative disc disease at L5-S1. Review of the MIP images confirms the above findings. IMPRESSION: 1. No evidence of pulmonary embolism.  No active pulmonary disease. 2. Prominent fluid levels throughout the colon and rectum, indicating a nonspecific malaborptive state as can be seen with colitis. No evidence of bowel obstruction. No bowel wall thickening. 3. Coronary atherosclerosis. 4. Moderate symmetric bilateral gynecomastia. 5. Aortic Atherosclerosis (ICD10-I70.0). Electronically Signed   By: Ilona Sorrel  M.D.   On: 03/04/2021 13:51   CT ABDOMEN PELVIS W CONTRAST  Result Date: 03/04/2021 CLINICAL DATA:  Epigastric abdominal pain and chest pain. Diarrhea. Pulmonary embolism (PE) suspected, high prob EXAM: CT ANGIOGRAPHY CHEST CT ABDOMEN AND PELVIS WITH CONTRAST TECHNIQUE: Multidetector CT imaging of the chest was performed using the standard protocol during bolus administration of intravenous contrast. Multiplanar CT image reconstructions and MIPs were obtained to evaluate the vascular anatomy. Multidetector CT imaging of the abdomen and pelvis was performed using the standard protocol during bolus administration of intravenous contrast. RADIATION DOSE REDUCTION: This exam was performed according to the departmental dose-optimization program which includes automated exposure control, adjustment of the mA  and/or kV according to patient size and/or use of iterative reconstruction technique. CONTRAST:  OMNIPAQUE IOHEXOL 350 MG/ML SOLN COMPARISON:  Chest radiograph from earlier today. 07/05/2016 CT abdomen/pelvis. 11/29/2009 chest CT angiogram. FINDINGS: CTA CHEST FINDINGS Cardiovascular: The study is moderate quality for the evaluation of pulmonary embolism, with some motion degradation. There are no filling defects in the central, lobar, segmental or subsegmental pulmonary artery branches to suggest acute pulmonary embolism. Great vessels are normal in course and caliber. Top-normal heart size. Single lead left subclavian ICD with lead tip in the right ventricular apex. No significant pericardial fluid/thickening. Coronary atherosclerosis. Mediastinum/Nodes: No discrete thyroid nodules. Unremarkable esophagus. No pathologically enlarged axillary, mediastinal or hilar lymph nodes. Lungs/Pleura: No pneumothorax. No pleural effusion. No acute consolidative airspace disease or lung masses. Tiny 2 mm anterior right lower lobe solid pulmonary nodule (series 4/image 47), stable and considered benign. No new significant pulmonary nodules. Musculoskeletal: No aggressive appearing focal osseous lesions. Moderate symmetric bilateral gynecomastia. Review of the MIP images confirms the above findings. CT ABDOMEN and PELVIS FINDINGS Hepatobiliary: Normal liver with no liver mass. Normal gallbladder with no radiopaque cholelithiasis. No biliary ductal dilatation. Pancreas: Normal, with no mass or duct dilation. Spleen: Normal size. No mass. Adrenals/Urinary Tract: Normal adrenals. Subcentimeter hypodense medial upper left renal cortical lesion is too small to characterize and requires no follow-up. Otherwise normal kidneys, with no hydronephrosis. Normal bladder. Stomach/Bowel: Normal non-distended stomach. Normal caliber small bowel with no small bowel wall thickening. Normal appendix. Prominent fluid levels throughout the  colon and rectum. No large bowel wall thickening, diverticulosis or significant pericolonic fat stranding. Vascular/Lymphatic: Atherosclerotic nonaneurysmal abdominal aorta. Patent portal, splenic, hepatic and renal veins. No pathologically enlarged lymph nodes in the abdomen or pelvis. Reproductive: Normal size prostate. Nonspecific internal prostatic calcifications. Other: No pneumoperitoneum, ascites or focal fluid collection. Musculoskeletal: No aggressive appearing focal osseous lesions. Marked degenerative disc disease at L5-S1. Review of the MIP images confirms the above findings. IMPRESSION: 1. No evidence of pulmonary embolism.  No active pulmonary disease. 2. Prominent fluid levels throughout the colon and rectum, indicating a nonspecific malaborptive state as can be seen with colitis. No evidence of bowel obstruction. No bowel wall thickening. 3. Coronary atherosclerosis. 4. Moderate symmetric bilateral gynecomastia. 5. Aortic Atherosclerosis (ICD10-I70.0). Electronically Signed   By: Delbert Phenix M.D.   On: 03/04/2021 13:51    Scheduled Meds:  allopurinol  200 mg Oral Daily   aspirin EC  81 mg Oral Daily   budesonide  0.25 mg Nebulization BID   carvedilol  25 mg Oral BID   clopidogrel  75 mg Oral Daily   enoxaparin (LOVENOX) injection  40 mg Subcutaneous Q24H   ezetimibe  10 mg Oral Daily   mexiletine  200 mg Oral BID   sacubitril-valsartan  1 tablet Oral BID  sertraline  50 mg Oral Daily   sodium chloride flush  3 mL Intravenous Q12H   Continuous Infusions:  lactated ringers 125 mL/hr at 03/05/21 1809     LOS: 0 days   Marylu Lund, MD [facility] Pager On Amion  If 7PM-7AM, please contact night-coverage 03/05/2021, 6:37 PM

## 2021-03-06 LAB — CUP PACEART REMOTE DEVICE CHECK
Battery Remaining Longevity: 3 mo
Battery Remaining Percentage: 3 %
Battery Voltage: 2.6 V
Brady Statistic RV Percent Paced: 1 %
Date Time Interrogation Session: 20230115115852
HighPow Impedance: 53 Ohm
Implantable Lead Implant Date: 20111010
Implantable Lead Location: 753860
Implantable Lead Model: 7121
Implantable Pulse Generator Implant Date: 20111010
Lead Channel Impedance Value: 1100 Ohm
Lead Channel Pacing Threshold Amplitude: 2 V
Lead Channel Pacing Threshold Pulse Width: 1 ms
Lead Channel Sensing Intrinsic Amplitude: 11.9 mV
Lead Channel Setting Pacing Amplitude: 4 V
Lead Channel Setting Pacing Pulse Width: 1 ms
Lead Channel Setting Sensing Sensitivity: 0.5 mV
Pulse Gen Serial Number: 615318

## 2021-03-06 LAB — CBC
HCT: 45.6 % (ref 39.0–52.0)
Hemoglobin: 13.4 g/dL (ref 13.0–17.0)
MCH: 22.4 pg — ABNORMAL LOW (ref 26.0–34.0)
MCHC: 29.4 g/dL — ABNORMAL LOW (ref 30.0–36.0)
MCV: 76.1 fL — ABNORMAL LOW (ref 80.0–100.0)
Platelets: 184 10*3/uL (ref 150–400)
RBC: 5.99 MIL/uL — ABNORMAL HIGH (ref 4.22–5.81)
RDW: 20.2 % — ABNORMAL HIGH (ref 11.5–15.5)
WBC: 6.2 10*3/uL (ref 4.0–10.5)
nRBC: 0 % (ref 0.0–0.2)

## 2021-03-06 LAB — COMPREHENSIVE METABOLIC PANEL
ALT: 9 U/L (ref 0–44)
AST: 10 U/L — ABNORMAL LOW (ref 15–41)
Albumin: 3.2 g/dL — ABNORMAL LOW (ref 3.5–5.0)
Alkaline Phosphatase: 59 U/L (ref 38–126)
Anion gap: 4 — ABNORMAL LOW (ref 5–15)
BUN: 12 mg/dL (ref 6–20)
CO2: 21 mmol/L — ABNORMAL LOW (ref 22–32)
Calcium: 8.4 mg/dL — ABNORMAL LOW (ref 8.9–10.3)
Chloride: 112 mmol/L — ABNORMAL HIGH (ref 98–111)
Creatinine, Ser: 1.07 mg/dL (ref 0.61–1.24)
GFR, Estimated: >60 mL/min (ref >60)
Glucose, Bld: 86 mg/dL (ref 70–99)
Potassium: 3.4 mmol/L — ABNORMAL LOW (ref 3.5–5.1)
Sodium: 137 mmol/L (ref 135–145)
Total Bilirubin: 0.5 mg/dL (ref 0.3–1.2)
Total Protein: 6.1 g/dL — ABNORMAL LOW (ref 6.5–8.1)

## 2021-03-06 MED ORDER — ONDANSETRON HCL 4 MG PO TABS: 4.0000 mg | ORAL_TABLET | Freq: Three times a day (TID) | ORAL | 0 refills | Status: DC | PRN

## 2021-03-06 MED ORDER — POTASSIUM CHLORIDE CRYS ER 20 MEQ PO TBCR
40.0000 meq | EXTENDED_RELEASE_TABLET | Freq: Once | ORAL | Status: AC
  Administered 2021-03-06: 40 meq via ORAL
  Filled 2021-03-06: qty 2

## 2021-03-06 MED ORDER — LACTATED RINGERS IV SOLN: INTRAVENOUS | Status: DC

## 2021-03-06 NOTE — Discharge Summary (Signed)
Physician Discharge Summary  Jason Hogan [id number] DOB: 1975-03-29 DOA: 03/04/2021  PCP: [name], MD  Admit date: 03/04/2021 Discharge date: 03/06/2021  Admitted From: Home Disposition:  Home  Recommendations for Outpatient Follow-up:  Follow up with PCP in 1-2 weeks Follow up with Cardiology as scheduled  Discharge Condition:Improved  CODE STATUS:Full Diet recommendation: Heart healthy   Brief/Interim Summary: 46 y.o. male with medical history significant of sleep disorder, statin induced myopathy, CHF, CAD status post MI, migraines, low back pain, V. tach, hypertension, hyperlipidemia, gout, depression, anxiety, asthma who presents with ongoing diarrhea x 5 days prior to visit associated with 10lb wt loss.  Discharge Diagnoses:  Principal Problem:   Intractable diarrhea Active Problems:   Hyperlipidemia   Depression   Chronic systolic heart failure (HCC)   Asthma   Implantable cardioverter-defibrillator (ICD) in situ   Anxiety   Gout   CAD (coronary artery disease), native coronary artery   Ischemic cardiomyopathy s/p AICD   Hypertension   ARF (acute renal failure) (HCC)  Intractable diarrhea, colitis ruled out. Gastroenteritis ruled in > Presenting with 5 days of multiple episodes of watery diarrhea.  Leading to weight loss and borderline hypotension.  Only mildly relieved by Imodium at home. - Cdiff neg, GI panel neg - CT abd reviewed, no evidence of colonic inflammation - Improved with IVF and diet later advanced to regular   CAD CHF Hypertension > Known history of CAD status post MI in 2011 last heart cath was in 2019 with chronically occluded RCA, 70% occluded LAD which underwent balloon angioplasty and 99% occluded proximal left circumflex which was stented at that time.  Collaterals present for vascularization of distal RCA vessels. > Last echo was in July of this year with EF stable at [id number], grade 2 diastolic dysfunction, normal RV function.  Is  status post AICD - Continue home Entresto and carvedilol - Continue home Zetia - Continue home aspirin and Plavix   History of V. Tach Palpitations > Does have AICD in place - Continue home mexiletine and carvedilol  Hyperlipidemia Statin intolerance > Is on Zetia and Repatha outpatient - Continue home Zetia   Gout - Continue allopurinol  Depression Anxiety - Continue home Xanax and Zoloft   Asthma - Continue home budesonide and as needed albuterol   ARF -Cr up to 1.41 likely secondary to dehydration -resolved with IVF hydration    Discharge Instructions   Allergies as of 03/06/2021       Reactions   Levaquin [levofloxacin] Other (See Comments)   Joint pain   Ace Inhibitors Cough   Crestor [rosuvastatin] Other (See Comments)   "Sweet cravings"   Lunesta [eszopiclone] Other (See Comments)   "Bitter taste in mouth"   Zocor [simvastatin] Other (See Comments)   Memory problem        Medication List     STOP taking these medications    Fluticasone-Salmeterol 250-50 MCG/DOSE Aepb Commonly known as: Wixela Inhub   mupirocin nasal ointment 2 % Commonly known as: BACTROBAN   predniSONE 50 MG tablet Commonly known as: DELTASONE   Thera-D 2000 50 MCG (2000 UT) Tabs Generic drug: Cholecalciferol       TAKE these medications    albuterol 108 (90 Base) MCG/ACT inhaler Commonly known as: VENTOLIN HFA Inhale 1-2 puffs into the lungs every 6 (six) hours as needed for wheezing or shortness of breath.   allopurinol 100 MG tablet Commonly known as: ZYLOPRIM TAKE TWO TABLETS BY MOUTH DAILY What changed:  how much to take when to take this   ALPRAZolam 0.5 MG tablet Commonly known as: XANAX TAKE ONE TABLET BY MOUTH DAILY AS NEEDED FOR ANXIETY What changed: See the new instructions.   aspirin EC 81 MG tablet Take 81 mg by mouth daily.   azelastine 0.1 % nasal spray Commonly known as: ASTELIN Place 1 spray into both nostrils daily as needed for  rhinitis.   Budesonide 90 MCG/ACT inhaler Inhale 1 puff into the lungs 2 (two) times daily. What changed:  when to take this reasons to take this   budesonide-formoterol 80-4.5 MCG/ACT inhaler Commonly known as: SYMBICORT Inhale 2 puffs into the lungs daily as needed (wheezing).   carvedilol 25 MG tablet Commonly known as: COREG Take 1 tablet (25 mg total) by mouth 2 (two) times daily. Needs appt What changed: additional instructions   clopidogrel 75 MG tablet Commonly known as: PLAVIX TAKE ONE TABLET BY MOUTH DAILY   dapagliflozin propanediol 10 MG Tabs tablet Commonly known as: Farxiga Take 1 tablet (10 mg total) by mouth daily before breakfast.   Entresto 97-103 MG Generic drug: sacubitril-valsartan Take 1 tablet by mouth 2 (two) times daily. NEEDS FOLLOW UP APPOINTMENT FOR FURTHER REFILLS What changed: additional instructions   ezetimibe 10 MG tablet Commonly known as: ZETIA Take 1 tablet (10 mg total) by mouth daily.   Klor-Con M20 20 MEQ tablet Generic drug: potassium chloride SA TAKE 2 TABLETS BY MOUTH EVERY DAY What changed:  how much to take how to take this when to take this reasons to take this   metolazone 5 MG tablet Commonly known as: ZAROXOLYN TAKE 1 TABLET (5 MG TOTAL) BY MOUTH DAILY AS NEEDED (FOR EDEMA).   mexiletine 200 MG capsule Commonly known as: MEXITIL TAKE ONE CAPSULE BY MOUTH TWICE A DAY   Repatha SureClick [id number] MG/ML Soaj Generic drug: Evolocumab INJECT ONE PEN UNDER THE SKIN EVERY 14 DAYS What changed: See the new instructions.   sertraline 50 MG tablet Commonly known as: ZOLOFT TAKE ONE TABLET BY MOUTH DAILY   spironolactone 25 MG tablet Commonly known as: ALDACTONE Take 1 tablet (25 mg total) by mouth at bedtime. NEEDS APPOINTMENT FOR MORE REFILLS What changed: additional instructions   tiZANidine 4 MG tablet Commonly known as: ZANAFLEX TAKE ONE TABLET BY MOUTH TWICE A DAY AS NEEDED What changed:  when to take  this reasons to take this   torsemide 20 MG tablet Commonly known as: DEMADEX Take 1 tablet (20 mg total) by mouth daily as needed. What changed: reasons to take this   Trulicity 1.5 0000000 Sopn Generic drug: Dulaglutide Inject 1.5 mg into the skin once a week. Fridays        Follow-up Information     [name], MD Follow up in 2 week(s).   Specialties: Internal Medicine, Radiology Why: Hospital follow up Contact information: [place] [address] [phone]         [name], MD .   Specialty: Cardiology Contact information: [phone] [address] [place] [address] [phone]         Deboraha Sprang, MD .   Specialty: Cardiology Contact information: [phone] [address] [place] [address] [phone]         Larey Dresser, MD .   Specialty: Cardiology Contact information: [address] [place] [address] [phone]  Allergies  Allergen Reactions   Levaquin [Levofloxacin] Other (See Comments)    Joint pain   Ace Inhibitors Cough   Crestor [Rosuvastatin] Other (See Comments)    "Sweet cravings"   Lunesta [Eszopiclone] Other (See Comments)    "Bitter taste in mouth"   Zocor [Simvastatin] Other (See Comments)    Memory problem    Procedures/Studies: DG Chest 2 View  Result Date: 03/04/2021 CLINICAL DATA:  Abdominal pain with diarrhea for 5 days. EXAM: CHEST - 2 VIEW COMPARISON:  09/06/2018. FINDINGS: Cardiac silhouette is normal in size and configuration. Normal mediastinal and hilar contours. Stable left anterior chest wall AICD. Clear lungs.  No pleural effusion or pneumothorax. Skeletal structures are intact. IMPRESSION: No active cardiopulmonary disease. Electronically Signed   By: Lajean Manes M.D.   On: 03/04/2021 12:16   CT Angio Chest PE W and/or Wo Contrast  Result Date: 03/04/2021 CLINICAL DATA:  Epigastric abdominal pain and chest pain.  Diarrhea. Pulmonary embolism (PE) suspected, high prob EXAM: CT ANGIOGRAPHY CHEST CT ABDOMEN AND PELVIS WITH CONTRAST TECHNIQUE: Multidetector CT imaging of the chest was performed using the standard protocol during bolus administration of intravenous contrast. Multiplanar CT image reconstructions and MIPs were obtained to evaluate the vascular anatomy. Multidetector CT imaging of the abdomen and pelvis was performed using the standard protocol during bolus administration of intravenous contrast. RADIATION DOSE REDUCTION: This exam was performed according to the departmental dose-optimization program which includes automated exposure control, adjustment of the mA and/or kV according to patient size and/or use of iterative reconstruction technique. CONTRAST:  172mL OMNIPAQUE IOHEXOL 350 MG/ML SOLN COMPARISON:  Chest radiograph from earlier today. 07/05/2016 CT abdomen/pelvis. 11/29/2009 chest CT angiogram. FINDINGS: CTA CHEST FINDINGS Cardiovascular: The study is moderate quality for the evaluation of pulmonary embolism, with some motion degradation. There are no filling defects in the central, lobar, segmental or subsegmental pulmonary artery branches to suggest acute pulmonary embolism. Great vessels are normal in course and caliber. Top-normal heart size. Single lead left subclavian ICD with lead tip in the right ventricular apex. No significant pericardial fluid/thickening. Coronary atherosclerosis. Mediastinum/Nodes: No discrete thyroid nodules. Unremarkable esophagus. No pathologically enlarged axillary, mediastinal or hilar lymph nodes. Lungs/Pleura: No pneumothorax. No pleural effusion. No acute consolidative airspace disease or lung masses. Tiny 2 mm anterior right lower lobe solid pulmonary nodule (series 4/image 47), stable and considered benign. No new significant pulmonary nodules. Musculoskeletal: No aggressive appearing focal osseous lesions. Moderate symmetric bilateral gynecomastia. Review of the MIP  images confirms the above findings. CT ABDOMEN and PELVIS FINDINGS Hepatobiliary: Normal liver with no liver mass. Normal gallbladder with no radiopaque cholelithiasis. No biliary ductal dilatation. Pancreas: Normal, with no mass or duct dilation. Spleen: Normal size. No mass. Adrenals/Urinary Tract: Normal adrenals. Subcentimeter hypodense medial upper left renal cortical lesion is too small to characterize and requires no follow-up. Otherwise normal kidneys, with no hydronephrosis. Normal bladder. Stomach/Bowel: Normal non-distended stomach. Normal caliber small bowel with no small bowel wall thickening. Normal appendix. Prominent fluid levels throughout the colon and rectum. No large bowel wall thickening, diverticulosis or significant pericolonic fat stranding. Vascular/Lymphatic: Atherosclerotic nonaneurysmal abdominal aorta. Patent portal, splenic, hepatic and renal veins. No pathologically enlarged lymph nodes in the abdomen or pelvis. Reproductive: Normal size prostate. Nonspecific internal prostatic calcifications. Other: No pneumoperitoneum, ascites or focal fluid collection. Musculoskeletal: No aggressive appearing focal osseous lesions. Marked degenerative disc disease at L5-S1. Review of the MIP images confirms the above findings. IMPRESSION: 1. No evidence of pulmonary embolism.  No  active pulmonary disease. 2. Prominent fluid levels throughout the colon and rectum, indicating a nonspecific malaborptive state as can be seen with colitis. No evidence of bowel obstruction. No bowel wall thickening. 3. Coronary atherosclerosis. 4. Moderate symmetric bilateral gynecomastia. 5. Aortic Atherosclerosis (ICD10-I70.0). Electronically Signed   By: Ilona Sorrel M.D.   On: 03/04/2021 13:51   CT ABDOMEN PELVIS W CONTRAST  Result Date: 03/04/2021 CLINICAL DATA:  Epigastric abdominal pain and chest pain. Diarrhea. Pulmonary embolism (PE) suspected, high prob EXAM: CT ANGIOGRAPHY CHEST CT ABDOMEN AND PELVIS WITH  CONTRAST TECHNIQUE: Multidetector CT imaging of the chest was performed using the standard protocol during bolus administration of intravenous contrast. Multiplanar CT image reconstructions and MIPs were obtained to evaluate the vascular anatomy. Multidetector CT imaging of the abdomen and pelvis was performed using the standard protocol during bolus administration of intravenous contrast. RADIATION DOSE REDUCTION: This exam was performed according to the departmental dose-optimization program which includes automated exposure control, adjustment of the mA and/or kV according to patient size and/or use of iterative reconstruction technique. CONTRAST:  19mL OMNIPAQUE IOHEXOL 350 MG/ML SOLN COMPARISON:  Chest radiograph from earlier today. 07/05/2016 CT abdomen/pelvis. 11/29/2009 chest CT angiogram. FINDINGS: CTA CHEST FINDINGS Cardiovascular: The study is moderate quality for the evaluation of pulmonary embolism, with some motion degradation. There are no filling defects in the central, lobar, segmental or subsegmental pulmonary artery branches to suggest acute pulmonary embolism. Great vessels are normal in course and caliber. Top-normal heart size. Single lead left subclavian ICD with lead tip in the right ventricular apex. No significant pericardial fluid/thickening. Coronary atherosclerosis. Mediastinum/Nodes: No discrete thyroid nodules. Unremarkable esophagus. No pathologically enlarged axillary, mediastinal or hilar lymph nodes. Lungs/Pleura: No pneumothorax. No pleural effusion. No acute consolidative airspace disease or lung masses. Tiny 2 mm anterior right lower lobe solid pulmonary nodule (series 4/image 47), stable and considered benign. No new significant pulmonary nodules. Musculoskeletal: No aggressive appearing focal osseous lesions. Moderate symmetric bilateral gynecomastia. Review of the MIP images confirms the above findings. CT ABDOMEN and PELVIS FINDINGS Hepatobiliary: Normal liver with no liver  mass. Normal gallbladder with no radiopaque cholelithiasis. No biliary ductal dilatation. Pancreas: Normal, with no mass or duct dilation. Spleen: Normal size. No mass. Adrenals/Urinary Tract: Normal adrenals. Subcentimeter hypodense medial upper left renal cortical lesion is too small to characterize and requires no follow-up. Otherwise normal kidneys, with no hydronephrosis. Normal bladder. Stomach/Bowel: Normal non-distended stomach. Normal caliber small bowel with no small bowel wall thickening. Normal appendix. Prominent fluid levels throughout the colon and rectum. No large bowel wall thickening, diverticulosis or significant pericolonic fat stranding. Vascular/Lymphatic: Atherosclerotic nonaneurysmal abdominal aorta. Patent portal, splenic, hepatic and renal veins. No pathologically enlarged lymph nodes in the abdomen or pelvis. Reproductive: Normal size prostate. Nonspecific internal prostatic calcifications. Other: No pneumoperitoneum, ascites or focal fluid collection. Musculoskeletal: No aggressive appearing focal osseous lesions. Marked degenerative disc disease at L5-S1. Review of the MIP images confirms the above findings. IMPRESSION: 1. No evidence of pulmonary embolism.  No active pulmonary disease. 2. Prominent fluid levels throughout the colon and rectum, indicating a nonspecific malaborptive state as can be seen with colitis. No evidence of bowel obstruction. No bowel wall thickening. 3. Coronary atherosclerosis. 4. Moderate symmetric bilateral gynecomastia. 5. Aortic Atherosclerosis (ICD10-I70.0). Electronically Signed   By: Ilona Sorrel M.D.   On: 03/04/2021 13:51   CUP PACEART REMOTE DEVICE CHECK  Result Date: 03/06/2021 Scheduled remote reviewed. Normal device function.  Battery estimated 3.73mo Next remote 2/16 [place]  Subjective: Eager to go home  Discharge Exam: Vitals:   03/06/21 0559 03/06/21 1447  BP: 103/61 110/68  Pulse: 72 79  Resp: 18 16  Temp: 98 F (36.7 C) 97.6 F  (36.4 C)  SpO2: 98% 96%   Vitals:   03/05/21 2125 03/05/21 2150 03/06/21 0559 03/06/21 1447  BP: 122/78  103/61 110/68  Pulse: 70  72 79  Resp: 18  18 16   Temp: 97.9 F (36.6 C)  98 F (36.7 C) 97.6 F (36.4 C)  TempSrc: Oral  Oral Oral  SpO2:  100% 98% 96%  Weight:   95.3 kg   Height:        General: Pt is alert, awake, not in acute distress Cardiovascular: RRR, S1/S2 + Respiratory: CTA bilaterally, no wheezing, no rhonchi Abdominal: Soft, NT, ND, bowel sounds + Extremities: no edema, no cyanosis   The results of significant diagnostics from this hospitalization (including imaging, microbiology, ancillary and laboratory) are listed below for reference.     Microbiology: Recent Results (from the past 240 hour(s))  Resp Panel by RT-PCR (Flu A&B, Covid) Nasopharyngeal Swab     Status: None   Collection Time: 03/04/21 11:00 AM   Specimen: Nasopharyngeal Swab; Nasopharyngeal(NP) swabs in vial transport medium  Result Value Ref Range Status   SARS Coronavirus 2 by RT PCR NEGATIVE NEGATIVE Final    Comment: (NOTE) SARS-CoV-2 target nucleic acids are NOT DETECTED.  The SARS-CoV-2 RNA is generally detectable in upper respiratory specimens during the acute phase of infection. The lowest concentration of SARS-CoV-2 viral copies this assay can detect is 138 copies/mL. A negative result does not preclude SARS-Cov-2 infection and should not be used as the sole basis for treatment or other patient management decisions. A negative result may occur with  improper specimen collection/handling, submission of specimen other than nasopharyngeal swab, presence of viral mutation(s) within the areas targeted by this assay, and inadequate number of viral copies(<138 copies/mL). A negative result must be combined with clinical observations, patient history, and epidemiological information. The expected result is Negative.  Fact Sheet for Patients:   EntrepreneurPulse.com.au  Fact Sheet for Healthcare Providers:  IncredibleEmployment.be  This test is no t yet approved or cleared by the [place] FDA and  has been authorized for detection and/or diagnosis of SARS-CoV-2 by FDA under an Emergency Use Authorization (EUA). This EUA will remain  in effect (meaning this test can be used) for the duration of the COVID-19 declaration under Section 564(b)(1) of the Act, 21 U.S.C.section 360bbb-3(b)(1), unless the authorization is terminated  or revoked sooner.       Influenza A by PCR NEGATIVE NEGATIVE Final   Influenza B by PCR NEGATIVE NEGATIVE Final    Comment: (NOTE) The Xpert Xpress SARS-CoV-2/FLU/RSV plus assay is intended as an aid in the diagnosis of influenza from Nasopharyngeal swab specimens and should not be used as a sole basis for treatment. Nasal washings and aspirates are unacceptable for Xpert Xpress SARS-CoV-2/FLU/RSV testing.  Fact Sheet for Patients: EntrepreneurPulse.com.au  Fact Sheet for Healthcare Providers: IncredibleEmployment.be  This test is not yet approved or cleared by the [place] FDA and has been authorized for detection and/or diagnosis of SARS-CoV-2 by FDA under an Emergency Use Authorization (EUA). This EUA will remain in effect (meaning this test can be used) for the duration of the COVID-19 declaration under Section 564(b)(1) of the Act, 21 U.S.C. section 360bbb-3(b)(1), unless the authorization is terminated or revoked.  Performed at [facility], [address] [place], [place] [address]   C Difficile Quick Screen w PCR reflex     Status: None   Collection Time: 03/04/21 12:35 PM   Specimen: STOOL  Result Value Ref Range Status   C Diff antigen NEGATIVE NEGATIVE Final   C Diff toxin NEGATIVE NEGATIVE Final   C Diff interpretation No C. difficile detected.  Final    Comment: Performed at [facility], [place] [address] [place], [place] [address]  Gastrointestinal Panel by PCR , Stool     Status: None   Collection Time: 03/04/21 12:35 PM   Specimen: STOOL  Result Value Ref Range Status   Campylobacter species NOT DETECTED NOT DETECTED Final   Plesimonas shigelloides NOT DETECTED NOT DETECTED Final   Salmonella species NOT DETECTED NOT DETECTED Final   Yersinia enterocolitica NOT DETECTED NOT DETECTED Final   Vibrio species NOT DETECTED NOT DETECTED Final   Vibrio cholerae NOT DETECTED NOT DETECTED Final   Enteroaggregative E coli (EAEC) NOT DETECTED NOT DETECTED Final   Enteropathogenic E coli (EPEC) NOT DETECTED NOT DETECTED Final   Enterotoxigenic E coli (ETEC) NOT DETECTED NOT DETECTED Final   Shiga like toxin producing E coli (STEC) NOT DETECTED NOT DETECTED Final   Shigella/Enteroinvasive E coli (EIEC) NOT DETECTED NOT DETECTED Final   Cryptosporidium NOT DETECTED NOT DETECTED Final   Cyclospora cayetanensis NOT DETECTED NOT DETECTED Final   Entamoeba histolytica NOT DETECTED NOT DETECTED Final   Giardia lamblia NOT DETECTED NOT DETECTED Final   Adenovirus F40/41 NOT DETECTED NOT DETECTED Final   Astrovirus NOT DETECTED NOT DETECTED Final   Norovirus GI/GII NOT DETECTED NOT DETECTED Final   Rotavirus A NOT DETECTED NOT DETECTED Final   Sapovirus (I, II, IV, and V) NOT DETECTED NOT DETECTED Final    Comment: Performed at [facility], [address] [place], [place] [address]  Culture, blood (single)     Status: None (Preliminary result)   Collection Time: 03/04/21  3:06 PM   Specimen: Left Antecubital; Blood  Result Value Ref Range Status   Specimen Description   Final    LEFT ANTECUBITAL BLOOD Performed at [facility], [address] [place], [place] [address]    Special Requests   Final    Blood Culture adequate volume BOTTLES DRAWN AEROBIC AND ANAEROBIC Performed at [facility], [address] [place], [place] [address]     Culture   Final    NO GROWTH 2 DAYS Performed at [facility], [address], [place], [place] [address]    Report Status PENDING  Incomplete     Labs: BNP (last 3 results) No results for input(s): BNP in the last 8760 hours. Basic Metabolic Panel: Recent Labs  Lab 03/04/21 1120 03/05/21 0523 03/06/21 0509  NA 135 136 137  K 3.6 3.5 3.4*  CL 106 110 112*  CO2 18* 19* 21*  GLUCOSE 114* 96 86  BUN 17 15 12   CREATININE 1.34* 1.41* 1.07  CALCIUM 9.0 8.2* 8.4*  MG  --  1.7  --    Liver Function Tests: Recent Labs  Lab 03/04/21 1120 03/05/21 0523 03/06/21 0509  AST 14* 10* 10*  ALT 12 10 9   ALKPHOS 82 60 59  BILITOT 1.0 0.6 0.5  PROT 8.2* 6.1* 6.1*  ALBUMIN 4.3 3.3* 3.2*   Recent Labs  Lab 03/04/21 1120  LIPASE 35   No results for input(s): AMMONIA in the last  168 hours. CBC: Recent Labs  Lab 03/04/21 1118 03/05/21 0523 03/06/21 0509  WBC 8.2 7.0 6.2  NEUTROABS  --  3.2  --   HGB 16.8 13.8 13.4  HCT 54.8* 47.8 45.6  MCV 73.2* 77.1* 76.1*  PLT 297 220 184   Cardiac Enzymes: No results for input(s): CKTOTAL, CKMB, CKMBINDEX, TROPONINI in the last 168 hours. BNP: Invalid input(s): POCBNP CBG: No results for input(s): GLUCAP in the last 168 hours. D-Dimer No results for input(s): DDIMER in the last 72 hours. Hgb A1c No results for input(s): HGBA1C in the last 72 hours. Lipid Profile No results for input(s): CHOL, HDL, LDLCALC, TRIG, CHOLHDL, LDLDIRECT in the last 72 hours. Thyroid function studies No results for input(s): TSH, T4TOTAL, T3FREE, THYROIDAB in the last 72 hours.  Invalid input(s): FREET3 Anemia work up No results for input(s): VITAMINB12, FOLATE, FERRITIN, TIBC, IRON, RETICCTPCT in the last 72 hours. Urinalysis    Component Value Date/Time   COLORURINE YELLOW 05/02/2020 1046   APPEARANCEUR CLEAR 05/02/2020 1046   LABSPEC 1.025 05/02/2020 1046   PHURINE 6.0 05/02/2020 1046   GLUCOSEU NEGATIVE 05/02/2020 1046   HGBUR  NEGATIVE 05/02/2020 [place] 05/02/2020 1046   [place] 05/02/2020 1046   PROTEINUR 100 (A) 08/21/2009 0412   UROBILINOGEN 0.2 05/02/2020 1046   NITRITE NEGATIVE 05/02/2020 1046   LEUKOCYTESUR NEGATIVE 05/02/2020 1046   Sepsis Labs Invalid input(s): PROCALCITONIN,  WBC,  LACTICIDVEN Microbiology Recent Results (from the past 240 hour(s))  Resp Panel by RT-PCR (Flu A&B, Covid) Nasopharyngeal Swab     Status: None   Collection Time: 03/04/21 11:00 AM   Specimen: Nasopharyngeal Swab; Nasopharyngeal(NP) swabs in vial transport medium  Result Value Ref Range Status   SARS Coronavirus 2 by RT PCR NEGATIVE NEGATIVE Final    Comment: (NOTE) SARS-CoV-2 target nucleic acids are NOT DETECTED.  The SARS-CoV-2 RNA is generally detectable in upper respiratory specimens during the acute phase of infection. The lowest concentration of SARS-CoV-2 viral copies this assay can detect is 138 copies/mL. A negative result does not preclude SARS-Cov-2 infection and should not be used as the sole basis for treatment or other patient management decisions. A negative result may occur with  improper specimen collection/handling, submission of specimen other than nasopharyngeal swab, presence of viral mutation(s) within the areas targeted by this assay, and inadequate number of viral copies(<138 copies/mL). A negative result must be combined with clinical observations, patient history, and epidemiological information. The expected result is Negative.  Fact Sheet for Patients:  EntrepreneurPulse.com.au  Fact Sheet for Healthcare Providers:  IncredibleEmployment.be  This test is no t yet approved or cleared by the [place] FDA and  has been authorized for detection and/or diagnosis of SARS-CoV-2 by FDA under an Emergency Use Authorization (EUA). This EUA will remain  in effect (meaning this test can be used) for the duration of  the COVID-19 declaration under Section 564(b)(1) of the Act, 21 U.S.C.section 360bbb-3(b)(1), unless the authorization is terminated  or revoked sooner.       Influenza A by PCR NEGATIVE NEGATIVE Final   Influenza B by PCR NEGATIVE NEGATIVE Final    Comment: (NOTE) The Xpert Xpress SARS-CoV-2/FLU/RSV plus assay is intended as an aid in the diagnosis of influenza from Nasopharyngeal swab specimens and should not be used as a sole basis for treatment. Nasal washings and aspirates are unacceptable for Xpert Xpress SARS-CoV-2/FLU/RSV testing.  Fact Sheet for Patients: EntrepreneurPulse.com.au  Fact Sheet for Healthcare Providers: IncredibleEmployment.be  This test is not yet approved or cleared by the [place] and has been authorized for detection and/or diagnosis of SARS-CoV-2 by FDA under an Emergency Use Authorization (EUA). This EUA will remain in effect (meaning this test can be used) for the duration of the COVID-19 declaration under Section 564(b)(1) of the Act, 21 U.S.C. section 360bbb-3(b)(1), unless the authorization is terminated or revoked.  Performed at [facility], [address] [place], [place] [address]   C Difficile Quick Screen w PCR reflex     Status: None   Collection Time: 03/04/21 12:35 PM   Specimen: STOOL  Result Value Ref Range Status   C Diff antigen NEGATIVE NEGATIVE Final   C Diff toxin NEGATIVE NEGATIVE Final   C Diff interpretation No C. difficile detected.  Final    Comment: Performed at [facility], [place] [address] [place], [place] [address]  Gastrointestinal Panel by PCR , Stool     Status: None   Collection Time: 03/04/21 12:35 PM   Specimen: STOOL  Result Value Ref Range Status   Campylobacter species NOT DETECTED NOT DETECTED Final   Plesimonas shigelloides NOT DETECTED NOT DETECTED Final   Salmonella species NOT DETECTED NOT DETECTED Final   Yersinia enterocolitica NOT  DETECTED NOT DETECTED Final   Vibrio species NOT DETECTED NOT DETECTED Final   Vibrio cholerae NOT DETECTED NOT DETECTED Final   Enteroaggregative E coli (EAEC) NOT DETECTED NOT DETECTED Final   Enteropathogenic E coli (EPEC) NOT DETECTED NOT DETECTED Final   Enterotoxigenic E coli (ETEC) NOT DETECTED NOT DETECTED Final   Shiga like toxin producing E coli (STEC) NOT DETECTED NOT DETECTED Final   Shigella/Enteroinvasive E coli (EIEC) NOT DETECTED NOT DETECTED Final   Cryptosporidium NOT DETECTED NOT DETECTED Final   Cyclospora cayetanensis NOT DETECTED NOT DETECTED Final   Entamoeba histolytica NOT DETECTED NOT DETECTED Final   Giardia lamblia NOT DETECTED NOT DETECTED Final   Adenovirus F40/41 NOT DETECTED NOT DETECTED Final   Astrovirus NOT DETECTED NOT DETECTED Final   Norovirus GI/GII NOT DETECTED NOT DETECTED Final   Rotavirus A NOT DETECTED NOT DETECTED Final   Sapovirus (I, II, IV, and V) NOT DETECTED NOT DETECTED Final    Comment: Performed at [facility], [address] [place], [place] [address]  Culture, blood (single)     Status: None (Preliminary result)   Collection Time: 03/04/21  3:06 PM   Specimen: Left Antecubital; Blood  Result Value Ref Range Status   Specimen Description   Final    LEFT ANTECUBITAL BLOOD Performed at [facility], [address] [place], [place] [address]    Special Requests   Final    Blood Culture adequate volume BOTTLES DRAWN AEROBIC AND ANAEROBIC Performed at [facility], [address] [place], [place] [address]    Culture   Final    NO GROWTH 2 DAYS Performed at [facility], [address], [place], [place] [address]    Report Status PENDING  Incomplete   Time spent: 24min  SIGNED:   Marylu Lund, MD  [facility] 03/06/2021, 5:52 PM  If 7PM-7AM, please contact night-coverage

## 2021-03-09 LAB — CULTURE, BLOOD (SINGLE)
Culture: NO GROWTH
Special Requests: ADEQUATE

## 2021-03-15 NOTE — Progress Notes (Signed)
Remote ICD transmission.   

## 2021-04-05 ENCOUNTER — Ambulatory Visit: Payer: 59

## 2021-04-05 DIAGNOSIS — I255 Ischemic cardiomyopathy: Secondary | ICD-10-CM

## 2021-04-05 DIAGNOSIS — I5022 Chronic systolic (congestive) heart failure: Secondary | ICD-10-CM

## 2021-04-05 LAB — CUP PACEART REMOTE DEVICE CHECK
Battery Remaining Longevity: 3 mo
Battery Remaining Percentage: 3 %
Battery Voltage: 2.6 V
Brady Statistic RV Percent Paced: 1 %
Date Time Interrogation Session: 20230216035403
HighPow Impedance: 51 Ohm
Implantable Lead Implant Date: 20111010
Implantable Lead Location: 753860
Implantable Lead Model: 7121
Implantable Pulse Generator Implant Date: 20111010
Lead Channel Impedance Value: 1050 Ohm
Lead Channel Pacing Threshold Amplitude: 2 V
Lead Channel Pacing Threshold Pulse Width: 1 ms
Lead Channel Sensing Intrinsic Amplitude: 11.9 mV
Lead Channel Setting Pacing Amplitude: 4 V
Lead Channel Setting Pacing Pulse Width: 1 ms
Lead Channel Setting Sensing Sensitivity: 0.5 mV
Pulse Gen Serial Number: 615318

## 2021-04-10 NOTE — Progress Notes (Signed)
Remote ICD transmission.   

## 2021-04-13 ENCOUNTER — Other Ambulatory Visit: Payer: Self-pay | Admitting: Cardiology

## 2021-04-26 ENCOUNTER — Other Ambulatory Visit (HOSPITAL_COMMUNITY): Payer: Self-pay | Admitting: *Deleted

## 2021-04-26 DIAGNOSIS — I5022 Chronic systolic (congestive) heart failure: Secondary | ICD-10-CM

## 2021-04-26 MED ORDER — SPIRONOLACTONE 25 MG PO TABS: ORAL_TABLET | ORAL | 3 refills | Status: DC

## 2021-05-01 ENCOUNTER — Encounter: Payer: Self-pay | Admitting: Internal Medicine

## 2021-05-02 ENCOUNTER — Telehealth: Payer: Self-pay

## 2021-05-02 NOTE — Telephone Encounter (Signed)
Merlin alert, ERI reached 05/01/21. Routed for further review and management. ? ? ?Successful telephone encounter to patient to discuss RRT status. Of note patient sent my chart message 05/01/21 related to vibrating device and was advised of RRT and encouraged to contact office to schedule office visit as he has not been seen by Dr. [name] since 2020. Advised patient that scheduling will contact him for OV with Dr. [name] to discuss gen change.  ? ? ? ? ? ?

## 2021-05-03 ENCOUNTER — Other Ambulatory Visit (HOSPITAL_COMMUNITY): Payer: Self-pay | Admitting: Cardiology

## 2021-05-03 ENCOUNTER — Other Ambulatory Visit: Payer: Self-pay | Admitting: Internal Medicine

## 2021-05-03 NOTE — Telephone Encounter (Signed)
Pt is scheduled for 05/18/2021 with Dr Graciela Husbands ?

## 2021-05-07 ENCOUNTER — Ambulatory Visit (INDEPENDENT_AMBULATORY_CARE_PROVIDER_SITE_OTHER): Payer: 59

## 2021-05-07 DIAGNOSIS — I255 Ischemic cardiomyopathy: Secondary | ICD-10-CM

## 2021-05-08 DIAGNOSIS — M9903 Segmental and somatic dysfunction of lumbar region: Secondary | ICD-10-CM | POA: Diagnosis not present

## 2021-05-08 DIAGNOSIS — M5386 Other specified dorsopathies, lumbar region: Secondary | ICD-10-CM | POA: Diagnosis not present

## 2021-05-08 DIAGNOSIS — M9905 Segmental and somatic dysfunction of pelvic region: Secondary | ICD-10-CM | POA: Diagnosis not present

## 2021-05-08 DIAGNOSIS — M9902 Segmental and somatic dysfunction of thoracic region: Secondary | ICD-10-CM | POA: Diagnosis not present

## 2021-05-08 LAB — CUP PACEART REMOTE DEVICE CHECK
Battery Remaining Longevity: 0 mo
Battery Voltage: 2.59 V
Brady Statistic RV Percent Paced: 1 %
Date Time Interrogation Session: 20230320015021
HighPow Impedance: 49 Ohm
Implantable Lead Implant Date: 20111010
Implantable Lead Location: 753860
Implantable Lead Model: 7121
Implantable Pulse Generator Implant Date: 20111010
Lead Channel Impedance Value: 1050 Ohm
Lead Channel Pacing Threshold Amplitude: 2 V
Lead Channel Pacing Threshold Pulse Width: 1 ms
Lead Channel Sensing Intrinsic Amplitude: 11.9 mV
Lead Channel Setting Pacing Amplitude: 4 V
Lead Channel Setting Pacing Pulse Width: 1 ms
Lead Channel Setting Sensing Sensitivity: 0.5 mV
Pulse Gen Serial Number: 615318

## 2021-05-15 DIAGNOSIS — M9902 Segmental and somatic dysfunction of thoracic region: Secondary | ICD-10-CM | POA: Diagnosis not present

## 2021-05-15 DIAGNOSIS — M5386 Other specified dorsopathies, lumbar region: Secondary | ICD-10-CM | POA: Diagnosis not present

## 2021-05-15 DIAGNOSIS — M9903 Segmental and somatic dysfunction of lumbar region: Secondary | ICD-10-CM | POA: Diagnosis not present

## 2021-05-15 DIAGNOSIS — M9905 Segmental and somatic dysfunction of pelvic region: Secondary | ICD-10-CM | POA: Diagnosis not present

## 2021-05-18 ENCOUNTER — Encounter: Payer: Self-pay | Admitting: Internal Medicine

## 2021-05-18 ENCOUNTER — Ambulatory Visit: Payer: BC Managed Care – PPO | Admitting: Internal Medicine

## 2021-05-18 VITALS — BP 116/78 | HR 80 | Ht 74.0 in | Wt 216.0 lb

## 2021-05-18 DIAGNOSIS — I5022 Chronic systolic (congestive) heart failure: Secondary | ICD-10-CM | POA: Diagnosis not present

## 2021-05-18 DIAGNOSIS — Z95810 Presence of automatic (implantable) cardiac defibrillator: Secondary | ICD-10-CM | POA: Diagnosis not present

## 2021-05-18 DIAGNOSIS — I255 Ischemic cardiomyopathy: Secondary | ICD-10-CM

## 2021-05-18 NOTE — Patient Instructions (Addendum)
Medication Instructions:  ?Your physician recommends that you continue on your current medications as directed. Please refer to the Current Medication list given to you today. ? ?*If you need a refill on your cardiac medications before your next appointment, please call your pharmacy* ? ? ?Lab Work: ?None ordered ? ? ?Testing/Procedures: ?Your physician has recommneded that you have a defibrillator generator (battery) change.   (ERI 05/01/21) ?     Please call the office when you are ready to schedule.  Dates available listed below ? ? ?Follow-Up: ?At [facility], you and your health needs are our priority.  As part of our continuing mission to provide you with exceptional heart care, we have created designated Provider Care Teams.  These Care Teams include your primary Cardiologist (physician) and Advanced Practice Providers (APPs -  Physician Assistants and Nurse Practitioners) who all work together to provide you with the care you need, when you need it. ? ?Your next appointment:   ?2 week(s) after your defibrillator battery change ? ?The format for your next appointment:   ?In Person ? ?Provider:   ?Device clinic for a wound check ? ? ? ?Thank you for choosing [facility]!! ? ? ?[phone] ? ? ?Other Instructions ? ? 4/17  or    6/02 ?

## 2021-05-18 NOTE — H&P (View-Only) (Signed)
? ? ? ? ?Patient Care Team: ?[name], MD as PCP - General ?[name], MD as PCP - Cardiology (Cardiology) ?Deboraha Sprang, MD as PCP - Electrophysiology (Cardiology) ?Larey Dresser, MD as PCP - Advanced Heart Failure (Cardiology) ?[name], MD (Cardiology) ? ? ?HPI ? ?Jason Hogan is a 46 y.o. male ?Seen in follow-up for ventricular tachycardia with a history of Abbott  ICD implantation (DOI- 10/11) and previous appropriate therapy;  he has a history of ischemic heart disease.  ?  ?Hx of CAD Ant MI 2011-- ?Underwent Cath 9/19 for unstable angina  Found As Below to have progressive CAD  ? ?Readmitted about 2 weeks later with recurrent chest pain associated with variable heart rates. ? ?The patient denies chest pain, nocturnal dyspnea, orthopnea or peripheral edema.  There have been no palpitations, or syncope.  Complains of significant lightheadedness last week.  Went home and blood pressures in the 80s.  Mild chronic stable exercise intolerance ?DATE TEST EF   ?3/17 Echo   30-35 %   ?7/17 Echo   40-45 %   ?9/19 Echo  30-35%   ?9/19 LHC  Cx 90% Stent/LAD 90% PTCA ?RCA T-old  ?6/22 Echo  30-35%   ? ?Date Cr K Hgb   ?12/18 1.19 4.7   ?1/23  1.07 3.4 13.4  ? ?Event recorder 10/20 demonstrated infrequent PVCs and 1 run of VT NS ? ?Device History: ?ICD implanted 2011 for primary prevention  ?History of appropriate therapy: Yes   3/17 ?VT-History of AAD therapy: Yes  ATP and shocks ?  ?  ? ?Tolerating mexilitene  ? ?Past Medical History:  ?Diagnosis Date  ? Acute Myocardial Infarction 08/18/2009  ? Qualifier: Diagnosis of  By: [name]    ? Allergic rhinitis, cause unspecified 10/03/2011  ? Anxiety 10/03/2011  ? Asthma 09/20/2006  ? Qualifier: Diagnosis of  By: [name], [name]   ? 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 "With pacing function" (11/25/2017). Qualifier: Diagnosis of  By: [name], MD, [name]   ? CAD (coronary artery disease)   ? a. large  anterior MI 2011 s/p PTCA/DES to 100%. b. occluded LAD with occluded collateralized silent RCA, balloon angioplasty to stented segment of LAD and DES x1 to mid circumflex in 10/2017.  ? CHF (congestive heart failure) ([place])   ? Cholelithiasis 06/22/2007  ? Qualifier: Diagnosis of  By: [name]   ? Chronic systolic heart failure ([place]) 09/18/2009  ? Qualifier: Diagnosis of  By: [name]    ? Depressive disorder, not elsewhere classified 06/22/2007  ? Qualifier: Diagnosis of  By: [name]   ? History of gout   ? History of kidney stones   ? Hyperlipidemia 81/2011  ? Hypertension   ? Impaired glucose tolerance 10/05/2012  ? Insomnia, unspecified 06/22/2007  ? Qualifier: Diagnosis of  By: [name], [name]   ? Ischemic cardiomyopathy 10/20/2009  ? Qualifier: Diagnosis of  By: [name], MD, Remus Blake   ? Low testosterone 10/14/2013  ? Migraine Headache 09/18/2009  ? Qualifier: Diagnosis of  By: [name]    ? Renal calculus   ? Renal Insufficiency 09/18/2009  ? Qualifier: Diagnosis of  By: [name]    ? Respiratory failure ([place]) 08/2009  ? Rhabdomyolysis 09/18/2009  ? Qualifier: Diagnosis of  By: [name]    ? VT (ventricular tachycardia)   ? ? ?Past Surgical History:  ?Procedure  Laterality Date  ? CARDIAC CATHETERIZATION N/A 05/17/2015  ? Procedure: Left Heart Cath and Coronary Angiography;  Surgeon: [name], MD;  Location: [facility];  Service: Cardiovascular;  Laterality: N/A;  ? CARDIAC CATHETERIZATION  2012  ? CORONARY ANGIOPLASTY WITH STENT PLACEMENT  2011  ? CORONARY STENT INTERVENTION N/A 11/14/2017  ? Procedure: CORONARY STENT INTERVENTION;  Surgeon: Burnell Blanks, MD;  Location: [facility];  Service: Cardiovascular;  Laterality: N/A;  ? ICD IMPLANT  11/2009  ? "w/pacing function"  ? RIGHT/LEFT HEART CATH AND CORONARY ANGIOGRAPHY N/A 11/14/2017  ? Procedure: RIGHT/LEFT HEART CATH AND CORONARY ANGIOGRAPHY;  Surgeon:  Burnell Blanks, MD;  Location: [facility];  Service: Cardiovascular;  Laterality: N/A;  ? ? ?Current Outpatient Medications  ?Medication Sig Dispense Refill  ? albuterol (VENTOLIN HFA) 108 (90 Base) MCG/ACT inhaler Inhale 1-2 puffs into the lungs every 6 (six) hours as needed for wheezing or shortness of breath. 18 g 11  ? allopurinol (ZYLOPRIM) 100 MG tablet TAKE TWO TABLETS BY MOUTH DAILY (Patient taking differently: Take 100 mg by mouth 2 (two) times daily.) 180 tablet 0  ? ALPRAZolam (XANAX) 0.5 MG tablet TAKE ONE TABLET BY MOUTH DAILY AS NEEDED FOR ANXIETY (Patient taking differently: Take 0.5 mg by mouth daily as needed for anxiety.) 30 tablet 5  ? aspirin EC 81 MG tablet Take 81 mg by mouth daily.    ? azelastine (ASTELIN) 0.1 % nasal spray Place 1 spray into both nostrils daily as needed for rhinitis.    ? budesonide-formoterol (SYMBICORT) 80-4.5 MCG/ACT inhaler Inhale 2 puffs into the lungs daily as needed (wheezing).    ? carvedilol (COREG) 25 MG tablet Take 1 tablet (25 mg total) by mouth 2 (two) times daily. Needs appt (Patient taking differently: Take 25 mg by mouth 2 (two) times daily.) 120 tablet 6  ? clopidogrel (PLAVIX) 75 MG tablet TAKE ONE TABLET BY MOUTH DAILY (Patient taking differently: Take 75 mg by mouth daily.) 90 tablet 1  ? dapagliflozin propanediol (FARXIGA) 10 MG TABS tablet Take 1 tablet (10 mg total) by mouth daily before breakfast. 30 tablet 11  ? Dulaglutide (TRULICITY) 1.5 0000000 SOPN Inject 1.5 mg into the skin once a week. Fridays    ? ezetimibe (ZETIA) 10 MG tablet Take 1 tablet (10 mg total) by mouth daily. 30 tablet 11  ? metolazone (ZAROXOLYN) 5 MG tablet TAKE 1 TABLET (5 MG TOTAL) BY MOUTH DAILY AS NEEDED (FOR EDEMA). 30 tablet 9  ? mexiletine (MEXITIL) 200 MG capsule TAKE ONE CAPSULE BY MOUTH TWICE A DAY 180 capsule 1  ? ondansetron (ZOFRAN) 4 MG tablet Take 1 tablet (4 mg total) by mouth every 8 (eight) hours as needed for nausea or vomiting. 20 tablet 0   ? potassium chloride SA (KLOR-CON M) 20 MEQ tablet Take 20 mEq by mouth daily as needed (when taking torsemide).    ? REPATHA SURECLICK [id number] MG/ML SOAJ INJECT ONE PEN UNDER THE SKIN EVERY 14 DAYS (Patient taking differently: Inject 140 mg into the skin every 14 (fourteen) days.) 2 mL 11  ? sacubitril-valsartan (ENTRESTO) 97-103 MG Take 1 tablet by mouth 2 (two) times daily. 60 tablet 11  ? sertraline (ZOLOFT) 50 MG tablet TAKE ONE TABLET BY MOUTH DAILY (Patient taking differently: Take 50 mg by mouth daily.) 90 tablet 3  ? spironolactone (ALDACTONE) 25 MG tablet Take 1 tablet by mouth daily. 30 tablet 3  ? tiZANidine (ZANAFLEX) 4 MG tablet  TAKE ONE TABLET BY MOUTH TWICE A DAY AS NEEDED 60 tablet 2  ? torsemide (DEMADEX) 20 MG tablet Take 1 tablet (20 mg total) by mouth daily as needed. (Patient taking differently: Take 20 mg by mouth daily as needed (edema).) 30 tablet 2  ? Budesonide 90 MCG/ACT inhaler Inhale 1 puff into the lungs 2 (two) times daily. (Patient taking differently: Inhale 1 puff into the lungs daily as needed (wheezing).) 3 each 3  ? ?No current facility-administered medications for this visit.  ? ? ?Allergies  ?Allergen Reactions  ? Levaquin [Levofloxacin] Other (See Comments)  ?  Joint pain  ? Ace Inhibitors Cough  ? Crestor [Rosuvastatin] Other (See Comments)  ?  "Sweet cravings"  ? Lunesta [Eszopiclone] Other (See Comments)  ?  "Bitter taste in mouth"  ? Zocor [Simvastatin] Other (See Comments)  ?  Memory problem  ? ? ? ? ?Review of Systems negative except from HPI and PMH ? ?Physical Exam ?BP 116/78   Pulse 80   Ht 6\' 2"  (1.88 m)   Wt 216 lb (98 kg)   SpO2 94%   BMI 27.73 kg/m?  ?Well developed and well nourished in no acute distress ?HENT normal ?Neck supple with JVP-flat ?Clear ?Device pocket well healed; without hematoma or erythema.  There is no tethering  ?Regular rate and rhythm, no murmur ?Abd-soft with active BS ?No Clubbing cyanosis  edema ?Skin-warm and dry ?A & Oriented  Grossly  normal sensory and motor function ? ?ECG sinus at 80 ?Both 19/11/37 ?Poor R wave progression Q waves V1-V4 ?  ? ?Assessment and  Plan ? ?Ventricular tachycardia ? ?Lightheadedness with hypotension ? ?Ischemic Car

## 2021-05-18 NOTE — Progress Notes (Signed)
? ? ? ? ?Patient Care Team: ?[name], MD as PCP - General ?[name], MD as PCP - Cardiology (Cardiology) ?Deboraha Sprang, MD as PCP - Electrophysiology (Cardiology) ?Larey Dresser, MD as PCP - Advanced Heart Failure (Cardiology) ?[name], MD (Cardiology) ? ? ?HPI ? ?Jason Hogan is a 46 y.o. male ?Seen in follow-up for ventricular tachycardia with a history of Abbott  ICD implantation (DOI- 10/11) and previous appropriate therapy;  he has a history of ischemic heart disease.  ?  ?Hx of CAD Ant MI 2011-- ?Underwent Cath 9/19 for unstable angina  Found As Below to have progressive CAD  ? ?Readmitted about 2 weeks later with recurrent chest pain associated with variable heart rates. ? ?The patient denies chest pain, nocturnal dyspnea, orthopnea or peripheral edema.  There have been no palpitations, or syncope.  Complains of significant lightheadedness last week.  Went home and blood pressures in the 80s.  Mild chronic stable exercise intolerance ?DATE TEST EF   ?3/17 Echo   30-35 %   ?7/17 Echo   40-45 %   ?9/19 Echo  30-35%   ?9/19 LHC  Cx 90% Stent/LAD 90% PTCA ?RCA T-old  ?6/22 Echo  30-35%   ? ?Date Cr K Hgb   ?12/18 1.19 4.7   ?1/23  1.07 3.4 13.4  ? ?Event recorder 10/20 demonstrated infrequent PVCs and 1 run of VT NS ? ?Device History: ?ICD implanted 2011 for primary prevention  ?History of appropriate therapy: Yes   3/17 ?VT-History of AAD therapy: Yes  ATP and shocks ?  ?  ? ?Tolerating mexilitene  ? ?Past Medical History:  ?Diagnosis Date  ? Acute Myocardial Infarction 08/18/2009  ? Qualifier: Diagnosis of  By: [name]    ? Allergic rhinitis, cause unspecified 10/03/2011  ? Anxiety 10/03/2011  ? Asthma 09/20/2006  ? Qualifier: Diagnosis of  By: [name], [name]   ? 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 "With pacing function" (11/25/2017). Qualifier: Diagnosis of  By: [name], MD, [name]   ? CAD (coronary artery disease)   ? a. large  anterior MI 2011 s/p PTCA/DES to 100%. b. occluded LAD with occluded collateralized silent RCA, balloon angioplasty to stented segment of LAD and DES x1 to mid circumflex in 10/2017.  ? CHF (congestive heart failure) ([place])   ? Cholelithiasis 06/22/2007  ? Qualifier: Diagnosis of  By: [name]   ? Chronic systolic heart failure ([place]) 09/18/2009  ? Qualifier: Diagnosis of  By: [name]    ? Depressive disorder, not elsewhere classified 06/22/2007  ? Qualifier: Diagnosis of  By: [name]   ? History of gout   ? History of kidney stones   ? Hyperlipidemia 81/2011  ? Hypertension   ? Impaired glucose tolerance 10/05/2012  ? Insomnia, unspecified 06/22/2007  ? Qualifier: Diagnosis of  By: [name], [name]   ? Ischemic cardiomyopathy 10/20/2009  ? Qualifier: Diagnosis of  By: [name], MD, Remus Blake   ? Low testosterone 10/14/2013  ? Migraine Headache 09/18/2009  ? Qualifier: Diagnosis of  By: [name]    ? Renal calculus   ? Renal Insufficiency 09/18/2009  ? Qualifier: Diagnosis of  By: [name]    ? Respiratory failure ([place]) 08/2009  ? Rhabdomyolysis 09/18/2009  ? Qualifier: Diagnosis of  By: [name]    ? VT (ventricular tachycardia)   ? ? ?Past Surgical History:  ?Procedure  Laterality Date  ? CARDIAC CATHETERIZATION N/A 05/17/2015  ? Procedure: Left Heart Cath and Coronary Angiography;  Surgeon: Peter C Nishan, MD;  [facility]: [facility];  Service: Cardiovascular;  Laterality: N/A;  ? CARDIAC CATHETERIZATION  2012  ? CORONARY ANGIOPLASTY WITH STENT PLACEMENT  2011  ? CORONARY STENT INTERVENTION N/A 11/14/2017  ? Procedure: CORONARY STENT INTERVENTION;  Surgeon: McAlhany, Christopher D, MD;  Location: [facility];  Service: Cardiovascular;  Laterality: N/A;  ? ICD IMPLANT  11/2009  ? "w/pacing function"  ? RIGHT/LEFT HEART CATH AND CORONARY ANGIOGRAPHY N/A 11/14/2017  ? Procedure: RIGHT/LEFT HEART CATH AND CORONARY ANGIOGRAPHY;  Surgeon:  McAlhany, Christopher D, MD;  Location: [facility];  Service: Cardiovascular;  Laterality: N/A;  ? ? ?Current Outpatient Medications  ?Medication Sig Dispense Refill  ? albuterol (VENTOLIN HFA) 108 (90 Base) MCG/ACT inhaler Inhale 1-2 puffs into the lungs every 6 (six) hours as needed for wheezing or shortness of breath. 18 g 11  ? allopurinol (ZYLOPRIM) 100 MG tablet TAKE TWO TABLETS BY MOUTH DAILY (Patient taking differently: Take 100 mg by mouth 2 (two) times daily.) 180 tablet 0  ? ALPRAZolam (XANAX) 0.5 MG tablet TAKE ONE TABLET BY MOUTH DAILY AS NEEDED FOR ANXIETY (Patient taking differently: Take 0.5 mg by mouth daily as needed for anxiety.) 30 tablet 5  ? aspirin EC 81 MG tablet Take 81 mg by mouth daily.    ? azelastine (ASTELIN) 0.1 % nasal spray Place 1 spray into both nostrils daily as needed for rhinitis.    ? budesonide-formoterol (SYMBICORT) 80-4.5 MCG/ACT inhaler Inhale 2 puffs into the lungs daily as needed (wheezing).    ? carvedilol (COREG) 25 MG tablet Take 1 tablet (25 mg total) by mouth 2 (two) times daily. Needs appt (Patient taking differently: Take 25 mg by mouth 2 (two) times daily.) 120 tablet 6  ? clopidogrel (PLAVIX) 75 MG tablet TAKE ONE TABLET BY MOUTH DAILY (Patient taking differently: Take 75 mg by mouth daily.) 90 tablet 1  ? dapagliflozin propanediol (FARXIGA) 10 MG TABS tablet Take 1 tablet (10 mg total) by mouth daily before breakfast. 30 tablet 11  ? Dulaglutide (TRULICITY) 1.5 MG/0.5ML SOPN Inject 1.5 mg into the skin once a week. Fridays    ? ezetimibe (ZETIA) 10 MG tablet Take 1 tablet (10 mg total) by mouth daily. 30 tablet 11  ? metolazone (ZAROXOLYN) 5 MG tablet TAKE 1 TABLET (5 MG TOTAL) BY MOUTH DAILY AS NEEDED (FOR EDEMA). 30 tablet 9  ? mexiletine (MEXITIL) 200 MG capsule TAKE ONE CAPSULE BY MOUTH TWICE A DAY 180 capsule 1  ? ondansetron (ZOFRAN) 4 MG tablet Take 1 tablet (4 mg total) by mouth every 8 (eight) hours as needed for nausea or vomiting. 20 tablet 0   ? potassium chloride SA (KLOR-CON M) 20 MEQ tablet Take 20 mEq by mouth daily as needed (when taking torsemide).    ? REPATHA SURECLICK 140 MG/ML SOAJ INJECT ONE PEN UNDER THE SKIN EVERY 14 DAYS (Patient taking differently: Inject 140 mg into the skin every 14 (fourteen) days.) 2 mL 11  ? sacubitril-valsartan (ENTRESTO) 97-103 MG Take 1 tablet by mouth 2 (two) times daily. 60 tablet 11  ? sertraline (ZOLOFT) 50 MG tablet TAKE ONE TABLET BY MOUTH DAILY (Patient taking differently: Take 50 mg by mouth daily.) 90 tablet 3  ? spironolactone (ALDACTONE) 25 MG tablet Take 1 tablet by mouth daily. 30 tablet 3  ? tiZANidine (ZANAFLEX) 4 MG tablet   TAKE ONE TABLET BY MOUTH TWICE A DAY AS NEEDED 60 tablet 2  ? torsemide (DEMADEX) 20 MG tablet Take 1 tablet (20 mg total) by mouth daily as needed. (Patient taking differently: Take 20 mg by mouth daily as needed (edema).) 30 tablet 2  ? Budesonide 90 MCG/ACT inhaler Inhale 1 puff into the lungs 2 (two) times daily. (Patient taking differently: Inhale 1 puff into the lungs daily as needed (wheezing).) 3 each 3  ? ?No current facility-administered medications for this visit.  ? ? ?Allergies  ?Allergen Reactions  ? Levaquin [Levofloxacin] Other (See Comments)  ?  Joint pain  ? Ace Inhibitors Cough  ? Crestor [Rosuvastatin] Other (See Comments)  ?  "Sweet cravings"  ? Lunesta [Eszopiclone] Other (See Comments)  ?  "Bitter taste in mouth"  ? Zocor [Simvastatin] Other (See Comments)  ?  Memory problem  ? ? ? ? ?Review of Systems negative except from HPI and PMH ? ?Physical Exam ?BP 116/78   Pulse 80   Ht 6\' 2"  (1.88 m)   Wt 216 lb (98 kg)   SpO2 94%   BMI 27.73 kg/m?  ?Well developed and well nourished in no acute distress ?HENT normal ?Neck supple with JVP-flat ?Clear ?Device pocket well healed; without hematoma or erythema.  There is no tethering  ?Regular rate and rhythm, no murmur ?Abd-soft with active BS ?No Clubbing cyanosis  edema ?Skin-warm and dry ?A & Oriented  Grossly  normal sensory and motor function ? ?ECG sinus at 80 ?Both 19/11/37 ?Poor R wave progression Q waves V1-V4 ?  ? ?Assessment and  Plan ? ?Ventricular tachycardia ? ?Lightheadedness with hypotension ? ?Ischemic Car

## 2021-05-22 NOTE — Progress Notes (Signed)
Remote ICD transmission.   

## 2021-05-22 NOTE — Addendum Note (Signed)
Addended by: Elease Etienne A on: 05/22/2021 10:26 AM ? ? Modules accepted: Level of Service ? ?

## 2021-05-24 ENCOUNTER — Other Ambulatory Visit (HOSPITAL_COMMUNITY): Payer: Self-pay

## 2021-05-24 ENCOUNTER — Encounter: Payer: Self-pay | Admitting: Internal Medicine

## 2021-05-24 ENCOUNTER — Other Ambulatory Visit (HOSPITAL_COMMUNITY): Payer: Self-pay | Admitting: Cardiology

## 2021-05-24 ENCOUNTER — Encounter (HOSPITAL_COMMUNITY): Payer: Self-pay

## 2021-05-24 DIAGNOSIS — Z95810 Presence of automatic (implantable) cardiac defibrillator: Secondary | ICD-10-CM

## 2021-05-24 DIAGNOSIS — I5022 Chronic systolic (congestive) heart failure: Secondary | ICD-10-CM

## 2021-05-24 DIAGNOSIS — I255 Ischemic cardiomyopathy: Secondary | ICD-10-CM

## 2021-05-24 DIAGNOSIS — Z01812 Encounter for preprocedural laboratory examination: Secondary | ICD-10-CM

## 2021-05-24 MED ORDER — DAPAGLIFLOZIN PROPANEDIOL 10 MG PO TABS: 10.0000 mg | ORAL_TABLET | Freq: Every day | ORAL | 11 refills | Status: DC

## 2021-05-24 MED ORDER — EZETIMIBE 10 MG PO TABS: 10.0000 mg | ORAL_TABLET | Freq: Every day | ORAL | 11 refills | Status: DC

## 2021-06-01 ENCOUNTER — Telehealth: Payer: Self-pay

## 2021-06-01 ENCOUNTER — Other Ambulatory Visit (HOSPITAL_COMMUNITY): Payer: Self-pay | Admitting: Cardiology

## 2021-06-01 ENCOUNTER — Other Ambulatory Visit: Payer: BC Managed Care – PPO | Admitting: *Deleted

## 2021-06-01 DIAGNOSIS — Z01812 Encounter for preprocedural laboratory examination: Secondary | ICD-10-CM

## 2021-06-01 DIAGNOSIS — Z95810 Presence of automatic (implantable) cardiac defibrillator: Secondary | ICD-10-CM

## 2021-06-01 DIAGNOSIS — I5022 Chronic systolic (congestive) heart failure: Secondary | ICD-10-CM

## 2021-06-01 DIAGNOSIS — I255 Ischemic cardiomyopathy: Secondary | ICD-10-CM | POA: Diagnosis not present

## 2021-06-01 LAB — CBC
Hematocrit: 45.6 % (ref 37.5–51.0)
Hemoglobin: 14.2 g/dL (ref 13.0–17.7)
MCH: 23.4 pg — ABNORMAL LOW (ref 26.6–33.0)
MCHC: 31.1 g/dL — ABNORMAL LOW (ref 31.5–35.7)
MCV: 75 fL — ABNORMAL LOW (ref 79–97)
Platelets: 226 10*3/uL (ref 150–450)
RBC: 6.06 x10E6/uL — ABNORMAL HIGH (ref 4.14–5.80)
RDW: 19 % — ABNORMAL HIGH (ref 11.6–15.4)
WBC: 9.4 10*3/uL (ref 3.4–10.8)

## 2021-06-01 LAB — BASIC METABOLIC PANEL
BUN/Creatinine Ratio: 8 — ABNORMAL LOW (ref 9–20)
BUN: 8 mg/dL (ref 6–24)
CO2: 23 mmol/L (ref 20–29)
Calcium: 9.1 mg/dL (ref 8.7–10.2)
Chloride: 101 mmol/L (ref 96–106)
Creatinine, Ser: 1 mg/dL (ref 0.76–1.27)
Glucose: 102 mg/dL — ABNORMAL HIGH (ref 70–99)
Potassium: 4.2 mmol/L (ref 3.5–5.2)
Sodium: 137 mmol/L (ref 134–144)
eGFR: 95 mL/min/{1.73_m2} (ref >59)

## 2021-06-01 NOTE — Telephone Encounter (Signed)
Spoke with pt and advised of cancellation on Dr Odessa Fleming schedule 06/04/2021 for generator change and requested pt now arrive at [facility] at 9am.  Pt verbalizes understanding and agrees with current plan. ?

## 2021-06-01 NOTE — Pre-Procedure Instructions (Signed)
Instructed patient on the following items: Arrival time 1200 Nothing to eat or drink after midnight No meds AM of procedure Responsible person to drive you home and stay with you for 24 hrs Wash with special soap night before and morning of procedure  

## 2021-06-04 ENCOUNTER — Encounter (HOSPITAL_COMMUNITY)
Admission: RE | Disposition: A | Discharge status: Home / Self Care | Payer: BC Managed Care – PPO | Source: Ambulatory Visit | Attending: Internal Medicine

## 2021-06-04 ENCOUNTER — Encounter: Payer: Self-pay | Admitting: Internal Medicine

## 2021-06-04 ENCOUNTER — Ambulatory Visit (HOSPITAL_COMMUNITY)
Admission: RE | Admit: 2021-06-04 | Discharge: 2021-06-04 | Disposition: A | Discharge status: Home / Self Care | Payer: BC Managed Care – PPO | Source: Ambulatory Visit | Attending: Internal Medicine | Admitting: Internal Medicine

## 2021-06-04 ENCOUNTER — Other Ambulatory Visit: Payer: Self-pay

## 2021-06-04 DIAGNOSIS — I5022 Chronic systolic (congestive) heart failure: Secondary | ICD-10-CM | POA: Diagnosis not present

## 2021-06-04 DIAGNOSIS — Z4502 Encounter for adjustment and management of automatic implantable cardiac defibrillator: Secondary | ICD-10-CM

## 2021-06-04 DIAGNOSIS — Z79899 Other long term (current) drug therapy: Secondary | ICD-10-CM | POA: Insufficient documentation

## 2021-06-04 DIAGNOSIS — R42 Dizziness and giddiness: Secondary | ICD-10-CM | POA: Insufficient documentation

## 2021-06-04 DIAGNOSIS — I959 Hypotension, unspecified: Secondary | ICD-10-CM | POA: Insufficient documentation

## 2021-06-04 DIAGNOSIS — I255 Ischemic cardiomyopathy: Secondary | ICD-10-CM

## 2021-06-04 DIAGNOSIS — Z95810 Presence of automatic (implantable) cardiac defibrillator: Secondary | ICD-10-CM

## 2021-06-04 DIAGNOSIS — I110 Hypertensive heart disease with heart failure: Secondary | ICD-10-CM | POA: Diagnosis not present

## 2021-06-04 DIAGNOSIS — I472 Ventricular tachycardia, unspecified: Secondary | ICD-10-CM

## 2021-06-04 HISTORY — PX: ICD GENERATOR CHANGEOUT: EP1231

## 2021-06-04 SURGERY — ICD GENERATOR CHANGEOUT

## 2021-06-04 MED ORDER — ACETAMINOPHEN 325 MG PO TABS: 325.0000 mg | ORAL_TABLET | ORAL | Status: DC | PRN

## 2021-06-04 MED ORDER — LIDOCAINE HCL (PF) 1 % IJ SOLN
INTRAMUSCULAR | Status: AC
  Filled 2021-06-04: qty 30

## 2021-06-04 MED ORDER — CEFAZOLIN SODIUM-DEXTROSE 2-4 GM/100ML-% IV SOLN
INTRAVENOUS | Status: AC
  Filled 2021-06-04: qty 100

## 2021-06-04 MED ORDER — SODIUM CHLORIDE 0.9 % IV SOLN: INTRAVENOUS | Status: DC

## 2021-06-04 MED ORDER — CEFAZOLIN SODIUM-DEXTROSE 2-4 GM/100ML-% IV SOLN
2.0000 g | INTRAVENOUS | Status: AC
  Administered 2021-06-04: 2 g via INTRAVENOUS

## 2021-06-04 MED ORDER — FENTANYL CITRATE (PF) 100 MCG/2ML IJ SOLN
INTRAMUSCULAR | Status: AC
  Filled 2021-06-04: qty 2

## 2021-06-04 MED ORDER — SODIUM CHLORIDE 0.9 % IV SOLN
80.0000 mg | INTRAVENOUS | Status: AC
  Administered 2021-06-04: 80 mg

## 2021-06-04 MED ORDER — LIDOCAINE HCL (PF) 1 % IJ SOLN
INTRAMUSCULAR | Status: DC | PRN
  Administered 2021-06-04: 45 mL

## 2021-06-04 MED ORDER — MIDAZOLAM HCL 5 MG/5ML IJ SOLN
INTRAMUSCULAR | Status: DC | PRN
  Administered 2021-06-04: 2 mg via INTRAVENOUS
  Administered 2021-06-04 (×2): 1 mg via INTRAVENOUS

## 2021-06-04 MED ORDER — MIDAZOLAM HCL 5 MG/5ML IJ SOLN
INTRAMUSCULAR | Status: AC
  Filled 2021-06-04: qty 5

## 2021-06-04 MED ORDER — SODIUM CHLORIDE 0.9 % IV SOLN
INTRAVENOUS | Status: AC
  Filled 2021-06-04: qty 2

## 2021-06-04 MED ORDER — FENTANYL CITRATE (PF) 100 MCG/2ML IJ SOLN
INTRAMUSCULAR | Status: DC | PRN
  Administered 2021-06-04: 50 ug via INTRAVENOUS
  Administered 2021-06-04 (×2): 25 ug via INTRAVENOUS

## 2021-06-04 SURGICAL SUPPLY — 5 items
CABLE SURGICAL S-101-97-12 (CABLE) ×2 IMPLANT
HEMOSTAT SURGICEL 2X4 FIBR (HEMOSTASIS) ×1 IMPLANT
ICD FORTIFY ASSU VR CD1357-40C (ICD Generator) ×1 IMPLANT
PAD DEFIB RADIO PHYSIO CONN (PAD) ×2 IMPLANT
TRAY PACEMAKER INSERTION (PACKS) ×2 IMPLANT

## 2021-06-04 NOTE — Interval H&P Note (Signed)
History and Physical Interval Note: ? ?06/04/2021 ?10:04 AM ? ?Jason Hogan  has presented today for surgery, with the diagnosis of ERI.  The various methods of treatment have been discussed with the patient and family. After consideration of risks, benefits and other options for treatment, the patient has consented to  Procedure(s): ?ICD [place] (N/A) as a surgical intervention.  The patient's history has been reviewed, patient examined, no change in status, stable for surgery.  I have reviewed the patient's chart and labs.  Questions were answered to the patient's satisfaction.   ? ? ?Virl Axe ? ? ?

## 2021-06-04 NOTE — Discharge Instructions (Signed)
Implantable Cardiac Device Battery Change, Care After ? ?This sheet gives you information about how to care for yourself after your procedure. Your health care provider may also give you more specific instructions. If you have problems or questions, contact your health care provider. ?What can I expect after the procedure? ?After your procedure, it is common to have: ?Pain or soreness at the site where the cardiac device was inserted. ?Swelling at the site where the cardiac device was inserted. ?You should received an information card for your new device in 4-8 weeks. ?Follow these instructions at home: ?Incision care  ?Keep the incision clean and dry. ?Do not take baths, swim, or use a hot tub until after your wound check.  ?Do not shower for at least 7 days, or as directed by your health care provider. You may shower in 24 hours ?Pat the area dry with a clean towel. Do not rub the area. This may cause bleeding. ?Follow instructions from your health care provider about how to take care of your incision. Make sure you: ?Leave stitches (sutures), skin glue, or adhesive strips in place. These skin closures may need to stay in place for 2 weeks or longer. If adhesive strip edges start to loosen and curl up, you may trim the loose edges. Do not remove adhesive strips completely unless your health care provider tells you to do that. ?Check your incision area every day for signs of infection. Check for: ?More redness, swelling, or pain. ?More fluid or blood. ?Warmth. ?Pus or a bad smell. ?Activity ?Do not lift anything that is heavier than 10 lb (4.5 kg) until your health care provider says it is okay to do so. ?For the first week, or as long as told by your health care provider: ?Avoid lifting your affected arm higher than your shoulder. ?After 1 week, Be gentle when you move your arms over your head. It is okay to raise your arm to comb your hair. ?Avoid strenuous exercise. ?Ask your health care provider when it is okay  to: ?Resume your normal activities. ?Return to work or school. ?Resume sexual activity. ?Eating and drinking ?Eat a heart-healthy diet. This should include plenty of fresh fruits and vegetables, whole grains, low-fat dairy products, and lean protein like chicken and fish. ?Limit alcohol intake to no more than 1 drink a day for non-pregnant women and 2 drinks a day for men. One drink equals 12 oz of beer, 5 oz of wine, or 1? oz of hard liquor. ?Check ingredients and nutrition facts on packaged foods and beverages. Avoid the following types of food: ?Food that is high in salt (sodium). ?Food that is high in saturated fat, like full-fat dairy or red meat. ?Food that is high in trans fat, like fried food. ?Food and drinks that are high in sugar. ?Lifestyle ?Do not use any products that contain nicotine or tobacco, such as cigarettes and e-cigarettes. If you need help quitting, ask your health care provider. ?Take steps to manage and control your weight. ?Once cleared, get regular exercise. Aim for 150 minutes of moderate-intensity exercise (such as walking or yoga) or 75 minutes of vigorous exercise (such as running or swimming) each week. ?Manage other health problems, such as diabetes or high blood pressure. Ask your health care provider how you can manage these conditions. ?General instructions ?Do not drive for 24 hours after your procedure if you were given a medicine to help you relax (sedative). ?Take over-the-counter and prescription medicines only as told by your  health care provider. ?Avoid putting pressure on the area where the cardiac device was placed. ?If you need an MRI after your cardiac device has been placed, be sure to tell the health care provider who orders the MRI that you have a cardiac device. ?Avoid close and prolonged exposure to electrical devices that have strong magnetic fields. These include: ?Cell phones. Avoid keeping them in a pocket near the cardiac device, and try using the ear  opposite the cardiac device. ?MP3 players. ?Household appliances, like microwaves. ?Metal detectors. ?Electric generators. ?High-tension wires. ?Keep all follow-up visits as directed by your health care provider. This is important. ?Contact a health care provider if: ?You have pain at the incision site that is not relieved by over-the-counter or prescription medicines. ?You have any of these around your incision site or coming from it: ?More redness, swelling, or pain. ?Fluid or blood. ?Warmth to the touch. ?Pus or a bad smell. ?You have a fever. ?You feel brief, occasional palpitations, light-headedness, or any symptoms that you think might be related to your heart. ?Get help right away if: ?You experience chest pain that is different from the pain at the cardiac device site. ?You develop a red streak that extends above or below the incision site. ?You experience shortness of breath. ?You have palpitations or an irregular heartbeat. ?You have light-headedness that does not go away quickly. ?You faint or have dizzy spells. ?Your pulse suddenly drops or increases rapidly and does not return to normal. ?You begin to gain weight and your legs and ankles swell. ?Summary ?After your procedure, it is common to have pain, soreness, and some swelling where the cardiac device was inserted. ?Make sure to keep your incision clean and dry. Follow instructions from your health care provider about how to take care of your incision. ?Check your incision every day for signs of infection, such as more pain or swelling, pus or a bad smell, warmth, or leaking fluid and blood. ?Avoid strenuous exercise and lifting your left arm higher than your shoulder for 2 weeks, or as long as told by your health care provider. ?This information is not intended to replace advice given to you by your health care provider. Make sure you discuss any questions you have with your health care provider. ? ?

## 2021-06-04 NOTE — Interval H&P Note (Signed)
History and Physical Interval Note: ? ?06/04/2021 ?10:12 AM ? ?Jason Hogan  has presented today for surgery, with the diagnosis of ERI.  The various methods of treatment have been discussed with the patient and family. After consideration of risks, benefits and other options for treatment, the patient has consented to  Procedure(s): ?ICD [place] (N/A) as a surgical intervention.  The patient's history has been reviewed, patient examined, no change in status, stable for surgery.  I have reviewed the patient's chart and labs.  Questions were answered to the patient's satisfaction.   ? ? ?Virl Axe ? ?Some wheezing  ?Currently not audible  ?

## 2021-06-05 ENCOUNTER — Encounter (HOSPITAL_COMMUNITY): Payer: Self-pay | Admitting: Internal Medicine

## 2021-06-07 ENCOUNTER — Encounter: Payer: Self-pay | Admitting: Internal Medicine

## 2021-06-14 ENCOUNTER — Ambulatory Visit (INDEPENDENT_AMBULATORY_CARE_PROVIDER_SITE_OTHER): Payer: BC Managed Care – PPO

## 2021-06-14 DIAGNOSIS — I5022 Chronic systolic (congestive) heart failure: Secondary | ICD-10-CM | POA: Diagnosis not present

## 2021-06-14 LAB — CUP PACEART INCLINIC DEVICE CHECK
Battery Remaining Longevity: 102 mo
Brady Statistic RV Percent Paced: 0 %
Date Time Interrogation Session: 20230427111200
HighPow Impedance: 43.4871
Implantable Lead Implant Date: 20111010
Implantable Lead Location: 753860
Implantable Lead Model: 7121
Implantable Pulse Generator Implant Date: 20230417
Lead Channel Impedance Value: 1050 Ohm
Lead Channel Pacing Threshold Amplitude: 2.5 V
Lead Channel Pacing Threshold Amplitude: 2.5 V
Lead Channel Pacing Threshold Pulse Width: 1 ms
Lead Channel Pacing Threshold Pulse Width: 1 ms
Lead Channel Sensing Intrinsic Amplitude: 11.5 mV
Lead Channel Setting Pacing Amplitude: 2.75 V
Lead Channel Setting Pacing Pulse Width: 1 ms
Lead Channel Setting Sensing Sensitivity: 0.5 mV
Pulse Gen Serial Number: 8926353

## 2021-06-14 NOTE — Patient Instructions (Signed)
? ?  After Your ICD ?(Implantable Cardiac Defibrillator) ? ? ? ?Monitor your defibrillator site for redness, swelling, and drainage. Call the device clinic at [phone] if you experience these symptoms or fever/chills. ? ?Keep incision site open to air, wash daily with clean wash cloth, soap  and water only pat area during washing and drying. ?Wear clean shirt everyday and clean shirt to bed.  ? ? ?Your ICD is not MRI compatible. ? ?Your ICD is designed to protect you from life threatening heart rhythms. Because of this, you may receive a shock.  ? ?1 shock with no symptoms:  Call the office during business hours. ?1 shock with symptoms (chest pain, chest pressure, dizziness, lightheadedness, shortness of breath, overall feeling unwell):  Call 911. ?If you experience 2 or more shocks in 24 hours:  Call 911. ?If you receive a shock, you should not drive.  ?[place] DMV - no driving for 6 months if you receive appropriate therapy from your ICD.  ? ?ICD Alerts:  Some alerts are vibratory and others beep. These are NOT emergencies. Please call our office to let us know. If this occurs at night or on weekends, it can wait until the next business day. Send a remote transmission. ? ?If your device is capable of reading fluid status (for heart failure), you will be offered monthly monitoring to review this with you.  ? ?Remote monitoring is used to monitor your ICD from home. This monitoring is scheduled every 91 days by our office. It allows [place] to keep an eye on the functioning of your device to ensure it is working properly. You will routinely see your Electrophysiologist annually (more often if necessary).  ?

## 2021-06-14 NOTE — Progress Notes (Signed)
Wound check appointment. Dermabond removed , incision edges un-approximated. See media tab for picture of site. Site assessed by Otilio Saber PA recommended  keeping incision site open to air, wash daily with clean cloth Normal device function. Threshold, sensing, and impedances consistent with implant measurements. Device programmed at chronic output values Histogram distribution appropriate for patient and level of activity. No ventricular rhythm noted. Patient educated about wound care. ROV on 06/21/21 with SK in office. ?

## 2021-06-21 ENCOUNTER — Ambulatory Visit: Payer: BC Managed Care – PPO

## 2021-06-21 ENCOUNTER — Other Ambulatory Visit: Payer: Self-pay | Admitting: Family Medicine

## 2021-06-21 NOTE — Patient Instructions (Signed)
Continue with current treatment (wash with a warm soapy clean cloth daily and pat dry/leave open to air) ? ?If area worsens in any way please call the device clinic at [phone]. ? ?You are scheduled for a wound check appointment on May 24 at 9:00 am. If your wound is healed and the scabbing is gone you may cancel appointment.  ?

## 2021-06-23 NOTE — Progress Notes (Signed)
Wound re-check appointment. Dr Graciela Husbands assessed. Continue with daily washes with warm soapy water and leave open to air. Tentative follow up appointment 07/11/21 however patient instructed to cancel if wound has healed and scabbing has resolved.  ? ? ?

## 2021-07-11 ENCOUNTER — Ambulatory Visit: Payer: BC Managed Care – PPO

## 2021-07-12 ENCOUNTER — Ambulatory Visit (INDEPENDENT_AMBULATORY_CARE_PROVIDER_SITE_OTHER): Payer: BC Managed Care – PPO

## 2021-07-12 DIAGNOSIS — I255 Ischemic cardiomyopathy: Secondary | ICD-10-CM

## 2021-07-12 NOTE — Patient Instructions (Signed)
Please call if you have any further questions or concerns.   Device Clinic [phone]

## 2021-07-12 NOTE — Progress Notes (Signed)
Patient seen in device clinic by Dr. Graciela Husbands for wound recheck. Incision is  appears well healed, incision edges well approximated. Small scab noted at proximal end of incision. No redness, swelling, drainage, fever or chills noted. Per Dr. Graciela Husbands, patient to continue to wash site daily with warm soapy water, clean wash cloth each time.  Leave site clean, dry and open to air. No further orders given. Wound care education provided to pt and advised to monitor for s/s of infection and call the office if any present. Patient voiced understanding.

## 2021-07-18 ENCOUNTER — Other Ambulatory Visit (HOSPITAL_COMMUNITY): Payer: Self-pay | Admitting: Cardiology

## 2021-08-02 ENCOUNTER — Other Ambulatory Visit: Payer: Self-pay | Admitting: Family Medicine

## 2021-08-06 ENCOUNTER — Other Ambulatory Visit: Payer: Self-pay | Admitting: Internal Medicine

## 2021-08-13 ENCOUNTER — Ambulatory Visit (HOSPITAL_COMMUNITY)
Admission: RE | Admit: 2021-08-13 | Discharge: 2021-08-13 | Disposition: A | Discharge status: Home / Self Care | Payer: BC Managed Care – PPO | Source: Ambulatory Visit | Attending: Internal Medicine | Admitting: Internal Medicine

## 2021-08-13 ENCOUNTER — Encounter (HOSPITAL_COMMUNITY): Payer: Self-pay | Admitting: Cardiology

## 2021-08-13 ENCOUNTER — Ambulatory Visit (HOSPITAL_BASED_OUTPATIENT_CLINIC_OR_DEPARTMENT_OTHER)
Admission: RE | Admit: 2021-08-13 | Discharge: 2021-08-13 | Disposition: A | Discharge status: Home / Self Care | Payer: BC Managed Care – PPO | Source: Ambulatory Visit | Attending: Cardiology | Admitting: Cardiology

## 2021-08-13 VITALS — BP 124/80 | HR 85 | Wt 226.0 lb

## 2021-08-13 DIAGNOSIS — I493 Ventricular premature depolarization: Secondary | ICD-10-CM | POA: Diagnosis not present

## 2021-08-13 DIAGNOSIS — Z79899 Other long term (current) drug therapy: Secondary | ICD-10-CM | POA: Diagnosis not present

## 2021-08-13 DIAGNOSIS — I2510 Atherosclerotic heart disease of native coronary artery without angina pectoris: Secondary | ICD-10-CM | POA: Insufficient documentation

## 2021-08-13 DIAGNOSIS — F419 Anxiety disorder, unspecified: Secondary | ICD-10-CM | POA: Insufficient documentation

## 2021-08-13 DIAGNOSIS — D751 Secondary polycythemia: Secondary | ICD-10-CM | POA: Insufficient documentation

## 2021-08-13 DIAGNOSIS — I255 Ischemic cardiomyopathy: Secondary | ICD-10-CM | POA: Insufficient documentation

## 2021-08-13 DIAGNOSIS — R002 Palpitations: Secondary | ICD-10-CM | POA: Insufficient documentation

## 2021-08-13 DIAGNOSIS — E785 Hyperlipidemia, unspecified: Secondary | ICD-10-CM | POA: Diagnosis not present

## 2021-08-13 DIAGNOSIS — I252 Old myocardial infarction: Secondary | ICD-10-CM | POA: Diagnosis not present

## 2021-08-13 DIAGNOSIS — Z955 Presence of coronary angioplasty implant and graft: Secondary | ICD-10-CM | POA: Diagnosis not present

## 2021-08-13 DIAGNOSIS — M791 Myalgia, unspecified site: Secondary | ICD-10-CM | POA: Insufficient documentation

## 2021-08-13 DIAGNOSIS — I5022 Chronic systolic (congestive) heart failure: Secondary | ICD-10-CM | POA: Diagnosis not present

## 2021-08-13 DIAGNOSIS — I110 Hypertensive heart disease with heart failure: Secondary | ICD-10-CM | POA: Insufficient documentation

## 2021-08-13 DIAGNOSIS — Z7902 Long term (current) use of antithrombotics/antiplatelets: Secondary | ICD-10-CM | POA: Diagnosis not present

## 2021-08-13 DIAGNOSIS — K529 Noninfective gastroenteritis and colitis, unspecified: Secondary | ICD-10-CM | POA: Insufficient documentation

## 2021-08-13 LAB — BASIC METABOLIC PANEL
Anion gap: 5 (ref 5–15)
BUN: 12 mg/dL (ref 6–20)
CO2: 22 mmol/L (ref 22–32)
Calcium: 8.7 mg/dL — ABNORMAL LOW (ref 8.9–10.3)
Chloride: 109 mmol/L (ref 98–111)
Creatinine, Ser: 0.93 mg/dL (ref 0.61–1.24)
GFR, Estimated: >60 mL/min (ref >60)
Glucose, Bld: 90 mg/dL (ref 70–99)
Potassium: 5 mmol/L (ref 3.5–5.1)
Sodium: 136 mmol/L (ref 135–145)

## 2021-08-13 LAB — LIPID PANEL
Cholesterol: 113 mg/dL (ref 0–200)
HDL: 31 mg/dL — ABNORMAL LOW (ref >40)
LDL Cholesterol: UNDETERMINED mg/dL (ref 0–99)
Total CHOL/HDL Ratio: 3.6 RATIO
Triglycerides: 428 mg/dL — ABNORMAL HIGH (ref <150)
VLDL: UNDETERMINED mg/dL (ref 0–40)

## 2021-08-13 LAB — ECHOCARDIOGRAM COMPLETE
AR max vel: 2.49 cm2
AV Peak grad: 5.8 mmHg
Ao pk vel: 1.2 m/s
Area-P 1/2: 4.39 cm2
Calc EF: 35 %
S' Lateral: 4.3 cm
Single Plane A2C EF: 34.7 %
Single Plane A4C EF: 34.8 %

## 2021-08-13 LAB — LDL CHOLESTEROL, DIRECT: Direct LDL: 54.9 mg/dL (ref 0–99)

## 2021-08-13 MED ORDER — CARVEDILOL 12.5 MG PO TABS: 12.5000 mg | ORAL_TABLET | Freq: Two times a day (BID) | ORAL | 3 refills | Status: DC

## 2021-08-13 MED ORDER — PERFLUTREN LIPID MICROSPHERE
1.0000 mL | INTRAVENOUS | Status: DC | PRN
  Administered 2021-08-13: 2 mL via INTRAVENOUS

## 2021-08-24 ENCOUNTER — Telehealth (HOSPITAL_COMMUNITY): Payer: Self-pay | Admitting: Cardiology

## 2021-08-24 MED ORDER — ICOSAPENT ETHYL 1 G PO CAPS: 2.0000 g | ORAL_CAPSULE | Freq: Two times a day (BID) | ORAL | 6 refills | Status: DC

## 2021-08-24 NOTE — Telephone Encounter (Signed)
-----   Message from Laurey Morale, MD sent at 08/13/2021  4:40 PM EDT ----- Good LDL but triglycerides way too high.  Need to start Vascepa 2g bid. Lipids 2 months.

## 2021-08-24 NOTE — Telephone Encounter (Signed)
Patient called.  Unable to reach patient. Letter mailed

## 2021-08-28 ENCOUNTER — Telehealth (HOSPITAL_COMMUNITY): Payer: Self-pay | Admitting: Pharmacist

## 2021-08-28 NOTE — Telephone Encounter (Signed)
Advanced Heart Failure Patient Advocate Encounter  Prior Authorization for Jason Hogan has been approved.    Effective dates: 08/28/21 through 08/27/22  Karle Plumber, PharmD, BCPS, BCCP, CPP Heart Failure Clinic Pharmacist [phone]

## 2021-09-01 ENCOUNTER — Other Ambulatory Visit (HOSPITAL_COMMUNITY): Payer: Self-pay | Admitting: Cardiology

## 2021-09-04 ENCOUNTER — Ambulatory Visit (INDEPENDENT_AMBULATORY_CARE_PROVIDER_SITE_OTHER): Payer: BC Managed Care – PPO

## 2021-09-04 DIAGNOSIS — I255 Ischemic cardiomyopathy: Secondary | ICD-10-CM | POA: Diagnosis not present

## 2021-09-04 LAB — CUP PACEART REMOTE DEVICE CHECK
Battery Remaining Longevity: 97 mo
Battery Remaining Percentage: 94 %
Battery Voltage: 3.19 V
Brady Statistic RV Percent Paced: 0 %
Date Time Interrogation Session: 20230718020015
HighPow Impedance: 49 Ohm
HighPow Impedance: 49 Ohm
Implantable Lead Implant Date: 20111010
Implantable Lead Location: 753860
Implantable Lead Model: 7121
Implantable Pulse Generator Implant Date: 20230417
Lead Channel Impedance Value: 1050 Ohm
Lead Channel Pacing Threshold Amplitude: 2.5 V
Lead Channel Pacing Threshold Pulse Width: 1 ms
Lead Channel Sensing Intrinsic Amplitude: 11.5 mV
Lead Channel Setting Pacing Amplitude: 2.75 V
Lead Channel Setting Pacing Pulse Width: 1 ms
Lead Channel Setting Sensing Sensitivity: 0.5 mV
Pulse Gen Serial Number: 8926353

## 2021-09-06 ENCOUNTER — Encounter: Payer: BC Managed Care – PPO | Admitting: Internal Medicine

## 2021-09-17 ENCOUNTER — Ambulatory Visit (INDEPENDENT_AMBULATORY_CARE_PROVIDER_SITE_OTHER): Payer: BC Managed Care – PPO | Admitting: Internal Medicine

## 2021-09-17 ENCOUNTER — Encounter: Payer: Self-pay | Admitting: Internal Medicine

## 2021-09-17 VITALS — BP 102/70 | HR 82 | Temp 98.5°F | Ht 74.0 in | Wt 223.0 lb

## 2021-09-17 DIAGNOSIS — R7302 Impaired glucose tolerance (oral): Secondary | ICD-10-CM

## 2021-09-17 DIAGNOSIS — I10 Essential (primary) hypertension: Secondary | ICD-10-CM | POA: Diagnosis not present

## 2021-09-17 DIAGNOSIS — E782 Mixed hyperlipidemia: Secondary | ICD-10-CM

## 2021-09-17 DIAGNOSIS — E559 Vitamin D deficiency, unspecified: Secondary | ICD-10-CM | POA: Diagnosis not present

## 2021-09-17 DIAGNOSIS — R748 Abnormal levels of other serum enzymes: Secondary | ICD-10-CM | POA: Diagnosis not present

## 2021-09-17 DIAGNOSIS — E291 Testicular hypofunction: Secondary | ICD-10-CM

## 2021-09-17 DIAGNOSIS — M109 Gout, unspecified: Secondary | ICD-10-CM

## 2021-09-17 DIAGNOSIS — Z0001 Encounter for general adult medical examination with abnormal findings: Secondary | ICD-10-CM

## 2021-09-17 DIAGNOSIS — Z1211 Encounter for screening for malignant neoplasm of colon: Secondary | ICD-10-CM

## 2021-09-17 DIAGNOSIS — Z125 Encounter for screening for malignant neoplasm of prostate: Secondary | ICD-10-CM | POA: Diagnosis not present

## 2021-09-17 DIAGNOSIS — E538 Deficiency of other specified B group vitamins: Secondary | ICD-10-CM | POA: Diagnosis not present

## 2021-09-17 LAB — LIPID PANEL
Cholesterol: 129 mg/dL (ref 0–200)
HDL: 34.9 mg/dL — ABNORMAL LOW (ref >39.00)
LDL Cholesterol: 55 mg/dL (ref 0–99)
NonHDL: 94.25
Total CHOL/HDL Ratio: 4
Triglycerides: 198 mg/dL — ABNORMAL HIGH (ref 0.0–149.0)
VLDL: 39.6 mg/dL (ref 0.0–40.0)

## 2021-09-17 LAB — VITAMIN B12: Vitamin B-12: 452 pg/mL (ref 211–911)

## 2021-09-17 LAB — CBC WITH DIFFERENTIAL/PLATELET
Basophils Absolute: 0 10*3/uL (ref 0.0–0.1)
Basophils Relative: 0.8 % (ref 0.0–3.0)
Eosinophils Absolute: 0.3 10*3/uL (ref 0.0–0.7)
Eosinophils Relative: 5 % (ref 0.0–5.0)
HCT: 44.7 % (ref 39.0–52.0)
Hemoglobin: 13.9 g/dL (ref 13.0–17.0)
Lymphocytes Relative: 29.8 % (ref 12.0–46.0)
Lymphs Abs: 1.8 10*3/uL (ref 0.7–4.0)
MCHC: 31.1 g/dL (ref 30.0–36.0)
MCV: 76.3 fl — ABNORMAL LOW (ref 78.0–100.0)
Monocytes Absolute: 0.5 10*3/uL (ref 0.1–1.0)
Monocytes Relative: 8.6 % (ref 3.0–12.0)
Neutro Abs: 3.4 10*3/uL (ref 1.4–7.7)
Neutrophils Relative %: 55.8 % (ref 43.0–77.0)
Platelets: 212 10*3/uL (ref 150.0–400.0)
RBC: 5.85 Mil/uL — ABNORMAL HIGH (ref 4.22–5.81)
RDW: 17.6 % — ABNORMAL HIGH (ref 11.5–15.5)
WBC: 6 10*3/uL (ref 4.0–10.5)

## 2021-09-17 LAB — URINALYSIS, ROUTINE W REFLEX MICROSCOPIC
Bilirubin Urine: NEGATIVE
Hgb urine dipstick: NEGATIVE
Leukocytes,Ua: NEGATIVE
Nitrite: NEGATIVE
RBC / HPF: NONE SEEN (ref 0–?)
Specific Gravity, Urine: 1.02 (ref 1.000–1.030)
Total Protein, Urine: NEGATIVE
Urine Glucose: NEGATIVE
Urobilinogen, UA: 0.2 (ref 0.0–1.0)
pH: 7 (ref 5.0–8.0)

## 2021-09-17 LAB — HEPATIC FUNCTION PANEL
ALT: 13 U/L (ref 0–53)
AST: 16 U/L (ref 0–37)
Albumin: 4.3 g/dL (ref 3.5–5.2)
Alkaline Phosphatase: 65 U/L (ref 39–117)
Bilirubin, Direct: 0.1 mg/dL (ref 0.0–0.3)
Total Bilirubin: 0.3 mg/dL (ref 0.2–1.2)
Total Protein: 7.4 g/dL (ref 6.0–8.3)

## 2021-09-17 LAB — BASIC METABOLIC PANEL
BUN: 11 mg/dL (ref 6–23)
CO2: 25 mEq/L (ref 19–32)
Calcium: 9.2 mg/dL (ref 8.4–10.5)
Chloride: 103 mEq/L (ref 96–112)
Creatinine, Ser: 1.04 mg/dL (ref 0.40–1.50)
GFR: 86.67 mL/min (ref >60.00)
Glucose, Bld: 83 mg/dL (ref 70–99)
Potassium: 4.1 mEq/L (ref 3.5–5.1)
Sodium: 138 mEq/L (ref 135–145)

## 2021-09-17 LAB — VITAMIN D 25 HYDROXY (VIT D DEFICIENCY, FRACTURES): VITD: 15.48 ng/mL — ABNORMAL LOW (ref 30.00–100.00)

## 2021-09-17 LAB — URIC ACID: Uric Acid, Serum: 4.8 mg/dL (ref 4.0–7.8)

## 2021-09-17 LAB — TESTOSTERONE: Testosterone: 770.7 ng/dL (ref 300.00–890.00)

## 2021-09-17 LAB — HEMOGLOBIN A1C: Hgb A1c MFr Bld: 5.6 % (ref 4.6–6.5)

## 2021-09-17 LAB — TSH: TSH: 1.74 u[IU]/mL (ref 0.35–5.50)

## 2021-09-17 LAB — CK: Total CK: 237 U/L — ABNORMAL HIGH (ref 7–232)

## 2021-09-17 LAB — PSA: PSA: 0.32 ng/mL (ref 0.10–4.00)

## 2021-09-17 MED ORDER — ALLOPURINOL 100 MG PO TABS: 200.0000 mg | ORAL_TABLET | Freq: Every day | ORAL | 3 refills | Status: DC

## 2021-09-17 NOTE — Assessment & Plan Note (Signed)
Also for f/u lab level to day

## 2021-09-17 NOTE — Assessment & Plan Note (Signed)
asympt recently though out of allopurinol for 2 mo - for uric acid and restart med,  to f/u any worsening symptoms or concerns

## 2021-09-17 NOTE — Progress Notes (Signed)
Patient ID: Jason Hogan, male   DOB: Jun 03, 1975, 46 y.o.   MRN: [id number]         Chief Complaint:: wellness exam and low vit d, mixed HLD, gout, htn       HPI:  Jason Hogan is a 46 y.o. male here for wellness exam; due for colonoscopy, o/w up to date                        Also BP has been on the lower side recently but better with reduced med per cardiology.  Had unusual elevated TGs recently, not yet started vascepa per cardiology, has been working on stricter low fat diet, lost several more lbs.  Pt denies chest pain, increased sob or doe, wheezing, orthopnea, PND, increased LE swelling, palpitations, dizziness or syncope.   Pt denies polydipsia, polyuria, or new focal neuro s/s.   Wt Readings from Last 3 Encounters:  09/17/21 223 lb (101.2 kg)  08/13/21 226 lb (102.5 kg)  06/04/21 214 lb (97.1 kg)   BP Readings from Last 3 Encounters:  09/17/21 102/70  08/13/21 124/80  06/04/21 101/72   Immunization History  Administered Date(s) Administered   Influenza,inj,Quad PF,6+ Mos 10/14/2013, 11/17/2014, 10/24/2015, 12/13/2016, 11/15/2017, 10/26/2019   Influenza-Unspecified 09/18/2012, 12/20/2020   Moderna Sars-Covid-2 Vaccination 04/22/2019, 05/24/2019, 12/22/2019   Pneumococcal Polysaccharide-23 02/18/2009   Tdap 10/26/2019   Health Maintenance Due  Topic Date Due   COLONOSCOPY (Pts 45-61yrs Insurance coverage will need to be confirmed)  Never done      Past Medical History:  Diagnosis Date   Acute Myocardial Infarction 08/18/2009   Qualifier: Diagnosis of  By: Zachery Dauer, Kimalexis     Allergic rhinitis, cause unspecified 10/03/2011   Anxiety 10/03/2011   Asthma 09/20/2006   Qualifier: Diagnosis of  By: Jonny Ruiz MD, Len Blalock    Automatic implantable cardiac defibrillator in situ 01/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 Qualifier: Diagnosis of  By: Eden Emms, MD, Harrington Challenger    CAD (coronary artery disease)    a. large anterior MI 2011 s/p PTCA/DES to 100%. b.  occluded LAD with occluded collateralized silent RCA, balloon angioplasty to stented segment of LAD and DES x1 to mid circumflex in 10/2017.   CHF (congestive heart failure) (HCC)    Cholelithiasis 06/22/2007   Qualifier: Diagnosis of  By: Maris Berger    Chronic systolic heart failure (HCC) 09/18/2009   Qualifier: Diagnosis of  By: Kem Parkinson     Depressive disorder, not elsewhere classified 06/22/2007   Qualifier: Diagnosis of  By: Maris Berger    History of gout    History of kidney stones    Hyperlipidemia 81/2011   Hypertension    Impaired glucose tolerance 10/05/2012   Insomnia, unspecified 06/22/2007   Qualifier: Diagnosis of  By: Jonny Ruiz MD, Len Blalock    Ischemic cardiomyopathy 10/20/2009   Qualifier: Diagnosis of  By: Graciela Husbands, MD, Susie Cassette    Low testosterone 10/14/2013   Migraine Headache 09/18/2009   Qualifier: Diagnosis of  By: Kem Parkinson     Renal calculus    Renal Insufficiency 09/18/2009   Qualifier: Diagnosis of  By: Kem Parkinson     Respiratory failure (HCC) 08/2009   Rhabdomyolysis 09/18/2009   Qualifier: Diagnosis of  By: Kem Parkinson     VT (ventricular tachycardia) [facility])    Past Surgical History:  Procedure Laterality Date   CARDIAC CATHETERIZATION N/A 05/17/2015   Procedure: Left Heart  Cath and Coronary Angiography;  Surgeon: Wendall Stade, MD;  Location: [facility] INVASIVE CV LAB;  Service: Cardiovascular;  Laterality: N/A;   CARDIAC CATHETERIZATION  2012   CORONARY ANGIOPLASTY WITH STENT PLACEMENT  2011   CORONARY STENT INTERVENTION N/A 11/14/2017   Procedure: CORONARY STENT INTERVENTION;  Surgeon: Kathleene Hazel, MD;  Location: [facility] INVASIVE CV LAB;  Service: Cardiovascular;  Laterality: N/A;   ICD GENERATOR CHANGEOUT N/A 06/04/2021   Procedure: ICD GENERATOR CHANGEOUT;  Surgeon: Duke Salvia, MD;  Location: [facility] INVASIVE CV LAB;  Service: Cardiovascular;  Laterality: N/A;   ICD IMPLANT  11/2009   "w/pacing  function"   RIGHT/LEFT HEART CATH AND CORONARY ANGIOGRAPHY N/A 11/14/2017   Procedure: RIGHT/LEFT HEART CATH AND CORONARY ANGIOGRAPHY;  Surgeon: Kathleene Hazel, MD;  Location: [facility] INVASIVE CV LAB;  Service: Cardiovascular;  Laterality: N/A;    reports that he quit smoking about 27 years ago. His smoking use included cigarettes. He has a 0.20 pack-year smoking history. He has never used smokeless tobacco. He reports that he does not currently use alcohol. He reports that he does not use drugs. family history includes Diabetes in an other family member; Heart attack in his father; Heart disease in an other family member; Hypothyroidism in an other family member. Allergies  Allergen Reactions   Levaquin [Levofloxacin] Other (See Comments)    Joint pain   Ace Inhibitors Cough   Crestor [Rosuvastatin] Other (See Comments)    "Sweet cravings"   Lunesta [Eszopiclone] Other (See Comments)    "Bitter taste in mouth"   Zocor [Simvastatin] Other (See Comments)    Memory problem   Current Outpatient Medications on File Prior to Visit  Medication Sig Dispense Refill   albuterol (VENTOLIN HFA) 108 (90 Base) MCG/ACT inhaler Inhale 1-2 puffs into the lungs every 6 (six) hours as needed for wheezing or shortness of breath. 18 g 11   ALPRAZolam (XANAX) 0.5 MG tablet TAKE ONE TABLET BY MOUTH DAILY AS NEEDED FOR ANXIETY 30 tablet 5   aspirin EC 81 MG tablet Take 81 mg by mouth daily.     azelastine (ASTELIN) 0.1 % nasal spray Place 1 spray into both nostrils daily as needed for rhinitis.     budesonide-formoterol (SYMBICORT) 80-4.5 MCG/ACT inhaler Inhale 2 puffs into the lungs daily as needed (wheezing).     carvedilol (COREG) 12.5 MG tablet Take 1 tablet (12.5 mg total) by mouth 2 (two) times daily. 180 tablet 3   clopidogrel (PLAVIX) 75 MG tablet Take 1 tablet (75 mg total) by mouth daily. NEEDS TO KEEP FOLLOW UP APPOINTMENT 60 tablet 0   dapagliflozin propanediol (FARXIGA) 10 MG TABS tablet Take 1  tablet (10 mg total) by mouth daily before breakfast. 30 tablet 11   Dulaglutide (TRULICITY) 1.5 MG/0.5ML SOPN Inject 1.5 mg into the skin once a week. Fridays     ezetimibe (ZETIA) 10 MG tablet Take 1 tablet (10 mg total) by mouth daily. 30 tablet 11   icosapent Ethyl (VASCEPA) 1 g capsule Take 2 capsules (2 g total) by mouth 2 (two) times daily. 120 capsule 6   metolazone (ZAROXOLYN) 5 MG tablet TAKE 1 TABLET (5 MG TOTAL) BY MOUTH DAILY AS NEEDED (FOR EDEMA). 30 tablet 9   mexiletine (MEXITIL) 200 MG capsule TAKE ONE CAPSULE BY MOUTH TWICE A DAY 180 capsule 1   ondansetron (ZOFRAN) 4 MG tablet Take 1 tablet (4 mg total) by mouth every 8 (eight) hours as needed for nausea or vomiting. 20  tablet 0   potassium chloride SA (KLOR-CON M) 20 MEQ tablet Take 20 mEq by mouth daily as needed (when taking torsemide).     REPATHA SURECLICK 140 MG/ML SOAJ INJECT ONE PEN UNDER THE SKIN EVERY 14 DAYS 2 mL 11   sacubitril-valsartan (ENTRESTO) 97-103 MG Take 1 tablet by mouth 2 (two) times daily. 60 tablet 11   sertraline (ZOLOFT) 50 MG tablet TAKE ONE TABLET BY MOUTH DAILY 90 tablet 3   spironolactone (ALDACTONE) 25 MG tablet TAKE ONE TABLET BY MOUTH DAILY 30 tablet 3   tiZANidine (ZANAFLEX) 4 MG tablet TAKE ONE TABLET BY MOUTH TWICE A DAY AS NEEDED 60 tablet 2   torsemide (DEMADEX) 20 MG tablet Take 1 tablet (20 mg total) by mouth daily as needed. 30 tablet 2   No current facility-administered medications on file prior to visit.        ROS:  All others reviewed and negative.  Objective        PE:  BP 102/70 (BP Location: Right Arm, Patient Position: Sitting, Cuff Size: Large)   Pulse 82   Temp 98.5 F (36.9 C) (Oral)   Ht 6\' 2"  (1.88 m)   Wt 223 lb (101.2 kg)   SpO2 97%   BMI 28.63 kg/m                 Constitutional: Pt appears in NAD               HENT: Head: NCAT.                Right Ear: External ear normal.                 Left Ear: External ear normal.                Eyes: . Pupils are  equal, round, and reactive to light. Conjunctivae and EOM are normal               Nose: without d/c or deformity               Neck: Neck supple. Gross normal ROM               Cardiovascular: Normal rate and regular rhythm.                 Pulmonary/Chest: Effort normal and breath sounds without rales or wheezing.                Abd:  Soft, NT, ND, + BS, no organomegaly               Neurological: Pt is alert. At baseline orientation, motor grossly intact               Skin: Skin is warm. No rashes, no other new lesions, LE edema - none               Psychiatric: Pt behavior is normal without agitation   Micro: none  Cardiac tracings I have personally interpreted today:  none  Pertinent Radiological findings (summarize): none   Lab Results  Component Value Date   WBC 9.4 06/01/2021   HGB 14.2 06/01/2021   HCT 45.6 06/01/2021   PLT 226 06/01/2021   GLUCOSE 90 08/13/2021   CHOL 113 08/13/2021   TRIG 428 (H) 08/13/2021   HDL 31 (L) 08/13/2021   LDLDIRECT 54.9 08/13/2021   LDLCALC UNABLE TO CALCULATE IF TRIGLYCERIDE OVER 400 mg/dL 08/15/2021   ALT  9 03/06/2021   AST 10 (L) 03/06/2021   NA 136 08/13/2021   K 5.0 08/13/2021   CL 109 08/13/2021   CREATININE 0.93 08/13/2021   BUN 12 08/13/2021   CO2 22 08/13/2021   TSH 1.38 05/02/2020   PSA 0.45 05/02/2020   INR 1.1 (H) 08/01/2016   HGBA1C 5.7 05/02/2020   Assessment/Plan:  JANDIEL MAGALLANES is a 46 y.o. Other or two or more races [6] male with  has a past medical history of Acute Myocardial Infarction (08/18/2009), Allergic rhinitis, cause unspecified (10/03/2011), Anxiety (10/03/2011), Asthma (09/20/2006), Automatic implantable cardiac defibrillator in situ (01/17/2010), CAD (coronary artery disease), CHF (congestive heart failure) (HCC), Cholelithiasis (06/22/2007), Chronic systolic heart failure (HCC) (08/24/7339), Depressive disorder, not elsewhere classified (06/22/2007), History of gout, History of kidney stones, Hyperlipidemia  (81/2011), Hypertension, Impaired glucose tolerance (10/05/2012), Insomnia, unspecified (06/22/2007), Ischemic cardiomyopathy (10/20/2009), Low testosterone (10/14/2013), Migraine Headache (09/18/2009), Renal calculus, Renal Insufficiency (09/18/2009), Respiratory failure (HCC) (08/2009), Rhabdomyolysis (09/18/2009), and VT (ventricular tachycardia) (HCC).  Vitamin D deficiency Last vitamin D Lab Results  Component Value Date   VD25OH 18.81 (L) 05/02/2020   Low, to start oral replacement   Encounter for well adult exam with abnormal findings Age and sex appropriate education and counseling updated with regular exercise and diet Referrals for preventative services - for colonoscopy Immunizations addressed - none needed Smoking counseling  - none needed Evidence for depression or other mood disorder - none significant Most recent labs reviewed. I have personally reviewed and have noted: 1) the patient's medical and social history 2) The patient's current medications and supplements 3) The patient's height, weight, and BMI have been recorded in the chart   Impaired glucose tolerance Lab Results  Component Value Date   HGBA1C 5.7 05/02/2020   Stable, pt to continue current medical treatment farxiga 10 mg qd  Hypogonadism in male Also for f/u lab level to day  Hypertension BP Readings from Last 3 Encounters:  09/17/21 102/70  08/13/21 124/80  06/04/21 101/72   Low normal but tolerating for now, pt to continue medical treatment coreg 12.5 bid, entresto 97-103 bid as per cardiology  Hyperlipidemia Lab Results  Component Value Date   LDLCALC UNABLE TO CALCULATE IF TRIGLYCERIDE OVER 400 mg/dL 93/79/0240   With recent severe elev TG for unclear reason, pt has not started vascepa yet, cont zetia and check lipids today, pt has been statin intolerant in past   Gout asympt recently though out of allopurinol for 2 mo - for uric acid and restart med,  to f/u any worsening symptoms or  concerns  Followup: Return in about 1 year (around 09/18/2022).  Oliver Barre, MD 09/17/2021 9:40 AM [facility] - [facility] Internal Medicine

## 2021-09-17 NOTE — Patient Instructions (Addendum)
Please continue all other medications as before, and refills have been done if requested- the allopurinol.  Please have the pharmacy call with any other refills you may need.  Please continue your efforts at being more active, low cholesterol diet, and weight control.  You are otherwise up to date with prevention measures today.  Please keep your appointments with your specialists as you may have planned - cardiology  Please go to the LAB at the blood drawing area for the tests to be done  You will be contacted by phone if any changes need to be made immediately.  Otherwise, you will receive a letter about your results with an explanation, but please check with MyChart first.  Please remember to sign up for MyChart if you have not done so, as this will be important to you in the future with finding out test results, communicating by private email, and scheduling acute appointments online when needed.  Please make an Appointment to return for your 1 year visit, or sooner if needed

## 2021-09-17 NOTE — Assessment & Plan Note (Signed)

## 2021-09-17 NOTE — Assessment & Plan Note (Signed)
Last vitamin D Lab Results  Component Value Date   VD25OH 18.81 (L) 05/02/2020   Low, to start oral replacement

## 2021-09-17 NOTE — Assessment & Plan Note (Signed)
Lab Results  Component Value Date   HGBA1C 5.7 05/02/2020   Stable, pt to continue current medical treatment farxiga 10 mg qd

## 2021-09-17 NOTE — Assessment & Plan Note (Signed)
BP Readings from Last 3 Encounters:  09/17/21 102/70  08/13/21 124/80  06/04/21 101/72   Low normal but tolerating for now, pt to continue medical treatment coreg 12.5 bid, entresto 97-103 bid as per cardiology

## 2021-09-17 NOTE — Assessment & Plan Note (Signed)
Lab Results  Component Value Date   LDLCALC UNABLE TO CALCULATE IF TRIGLYCERIDE OVER 400 mg/dL 60/05/5995   With recent severe elev TG for unclear reason, pt has not started vascepa yet, cont zetia and check lipids today, pt has been statin intolerant in past

## 2021-09-23 ENCOUNTER — Other Ambulatory Visit: Payer: Self-pay | Admitting: Internal Medicine

## 2021-09-23 ENCOUNTER — Other Ambulatory Visit (HOSPITAL_COMMUNITY): Payer: Self-pay | Admitting: Cardiology

## 2021-09-29 DIAGNOSIS — T148XXA Other injury of unspecified body region, initial encounter: Secondary | ICD-10-CM | POA: Diagnosis not present

## 2021-10-01 ENCOUNTER — Other Ambulatory Visit (HOSPITAL_COMMUNITY): Payer: Self-pay | Admitting: Cardiology

## 2021-10-02 NOTE — Progress Notes (Signed)
Remote ICD transmission.   

## 2021-10-31 ENCOUNTER — Other Ambulatory Visit (HOSPITAL_COMMUNITY): Payer: Self-pay | Admitting: Cardiology

## 2021-11-09 DIAGNOSIS — J019 Acute sinusitis, unspecified: Secondary | ICD-10-CM | POA: Diagnosis not present

## 2021-11-13 ENCOUNTER — Encounter (HOSPITAL_COMMUNITY): Payer: BC Managed Care – PPO

## 2021-11-29 ENCOUNTER — Other Ambulatory Visit (HOSPITAL_COMMUNITY): Payer: Self-pay | Admitting: Cardiology

## 2021-11-30 ENCOUNTER — Other Ambulatory Visit (HOSPITAL_COMMUNITY): Payer: Self-pay

## 2021-11-30 DIAGNOSIS — I5022 Chronic systolic (congestive) heart failure: Secondary | ICD-10-CM

## 2021-11-30 NOTE — Progress Notes (Signed)
Orders Placed This Encounter  Procedures   ECHOCARDIOGRAM COMPLETE    Standing Status:   Future    Standing Expiration Date:   12/01/2022    Order Specific Question:   Where should this test be performed    Answer:   [facility]    Order Specific Question:   Perflutren DEFINITY (image enhancing agent) should be administered unless hypersensitivity or allergy exist    Answer:   Administer Perflutren    Order Specific Question:   Reason for exam-Echo    Answer:   Congestive Heart Failure  I50.9    Order Specific Question:   Release to patient    Answer:   Immediate

## 2021-12-04 ENCOUNTER — Ambulatory Visit (INDEPENDENT_AMBULATORY_CARE_PROVIDER_SITE_OTHER): Payer: BC Managed Care – PPO

## 2021-12-04 DIAGNOSIS — I255 Ischemic cardiomyopathy: Secondary | ICD-10-CM | POA: Diagnosis not present

## 2021-12-04 LAB — CUP PACEART REMOTE DEVICE CHECK
Battery Remaining Longevity: 94 mo
Battery Remaining Percentage: 92 %
Battery Voltage: 3.16 V
Brady Statistic RV Percent Paced: 0 %
Date Time Interrogation Session: 20231017020016
HighPow Impedance: 49 Ohm
HighPow Impedance: 49 Ohm
Implantable Lead Implant Date: 20111010
Implantable Lead Location: 753860
Implantable Lead Model: 7121
Implantable Pulse Generator Implant Date: 20230417
Lead Channel Impedance Value: 1050 Ohm
Lead Channel Pacing Threshold Amplitude: 2.5 V
Lead Channel Pacing Threshold Pulse Width: 1 ms
Lead Channel Sensing Intrinsic Amplitude: 11.5 mV
Lead Channel Setting Pacing Amplitude: 2.75 V
Lead Channel Setting Pacing Pulse Width: 1 ms
Lead Channel Setting Sensing Sensitivity: 0.5 mV
Pulse Gen Serial Number: 8926353

## 2021-12-12 ENCOUNTER — Ambulatory Visit (HOSPITAL_BASED_OUTPATIENT_CLINIC_OR_DEPARTMENT_OTHER)
Admission: RE | Admit: 2021-12-12 | Discharge: 2021-12-12 | Disposition: A | Discharge status: Home / Self Care | Payer: BC Managed Care – PPO | Source: Ambulatory Visit | Attending: Cardiology | Admitting: Cardiology

## 2021-12-12 ENCOUNTER — Ambulatory Visit (HOSPITAL_COMMUNITY)
Admission: RE | Admit: 2021-12-12 | Discharge: 2021-12-12 | Disposition: A | Discharge status: Home / Self Care | Payer: BC Managed Care – PPO | Source: Ambulatory Visit | Attending: Cardiology | Admitting: Cardiology

## 2021-12-12 VITALS — BP 110/72 | HR 75 | Wt 229.8 lb

## 2021-12-12 DIAGNOSIS — Z79899 Other long term (current) drug therapy: Secondary | ICD-10-CM | POA: Insufficient documentation

## 2021-12-12 DIAGNOSIS — E785 Hyperlipidemia, unspecified: Secondary | ICD-10-CM | POA: Diagnosis not present

## 2021-12-12 DIAGNOSIS — Z7902 Long term (current) use of antithrombotics/antiplatelets: Secondary | ICD-10-CM | POA: Diagnosis not present

## 2021-12-12 DIAGNOSIS — Z955 Presence of coronary angioplasty implant and graft: Secondary | ICD-10-CM | POA: Insufficient documentation

## 2021-12-12 DIAGNOSIS — J45909 Unspecified asthma, uncomplicated: Secondary | ICD-10-CM | POA: Insufficient documentation

## 2021-12-12 DIAGNOSIS — D751 Secondary polycythemia: Secondary | ICD-10-CM | POA: Insufficient documentation

## 2021-12-12 DIAGNOSIS — I493 Ventricular premature depolarization: Secondary | ICD-10-CM | POA: Insufficient documentation

## 2021-12-12 DIAGNOSIS — I255 Ischemic cardiomyopathy: Secondary | ICD-10-CM | POA: Diagnosis not present

## 2021-12-12 DIAGNOSIS — I5022 Chronic systolic (congestive) heart failure: Secondary | ICD-10-CM | POA: Diagnosis not present

## 2021-12-12 DIAGNOSIS — Z95810 Presence of automatic (implantable) cardiac defibrillator: Secondary | ICD-10-CM | POA: Insufficient documentation

## 2021-12-12 DIAGNOSIS — I252 Old myocardial infarction: Secondary | ICD-10-CM | POA: Diagnosis not present

## 2021-12-12 DIAGNOSIS — F419 Anxiety disorder, unspecified: Secondary | ICD-10-CM | POA: Diagnosis not present

## 2021-12-12 DIAGNOSIS — I110 Hypertensive heart disease with heart failure: Secondary | ICD-10-CM | POA: Diagnosis not present

## 2021-12-12 DIAGNOSIS — I2510 Atherosclerotic heart disease of native coronary artery without angina pectoris: Secondary | ICD-10-CM | POA: Diagnosis not present

## 2021-12-12 LAB — BASIC METABOLIC PANEL
Anion gap: 5 (ref 5–15)
BUN: 13 mg/dL (ref 6–20)
CO2: 26 mmol/L (ref 22–32)
Calcium: 8.8 mg/dL — ABNORMAL LOW (ref 8.9–10.3)
Chloride: 107 mmol/L (ref 98–111)
Creatinine, Ser: 0.91 mg/dL (ref 0.61–1.24)
GFR, Estimated: >60 mL/min (ref >60)
Glucose, Bld: 91 mg/dL (ref 70–99)
Potassium: 4.3 mmol/L (ref 3.5–5.1)
Sodium: 138 mmol/L (ref 135–145)

## 2021-12-12 LAB — BRAIN NATRIURETIC PEPTIDE: B Natriuretic Peptide: 59.5 pg/mL (ref 0.0–100.0)

## 2021-12-12 LAB — ECHOCARDIOGRAM COMPLETE
Area-P 1/2: 5.06 cm2
Calc EF: 34.6 %
S' Lateral: 4.5 cm
Single Plane A2C EF: 50.2 %
Single Plane A4C EF: 22.8 %

## 2021-12-12 MED ORDER — PERFLUTREN LIPID MICROSPHERE
1.0000 mL | INTRAVENOUS | Status: DC | PRN
  Administered 2021-12-12: 3 mL via INTRAVENOUS
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
## 2021-12-12 NOTE — Patient Instructions (Signed)
There has been no changes to your medications.  Labs done today, your results will be available in MyChart, we will contact you for abnormal readings.  Your physician recommends that you schedule a follow-up appointment in: 4 months (February 2024)  ** please call the office in December to arrange your follow up appointment **  If you have any questions or concerns before your next appointment please send us a message through mychart or call our office at [phone].    TO LEAVE A MESSAGE FOR THE NURSE SELECT OPTION 2, PLEASE LEAVE A MESSAGE INCLUDING: YOUR NAME DATE OF BIRTH CALL BACK NUMBER REASON FOR CALL**this is important as we prioritize the call backs  YOU WILL RECEIVE A CALL BACK THE SAME DAY AS LONG AS YOU CALL BEFORE 4:00 PM  At the [facility], you and your health needs are our priority. As part of our continuing mission to provide you with exceptional heart care, we have created designated Provider Care Teams. These Care Teams include your primary Cardiologist (physician) and Advanced Practice Providers (APPs- Physician Assistants and Nurse Practitioners) who all work together to provide you with the care you need, when you need it.   You may see any of the following providers on your designated Care Team at your next follow up: Dr Daniel Bensimhon Dr Dalton McLean Dr. Aditya Sabharwal Amy Clegg, NP Brittainy Simmons, PA Jessica Milford,NP Lindsay Finch, PA Alma Diaz, NP Lauren Kemp, PharmD   Please be sure to bring in all your medications bottles to every appointment.    

## 2021-12-18 ENCOUNTER — Other Ambulatory Visit (HOSPITAL_COMMUNITY): Payer: Self-pay | Admitting: Cardiology

## 2021-12-19 NOTE — Progress Notes (Signed)
Remote ICD transmission.   

## 2022-02-08 DIAGNOSIS — G4483 Primary cough headache: Secondary | ICD-10-CM | POA: Diagnosis not present

## 2022-02-08 DIAGNOSIS — J4520 Mild intermittent asthma, uncomplicated: Secondary | ICD-10-CM | POA: Diagnosis not present

## 2022-02-08 DIAGNOSIS — J00 Acute nasopharyngitis [common cold]: Secondary | ICD-10-CM | POA: Diagnosis not present

## 2022-02-08 DIAGNOSIS — I10 Essential (primary) hypertension: Secondary | ICD-10-CM | POA: Diagnosis not present

## 2022-02-08 DIAGNOSIS — Z23 Encounter for immunization: Secondary | ICD-10-CM | POA: Diagnosis not present

## 2022-03-04 ENCOUNTER — Encounter (HOSPITAL_COMMUNITY): Payer: Self-pay | Admitting: Cardiology

## 2022-03-04 ENCOUNTER — Encounter: Payer: Self-pay | Admitting: Internal Medicine

## 2022-03-04 MED ORDER — SERTRALINE HCL 100 MG PO TABS: 100.0000 mg | ORAL_TABLET | Freq: Every day | ORAL | 2 refills | Status: DC

## 2022-03-04 MED ORDER — REPATHA SURECLICK 140 MG/ML ~~LOC~~ SOAJ: 1.0000 mL | SUBCUTANEOUS | 11 refills | Status: DC

## 2022-03-04 NOTE — Telephone Encounter (Signed)
Please advise,LOV 09/17/21

## 2022-03-05 ENCOUNTER — Ambulatory Visit: Payer: BC Managed Care – PPO | Attending: Internal Medicine

## 2022-03-05 DIAGNOSIS — I255 Ischemic cardiomyopathy: Secondary | ICD-10-CM | POA: Diagnosis not present

## 2022-03-05 LAB — CUP PACEART REMOTE DEVICE CHECK
Battery Remaining Longevity: 92 mo
Battery Remaining Percentage: 90 %
Battery Voltage: 3.13 V
Brady Statistic RV Percent Paced: 1 %
Date Time Interrogation Session: 20240116020016
HighPow Impedance: 48 Ohm
HighPow Impedance: 48 Ohm
Implantable Lead Connection Status: 753985
Implantable Lead Implant Date: 20111010
Implantable Lead Location: 753860
Implantable Lead Model: 7121
Implantable Pulse Generator Implant Date: 20230417
Lead Channel Impedance Value: 1050 Ohm
Lead Channel Pacing Threshold Amplitude: 2.5 V
Lead Channel Pacing Threshold Pulse Width: 1 ms
Lead Channel Sensing Intrinsic Amplitude: 11.5 mV
Lead Channel Setting Pacing Amplitude: 2.75 V
Lead Channel Setting Pacing Pulse Width: 1 ms
Lead Channel Setting Sensing Sensitivity: 0.5 mV
Pulse Gen Serial Number: 8926353

## 2022-03-20 ENCOUNTER — Other Ambulatory Visit (HOSPITAL_COMMUNITY): Payer: Self-pay

## 2022-03-29 NOTE — Progress Notes (Signed)
Remote ICD transmission.   

## 2022-04-05 ENCOUNTER — Other Ambulatory Visit (HOSPITAL_COMMUNITY): Payer: Self-pay | Admitting: Cardiology

## 2022-04-12 ENCOUNTER — Other Ambulatory Visit (HOSPITAL_COMMUNITY): Payer: Self-pay | Admitting: Cardiology

## 2022-05-05 ENCOUNTER — Other Ambulatory Visit (HOSPITAL_COMMUNITY): Payer: Self-pay | Admitting: Cardiology

## 2022-05-09 ENCOUNTER — Other Ambulatory Visit (HOSPITAL_COMMUNITY): Payer: Self-pay | Admitting: Cardiology

## 2022-05-15 ENCOUNTER — Other Ambulatory Visit (HOSPITAL_COMMUNITY): Payer: Self-pay | Admitting: Cardiology

## 2022-06-04 ENCOUNTER — Ambulatory Visit (INDEPENDENT_AMBULATORY_CARE_PROVIDER_SITE_OTHER): Payer: BC Managed Care – PPO

## 2022-06-04 DIAGNOSIS — I255 Ischemic cardiomyopathy: Secondary | ICD-10-CM

## 2022-06-05 LAB — CUP PACEART REMOTE DEVICE CHECK
Battery Remaining Longevity: 91 mo
Battery Remaining Percentage: 89 %
Battery Voltage: 3.1 V
Brady Statistic RV Percent Paced: 1 %
Date Time Interrogation Session: 20240416020017
HighPow Impedance: 48 Ohm
HighPow Impedance: 48 Ohm
Implantable Lead Connection Status: 753985
Implantable Lead Implant Date: 20111010
Implantable Lead Location: 753860
Implantable Lead Model: 7121
Implantable Pulse Generator Implant Date: 20230417
Lead Channel Impedance Value: 1000 Ohm
Lead Channel Pacing Threshold Amplitude: 2.5 V
Lead Channel Pacing Threshold Pulse Width: 1 ms
Lead Channel Sensing Intrinsic Amplitude: 11.5 mV
Lead Channel Setting Pacing Amplitude: 2.75 V
Lead Channel Setting Pacing Pulse Width: 1 ms
Lead Channel Setting Sensing Sensitivity: 0.5 mV
Pulse Gen Serial Number: 8926353

## 2022-06-08 ENCOUNTER — Other Ambulatory Visit (HOSPITAL_COMMUNITY): Payer: Self-pay | Admitting: Cardiology

## 2022-06-15 ENCOUNTER — Other Ambulatory Visit (HOSPITAL_COMMUNITY): Payer: Self-pay | Admitting: Cardiology

## 2022-06-17 ENCOUNTER — Other Ambulatory Visit (HOSPITAL_COMMUNITY): Payer: Self-pay | Admitting: Cardiology

## 2022-07-08 NOTE — Progress Notes (Signed)
Remote ICD transmission.   

## 2022-08-07 ENCOUNTER — Other Ambulatory Visit (HOSPITAL_COMMUNITY): Payer: Self-pay | Admitting: Cardiology

## 2022-08-09 ENCOUNTER — Other Ambulatory Visit: Payer: Self-pay | Admitting: Internal Medicine

## 2022-08-09 ENCOUNTER — Other Ambulatory Visit (HOSPITAL_COMMUNITY): Payer: Self-pay | Admitting: Cardiology

## 2022-08-17 ENCOUNTER — Other Ambulatory Visit (HOSPITAL_COMMUNITY): Payer: Self-pay | Admitting: Cardiology

## 2022-08-19 ENCOUNTER — Other Ambulatory Visit (HOSPITAL_COMMUNITY): Payer: Self-pay

## 2022-08-19 ENCOUNTER — Other Ambulatory Visit (HOSPITAL_COMMUNITY): Payer: Self-pay | Admitting: Cardiology

## 2022-08-19 MED ORDER — VASCEPA 1 G PO CAPS: 2.0000 g | ORAL_CAPSULE | Freq: Two times a day (BID) | ORAL | 6 refills | Status: DC

## 2022-08-19 NOTE — Telephone Encounter (Signed)
Insurance requires BRAND NAME vascpa per Marisue Ivan with pharm team -script sent

## 2022-08-29 ENCOUNTER — Other Ambulatory Visit (HOSPITAL_COMMUNITY): Payer: Self-pay

## 2022-09-03 ENCOUNTER — Ambulatory Visit (INDEPENDENT_AMBULATORY_CARE_PROVIDER_SITE_OTHER): Payer: BC Managed Care – PPO

## 2022-09-03 DIAGNOSIS — I255 Ischemic cardiomyopathy: Secondary | ICD-10-CM | POA: Diagnosis not present

## 2022-09-05 LAB — CUP PACEART REMOTE DEVICE CHECK
Battery Remaining Longevity: 88 mo
Battery Remaining Percentage: 86 %
Battery Voltage: 3.08 V
Brady Statistic RV Percent Paced: 1 %
Date Time Interrogation Session: 20240716020018
HighPow Impedance: 47 Ohm
HighPow Impedance: 47 Ohm
Implantable Lead Connection Status: 753985
Implantable Lead Implant Date: 20111010
Implantable Lead Location: 753860
Implantable Lead Model: 7121
Implantable Pulse Generator Implant Date: 20230417
Lead Channel Impedance Value: 1025 Ohm
Lead Channel Pacing Threshold Amplitude: 2.5 V
Lead Channel Pacing Threshold Pulse Width: 1 ms
Lead Channel Sensing Intrinsic Amplitude: 11.5 mV
Lead Channel Setting Pacing Amplitude: 2.75 V
Lead Channel Setting Pacing Pulse Width: 1 ms
Lead Channel Setting Sensing Sensitivity: 0.5 mV
Pulse Gen Serial Number: 8926353

## 2022-09-16 ENCOUNTER — Other Ambulatory Visit (HOSPITAL_COMMUNITY): Payer: Self-pay | Admitting: Cardiology

## 2022-09-19 NOTE — Progress Notes (Signed)
Remote ICD transmission.   

## 2022-09-20 ENCOUNTER — Telehealth (HOSPITAL_COMMUNITY): Payer: Self-pay | Admitting: Pharmacy Technician

## 2022-09-20 ENCOUNTER — Other Ambulatory Visit (HOSPITAL_COMMUNITY): Payer: Self-pay

## 2022-09-20 NOTE — Telephone Encounter (Signed)
[facility] Patient Advocate Encounter  Prior Authorization for Vascepa (brand) has been approved.    PA# [id number] Effective dates: 09/20/22 through 09/20/23  Patients co-pay is $25 . Archer Asa, CPhT

## 2022-09-20 NOTE — Telephone Encounter (Signed)
Patient Advocate Encounter   Received notification from [facility] that prior authorization for Vascepa (brand) is required.   PA submitted on CoverMyMeds Key [id number] Status is pending   Will continue to follow.

## 2022-09-28 ENCOUNTER — Encounter: Payer: Self-pay | Admitting: Internal Medicine

## 2022-10-01 DIAGNOSIS — R51 Headache with orthostatic component, not elsewhere classified: Secondary | ICD-10-CM | POA: Diagnosis not present

## 2022-10-01 DIAGNOSIS — M7912 Myalgia of auxiliary muscles, head and neck: Secondary | ICD-10-CM | POA: Diagnosis not present

## 2022-10-01 DIAGNOSIS — M5413 Radiculopathy, cervicothoracic region: Secondary | ICD-10-CM | POA: Diagnosis not present

## 2022-10-01 DIAGNOSIS — M5412 Radiculopathy, cervical region: Secondary | ICD-10-CM | POA: Diagnosis not present

## 2022-10-04 DIAGNOSIS — R51 Headache with orthostatic component, not elsewhere classified: Secondary | ICD-10-CM | POA: Diagnosis not present

## 2022-10-04 DIAGNOSIS — M7912 Myalgia of auxiliary muscles, head and neck: Secondary | ICD-10-CM | POA: Diagnosis not present

## 2022-10-04 DIAGNOSIS — M5413 Radiculopathy, cervicothoracic region: Secondary | ICD-10-CM | POA: Diagnosis not present

## 2022-10-04 DIAGNOSIS — M5412 Radiculopathy, cervical region: Secondary | ICD-10-CM | POA: Diagnosis not present

## 2022-10-07 ENCOUNTER — Other Ambulatory Visit (HOSPITAL_COMMUNITY): Payer: Self-pay | Admitting: Cardiology

## 2022-10-09 DIAGNOSIS — R51 Headache with orthostatic component, not elsewhere classified: Secondary | ICD-10-CM | POA: Diagnosis not present

## 2022-10-09 DIAGNOSIS — M5413 Radiculopathy, cervicothoracic region: Secondary | ICD-10-CM | POA: Diagnosis not present

## 2022-10-09 DIAGNOSIS — M5412 Radiculopathy, cervical region: Secondary | ICD-10-CM | POA: Diagnosis not present

## 2022-10-09 DIAGNOSIS — M7912 Myalgia of auxiliary muscles, head and neck: Secondary | ICD-10-CM | POA: Diagnosis not present

## 2022-10-16 DIAGNOSIS — M5413 Radiculopathy, cervicothoracic region: Secondary | ICD-10-CM | POA: Diagnosis not present

## 2022-10-16 DIAGNOSIS — M7912 Myalgia of auxiliary muscles, head and neck: Secondary | ICD-10-CM | POA: Diagnosis not present

## 2022-10-16 DIAGNOSIS — R51 Headache with orthostatic component, not elsewhere classified: Secondary | ICD-10-CM | POA: Diagnosis not present

## 2022-10-16 DIAGNOSIS — M5412 Radiculopathy, cervical region: Secondary | ICD-10-CM | POA: Diagnosis not present

## 2022-10-17 ENCOUNTER — Encounter (HOSPITAL_COMMUNITY): Payer: Self-pay | Admitting: Cardiology

## 2022-10-17 ENCOUNTER — Other Ambulatory Visit (HOSPITAL_COMMUNITY): Payer: Self-pay | Admitting: Cardiology

## 2022-10-17 ENCOUNTER — Ambulatory Visit (HOSPITAL_COMMUNITY)
Admission: RE | Admit: 2022-10-17 | Discharge: 2022-10-17 | Disposition: A | Discharge status: Home / Self Care | Payer: BC Managed Care – PPO | Source: Ambulatory Visit | Attending: Cardiology | Admitting: Cardiology

## 2022-10-17 ENCOUNTER — Inpatient Hospital Stay (HOSPITAL_COMMUNITY)
Admission: RE | Admit: 2022-10-17 | Discharge: 2022-10-17 | Disposition: A | Discharge status: Home / Self Care | Payer: BC Managed Care – PPO | Source: Ambulatory Visit | Attending: Cardiology | Admitting: Cardiology

## 2022-10-17 VITALS — BP 120/90 | HR 90 | Wt 208.6 lb

## 2022-10-17 DIAGNOSIS — Z955 Presence of coronary angioplasty implant and graft: Secondary | ICD-10-CM | POA: Diagnosis not present

## 2022-10-17 DIAGNOSIS — I2510 Atherosclerotic heart disease of native coronary artery without angina pectoris: Secondary | ICD-10-CM | POA: Insufficient documentation

## 2022-10-17 DIAGNOSIS — R002 Palpitations: Secondary | ICD-10-CM | POA: Insufficient documentation

## 2022-10-17 DIAGNOSIS — M791 Myalgia, unspecified site: Secondary | ICD-10-CM | POA: Diagnosis not present

## 2022-10-17 DIAGNOSIS — I493 Ventricular premature depolarization: Secondary | ICD-10-CM

## 2022-10-17 DIAGNOSIS — F419 Anxiety disorder, unspecified: Secondary | ICD-10-CM | POA: Insufficient documentation

## 2022-10-17 DIAGNOSIS — Z7902 Long term (current) use of antithrombotics/antiplatelets: Secondary | ICD-10-CM | POA: Diagnosis not present

## 2022-10-17 DIAGNOSIS — Z79899 Other long term (current) drug therapy: Secondary | ICD-10-CM | POA: Insufficient documentation

## 2022-10-17 DIAGNOSIS — I255 Ischemic cardiomyopathy: Secondary | ICD-10-CM | POA: Diagnosis not present

## 2022-10-17 DIAGNOSIS — I5022 Chronic systolic (congestive) heart failure: Secondary | ICD-10-CM | POA: Insufficient documentation

## 2022-10-17 DIAGNOSIS — I110 Hypertensive heart disease with heart failure: Secondary | ICD-10-CM | POA: Insufficient documentation

## 2022-10-17 DIAGNOSIS — I252 Old myocardial infarction: Secondary | ICD-10-CM | POA: Diagnosis not present

## 2022-10-17 DIAGNOSIS — E785 Hyperlipidemia, unspecified: Secondary | ICD-10-CM | POA: Diagnosis not present

## 2022-10-17 MED ORDER — CARVEDILOL 12.5 MG PO TABS: 18.7500 mg | ORAL_TABLET | Freq: Two times a day (BID) | ORAL | 3 refills | Status: DC

## 2022-10-17 NOTE — Patient Instructions (Signed)
Increase Coreg to 18.75 mg twice daily (1 and 1/2 tablet twice daily). Updated Rx sent to your local pharmacy.  Return to [facility] APP Clinic in 4 months. Please wear Zio patch for 7 days for PVCs. You may place it on yourself after you return from your beach trip. Please call us at [phone] if any questions or concerns.  Your provider has recommended that  you wear a Zio Patch for 7 days.  This monitor will record your heart rhythm for our review.  IF you have any symptoms while wearing the monitor please press the button.  If you have any issues with the patch or you notice a red or orange light on it please call the company at [phone].  Once you remove the patch please mail it back to the company as soon as possible so we can get the results.  Zio patch placed onto patient.  All instructions and information reviewed with patient, they verbalize understanding with no questions.
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
## 2022-10-23 ENCOUNTER — Other Ambulatory Visit: Payer: Self-pay | Admitting: Internal Medicine

## 2022-10-23 DIAGNOSIS — M7912 Myalgia of auxiliary muscles, head and neck: Secondary | ICD-10-CM | POA: Diagnosis not present

## 2022-10-23 DIAGNOSIS — M5412 Radiculopathy, cervical region: Secondary | ICD-10-CM | POA: Diagnosis not present

## 2022-10-23 DIAGNOSIS — R51 Headache with orthostatic component, not elsewhere classified: Secondary | ICD-10-CM | POA: Diagnosis not present

## 2022-10-23 DIAGNOSIS — M5413 Radiculopathy, cervicothoracic region: Secondary | ICD-10-CM | POA: Diagnosis not present

## 2022-10-25 ENCOUNTER — Encounter (HOSPITAL_COMMUNITY): Payer: BC Managed Care – PPO | Admitting: Cardiology

## 2022-10-30 DIAGNOSIS — M5413 Radiculopathy, cervicothoracic region: Secondary | ICD-10-CM | POA: Diagnosis not present

## 2022-10-30 DIAGNOSIS — M5412 Radiculopathy, cervical region: Secondary | ICD-10-CM | POA: Diagnosis not present

## 2022-10-30 DIAGNOSIS — M7912 Myalgia of auxiliary muscles, head and neck: Secondary | ICD-10-CM | POA: Diagnosis not present

## 2022-10-30 DIAGNOSIS — R51 Headache with orthostatic component, not elsewhere classified: Secondary | ICD-10-CM | POA: Diagnosis not present

## 2022-10-31 ENCOUNTER — Encounter: Payer: Self-pay | Admitting: Cardiology

## 2022-11-01 ENCOUNTER — Encounter (HOSPITAL_COMMUNITY): Payer: Self-pay | Admitting: Cardiology

## 2022-11-06 ENCOUNTER — Other Ambulatory Visit (HOSPITAL_COMMUNITY): Payer: Self-pay | Admitting: Cardiology

## 2022-11-06 ENCOUNTER — Other Ambulatory Visit: Payer: Self-pay

## 2022-11-06 ENCOUNTER — Other Ambulatory Visit: Payer: Self-pay | Admitting: Internal Medicine

## 2022-11-06 DIAGNOSIS — M7912 Myalgia of auxiliary muscles, head and neck: Secondary | ICD-10-CM | POA: Diagnosis not present

## 2022-11-06 DIAGNOSIS — R51 Headache with orthostatic component, not elsewhere classified: Secondary | ICD-10-CM | POA: Diagnosis not present

## 2022-11-06 DIAGNOSIS — M5412 Radiculopathy, cervical region: Secondary | ICD-10-CM | POA: Diagnosis not present

## 2022-11-06 DIAGNOSIS — M5413 Radiculopathy, cervicothoracic region: Secondary | ICD-10-CM | POA: Diagnosis not present

## 2022-11-13 DIAGNOSIS — M5412 Radiculopathy, cervical region: Secondary | ICD-10-CM | POA: Diagnosis not present

## 2022-11-13 DIAGNOSIS — M5413 Radiculopathy, cervicothoracic region: Secondary | ICD-10-CM | POA: Diagnosis not present

## 2022-11-13 DIAGNOSIS — R51 Headache with orthostatic component, not elsewhere classified: Secondary | ICD-10-CM | POA: Diagnosis not present

## 2022-11-13 DIAGNOSIS — M7912 Myalgia of auxiliary muscles, head and neck: Secondary | ICD-10-CM | POA: Diagnosis not present

## 2022-11-18 ENCOUNTER — Telehealth (HOSPITAL_COMMUNITY): Payer: Self-pay | Admitting: Surgery

## 2022-11-18 NOTE — Telephone Encounter (Signed)
I attempted to reach the patient after receiving communication that his Zio monitor is "pending overdue" and has not been received by the company for interpretation.  I was unable to leave a message.

## 2022-11-22 ENCOUNTER — Telehealth (HOSPITAL_COMMUNITY): Payer: Self-pay | Admitting: Cardiology

## 2022-11-22 DIAGNOSIS — I493 Ventricular premature depolarization: Secondary | ICD-10-CM | POA: Diagnosis not present

## 2022-11-22 NOTE — Telephone Encounter (Signed)
iRhythm called to report Vtach HR 136 x 30 seconds on 09/14 @ 1029am   Message to provider as Lorain Childes

## 2022-11-27 DIAGNOSIS — M7912 Myalgia of auxiliary muscles, head and neck: Secondary | ICD-10-CM | POA: Diagnosis not present

## 2022-11-27 DIAGNOSIS — M5413 Radiculopathy, cervicothoracic region: Secondary | ICD-10-CM | POA: Diagnosis not present

## 2022-11-27 DIAGNOSIS — M5412 Radiculopathy, cervical region: Secondary | ICD-10-CM | POA: Diagnosis not present

## 2022-11-27 DIAGNOSIS — R51 Headache with orthostatic component, not elsewhere classified: Secondary | ICD-10-CM | POA: Diagnosis not present

## 2022-11-29 NOTE — Addendum Note (Signed)
Encounter addended by: Crissie Figures, RN on: 11/29/2022 3:01 PM  Actions taken: Imaging Exam ended

## 2022-12-03 ENCOUNTER — Ambulatory Visit (INDEPENDENT_AMBULATORY_CARE_PROVIDER_SITE_OTHER): Payer: BC Managed Care – PPO

## 2022-12-03 DIAGNOSIS — I255 Ischemic cardiomyopathy: Secondary | ICD-10-CM | POA: Diagnosis not present

## 2022-12-03 DIAGNOSIS — I5022 Chronic systolic (congestive) heart failure: Secondary | ICD-10-CM

## 2022-12-05 ENCOUNTER — Telehealth (HOSPITAL_COMMUNITY): Payer: Self-pay

## 2022-12-05 DIAGNOSIS — I493 Ventricular premature depolarization: Secondary | ICD-10-CM

## 2022-12-05 DIAGNOSIS — I5022 Chronic systolic (congestive) heart failure: Secondary | ICD-10-CM

## 2022-12-05 LAB — CUP PACEART REMOTE DEVICE CHECK
Battery Remaining Longevity: 86 mo
Battery Remaining Percentage: 84 %
Battery Voltage: 3.05 V
Brady Statistic RV Percent Paced: 1 %
Date Time Interrogation Session: 20241015034904
HighPow Impedance: 49 Ohm
HighPow Impedance: 50 Ohm
Implantable Lead Connection Status: 753985
Implantable Lead Implant Date: 20111010
Implantable Lead Location: 753860
Implantable Lead Model: 7121
Implantable Pulse Generator Implant Date: 20230417
Lead Channel Impedance Value: 1075 Ohm
Lead Channel Pacing Threshold Amplitude: 2.5 V
Lead Channel Pacing Threshold Pulse Width: 1 ms
Lead Channel Sensing Intrinsic Amplitude: 11.5 mV
Lead Channel Setting Pacing Amplitude: 2.75 V
Lead Channel Setting Pacing Pulse Width: 1 ms
Lead Channel Setting Sensing Sensitivity: 0.5 mV
Pulse Gen Serial Number: 8926353

## 2022-12-05 MED ORDER — CARVEDILOL 25 MG PO TABS: 25.0000 mg | ORAL_TABLET | Freq: Two times a day (BID) | ORAL | 6 refills | Status: DC

## 2022-12-05 NOTE — Telephone Encounter (Signed)
-----   Message from Marca Ancona sent at 12/01/2022  9:12 PM EDT ----- He has symptomatic NSVT runs.  Already on mexiletine.  Would like him to be seen by EP.  Also needs to increase Coreg to 25 mg bid.

## 2022-12-05 NOTE — Telephone Encounter (Signed)
Patient's Coreg medication has been increased and sent to his pharmacy and updated in his chart. In addition, Pt aware, agreeable, and verbalized understanding.

## 2022-12-08 ENCOUNTER — Other Ambulatory Visit: Payer: Self-pay | Admitting: Internal Medicine

## 2022-12-13 ENCOUNTER — Other Ambulatory Visit (HOSPITAL_COMMUNITY): Payer: Self-pay | Admitting: Cardiology

## 2022-12-20 ENCOUNTER — Other Ambulatory Visit (HOSPITAL_COMMUNITY): Payer: Self-pay | Admitting: Cardiology

## 2022-12-23 NOTE — Progress Notes (Signed)
Remote ICD transmission.   

## 2022-12-25 NOTE — Progress Notes (Unsigned)
  Electrophysiology Office Note:   ID:  Jason Hogan, DOB Jul 20, 1975, MRN [id number]  Primary Cardiologist: Charlton Haws, MD Electrophysiologist: Sherryl Manges, MD  {Click to update primary MD,subspecialty MD or APP then REFRESH:1}    History of Present Illness:   Jason Hogan is a 47 y.o. male with h/o ICM  seen today for routine electrophysiology followup.   Since last being seen in our clinic the patient reports doing ***.  he denies chest pain, palpitations, dyspnea, PND, orthopnea, nausea, vomiting, dizziness, syncope, edema, weight gain, or early satiety.   Review of systems complete and found to be negative unless listed in HPI.   EP Information / Studies Reviewed:    EKG is not ordered today. EKG from 10/17/2022 reviewed which showed NSR at 89 bpm       ICD Interrogation-  reviewed in detail today,  See PACEART report.  Device History: Abbott Single Chamber ICD implanted 2011, gen change 06/04/2021 for CHF  Monitor 11/2022 min HR 64 bpm, max HR 182 bpm AVG HR 92 bpm.  Predominant underlying rhythm was Sinus Rhythm. First Degree AV Block was present.  [id number] Ventricular Tachycardia runs occurred, the run with the fastest interval lasting 4 beats with a max rate of 182 bpm, the longest lasting 30.9 secs with an avg rate of 136 bpm.  True duration of Ventricular Tachycardia difficult to ascertain due to artifact. Ventricular Tachycardia was detected within +/- 45 seconds of symptomatic patient  event(s).  PACs <1% PVCs 1.9%, Couplets, triplets <1%   Conclusion: 1. Multiple NSVT runs, longest 30 seconds.  NSRT runs associated with symptoms.  2. 1.9% PVCs 3. Predominantly NSR  Physical Exam:   VS:  There were no vitals taken for this visit.   Wt Readings from Last 3 Encounters:  10/17/22 208 lb 9.6 oz (94.6 kg)  12/12/21 229 lb 12.8 oz (104.2 kg)  09/17/21 223 lb (101.2 kg)     GEN: Well nourished, well developed in no acute distress NECK: No JVD; No carotid  bruits CARDIAC: {EPRHYTHM:28826}, no murmurs, rubs, gallops RESPIRATORY:  Clear to auscultation without rales, wheezing or rhonchi  ABDOMEN: Soft, non-tender, non-distended EXTREMITIES:  No edema; No deformity   ASSESSMENT AND PLAN:    Chronic systolic dysfunction s/p Abbott single chamber ICD  euvolemic today Stable on an appropriate medical regimen Normal ICD function See Pace Art report No changes today  Disposition:   Follow up with {EPPROVIDERS:28135} {EPFOLLOW UP:28173}   Signed, Graciella Freer, PA-C

## 2022-12-26 ENCOUNTER — Encounter: Payer: Self-pay | Admitting: Student

## 2022-12-26 ENCOUNTER — Ambulatory Visit: Payer: BC Managed Care – PPO | Attending: Student | Admitting: Student

## 2022-12-26 VITALS — BP 110/82 | HR 82 | Ht 74.0 in | Wt 208.2 lb

## 2022-12-26 DIAGNOSIS — I5022 Chronic systolic (congestive) heart failure: Secondary | ICD-10-CM | POA: Diagnosis not present

## 2022-12-26 DIAGNOSIS — I493 Ventricular premature depolarization: Secondary | ICD-10-CM

## 2022-12-26 DIAGNOSIS — I4729 Other ventricular tachycardia: Secondary | ICD-10-CM

## 2022-12-26 LAB — CUP PACEART INCLINIC DEVICE CHECK
Battery Remaining Longevity: 90 mo
Brady Statistic RV Percent Paced: 0 %
Date Time Interrogation Session: 20241107112637
HighPow Impedance: 49.2024
Implantable Lead Connection Status: 753985
Implantable Lead Implant Date: 20111010
Implantable Lead Location: 753860
Implantable Lead Model: 7121
Implantable Pulse Generator Implant Date: 20230417
Lead Channel Impedance Value: 1075 Ohm
Lead Channel Pacing Threshold Amplitude: 2.5 V
Lead Channel Pacing Threshold Amplitude: 2.5 V
Lead Channel Pacing Threshold Pulse Width: 1 ms
Lead Channel Pacing Threshold Pulse Width: 1 ms
Lead Channel Sensing Intrinsic Amplitude: 11.5 mV
Lead Channel Setting Pacing Amplitude: 2.75 V
Lead Channel Setting Pacing Pulse Width: 1 ms
Lead Channel Setting Sensing Sensitivity: 0.5 mV
Pulse Gen Serial Number: 8926353
Zone Setting Status: 755011

## 2022-12-26 NOTE — Patient Instructions (Signed)
Medication Instructions:  Your physician recommends that you continue on your current medications as directed. Please refer to the Current Medication list given to you today.  *If you need a refill on your cardiac medications before your next appointment, please call your pharmacy*  Lab Work: None ordered If you have labs (blood work) drawn today and your tests are completely normal, you will receive your results only by: MyChart Message (if you have MyChart) OR A paper copy in the mail If you have any lab test that is abnormal or we need to change your treatment, we will call you to review the results.  Follow-Up: At [facility], you and your health needs are our priority.  As part of our continuing mission to provide you with exceptional heart care, we have created designated Provider Care Teams.  These Care Teams include your primary Cardiologist (physician) and Advanced Practice Providers (APPs -  Physician Assistants and Nurse Practitioners) who all work together to provide you with the care you need, when you need it.  Your next appointment:   3 month(s)  Provider:   Michael "Andy" Tillery, PA-C  

## 2023-02-01 ENCOUNTER — Other Ambulatory Visit: Payer: Self-pay

## 2023-02-01 ENCOUNTER — Ambulatory Visit: Payer: BC Managed Care – PPO

## 2023-02-01 ENCOUNTER — Ambulatory Visit
Admission: RE | Admit: 2023-02-01 | Discharge: 2023-02-01 | Disposition: A | Discharge status: Home / Self Care | Payer: BC Managed Care – PPO | Source: Ambulatory Visit | Attending: Family Medicine | Admitting: Family Medicine

## 2023-02-01 VITALS — BP 109/74 | HR 90 | Temp 98.0°F

## 2023-02-01 DIAGNOSIS — J189 Pneumonia, unspecified organism: Secondary | ICD-10-CM

## 2023-02-01 DIAGNOSIS — R059 Cough, unspecified: Secondary | ICD-10-CM | POA: Diagnosis not present

## 2023-02-01 DIAGNOSIS — R918 Other nonspecific abnormal finding of lung field: Secondary | ICD-10-CM | POA: Diagnosis not present

## 2023-02-01 MED ORDER — BENZONATATE 200 MG PO CAPS: 200.0000 mg | ORAL_CAPSULE | Freq: Three times a day (TID) | ORAL | 0 refills | Status: AC | PRN

## 2023-02-01 MED ORDER — AZITHROMYCIN 250 MG PO TABS: 250.0000 mg | ORAL_TABLET | Freq: Every day | ORAL | 0 refills | Status: DC

## 2023-02-01 MED ORDER — HYDROCODONE BIT-HOMATROP MBR 5-1.5 MG/5ML PO SOLN
5.0000 mL | Freq: Four times a day (QID) | ORAL | 0 refills | Status: DC | PRN
Start: 2023-02-01 — End: 2023-02-20

## 2023-02-01 MED ORDER — AMOXICILLIN-POT CLAVULANATE 875-125 MG PO TABS: 1.0000 | ORAL_TABLET | Freq: Two times a day (BID) | ORAL | 0 refills | Status: AC

## 2023-02-01 MED ORDER — PREDNISONE 20 MG PO TABS: ORAL_TABLET | ORAL | 0 refills | Status: DC

## 2023-02-01 NOTE — ED Triage Notes (Signed)
Pt c/o cough with mucous for the last 2 weeks. States he has been taking cold meds with no relief. Feels a rattle in his chest with deep breathing.

## 2023-02-01 NOTE — Discharge Instructions (Addendum)
Advised patient to take medications as directed with food to completion.  Advised patient may take Tessalon Perles daily or as needed for cough.  Advised may use Hycodan cough syrup at night for cough prior to sleep due to sedative effects.  Encouraged to increase daily water intake to 64 ounces per day while taking these medications.  Advised patient to repeat chest x-ray on or about 03/04/2023 to ensure right upper lobe pneumonia has resolved.  Advised if symptoms worsen and/or unresolved please follow-up with PCP or here for further evaluation.

## 2023-02-01 NOTE — ED Provider Notes (Signed)
[facility]    CSN: [id number] Arrival date & time: 02/01/23  1335      History   Chief Complaint Chief Complaint  Patient presents with   Cough    Wheezing and Cough, Crackling sound when breathing. - Entered by patient    HPI Jason Hogan is a 47 y.o. male.   HPI 47 year old male presents with cough for 2 weeks.  PMH significant for CHF, CAD (s/p/MI), and renal insufficiency.  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 Qualifier: Diagnosis of  By: Eden Emms, MD, Harrington Challenger    CAD (coronary artery disease)    a. large anterior MI 2011 s/p PTCA/DES to 100%. b. occluded LAD with occluded collateralized silent RCA, balloon angioplasty to stented segment of LAD and DES x1 to mid circumflex in 10/2017.   CHF (congestive heart failure) (HCC)    Cholelithiasis 06/22/2007   Qualifier: Diagnosis of  By: Maris Berger    Chronic systolic heart failure (HCC) 09/18/2009   Qualifier: Diagnosis of  By: Kem Parkinson     Depressive disorder, not elsewhere classified 06/22/2007   Qualifier: Diagnosis of  By: Maris Berger    History of gout    History of kidney stones    Hyperlipidemia 81/2011   Hypertension    Impaired glucose tolerance 10/05/2012   Insomnia, unspecified 06/22/2007   Qualifier: Diagnosis of  By: Jonny Ruiz MD, Len Blalock    Ischemic cardiomyopathy 10/20/2009   Qualifier: Diagnosis of  By: Graciela Husbands, MD, Susie Cassette    Low testosterone 10/14/2013   Migraine Headache 09/18/2009   Qualifier: Diagnosis of  By: Kem Parkinson     Renal  calculus    Renal Insufficiency 09/18/2009   Qualifier: Diagnosis of  By: Kem Parkinson     Respiratory failure (HCC) 08/2009   Rhabdomyolysis 09/18/2009   Qualifier: Diagnosis of  By: Kem Parkinson     VT (ventricular tachycardia) [facility])     Patient Active Problem List   Diagnosis Date Noted   ARF (acute renal failure) (HCC) 03/05/2021   Intractable diarrhea 03/04/2021   Vitamin D deficiency 05/02/2020   Polycythemia 05/02/2020   Nonallopathic lesion of sacral region 01/26/2020   Hypogonadism in male 04/21/2019   Allergy 04/21/2019   Plantar fasciitis of left foot 02/25/2019   Asthma exacerbation 02/11/2019   Statin myopathy 11/05/2018   Left wrist fracture, closed, initial encounter 01/01/2018   Ischemic cardiomyopathy s/p AICD 11/25/2017   Hypertension 11/25/2017   CAD (coronary artery disease), native coronary artery 11/15/2017   Unstable angina (HCC)    Labral tear of shoulder, right, initial encounter 03/04/2017   Acute bursitis of right shoulder 02/04/2017   Elevated CPK 01/01/2017   Elevated sed rate 01/01/2017   Hyperestrogenism 10/04/2016   Fatigue 08/16/2016   Hematuria 07/17/2016   Feels feverish 07/04/2016   Chest pain 07/04/2016   Dyspnea 07/04/2016   Rib pain on right side 07/04/2016   Abnormal urine 07/04/2016   Low back pain 07/04/2016   Muscle cramps 07/04/2016   Strain of groin, left, initial encounter 06/19/2016  Cervical radiculopathy at C8 03/08/2015   Right shoulder pain 12/15/2014   Polyarthralgia 11/17/2014   Pes anserine bursitis 11/17/2014   Epistaxis, recurrent 08/18/2014   Allergic rhinitis 08/18/2014   Shoulder bursitis 08/01/2014   Tachycardia 04/19/2014   Acute respiratory infection 04/19/2014   Gout 12/06/2013   Insomnia 10/14/2013   Low testosterone 10/14/2013   Cough 06/14/2013   Impaired glucose tolerance 10/05/2012   Reduced libido 01/07/2012   Anxiety 10/03/2011   Sinusitis, acute 10/03/2011   Encounter for well  adult exam with abnormal findings 10/02/2011   Implantable cardioverter-defibrillator (ICD) in situ 01/17/2010   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/20/2009   Migraine headache 09/18/2009   Acute myocardial infarction (HCC) 09/18/2009   Chronic systolic heart failure (HCC) 09/18/2009   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/18/2009   Rhabdomyolysis 09/18/2009   ELEVATED BLOOD PRESSURE WITHOUT DIAGNOSIS OF HYPERTENSION 12/19/2008   Hyperlipidemia 06/22/2007   Depression 06/22/2007   Sleep disorder 06/22/2007   Asthma 09/20/2006   RENAL CALCULUS, HX OF 09/20/2006    Past Surgical History:  Procedure Laterality Date   CARDIAC CATHETERIZATION N/A 05/17/2015   Procedure: Left Heart Cath and Coronary Angiography;  Surgeon: Wendall Stade, MD;  Location: [facility] INVASIVE CV LAB;  Service: Cardiovascular;  Laterality: N/A;   CARDIAC CATHETERIZATION  2012   CORONARY ANGIOPLASTY WITH STENT PLACEMENT  2011   CORONARY STENT INTERVENTION N/A 11/14/2017   Procedure: CORONARY STENT INTERVENTION;  Surgeon: Kathleene Hazel, MD;  Location: [facility] INVASIVE CV LAB;  Service: Cardiovascular;  Laterality: N/A;   ICD GENERATOR CHANGEOUT N/A 06/04/2021   Procedure: ICD GENERATOR CHANGEOUT;  Surgeon: Duke Salvia, MD;  Location[facility] INVASIVE CV LAB;  Service: Cardiovascular;  Laterality: N/A;   ICD IMPLANT  11/2009   "w/pacing function"   RIGHT/LEFT HEART CATH AND CORONARY ANGIOGRAPHY N/A 11/14/2017   Procedure: RIGHT/LEFT HEART CATH AND CORONARY ANGIOGRAPHY;  Surgeon: Kathleene Hazel, MD;  Location: [facility] INVASIVE CV LAB;  Service: Cardiovascular;  Laterality: N/A;       Home Medications    Prior to Admission medications   Medication Sig Start Date End Date Taking? Authorizing Provider  amoxicillin-clavulanate (AUGMENTIN) 875-125 MG tablet Take 1 tablet by mouth 2 (two) times daily for 10 days. 02/01/23 02/11/23 Yes Trevor Iha, FNP  azithromycin (ZITHROMAX) 250 MG tablet Take 1 tablet  (250 mg total) by mouth daily. Take first 2 tablets together, then 1 every day until finished. 02/01/23  Yes Trevor Iha, FNP  benzonatate (TESSALON) 200 MG capsule Take 1 capsule (200 mg total) by mouth 3 (three) times daily as needed for up to 7 days. 02/01/23 02/08/23 Yes Trevor Iha, FNP  HYDROcodone bit-homatropine (HYCODAN) 5-1.5 MG/5ML syrup Take 5 mLs by mouth every 6 (six) hours as needed for cough. 02/01/23  Yes Trevor Iha, FNP  predniSONE (DELTASONE) 20 MG tablet Take 3 tabs PO daily x 5 days. 02/01/23  Yes Trevor Iha, FNP  albuterol (VENTOLIN HFA) 108 (90 Base) MCG/ACT inhaler Inhale 1-2 puffs into the lungs every 6 (six) hours as needed for wheezing or shortness of breath. 02/24/20   Corwin Levins, MD  allopurinol (ZYLOPRIM) 100 MG tablet TAKE 2 TABLETS BY MOUTH DAILY 11/06/22   Corwin Levins, MD  ALPRAZolam Prudy Feeler) 0.5 MG tablet TAKE ONE TABLET BY MOUTH DAILY AS NEEDED FOR ANXIETY 02/13/21   Corwin Levins, MD  aspirin EC 81 MG tablet Take 81 mg by mouth daily.    [provider]  azelastine (ASTELIN)  0.1 % nasal spray Place 1 spray into both nostrils daily as needed for rhinitis. 08/14/19   [provider]  budesonide-formoterol (SYMBICORT) 80-4.5 MCG/ACT inhaler Inhale 2 puffs into the lungs daily as needed (wheezing). 07/01/17   [provider]  carvedilol (COREG) 25 MG tablet Take 1 tablet (25 mg total) by mouth 2 (two) times daily. 12/05/22   Laurey Morale, MD  clopidogrel (PLAVIX) 75 MG tablet TAKE 1 TABLET BY MOUTH DAILY 10/07/22   Laurey Morale, MD  dapagliflozin propanediol (FARXIGA) 10 MG TABS tablet TAKE ONE TABLET BY MOUTH DAILY BEFORE BREAKFAST 08/07/22   Laurey Morale, MD  Dulaglutide (TRULICITY) 1.5 MG/0.5ML SOPN Inject 1.5 mg into the skin once a week. Fridays    [provider]  ENTRESTO 97-103 MG TAKE ONE TABLET BY MOUTH TWICE A DAY 05/15/22   Laurey Morale, MD  Evolocumab (REPATHA SURECLICK) 140 MG/ML SOAJ Inject 140  mg into the skin every 14 (fourteen) days. 03/04/22   Laurey Morale, MD  ezetimibe (ZETIA) 10 MG tablet TAKE 1 TABLET BY MOUTH DAILY 12/13/22   Laurey Morale, MD  metolazone (ZAROXOLYN) 5 MG tablet TAKE 1 TABLET (5 MG TOTAL) BY MOUTH DAILY AS NEEDED (FOR EDEMA). 10/08/16   Wendall Stade, MD  mexiletine (MEXITIL) 200 MG capsule TAKE 1 CAPSULE BY MOUTH 2 TIMES A DAY 11/06/22   Laurey Morale, MD  potassium chloride SA (KLOR-CON M) 20 MEQ tablet Take 20 mEq by mouth daily as needed (when taking torsemide).    [provider]  sertraline (ZOLOFT) 100 MG tablet Take 1 tablet (100 mg total) by mouth daily. 03/04/22   Corwin Levins, MD  spironolactone (ALDACTONE) 25 MG tablet TAKE 1 TABLET BY MOUTH DAILY 12/13/22   Laurey Morale, MD  tiZANidine (ZANAFLEX) 4 MG tablet TAKE ONE TABLET BY MOUTH TWICE A DAY AS NEEDED Annual appt due in July must see provider for future refills 08/12/22   Corwin Levins, MD  torsemide (DEMADEX) 20 MG tablet Take 1 tablet (20 mg total) by mouth daily as needed. 05/28/19   Laurey Morale, MD  VASCEPA 1 g capsule Take 2 capsules (2 g total) by mouth 2 (two) times daily. 08/19/22   Laurey Morale, MD    Family History Family History  Problem Relation Age of Onset   Heart attack Father    Heart disease Other    Diabetes Other    Hypothyroidism Other     Social History Social History   Tobacco Use   Smoking status: Former    Current packs/day: 0.00    Average packs/day: 0.1 packs/day for 2.0 years (0.2 ttl pk-yrs)    Types: Cigarettes    Start date: 02/19/1992    Quit date: 02/18/1994    Years since quitting: 28.9   Smokeless tobacco: Never  Vaping Use   Vaping status: Never Used  Substance Use Topics   Alcohol use: Not Currently    Comment: 11/25/2017 "couple beers/month; if that"   Drug use: Never     Allergies   Levaquin [levofloxacin], Ace inhibitors, Crestor [rosuvastatin], Lunesta [eszopiclone], and Zocor [simvastatin]   Review of  Systems Review of Systems  Constitutional:  Positive for fatigue.  HENT:  Positive for congestion.   Respiratory:  Positive for cough.   All other systems reviewed and are negative.    Physical Exam Triage Vital Signs ED Triage Vitals  Encounter Vitals Group     BP  Systolic BP Percentile      Diastolic BP Percentile      Pulse      Resp      Temp      Temp src      SpO2      Weight      Height      Head Circumference      Peak Flow      Pain Score      Pain Loc      Pain Education      Exclude from Growth Chart    No data found.  Updated Vital Signs BP 109/74 (BP Location: Right Arm)   Pulse 90   Temp 98 F (36.7 C) (Oral)   SpO2 96%    Physical Exam Vitals and nursing note reviewed.  Constitutional:      Appearance: Normal appearance. He is normal weight. He is ill-appearing.  HENT:     Head: Normocephalic and atraumatic.     Right Ear: Tympanic membrane, ear canal and external ear normal.     Left Ear: Tympanic membrane, ear canal and external ear normal.     Mouth/Throat:     Mouth: Mucous membranes are moist.     Pharynx: Oropharynx is clear.  Eyes:     Extraocular Movements: Extraocular movements intact.     Conjunctiva/sclera: Conjunctivae normal.     Pupils: Pupils are equal, round, and reactive to light.  Cardiovascular:     Rate and Rhythm: Normal rate and regular rhythm.     Pulses: Normal pulses.     Heart sounds: Normal heart sounds.  Pulmonary:     Effort: Pulmonary effort is normal.     Breath sounds: Rales present. No wheezing or rhonchi.     Comments: Fine Rales and mild crackles over right upper, frequent nonproductive cough on exam Musculoskeletal:        General: Normal range of motion.     Cervical back: Normal range of motion and neck supple.  Skin:    General: Skin is warm and dry.  Neurological:     General: No focal deficit present.     Mental Status: He is alert and oriented to person, place, and time. Mental status  is at baseline.  Psychiatric:        Mood and Affect: Mood normal.        Behavior: Behavior normal.      UC Treatments / Results  Labs (all labs ordered are listed, but only abnormal results are displayed) Labs Reviewed - No data to display  EKG   Radiology DG Chest 2 View Result Date: 02/01/2023 CLINICAL DATA:  Cough EXAM: CHEST - 2 VIEW COMPARISON:  03/04/2021 FINDINGS: Single lead implanted cardiac device remains in place. The heart size and mediastinal contours are within normal limits. Subtle reticulonodular opacity in the right upper lobe. Lungs appear otherwise clear. No pleural effusion. No pneumothorax. The visualized skeletal structures are unremarkable. IMPRESSION: Subtle reticulonodular opacity in the right upper lobe, which may reflect atypical infection. Electronically Signed   By: Duanne Guess D.O.   On: 02/01/2023 16:08    Procedures Procedures (including critical care time)  Medications Ordered in UC Medications - No data to display  Initial Impression / Assessment and Plan / UC Course  I have reviewed the triage vital signs and the nursing notes.  Pertinent labs & imaging results that were available during my care of the patient were reviewed by me and considered in  my medical decision making (see chart for details).     MDM: 1.  Community-acquired pneumonia of the right upper lobe of lung-Rx'd Zithromax (500 mg-day 1, then 250 mg day 2-5), Rx'd Augmentin 875/125 mg tablet: Take 1 tablet twice daily x 10 days; 2.  Cough, unspecified type-Rx'd prednisone 20 mg tablet: Take 3 tabs p.o. daily x 5 days, Rx'd Tessalon 200 mg capsule: Take 1 capsule 3 times daily, as needed for cough, Rx'd Hycodan 5-1.5 mg / 5 mL syrup: Take 5 mL every 6 hours for cough, as needed. Advised patient to take medications as directed with food to completion.  Advised patient may take Tessalon Perles daily or as needed for cough.  Advised may use Hycodan cough syrup at night for cough  prior to sleep due to sedative effects.  Encouraged to increase daily water intake to 64 ounces per day while taking these medications.  Advised patient to repeat chest x-ray on or about 03/04/2023 to ensure right upper lobe pneumonia has resolved.  Advised if symptoms worsen and/or unresolved please follow-up with PCP or here for further evaluation.  Patient discharged home, hemodynamically stable.  Final diagnoses:  Cough, unspecified type  Community acquired pneumonia of right upper lobe of lung     Discharge Instructions      Advised patient to take medications as directed with food to completion.  Advised patient may take Tessalon Perles daily or as needed for cough.  Advised may use Hycodan cough syrup at night for cough prior to sleep due to sedative effects.  Encouraged to increase daily water intake to 64 ounces per day while taking these medications.  Advised patient to repeat chest x-ray on or about 03/04/2023 to ensure right upper lobe pneumonia has resolved.  Advised if symptoms worsen and/or unresolved please follow-up with PCP or here for further evaluation.     ED Prescriptions     Medication Sig Dispense Auth. Provider   benzonatate (TESSALON) 200 MG capsule Take 1 capsule (200 mg total) by mouth 3 (three) times daily as needed for up to 7 days. 40 capsule Trevor Iha, FNP   HYDROcodone bit-homatropine (HYCODAN) 5-1.5 MG/5ML syrup Take 5 mLs by mouth every 6 (six) hours as needed for cough. 120 mL Trevor Iha, FNP   predniSONE (DELTASONE) 20 MG tablet Take 3 tabs PO daily x 5 days. 15 tablet Trevor Iha, FNP   amoxicillin-clavulanate (AUGMENTIN) 875-125 MG tablet Take 1 tablet by mouth 2 (two) times daily for 10 days. 20 tablet Trevor Iha, FNP   azithromycin (ZITHROMAX) 250 MG tablet Take 1 tablet (250 mg total) by mouth daily. Take first 2 tablets together, then 1 every day until finished. 6 tablet Trevor Iha, FNP      I have reviewed the PDMP during this  encounter.   Trevor Iha, FNP 02/01/23 1620

## 2023-02-06 ENCOUNTER — Other Ambulatory Visit: Payer: Self-pay | Admitting: Internal Medicine

## 2023-02-08 ENCOUNTER — Ambulatory Visit
Admission: RE | Admit: 2023-02-08 | Discharge: 2023-02-08 | Disposition: A | Discharge status: Home / Self Care | Payer: BC Managed Care – PPO | Source: Ambulatory Visit | Attending: Family Medicine | Admitting: Family Medicine

## 2023-02-08 ENCOUNTER — Other Ambulatory Visit: Payer: Self-pay

## 2023-02-08 VITALS — BP 120/78 | HR 87 | Temp 98.4°F | Resp 16

## 2023-02-08 DIAGNOSIS — J189 Pneumonia, unspecified organism: Secondary | ICD-10-CM

## 2023-02-08 DIAGNOSIS — J4521 Mild intermittent asthma with (acute) exacerbation: Secondary | ICD-10-CM

## 2023-02-08 NOTE — ED Provider Notes (Signed)
[facility]    CSN: [id number] Arrival date & time: 02/08/23  0946      History   Chief Complaint Chief Complaint  Patient presents with   Cough    HPI Jason Hogan is a 47 y.o. male.   Very pleasant 47 year old gentleman with chronic heart disease, congestive heart failure, automatic implantable defibrillator, and coronary artery disease.  All of these are well-controlled by cardiology and patient management. He was seen here for a cough on 02/04/2023.  As of yesterday he was not feeling any better so made an appointment to be seen today.  As he gets up today, he is a little bit better than yesterday.  He has completed the azithromycin Z-Pak.  He is completed his 5 days of prednisone.  He is continuing to take the Augmentin twice a day.  It is upsetting his stomach a little bit.  He is advised to take a probiotic.  He is using either Tessalon or Tussionex for the cough.  He still having bad coughing spells.  Still feels very tired.  He has underlying asthma and uses Symbicort during the allergy season but not year-round.  He does use albuterol as needed and has been using this more with his pneumonia.  Has not used it since yesterday    Past Medical History:  Diagnosis Date   Acute Myocardial Infarction 08/18/2009   Qualifier: Diagnosis of  By: Zachery Dauer, Kimalexis     Allergic rhinitis, cause unspecified 10/03/2011   Anxiety 10/03/2011   Asthma 09/20/2006   Qualifier: Diagnosis of  By: Jonny Ruiz MD, Len Blalock    Automatic implantable cardiac defibrillator in situ 01/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 Qualifier: Diagnosis of  By: Eden Emms, MD, Harrington Challenger    CAD (coronary artery disease)    a. large anterior MI 2011 s/p PTCA/DES to 100%. b. occluded LAD with occluded collateralized silent RCA, balloon angioplasty to stented segment of LAD and DES x1 to mid circumflex in 10/2017.   CHF (congestive heart failure) (HCC)    Cholelithiasis 06/22/2007   Qualifier:  Diagnosis of  By: Maris Berger    Chronic systolic heart failure (HCC) 09/18/2009   Qualifier: Diagnosis of  By: Kem Parkinson     Depressive disorder, not elsewhere classified 06/22/2007   Qualifier: Diagnosis of  By: Maris Berger    History of gout    History of kidney stones    Hyperlipidemia 81/2011   Hypertension    Impaired glucose tolerance 10/05/2012   Insomnia, unspecified 06/22/2007   Qualifier: Diagnosis of  By: Jonny Ruiz MD, Len Blalock    Ischemic cardiomyopathy 10/20/2009   Qualifier: Diagnosis of  By: Graciela Husbands, MD, Susie Cassette    Low testosterone 10/14/2013   Migraine Headache 09/18/2009   Qualifier: Diagnosis of  By: Kem Parkinson     Renal calculus    Renal Insufficiency 09/18/2009   Qualifier: Diagnosis of  By: Kem Parkinson     Respiratory failure (HCC) 08/2009   Rhabdomyolysis 09/18/2009   Qualifier: Diagnosis of  By: Kem Parkinson     VT (ventricular tachycardia) [facility])     Patient Active Problem List   Diagnosis Date Noted   ARF (acute renal failure) (HCC) 03/05/2021   Intractable diarrhea 03/04/2021   Vitamin D deficiency 05/02/2020   Polycythemia 05/02/2020   Nonallopathic lesion of sacral region 01/26/2020   Hypogonadism in male 04/21/2019   Allergy 04/21/2019   Plantar fasciitis of left foot 02/25/2019  Asthma exacerbation 02/11/2019   Statin myopathy 11/05/2018   Left wrist fracture, closed, initial encounter 01/01/2018   Ischemic cardiomyopathy s/p AICD 11/25/2017   Hypertension 11/25/2017   CAD (coronary artery disease), native coronary artery 11/15/2017   Unstable angina (HCC)    Labral tear of shoulder, right, initial encounter 03/04/2017   Acute bursitis of right shoulder 02/04/2017   Hyperestrogenism 10/04/2016   Fatigue 08/16/2016   Hematuria 07/17/2016   Chest pain 07/04/2016   Dyspnea 07/04/2016   Low back pain 07/04/2016   Cervical radiculopathy at C8 03/08/2015   Polyarthralgia 11/17/2014   Pes  anserine bursitis 11/17/2014   Epistaxis, recurrent 08/18/2014   Allergic rhinitis 08/18/2014   Shoulder bursitis 08/01/2014   Tachycardia 04/19/2014   Gout 12/06/2013   Insomnia 10/14/2013   Low testosterone 10/14/2013   Impaired glucose tolerance 10/05/2012   Reduced libido 01/07/2012   Anxiety 10/03/2011   Encounter for well adult exam with abnormal findings 10/02/2011   Implantable cardioverter-defibrillator (ICD) in situ 01/17/2010   Migraine headache 09/18/2009   Acute myocardial infarction (HCC) 09/18/2009   Chronic systolic heart failure (HCC) 09/18/2009   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/18/2009   Rhabdomyolysis 09/18/2009   Hyperlipidemia 06/22/2007   Depression 06/22/2007   Sleep disorder 06/22/2007   Asthma 09/20/2006   RENAL CALCULUS, HX OF 09/20/2006    Past Surgical History:  Procedure Laterality Date   CARDIAC CATHETERIZATION N/A 05/17/2015   Procedure: Left Heart Cath and Coronary Angiography;  Surgeon: Wendall Stade, MD;  Location: [facility] INVASIVE CV LAB;  Service: Cardiovascular;  Laterality: N/A;   CARDIAC CATHETERIZATION  2012   CORONARY ANGIOPLASTY WITH STENT PLACEMENT  2011   CORONARY STENT INTERVENTION N/A 11/14/2017   Procedure: CORONARY STENT INTERVENTION;  Surgeon: Kathleene Hazel, MD;  Location: [facility] INVASIVE CV LAB;  Service: Cardiovascular;  Laterality: N/A;   ICD GENERATOR CHANGEOUT N/A 06/04/2021   Procedure: ICD GENERATOR CHANGEOUT;  Surgeon: Duke Salvia, MD;  Location: [facility] INVASIVE CV LAB;  Service: Cardiovascular;  Laterality: N/A;   ICD IMPLANT  11/2009   "w/pacing function"   RIGHT/LEFT HEART CATH AND CORONARY ANGIOGRAPHY N/A 11/14/2017   Procedure: RIGHT/LEFT HEART CATH AND CORONARY ANGIOGRAPHY;  Surgeon: Kathleene Hazel, MD;  Location: [facility] INVASIVE CV LAB;  Service: Cardiovascular;  Laterality: N/A;       Home Medications    Prior to Admission medications   Medication Sig Start Date End Date Taking?  Authorizing Provider  albuterol (VENTOLIN HFA) 108 (90 Base) MCG/ACT inhaler Inhale 1-2 puffs into the lungs every 6 (six) hours as needed for wheezing or shortness of breath. 02/24/20   Corwin Levins, MD  allopurinol (ZYLOPRIM) 100 MG tablet TAKE 2 TABLETS BY MOUTH DAILY 11/06/22   Corwin Levins, MD  ALPRAZolam Prudy Feeler) 0.5 MG tablet TAKE ONE TABLET BY MOUTH DAILY AS NEEDED FOR ANXIETY 02/13/21   Corwin Levins, MD  amoxicillin-clavulanate (AUGMENTIN) 875-125 MG tablet Take 1 tablet by mouth 2 (two) times daily for 10 days. 02/01/23 02/11/23  Trevor Iha, FNP  aspirin EC 81 MG tablet Take 81 mg by mouth daily.    [provider]  azelastine (ASTELIN) 0.1 % nasal spray Place 1 spray into both nostrils daily as needed for rhinitis. 08/14/19   [provider]  benzonatate (TESSALON) 200 MG capsule Take 1 capsule (200 mg total) by mouth 3 (three) times daily as needed for up to 7 days. 02/01/23 02/08/23  Trevor Iha, FNP  budesonide-formoterol (SYMBICORT)  80-4.5 MCG/ACT inhaler Inhale 2 puffs into the lungs daily as needed (wheezing). 07/01/17   [provider]  carvedilol (COREG) 25 MG tablet Take 1 tablet (25 mg total) by mouth 2 (two) times daily. 12/05/22   Laurey Morale, MD  clopidogrel (PLAVIX) 75 MG tablet TAKE 1 TABLET BY MOUTH DAILY 10/07/22   Laurey Morale, MD  dapagliflozin propanediol (FARXIGA) 10 MG TABS tablet TAKE ONE TABLET BY MOUTH DAILY BEFORE BREAKFAST 08/07/22   Laurey Morale, MD  Dulaglutide (TRULICITY) 1.5 MG/0.5ML SOPN Inject 1.5 mg into the skin once a week. Fridays    [provider]  ENTRESTO 97-103 MG TAKE ONE TABLET BY MOUTH TWICE A DAY 05/15/22   Laurey Morale, MD  Evolocumab (REPATHA SURECLICK) 140 MG/ML SOAJ Inject 140 mg into the skin every 14 (fourteen) days. 03/04/22   Laurey Morale, MD  ezetimibe (ZETIA) 10 MG tablet TAKE 1 TABLET BY MOUTH DAILY 12/13/22   Laurey Morale, MD  HYDROcodone bit-homatropine [facility]) 5-1.5  MG/5ML syrup Take 5 mLs by mouth every 6 (six) hours as needed for cough. 02/01/23   Trevor Iha, FNP  metolazone (ZAROXOLYN) 5 MG tablet TAKE 1 TABLET (5 MG TOTAL) BY MOUTH DAILY AS NEEDED (FOR EDEMA). 10/08/16   Wendall Stade, MD  mexiletine (MEXITIL) 200 MG capsule TAKE 1 CAPSULE BY MOUTH 2 TIMES A DAY 11/06/22   Laurey Morale, MD  potassium chloride SA (KLOR-CON M) 20 MEQ tablet Take 20 mEq by mouth daily as needed (when taking torsemide).    [provider]  sertraline (ZOLOFT) 100 MG tablet Take 1 tablet (100 mg total) by mouth daily. 03/04/22   Corwin Levins, MD  spironolactone (ALDACTONE) 25 MG tablet TAKE 1 TABLET BY MOUTH DAILY 12/13/22   Laurey Morale, MD  tiZANidine (ZANAFLEX) 4 MG tablet TAKE ONE TABLET BY MOUTH TWICE A DAY AS NEEDED Annual appt due in July must see provider for future refills 08/12/22   Corwin Levins, MD  torsemide (DEMADEX) 20 MG tablet Take 1 tablet (20 mg total) by mouth daily as needed. 05/28/19   Laurey Morale, MD  VASCEPA 1 g capsule Take 2 capsules (2 g total) by mouth 2 (two) times daily. 08/19/22   Laurey Morale, MD    Family History Family History  Problem Relation Age of Onset   Heart attack Father    Heart disease Other    Diabetes Other    Hypothyroidism Other     Social History Social History   Tobacco Use   Smoking status: Former    Current packs/day: 0.00    Average packs/day: 0.1 packs/day for 2.0 years (0.2 ttl pk-yrs)    Types: Cigarettes    Start date: 02/19/1992    Quit date: 02/18/1994    Years since quitting: 28.9   Smokeless tobacco: Never  Vaping Use   Vaping status: Never Used  Substance Use Topics   Alcohol use: Not Currently    Comment: 11/25/2017 "couple beers/month; if that"   Drug use: Never     Allergies   Levaquin [levofloxacin], Ace inhibitors, Crestor [rosuvastatin], Lunesta [eszopiclone], and Zocor [simvastatin]   Review of Systems Review of Systems See HPI  Physical Exam Triage Vital  Signs ED Triage Vitals  Encounter Vitals Group     BP 02/08/23 0955 120/78     Systolic BP Percentile --      Diastolic BP Percentile --      Pulse Rate  02/08/23 0955 87     Resp 02/08/23 0955 16     Temp 02/08/23 0955 98.4 F (36.9 C)     Temp Source 02/08/23 0955 Oral     SpO2 02/08/23 0955 97 %     Weight --      Height --      Head Circumference --      Peak Flow --      Pain Score 02/08/23 0958 4     Pain Loc --      Pain Education --      Exclude from Growth Chart --    No data found.  Updated Vital Signs BP 120/78   Pulse 87   Temp 98.4 F (36.9 C) (Oral)   Resp 16   SpO2 97%      Physical Exam Constitutional:      General: He is not in acute distress.    Appearance: He is well-developed. He is ill-appearing.     Comments: Patient appears tired  HENT:     Head: Normocephalic and atraumatic.     Right Ear: Tympanic membrane and ear canal normal.     Left Ear: Tympanic membrane and ear canal normal.     Nose: No congestion or rhinorrhea.     Mouth/Throat:     Mouth: Mucous membranes are dry.     Pharynx: No posterior oropharyngeal erythema.     Comments: Saliva is foamy Eyes:     Conjunctiva/sclera: Conjunctivae normal.     Pupils: Pupils are equal, round, and reactive to light.  Cardiovascular:     Rate and Rhythm: Normal rate and regular rhythm.     Heart sounds: Normal heart sounds.  Pulmonary:     Effort: Pulmonary effort is normal. No respiratory distress.     Breath sounds: Wheezing present.     Comments: Inspiratory wheeze throughout both lungs After nebulizer treatment his lungs are clear Abdominal:     General: There is no distension.     Palpations: Abdomen is soft.  Musculoskeletal:        General: Normal range of motion.     Cervical back: Normal range of motion.  Lymphadenopathy:     Cervical: No cervical adenopathy.  Skin:    General: Skin is warm and dry.  Neurological:     Mental Status: He is alert.      UC Treatments /  Results  Labs (all labs ordered are listed, but only abnormal results are displayed) Labs Reviewed - No data to display  EKG   Radiology No results found.  Procedures Procedures (including critical care time)  Medications Ordered in UC Medications - No data to display  Initial Impression / Assessment and Plan / UC Course  I have reviewed the triage vital signs and the nursing notes.  Pertinent labs & imaging results that were available during my care of the patient were reviewed by me and considered in my medical decision making (see chart for details).     Patient is on fluid restriction for his chronic congestive heart failure.  He needs to increase his fluids moderately well he has a respiratory infection in order to mobilize mucus in his chest.  Discussed increased use of albuterol also to improve symptoms and coughing.  Follow-up with PCP if not improved by next week Final Clinical Impressions(s) / UC Diagnoses   Final diagnoses:  Mild intermittent asthma with exacerbation  Community acquired pneumonia of right upper lobe of lung  Discharge Instructions      A moderate increase in fluids would help Use inhaler daily for the wheezing.  This will help both with coughing and to cough up sputum Use either Tessalon with Mucinex, or Tussionex for cough See your doctor if not improving by next week     ED Prescriptions   None    PDMP not reviewed this encounter.   Eustace Moore, MD 02/08/23 1409

## 2023-02-08 NOTE — ED Triage Notes (Signed)
Dx with pna on 12/14, here for recheck. Feel slightly better today, felt very ill yesterday with coughing, wheezing (sp02 was around 91-90%). No fever. Last night took mucinex.

## 2023-02-08 NOTE — Discharge Instructions (Signed)
A moderate increase in fluids would help Use inhaler daily for the wheezing.  This will help both with coughing and to cough up sputum Use either Tessalon with Mucinex, or Tussionex for cough See your doctor if not improving by next week

## 2023-02-20 ENCOUNTER — Encounter (HOSPITAL_COMMUNITY): Payer: Self-pay

## 2023-02-20 ENCOUNTER — Ambulatory Visit (HOSPITAL_COMMUNITY)
Admission: RE | Admit: 2023-02-20 | Discharge: 2023-02-20 | Disposition: A | Discharge status: Home / Self Care | Payer: BC Managed Care – PPO | Source: Ambulatory Visit | Attending: Family Medicine | Admitting: Family Medicine

## 2023-02-20 VITALS — BP 118/84 | HR 79 | Ht 74.0 in | Wt 219.2 lb

## 2023-02-20 DIAGNOSIS — I2510 Atherosclerotic heart disease of native coronary artery without angina pectoris: Secondary | ICD-10-CM | POA: Diagnosis not present

## 2023-02-20 DIAGNOSIS — R002 Palpitations: Secondary | ICD-10-CM | POA: Insufficient documentation

## 2023-02-20 DIAGNOSIS — I493 Ventricular premature depolarization: Secondary | ICD-10-CM

## 2023-02-20 DIAGNOSIS — Z79899 Other long term (current) drug therapy: Secondary | ICD-10-CM | POA: Insufficient documentation

## 2023-02-20 DIAGNOSIS — I255 Ischemic cardiomyopathy: Secondary | ICD-10-CM | POA: Insufficient documentation

## 2023-02-20 DIAGNOSIS — Z95810 Presence of automatic (implantable) cardiac defibrillator: Secondary | ICD-10-CM | POA: Insufficient documentation

## 2023-02-20 DIAGNOSIS — Z955 Presence of coronary angioplasty implant and graft: Secondary | ICD-10-CM | POA: Insufficient documentation

## 2023-02-20 DIAGNOSIS — I252 Old myocardial infarction: Secondary | ICD-10-CM | POA: Diagnosis not present

## 2023-02-20 DIAGNOSIS — Z7902 Long term (current) use of antithrombotics/antiplatelets: Secondary | ICD-10-CM | POA: Insufficient documentation

## 2023-02-20 DIAGNOSIS — E785 Hyperlipidemia, unspecified: Secondary | ICD-10-CM | POA: Diagnosis not present

## 2023-02-20 DIAGNOSIS — I5022 Chronic systolic (congestive) heart failure: Secondary | ICD-10-CM

## 2023-02-20 DIAGNOSIS — I4729 Other ventricular tachycardia: Secondary | ICD-10-CM

## 2023-02-20 LAB — BASIC METABOLIC PANEL
Anion gap: 10 (ref 5–15)
BUN: 7 mg/dL (ref 6–20)
CO2: 25 mmol/L (ref 22–32)
Calcium: 9.1 mg/dL (ref 8.9–10.3)
Chloride: 105 mmol/L (ref 98–111)
Creatinine, Ser: 0.94 mg/dL (ref 0.61–1.24)
GFR, Estimated: >60 mL/min (ref >60)
Glucose, Bld: 82 mg/dL (ref 70–99)
Potassium: 4.4 mmol/L (ref 3.5–5.1)
Sodium: 140 mmol/L (ref 135–145)

## 2023-02-20 LAB — LIPID PANEL
Cholesterol: 256 mg/dL — ABNORMAL HIGH (ref 0–200)
HDL: 47 mg/dL (ref >40)
LDL Cholesterol: 171 mg/dL — ABNORMAL HIGH (ref 0–99)
Total CHOL/HDL Ratio: 5.4 {ratio}
Triglycerides: 189 mg/dL — ABNORMAL HIGH (ref <150)
VLDL: 38 mg/dL (ref 0–40)

## 2023-02-20 LAB — BRAIN NATRIURETIC PEPTIDE: B Natriuretic Peptide: 51.6 pg/mL (ref 0.0–100.0)

## 2023-02-20 MED ORDER — POTASSIUM CHLORIDE CRYS ER 20 MEQ PO TBCR: 20.0000 meq | EXTENDED_RELEASE_TABLET | Freq: Every day | ORAL | 6 refills | Status: AC | PRN

## 2023-02-20 MED ORDER — TORSEMIDE 20 MG PO TABS: 20.0000 mg | ORAL_TABLET | Freq: Every day | ORAL | 6 refills | Status: AC | PRN

## 2023-02-20 NOTE — Patient Instructions (Addendum)
 Thank you for coming in today  If you had labs drawn today, any labs that are abnormal the clinic will call you No news is good news  Medications: Take Torsemide  20 mg daily for 5 days Take Potassium 20 meq daily with lasix  doses for 5 days   Follow up appointments:  Your physician recommends that you schedule a follow-up appointment in:  4 months With Dr. Rolan with echocardiogram  You will receive a reminder letter in the mail a few months in advance. If you don't receive a letter, please call our office to schedule the follow-up appointment.   Your physician has requested that you have an echocardiogram. Echocardiography is a painless test that uses sound waves to create images of your heart. It provides your doctor with information about the size and shape of your heart and how well your heart's chambers and valves are working. This procedure takes approximately one hour. There are no restrictions for this procedure.      Do the following things EVERYDAY: Weigh yourself in the morning before breakfast. Write it down and keep it in a log. Take your medicines as prescribed Eat low salt foods--Limit salt (sodium) to 2000 mg per day.  Stay as active as you can everyday Limit all fluids for the day to less than 2 liters   At the [facility], you and your health needs are our priority. As part of our continuing mission to provide you with exceptional heart care, we have created designated Provider Care Teams. These Care Teams include your primary Cardiologist (physician) and Advanced Practice Providers (APPs- Physician Assistants and Nurse Practitioners) who all work together to provide you with the care you need, when you need it.   You may see any of the following providers on your designated Care Team at your next follow up: Dr Toribio Fuel Dr Ezra Rolan Dr. Ria Gardenia Greig Lenetta, NP Caffie Shed, [place] [facility] [place], [place] Beckey Coe, NP Tinnie Redman, PharmD   Please be sure to bring in all your medications bottles to every appointment.    Thank you for choosing [facility]  If you have any questions or concerns before your next appointment please send us  a message through [place] or call our office at [phone].    TO LEAVE A MESSAGE FOR THE NURSE SELECT OPTION 2, PLEASE LEAVE A MESSAGE INCLUDING: YOUR NAME DATE OF BIRTH CALL BACK NUMBER REASON FOR CALL**this is important as we prioritize the call backs  YOU WILL RECEIVE A CALL BACK THE SAME DAY AS LONG AS YOU CALL BEFORE 4:00 PM
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
## 2023-02-21 ENCOUNTER — Telehealth (HOSPITAL_COMMUNITY): Payer: Self-pay | Admitting: *Deleted

## 2023-02-21 NOTE — Telephone Encounter (Signed)
 Called patient per [facility] with following:  Labs stable but LDL, total cholesterol and TGs elevated. Please call and confirm that he has been taking his Repatha , Zetia  10 daily, and Vascepta 2 g bid.  Pt reports he has been sick and has not been taking his Vascepta or Repatha , but will start back on both now.

## 2023-03-03 ENCOUNTER — Ambulatory Visit: Payer: BC Managed Care – PPO | Attending: Internal Medicine

## 2023-03-03 DIAGNOSIS — I5022 Chronic systolic (congestive) heart failure: Secondary | ICD-10-CM

## 2023-03-03 DIAGNOSIS — Z95810 Presence of automatic (implantable) cardiac defibrillator: Secondary | ICD-10-CM

## 2023-03-04 ENCOUNTER — Ambulatory Visit (INDEPENDENT_AMBULATORY_CARE_PROVIDER_SITE_OTHER): Payer: BC Managed Care – PPO

## 2023-03-04 DIAGNOSIS — I255 Ischemic cardiomyopathy: Secondary | ICD-10-CM

## 2023-03-04 NOTE — Progress Notes (Signed)
 EPIC Encounter for ICM Monitoring  Patient Name: Jason Hogan is a 48 y.o. male Date: 03/04/2023 Primary Care Physican: Norleen Lynwood ORN, MD Primary Cardiologist: Nishan/McLean Electrophysiologist: Fernande 03/05/2023 Weight:  unknown       Check for HF clinic as requested by Harlene Gainer, NP following 1/2 OV.   Attempted call to patient and unable to reach.  Transmission results reviewed.   Instructed at 1/2 HF OV to take PRN Lasix  with potassium x 5 days (through 1/7) and then return to PRN.     CorVue thoracic impedance suggesting dryness starting 1/9.   Prescribed:  Torsemide  20 mg take 1 tablet(s) (20 mg total) by mouth as needed. Metolazone  5 mg take 1 tablet by mouth as needed (for edema)  Potassium 20 mEq take 1 tablet(s) (20 mEq total) by mouth as needed when taking Torsemide . Spironolactone  25 mg take 1 tablet daily  Labs: 02/20/2023 Creatinine 0.94, BUN 7, Potassium 4.4, Sodium 140, GFR >60  A complete set of results can be found in Results Review.  Recommendations:  Pt currently in ED 1/15 with diarrhea and abdominal pain for the past few days. Copy sent to Harlene Gainer, NP at [facility] clinic  Follow-up plan: ICM clinic phone appointments on 03/11/2023 to recheck fluid levels.   91 day device clinic remote transmission 06/03/2023.    EP/Cardiology Office Visits: 03/31/2023 with with Jodie Passey PA.    Copy of ICM check sent to Dr. Fernande.   3 month ICM trend: 03/03/2023.    12-14 Month ICM trend:     Mitzie GORMAN Garner, RN 03/04/2023 8:00 AM

## 2023-03-05 ENCOUNTER — Other Ambulatory Visit: Payer: Self-pay

## 2023-03-05 ENCOUNTER — Ambulatory Visit: Payer: BC Managed Care – PPO

## 2023-03-05 ENCOUNTER — Emergency Department (HOSPITAL_BASED_OUTPATIENT_CLINIC_OR_DEPARTMENT_OTHER)
Admission: EM | Admit: 2023-03-05 | Discharge: 2023-03-05 | Disposition: A | Discharge status: Home / Self Care | Payer: BC Managed Care – PPO | Attending: Emergency Medicine | Admitting: Emergency Medicine

## 2023-03-05 ENCOUNTER — Emergency Department (HOSPITAL_BASED_OUTPATIENT_CLINIC_OR_DEPARTMENT_OTHER): Payer: BC Managed Care – PPO

## 2023-03-05 ENCOUNTER — Encounter (HOSPITAL_BASED_OUTPATIENT_CLINIC_OR_DEPARTMENT_OTHER): Payer: Self-pay

## 2023-03-05 DIAGNOSIS — Z951 Presence of aortocoronary bypass graft: Secondary | ICD-10-CM | POA: Diagnosis not present

## 2023-03-05 DIAGNOSIS — R1031 Right lower quadrant pain: Secondary | ICD-10-CM | POA: Diagnosis not present

## 2023-03-05 DIAGNOSIS — R000 Tachycardia, unspecified: Secondary | ICD-10-CM | POA: Diagnosis not present

## 2023-03-05 DIAGNOSIS — I110 Hypertensive heart disease with heart failure: Secondary | ICD-10-CM | POA: Insufficient documentation

## 2023-03-05 DIAGNOSIS — N3289 Other specified disorders of bladder: Secondary | ICD-10-CM | POA: Diagnosis not present

## 2023-03-05 DIAGNOSIS — Z79899 Other long term (current) drug therapy: Secondary | ICD-10-CM | POA: Diagnosis not present

## 2023-03-05 DIAGNOSIS — I5022 Chronic systolic (congestive) heart failure: Secondary | ICD-10-CM | POA: Diagnosis not present

## 2023-03-05 DIAGNOSIS — Z87891 Personal history of nicotine dependence: Secondary | ICD-10-CM | POA: Insufficient documentation

## 2023-03-05 DIAGNOSIS — R197 Diarrhea, unspecified: Secondary | ICD-10-CM | POA: Insufficient documentation

## 2023-03-05 DIAGNOSIS — Z7982 Long term (current) use of aspirin: Secondary | ICD-10-CM | POA: Diagnosis not present

## 2023-03-05 DIAGNOSIS — Z7902 Long term (current) use of antithrombotics/antiplatelets: Secondary | ICD-10-CM | POA: Insufficient documentation

## 2023-03-05 DIAGNOSIS — N200 Calculus of kidney: Secondary | ICD-10-CM | POA: Diagnosis not present

## 2023-03-05 DIAGNOSIS — Z7951 Long term (current) use of inhaled steroids: Secondary | ICD-10-CM | POA: Insufficient documentation

## 2023-03-05 DIAGNOSIS — J45901 Unspecified asthma with (acute) exacerbation: Secondary | ICD-10-CM | POA: Insufficient documentation

## 2023-03-05 DIAGNOSIS — I2510 Atherosclerotic heart disease of native coronary artery without angina pectoris: Secondary | ICD-10-CM | POA: Insufficient documentation

## 2023-03-05 DIAGNOSIS — R0789 Other chest pain: Secondary | ICD-10-CM | POA: Insufficient documentation

## 2023-03-05 DIAGNOSIS — K4020 Bilateral inguinal hernia, without obstruction or gangrene, not specified as recurrent: Secondary | ICD-10-CM | POA: Diagnosis not present

## 2023-03-05 DIAGNOSIS — R0602 Shortness of breath: Secondary | ICD-10-CM | POA: Diagnosis not present

## 2023-03-05 LAB — CBC
HCT: 59.5 % — ABNORMAL HIGH (ref 39.0–52.0)
Hemoglobin: 18.9 g/dL — ABNORMAL HIGH (ref 13.0–17.0)
MCH: 25.9 pg — ABNORMAL LOW (ref 26.0–34.0)
MCHC: 31.8 g/dL (ref 30.0–36.0)
MCV: 81.4 fL (ref 80.0–100.0)
Platelets: 288 10*3/uL (ref 150–400)
RBC: 7.31 MIL/uL — ABNORMAL HIGH (ref 4.22–5.81)
RDW: 17.8 % — ABNORMAL HIGH (ref 11.5–15.5)
WBC: 8.9 10*3/uL (ref 4.0–10.5)
nRBC: 0 % (ref 0.0–0.2)

## 2023-03-05 LAB — URINALYSIS, ROUTINE W REFLEX MICROSCOPIC
Glucose, UA: NEGATIVE mg/dL
Hgb urine dipstick: NEGATIVE
Ketones, ur: 15 mg/dL — AB
Leukocytes,Ua: NEGATIVE
Nitrite: NEGATIVE
Protein, ur: 30 mg/dL — AB
Specific Gravity, Urine: 1.01 (ref 1.005–1.030)
pH: 6 (ref 5.0–8.0)

## 2023-03-05 LAB — COMPREHENSIVE METABOLIC PANEL
ALT: 19 U/L (ref 0–44)
AST: 14 U/L — ABNORMAL LOW (ref 15–41)
Albumin: 3.8 g/dL (ref 3.5–5.0)
Alkaline Phosphatase: 70 U/L (ref 38–126)
Anion gap: 10 (ref 5–15)
BUN: 13 mg/dL (ref 6–20)
CO2: 19 mmol/L — ABNORMAL LOW (ref 22–32)
Calcium: 9.3 mg/dL (ref 8.9–10.3)
Chloride: 107 mmol/L (ref 98–111)
Creatinine, Ser: 1.1 mg/dL (ref 0.61–1.24)
GFR, Estimated: >60 mL/min (ref >60)
Glucose, Bld: 98 mg/dL (ref 70–99)
Potassium: 3.6 mmol/L (ref 3.5–5.1)
Sodium: 136 mmol/L (ref 135–145)
Total Bilirubin: 1.2 mg/dL (ref 0.0–1.2)
Total Protein: 6.8 g/dL (ref 6.5–8.1)

## 2023-03-05 LAB — URINALYSIS, MICROSCOPIC (REFLEX)
RBC / HPF: NONE SEEN RBC/hpf (ref 0–5)
WBC, UA: NONE SEEN WBC/hpf (ref 0–5)

## 2023-03-05 LAB — LIPASE, BLOOD: Lipase: 24 U/L (ref 11–51)

## 2023-03-05 LAB — BRAIN NATRIURETIC PEPTIDE: B Natriuretic Peptide: 64.2 pg/mL (ref 0.0–100.0)

## 2023-03-05 LAB — C DIFFICILE QUICK SCREEN W PCR REFLEX
C Diff antigen: NEGATIVE
C Diff interpretation: NOT DETECTED
C Diff toxin: NEGATIVE

## 2023-03-05 LAB — MAGNESIUM: Magnesium: 1.7 mg/dL (ref 1.7–2.4)

## 2023-03-05 LAB — TROPONIN I (HIGH SENSITIVITY): Troponin I (High Sensitivity): 6 ng/L (ref <18)

## 2023-03-05 MED ORDER — MORPHINE SULFATE (PF) 4 MG/ML IV SOLN
4.0000 mg | Freq: Once | INTRAVENOUS | Status: AC
  Administered 2023-03-05: 4 mg via INTRAVENOUS
  Filled 2023-03-05: qty 1

## 2023-03-05 MED ORDER — MAGNESIUM SULFATE 2 GM/50ML IV SOLN
2.0000 g | Freq: Once | INTRAVENOUS | Status: AC
  Administered 2023-03-05: 2 g via INTRAVENOUS
  Filled 2023-03-05: qty 50

## 2023-03-05 MED ORDER — IOHEXOL 300 MG/ML  SOLN
100.0000 mL | Freq: Once | INTRAMUSCULAR | Status: AC | PRN
  Administered 2023-03-05: 100 mL via INTRAVENOUS

## 2023-03-05 MED ORDER — POTASSIUM CHLORIDE CRYS ER 20 MEQ PO TBCR
40.0000 meq | EXTENDED_RELEASE_TABLET | Freq: Once | ORAL | Status: AC
  Administered 2023-03-05: 40 meq via ORAL
  Filled 2023-03-05: qty 2

## 2023-03-05 MED ORDER — ONDANSETRON HCL 4 MG/2ML IJ SOLN
4.0000 mg | Freq: Once | INTRAMUSCULAR | Status: AC
  Administered 2023-03-05: 4 mg via INTRAVENOUS
  Filled 2023-03-05: qty 2

## 2023-03-05 MED ORDER — SODIUM CHLORIDE 0.9 % IV BOLUS
750.0000 mL | Freq: Once | INTRAVENOUS | Status: AC
  Administered 2023-03-05: 750 mL via INTRAVENOUS

## 2023-03-05 MED ORDER — SODIUM CHLORIDE 0.9 % IV BOLUS
500.0000 mL | Freq: Once | INTRAVENOUS | Status: AC
  Administered 2023-03-05: 500 mL via INTRAVENOUS

## 2023-03-05 MED ORDER — HYDROCODONE-ACETAMINOPHEN 5-325 MG PO TABS
1.0000 | ORAL_TABLET | ORAL | 0 refills | Status: DC | PRN
Start: 2023-03-05 — End: 2023-10-21

## 2023-03-05 NOTE — ED Notes (Signed)

## 2023-03-05 NOTE — ED Notes (Signed)
Patient ambulatory to the BR 

## 2023-03-05 NOTE — ED Provider Notes (Signed)
[place] [facility] Provider Note  CSN: [id number] Arrival date & time: 03/05/23 7829  Chief Complaint(s) Abdominal Pain  HPI Jason Hogan is a 48 y.o. male history of coronary artery disease, ischemic cardiomyopathy, hypertension, hyperlipidemia, VT status post ICD presenting to the emergency department with diarrhea.  Patient reports abdominal pain and diarrhea for the past few days.  Reports that he is having diarrhea almost every hour.  He has had some nausea but no vomiting.  He reports abdominal pain is diffuse.  No leg swelling.  Reports of some mild shortness of breath.  He also reports some mild anterior chest pain, not pleuritic, worse with movement, does not feel similar to prior episodes of heart attack or ACS.  He reports that he has not been taking his diuretics for the past few days since he has been having so much diarrhea   Past Medical History Past Medical History:  Diagnosis Date   Acute Myocardial Infarction 08/18/2009   Qualifier: Diagnosis of  By: Zachery Dauer, Kimalexis     Allergic rhinitis, cause unspecified 10/03/2011   Anxiety 10/03/2011   Asthma 09/20/2006   Qualifier: Diagnosis of  By: Jonny Ruiz MD, Len Blalock    Automatic implantable cardiac defibrillator in situ 01/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 Qualifier: Diagnosis of  By: Eden Emms, MD, Harrington Challenger    CAD (coronary artery disease)    a. large anterior MI 2011 s/p PTCA/DES to 100%. b. occluded LAD with occluded collateralized silent RCA, balloon angioplasty to stented segment of LAD and DES x1 to mid circumflex in 10/2017.   CHF (congestive heart failure) (HCC)    Cholelithiasis 06/22/2007   Qualifier: Diagnosis of  By: Maris Berger    Chronic systolic heart failure (HCC) 09/18/2009   Qualifier: Diagnosis of  By: Kem Parkinson     Depressive disorder, not elsewhere classified 06/22/2007   Qualifier: Diagnosis of  By: Maris Berger     History of gout    History of kidney stones    Hyperlipidemia 81/2011   Hypertension    Impaired glucose tolerance 10/05/2012   Insomnia, unspecified 06/22/2007   Qualifier: Diagnosis of  By: Jonny Ruiz MD, Len Blalock    Ischemic cardiomyopathy 10/20/2009   Qualifier: Diagnosis of  By: Graciela Husbands, MD, Susie Cassette    Low testosterone 10/14/2013   Migraine Headache 09/18/2009   Qualifier: Diagnosis of  By: Kem Parkinson     Renal calculus    Renal Insufficiency 09/18/2009   Qualifier: Diagnosis of  By: Kem Parkinson     Respiratory failure (HCC) 08/2009   Rhabdomyolysis 09/18/2009   Qualifier: Diagnosis of  By: Kem Parkinson     VT (ventricular tachycardia) [facility])    Patient Active Problem List   Diagnosis Date Noted   ARF (acute renal failure) (HCC) 03/05/2021   Intractable diarrhea 03/04/2021   Vitamin D deficiency 05/02/2020   Polycythemia 05/02/2020   Nonallopathic lesion of sacral region 01/26/2020   Hypogonadism in male 04/21/2019   Allergy 04/21/2019   Plantar fasciitis of left foot 02/25/2019   Asthma exacerbation 02/11/2019   Statin myopathy 11/05/2018   Left wrist fracture, closed, initial encounter 01/01/2018   Ischemic cardiomyopathy s/p AICD 11/25/2017   Hypertension 11/25/2017   CAD (coronary artery disease), native coronary artery 11/15/2017   Unstable angina (HCC)    Labral tear of shoulder, right, initial encounter 03/04/2017   Acute bursitis of right shoulder 02/04/2017   Hyperestrogenism 10/04/2016  Fatigue 08/16/2016   Hematuria 07/17/2016   Chest pain 07/04/2016   Dyspnea 07/04/2016   Low back pain 07/04/2016   Cervical radiculopathy at C8 03/08/2015   Polyarthralgia 11/17/2014   Pes anserine bursitis 11/17/2014   Epistaxis, recurrent 08/18/2014   Allergic rhinitis 08/18/2014   Shoulder bursitis 08/01/2014   Tachycardia 04/19/2014   Gout 12/06/2013   Insomnia 10/14/2013   Low testosterone 10/14/2013   Impaired glucose tolerance 10/05/2012    Reduced libido 01/07/2012   Anxiety 10/03/2011   Encounter for well adult exam with abnormal findings 10/02/2011   Implantable cardioverter-defibrillator (ICD) in situ 01/17/2010   Migraine headache 09/18/2009   Acute myocardial infarction (HCC) 09/18/2009   Chronic systolic heart failure (HCC) 09/18/2009   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/18/2009   Rhabdomyolysis 09/18/2009   Hyperlipidemia 06/22/2007   Depression 06/22/2007   Sleep disorder 06/22/2007   Asthma 09/20/2006   RENAL CALCULUS, HX OF 09/20/2006   Home Medication(s) Prior to Admission medications   Medication Sig Start Date End Date Taking? Authorizing Provider  clopidogrel (PLAVIX) 75 MG tablet TAKE 1 TABLET BY MOUTH DAILY 10/07/22  Yes Laurey Morale, MD  HYDROcodone-acetaminophen (NORCO/VICODIN) 5-325 MG tablet Take 1 tablet by mouth every 4 (four) hours as needed. 03/05/23  Yes Jacalyn Lefevre, MD  albuterol (VENTOLIN HFA) 108 (90 Base) MCG/ACT inhaler Inhale 1-2 puffs into the lungs every 6 (six) hours as needed for wheezing or shortness of breath. 02/24/20   Corwin Levins, MD  allopurinol (ZYLOPRIM) 100 MG tablet TAKE 2 TABLETS BY MOUTH DAILY 11/06/22   Corwin Levins, MD  ALPRAZolam Prudy Feeler) 0.5 MG tablet TAKE ONE TABLET BY MOUTH DAILY AS NEEDED FOR ANXIETY 02/13/21   Corwin Levins, MD  aspirin EC 81 MG tablet Take 81 mg by mouth daily.    [provider]  azelastine (ASTELIN) 0.1 % nasal spray Place 1 spray into both nostrils daily as needed for rhinitis. 08/14/19   [provider]  budesonide-formoterol (SYMBICORT) 80-4.5 MCG/ACT inhaler Inhale 2 puffs into the lungs daily as needed (wheezing). 07/01/17   [provider]  carvedilol (COREG) 25 MG tablet Take 1 tablet (25 mg total) by mouth 2 (two) times daily. 12/05/22   Laurey Morale, MD  dapagliflozin propanediol (FARXIGA) 10 MG TABS tablet TAKE ONE TABLET BY MOUTH DAILY BEFORE BREAKFAST 08/07/22   Laurey Morale, MD   Dulaglutide (TRULICITY) 1.5 MG/0.5ML SOPN Inject 1.5 mg into the skin once a week. Fridays    [provider]  ENTRESTO 97-103 MG TAKE ONE TABLET BY MOUTH TWICE A DAY 05/15/22   Laurey Morale, MD  Evolocumab (REPATHA SURECLICK) 140 MG/ML SOAJ Inject 140 mg into the skin every 14 (fourteen) days. 03/04/22   Laurey Morale, MD  ezetimibe (ZETIA) 10 MG tablet TAKE 1 TABLET BY MOUTH DAILY 12/13/22   Laurey Morale, MD  metolazone (ZAROXOLYN) 5 MG tablet TAKE 1 TABLET (5 MG TOTAL) BY MOUTH DAILY AS NEEDED (FOR EDEMA). 10/08/16   Wendall Stade, MD  mexiletine (MEXITIL) 200 MG capsule TAKE 1 CAPSULE BY MOUTH 2 TIMES A DAY 11/06/22   Laurey Morale, MD  potassium chloride SA (KLOR-CON M) 20 MEQ tablet Take 1 tablet (20 mEq total) by mouth daily as needed (when taking torsemide). 02/20/23   Jacklynn Ganong, FNP  sertraline (ZOLOFT) 100 MG tablet Take 1 tablet (100 mg total) by mouth daily. 03/04/22   Corwin Levins, MD  spironolactone (ALDACTONE) 25  MG tablet TAKE 1 TABLET BY MOUTH DAILY 12/13/22   Laurey Morale, MD  tiZANidine (ZANAFLEX) 4 MG tablet TAKE ONE TABLET BY MOUTH TWICE A DAY AS NEEDED Annual appt due in July must see provider for future refills 08/12/22   Corwin Levins, MD  torsemide (DEMADEX) 20 MG tablet Take 1 tablet (20 mg total) by mouth daily as needed. 02/20/23   [place], Anderson Malta, FNP  VASCEPA 1 g capsule Take 2 capsules (2 g total) by mouth 2 (two) times daily. 08/19/22   Laurey Morale, MD                                                                                                                                    Past Surgical History Past Surgical History:  Procedure Laterality Date   CARDIAC CATHETERIZATION N/A 05/17/2015   Procedure: Left Heart Cath and Coronary Angiography;  Surgeon: Wendall Stade, MD;  Location: [facility] INVASIVE CV LAB;  Service: Cardiovascular;  Laterality: N/A;   CARDIAC CATHETERIZATION  2012   CORONARY ANGIOPLASTY WITH STENT PLACEMENT  2011    CORONARY STENT INTERVENTION N/A 11/14/2017   Procedure: CORONARY STENT INTERVENTION;  Surgeon: Kathleene Hazel, MD;  Location: [facility] INVASIVE CV LAB;  Service: Cardiovascular;  Laterality: N/A;   ICD GENERATOR CHANGEOUT N/A 06/04/2021   Procedure: ICD GENERATOR CHANGEOUT;  Surgeon: Duke Salvia, MD;  Location[facility] INVASIVE CV LAB;  Service: Cardiovascular;  Laterality: N/A;   ICD IMPLANT  11/2009   "w/pacing function"   RIGHT/LEFT HEART CATH AND CORONARY ANGIOGRAPHY N/A 11/14/2017   Procedure: RIGHT/LEFT HEART CATH AND CORONARY ANGIOGRAPHY;  Surgeon: Kathleene Hazel, MD;  Location: [facility] INVASIVE CV LAB;  Service: Cardiovascular;  Laterality: N/A;   Family History Family History  Problem Relation Age of Onset   Heart attack Father    Heart disease Other    Diabetes Other    Hypothyroidism Other     Social History Social History   Tobacco Use   Smoking status: Former    Current packs/day: 0.00    Average packs/day: 0.1 packs/day for 2.0 years (0.2 ttl pk-yrs)    Types: Cigarettes    Start date: 02/19/1992    Quit date: 02/18/1994    Years since quitting: 29.0   Smokeless tobacco: Never  Vaping Use   Vaping status: Never Used  Substance Use Topics   Alcohol use: Not Currently    Comment: 11/25/2017 "couple beers/month; if that"   Drug use: Never   Allergies Levaquin [levofloxacin], Ace inhibitors, Crestor [rosuvastatin], Lunesta [eszopiclone], and Zocor [simvastatin]  Review of Systems Review of Systems  All other systems reviewed and are negative.   Physical Exam Vital Signs  I have reviewed the triage vital signs BP (!) 143/92   Pulse 98   Temp 98.2 F (36.8 C) (Oral)   Resp 17   SpO2 100%  Physical Exam Vitals and nursing note  reviewed.  Constitutional:      General: He is not in acute distress.    Appearance: Normal appearance.  HENT:     Mouth/Throat:     Mouth: Mucous membranes are dry.  Eyes:     Conjunctiva/sclera: Conjunctivae normal.   Cardiovascular:     Rate and Rhythm: Regular rhythm. Tachycardia present.  Pulmonary:     Effort: Pulmonary effort is normal. No respiratory distress.     Breath sounds: Normal breath sounds.  Abdominal:     General: Abdomen is flat.     Palpations: Abdomen is soft.     Tenderness: There is abdominal tenderness (RLQ).  Musculoskeletal:     Right lower leg: No edema.     Left lower leg: No edema.  Skin:    General: Skin is warm and dry.     Capillary Refill: Capillary refill takes less than 2 seconds.  Neurological:     Mental Status: He is alert and oriented to person, place, and time. Mental status is at baseline.  Psychiatric:        Mood and Affect: Mood normal.        Behavior: Behavior normal.     ED Results and Treatments Labs (all labs ordered are listed, but only abnormal results are displayed) Labs Reviewed  COMPREHENSIVE METABOLIC PANEL - Abnormal; Notable for the following components:      Result Value   CO2 19 (*)    AST 14 (*)    All other components within normal limits  CBC - Abnormal; Notable for the following components:   RBC 7.31 (*)    Hemoglobin 18.9 (*)    HCT 59.5 (*)    MCH 25.9 (*)    RDW 17.8 (*)    All other components within normal limits  URINALYSIS, ROUTINE W REFLEX MICROSCOPIC - Abnormal; Notable for the following components:   Bilirubin Urine SMALL (*)    Ketones, ur 15 (*)    Protein, ur 30 (*)    All other components within normal limits  URINALYSIS, MICROSCOPIC (REFLEX) - Abnormal; Notable for the following components:   Bacteria, UA RARE (*)    All other components within normal limits  C DIFFICILE QUICK SCREEN W PCR REFLEX    GASTROINTESTINAL PANEL BY PCR, STOOL (REPLACES STOOL CULTURE)  LIPASE, BLOOD  MAGNESIUM  BRAIN NATRIURETIC PEPTIDE  TROPONIN I (HIGH SENSITIVITY)                                                                                                                          Radiology No results  found.  Pertinent labs & imaging results that were available during my care of the patient were reviewed by me and considered in my medical decision making (see MDM for details).  Medications Ordered in ED Medications  sodium chloride 0.9 % bolus 750 mL (0 mLs Intravenous Stopped 03/05/23 1146)  morphine (PF) 4 MG/ML injection 4 mg (4 mg Intravenous Given 03/05/23 1026)  ondansetron (ZOFRAN) injection 4 mg (4 mg Intravenous Given 03/05/23 1026)  iohexol (OMNIPAQUE) 300 MG/ML solution 100 mL (100 mLs Intravenous Contrast Given 03/05/23 1145)  potassium chloride SA (KLOR-CON M) CR tablet 40 mEq (40 mEq Oral Given 03/05/23 1237)  magnesium sulfate IVPB 2 g 50 mL (0 g Intravenous Stopped 03/05/23 1342)  sodium chloride 0.9 % bolus 500 mL (0 mLs Intravenous Stopped 03/05/23 1518)  morphine (PF) 4 MG/ML injection 4 mg (4 mg Intravenous Given 03/05/23 1530)                                                                                                                                     Procedures Procedures  (including critical care time)  Medical Decision Making / ED Course   MDM:  48 year old male presenting to the emergency department with abdominal pain, diarrhea.  Patient overall well-appearing, appears euvolemic.  No edema.  Exam with some abdominal tenderness.  Differential includes C. difficile, colitis, diverticulitis, abscess, foodborne illness, viral gastroenteritis.  Patient appears dry and reports some recent weight loss so we will give fluids as well as pain and nausea medication.  Will reassess after imaging and laboratory testing.  Clinical Course as of 03/07/23 1518  Wed Mar 05, 2023  1407 CT ABDOMEN PELVIS W CONTRAST [WS]  1516 Signed out to Dr. Particia Nearing pending C. Diff testing. Remainder of his workup is reassuring. Feels better with treatment [WS]    Clinical Course User Index [WS] Lonell Grandchild, MD     Additional history obtained:  -External records from  outside source obtained and reviewed including: Chart review including previous notes, labs, imaging, consultation notes including prior cardiology notes    Lab Tests: -I ordered, reviewed, and interpreted labs.   The pertinent results include:   Labs Reviewed  COMPREHENSIVE METABOLIC PANEL - Abnormal; Notable for the following components:      Result Value   CO2 19 (*)    AST 14 (*)    All other components within normal limits  CBC - Abnormal; Notable for the following components:   RBC 7.31 (*)    Hemoglobin 18.9 (*)    HCT 59.5 (*)    MCH 25.9 (*)    RDW 17.8 (*)    All other components within normal limits  URINALYSIS, ROUTINE W REFLEX MICROSCOPIC - Abnormal; Notable for the following components:   Bilirubin Urine SMALL (*)    Ketones, ur 15 (*)    Protein, ur 30 (*)    All other components within normal limits  URINALYSIS, MICROSCOPIC (REFLEX) - Abnormal; Notable for the following components:   Bacteria, UA RARE (*)    All other components within normal limits  C DIFFICILE QUICK SCREEN W PCR REFLEX    GASTROINTESTINAL PANEL BY PCR, STOOL (REPLACES STOOL CULTURE)  LIPASE, BLOOD  MAGNESIUM  BRAIN NATRIURETIC PEPTIDE  TROPONIN I (HIGH SENSITIVITY)    Notable for mild  low CO2, signs of hemoconcentration  EKG   EKG Interpretation Date/Time:  Wednesday March 05 2023 09:47:21 EST Ventricular Rate:  103 PR Interval:  188 QRS Duration:  98 QT Interval:  354 QTC Calculation: 464 R Axis:   88  Text Interpretation: Sinus tachycardia Multiple ventricular premature complexes Biatrial enlargement Abnormal lateral Q waves Confirmed by Alvino Blood ([id number]) on 03/05/2023 10:23:26 AM         Imaging Studies ordered: I ordered imaging studies including CXR, CT abdomen On my interpretation imaging demonstrates evidence of diarrheal illness I independently visualized and interpreted imaging. I agree with the radiologist interpretation   Medicines ordered and  prescription drug management: Meds ordered this encounter  Medications   sodium chloride 0.9 % bolus 750 mL   morphine (PF) 4 MG/ML injection 4 mg   ondansetron (ZOFRAN) injection 4 mg   iohexol (OMNIPAQUE) 300 MG/ML solution 100 mL   potassium chloride SA (KLOR-CON M) CR tablet 40 mEq   magnesium sulfate IVPB 2 g 50 mL   sodium chloride 0.9 % bolus 500 mL   morphine (PF) 4 MG/ML injection 4 mg   HYDROcodone-acetaminophen (NORCO/VICODIN) 5-325 MG tablet    Sig: Take 1 tablet by mouth every 4 (four) hours as needed.    Dispense:  10 tablet    Refill:  0    -I have reviewed the patients home medicines and have made adjustments as needed    Cardiac Monitoring: The patient was maintained on a cardiac monitor.  I personally viewed and interpreted the cardiac monitored which showed an underlying rhythm of: NSR w PVC  Social Determinants of Health:  Diagnosis or treatment significantly limited by social determinants of health: former smoker   Reevaluation: After the interventions noted above, I reevaluated the patient and found that their symptoms have improved  Co morbidities that complicate the patient evaluation  Past Medical History:  Diagnosis Date   Acute Myocardial Infarction 08/18/2009   Qualifier: Diagnosis of  By: Zachery Dauer, Kimalexis     Allergic rhinitis, cause unspecified 10/03/2011   Anxiety 10/03/2011   Asthma 09/20/2006   Qualifier: Diagnosis of  By: Jonny Ruiz MD, Len Blalock    Automatic implantable cardiac defibrillator in situ 01/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 Qualifier: Diagnosis of  By: Eden Emms, MD, Harrington Challenger    CAD (coronary artery disease)    a. large anterior MI 2011 s/p PTCA/DES to 100%. b. occluded LAD with occluded collateralized silent RCA, balloon angioplasty to stented segment of LAD and DES x1 to mid circumflex in 10/2017.   CHF (congestive heart failure) (HCC)    Cholelithiasis 06/22/2007   Qualifier: Diagnosis of  By: Maris Berger    Chronic systolic heart failure (HCC) 09/18/2009   Qualifier: Diagnosis of  By: Kem Parkinson     Depressive disorder, not elsewhere classified 06/22/2007   Qualifier: Diagnosis of  By: Maris Berger    History of gout    History of kidney stones    Hyperlipidemia 81/2011   Hypertension    Impaired glucose tolerance 10/05/2012   Insomnia, unspecified 06/22/2007   Qualifier: Diagnosis of  By: Jonny Ruiz MD, Len Blalock    Ischemic cardiomyopathy 10/20/2009   Qualifier: Diagnosis of  By: Graciela Husbands, MD, Susie Cassette    Low testosterone 10/14/2013   Migraine Headache 09/18/2009   Qualifier: Diagnosis of  By: Kem Parkinson     Renal calculus    Renal Insufficiency 09/18/2009   Qualifier: Diagnosis of  By: Kem Parkinson     Respiratory failure (HCC) 08/2009   Rhabdomyolysis 09/18/2009   Qualifier: Diagnosis of  By: Kem Parkinson     VT (ventricular tachycardia) (HCC)       Dispostion: Disposition decision including need for hospitalization was considered, and patient disposition pending at time of sign out.    Final Clinical Impression(s) / ED Diagnoses Final diagnoses:  Diarrhea, unspecified type     This chart was dictated using voice recognition software.  Despite best efforts to proofread,  errors can occur which can change the documentation meaning.    Lonell Grandchild, MD 03/07/23 [phone]

## 2023-03-05 NOTE — ED Notes (Addendum)
 Pt placed on 3LPM via [place] for runs of Vtach. Pt has history of VT, and has an AICD that has gone off in the past due to his condition. Chronic metabolite imbalance usually causes the issue.  Pt noticed palpitations and fluttering in his chest, but no acute distress. Pt warm and dry to touch. No SOB. Pt has callbell within reach. EDP and RT notified.

## 2023-03-05 NOTE — ED Provider Notes (Signed)
 Pt was signed out by Dr. Joli Neas pending c. Diff test.  This came back neg.  Pt is feeling better.  He is stable for d/c.  He is told to hold his diuretics for the next 24 hrs.  He is to return if worse.     Sueellen Emery, MD 03/05/23 1650

## 2023-03-05 NOTE — ED Triage Notes (Addendum)
 Pt presents with complaints of generalized abdominal pain since Saturday. Diarrhea every hour.Nausea denies vomiting. Reports bump smell "fermented". SOB since yesterday. Decrease weight 205 lbs to 194 lbs Completed antibiotics for pneumonia 2 weeks ago

## 2023-03-05 NOTE — ED Notes (Signed)
 ED Provider at bedside.

## 2023-03-06 LAB — GASTROINTESTINAL PANEL BY PCR, STOOL (REPLACES STOOL CULTURE)

## 2023-03-07 NOTE — Progress Notes (Signed)
  Received: 2 days ago Aquilla, Anderson Malta, FNP  Cecilie Heidel, Josephine Igo, RN Thank you, looks like ED MD told him to hold diuretics x 1 day.

## 2023-03-11 ENCOUNTER — Ambulatory Visit: Payer: BC Managed Care – PPO | Attending: Internal Medicine

## 2023-03-11 DIAGNOSIS — Z95810 Presence of automatic (implantable) cardiac defibrillator: Secondary | ICD-10-CM

## 2023-03-11 DIAGNOSIS — I5022 Chronic systolic (congestive) heart failure: Secondary | ICD-10-CM

## 2023-03-12 NOTE — Progress Notes (Signed)
EPIC Encounter for ICM Monitoring  Patient Name: Jason Hogan is a 48 y.o. male Date: 03/12/2023 Primary Care Physican: Corwin Levins, MD Primary Cardiologist: Nishan/McLean Electrophysiologist: Graciela Husbands 03/05/2023 Weight:  unknown                                                            Check for HF clinic as requested by Prince Rome, NP following 1/2 OV.   Transmission results reviewed.    Instructed at 1/2 HF OV to take PRN Lasix with potassium x 5 days (through 1/7) and then return to PRN.     CorVue thoracic impedance suggesting dryness starting 1/9 which correlates with ED visit for diarrhea.    Prescribed:  Torsemide 20 mg take 1 tablet(s) (20 mg total) by mouth as needed. Metolazone 5 mg take 1 tablet by mouth as needed (for edema)  Potassium 20 mEq take 1 tablet(s) (20 mEq total) by mouth as needed when taking Torsemide. Spironolactone 25 mg take 1 tablet daily   Labs: 02/20/2023 Creatinine 0.94, BUN 7, Potassium 4.4, Sodium 140, GFR >60  A complete set of results can be found in Results Review.   Recommendations:  Pt advised at ED discharge to hold diuretics.     Follow-up plan: No further ICM clinic phone appointments.   91 day device clinic remote transmission 06/03/2023.     EP/Cardiology Office Visits: 03/31/2023 with with Otilio Saber PA.     Copy of ICM check sent to Dr. Graciela Husbands.   3 month ICM trend: 03/11/2023.    12-14 Month ICM trend:     Karie Soda, RN 03/12/2023 9:38 AM

## 2023-03-19 ENCOUNTER — Other Ambulatory Visit (HOSPITAL_COMMUNITY): Payer: Self-pay | Admitting: Cardiology

## 2023-03-20 ENCOUNTER — Other Ambulatory Visit: Payer: Self-pay | Admitting: Internal Medicine

## 2023-03-24 ENCOUNTER — Encounter: Payer: Self-pay | Admitting: Family Medicine

## 2023-03-27 ENCOUNTER — Encounter: Payer: Self-pay | Admitting: Internal Medicine

## 2023-03-27 ENCOUNTER — Ambulatory Visit (INDEPENDENT_AMBULATORY_CARE_PROVIDER_SITE_OTHER): Payer: BC Managed Care – PPO | Admitting: Internal Medicine

## 2023-03-27 VITALS — BP 122/78 | HR 82 | Temp 98.5°F | Ht 74.0 in | Wt 214.0 lb

## 2023-03-27 DIAGNOSIS — Z23 Encounter for immunization: Secondary | ICD-10-CM | POA: Diagnosis not present

## 2023-03-27 DIAGNOSIS — R7302 Impaired glucose tolerance (oral): Secondary | ICD-10-CM | POA: Diagnosis not present

## 2023-03-27 DIAGNOSIS — E782 Mixed hyperlipidemia: Secondary | ICD-10-CM | POA: Diagnosis not present

## 2023-03-27 DIAGNOSIS — I5022 Chronic systolic (congestive) heart failure: Secondary | ICD-10-CM

## 2023-03-27 DIAGNOSIS — I10 Essential (primary) hypertension: Secondary | ICD-10-CM | POA: Diagnosis not present

## 2023-03-27 DIAGNOSIS — E559 Vitamin D deficiency, unspecified: Secondary | ICD-10-CM

## 2023-03-27 DIAGNOSIS — Z0001 Encounter for general adult medical examination with abnormal findings: Secondary | ICD-10-CM

## 2023-03-27 DIAGNOSIS — Z1211 Encounter for screening for malignant neoplasm of colon: Secondary | ICD-10-CM | POA: Diagnosis not present

## 2023-03-27 DIAGNOSIS — Z125 Encounter for screening for malignant neoplasm of prostate: Secondary | ICD-10-CM | POA: Diagnosis not present

## 2023-03-27 DIAGNOSIS — E538 Deficiency of other specified B group vitamins: Secondary | ICD-10-CM | POA: Diagnosis not present

## 2023-03-27 LAB — CBC WITH DIFFERENTIAL/PLATELET
Basophils Absolute: 0.1 10*3/uL (ref 0.0–0.1)
Basophils Relative: 0.9 % (ref 0.0–3.0)
Eosinophils Absolute: 0.5 10*3/uL (ref 0.0–0.7)
Eosinophils Relative: 7.9 % — ABNORMAL HIGH (ref 0.0–5.0)
HCT: 52.6 % — ABNORMAL HIGH (ref 39.0–52.0)
Hemoglobin: 16.7 g/dL (ref 13.0–17.0)
Lymphocytes Relative: 31.2 % (ref 12.0–46.0)
Lymphs Abs: 2 10*3/uL (ref 0.7–4.0)
MCHC: 31.8 g/dL (ref 30.0–36.0)
MCV: 84.5 fL (ref 78.0–100.0)
Monocytes Absolute: 0.6 10*3/uL (ref 0.1–1.0)
Monocytes Relative: 9.5 % (ref 3.0–12.0)
Neutro Abs: 3.3 10*3/uL (ref 1.4–7.7)
Neutrophils Relative %: 50.5 % (ref 43.0–77.0)
Platelets: 251 10*3/uL (ref 150.0–400.0)
RBC: 6.23 Mil/uL — ABNORMAL HIGH (ref 4.22–5.81)
RDW: 18 % — ABNORMAL HIGH (ref 11.5–15.5)
WBC: 6.4 10*3/uL (ref 4.0–10.5)

## 2023-03-27 LAB — HEPATIC FUNCTION PANEL
ALT: 31 U/L (ref 0–53)
AST: 20 U/L (ref 0–37)
Albumin: 4.7 g/dL (ref 3.5–5.2)
Alkaline Phosphatase: 67 U/L (ref 39–117)
Bilirubin, Direct: 0.1 mg/dL (ref 0.0–0.3)
Total Bilirubin: 0.4 mg/dL (ref 0.2–1.2)
Total Protein: 7.4 g/dL (ref 6.0–8.3)

## 2023-03-27 LAB — VITAMIN B12: Vitamin B-12: 395 pg/mL (ref 211–911)

## 2023-03-27 LAB — BASIC METABOLIC PANEL
BUN: 9 mg/dL (ref 6–23)
CO2: 30 meq/L (ref 19–32)
Calcium: 9.8 mg/dL (ref 8.4–10.5)
Chloride: 100 meq/L (ref 96–112)
Creatinine, Ser: 1.11 mg/dL (ref 0.40–1.50)
GFR: 79.3 mL/min (ref >60.00)
Glucose, Bld: 73 mg/dL (ref 70–99)
Potassium: 4.3 meq/L (ref 3.5–5.1)
Sodium: 140 meq/L (ref 135–145)

## 2023-03-27 LAB — TSH: TSH: 2.09 u[IU]/mL (ref 0.35–5.50)

## 2023-03-27 LAB — HEMOGLOBIN A1C: Hgb A1c MFr Bld: 5.4 % (ref 4.6–6.5)

## 2023-03-27 LAB — PSA: PSA: 0.3 ng/mL (ref 0.10–4.00)

## 2023-03-27 LAB — VITAMIN D 25 HYDROXY (VIT D DEFICIENCY, FRACTURES): VITD: 19.54 ng/mL — ABNORMAL LOW (ref 30.00–100.00)

## 2023-03-27 MED ORDER — CYCLOBENZAPRINE HCL 5 MG PO TABS: 5.0000 mg | ORAL_TABLET | Freq: Three times a day (TID) | ORAL | 2 refills | Status: AC | PRN

## 2023-03-27 NOTE — Progress Notes (Signed)
 Patient ID: Jason Hogan, male   DOB: 1975-10-06, 48 y.o.   MRN: [id number]         Chief Complaint:: wellness exam and hld, htn, hyperglycemia, low vit d, chf       HPI:  Jason Hogan is a 48 y.o. male here for wellness exam; for flu shot and prevnar today, also for cologuard, o/w up to date                        Also had episode of CAP with antibx complicated by c diff 2024.  Pt denies chest pain, increased sob or doe, wheezing, orthopnea, PND, increased LE swelling, palpitations, dizziness or syncope.   Pt denies polydipsia, polyuria, or new focal neuro s/s.    Pt denies fever, wt loss, night sweats, loss of appetite, or other constitutional symptoms   Wt Readings from Last 3 Encounters:  03/27/23 214 lb (97.1 kg)  02/20/23 219 lb 3.2 oz (99.4 kg)  12/26/22 208 lb 3.2 oz (94.4 kg)   BP Readings from Last 3 Encounters:  03/27/23 122/78  03/05/23 (!) 143/92  02/20/23 118/84   Immunization History  Administered Date(s) Administered   Fluzone Influenza virus vaccine,trivalent (IIV3), split virus 11/30/2012   Influenza Inj Mdck Quad Pf 02/08/2022   Influenza, Seasonal, Injecte, Preservative Fre 03/27/2023   Influenza,inj,Quad PF,6+ Mos 10/14/2013, 11/17/2014, 10/24/2015, 12/13/2016, 11/15/2017, 10/26/2019   Influenza-Unspecified 09/18/2012, 12/20/2020, 02/08/2022   Moderna Sars-Covid-2 Vaccination 04/22/2019, 05/24/2019, 12/22/2019   PNEUMOCOCCAL CONJUGATE-20 03/27/2023   Pneumococcal Polysaccharide-23 02/18/2009   Tdap 10/26/2019   Health Maintenance Due  Topic Date Due   Colonoscopy  Never done      Past Medical History:  Diagnosis Date   Acute Myocardial Infarction 08/18/2009   Qualifier: Diagnosis of  By: Gwenn Grimes     Allergic rhinitis, cause unspecified 10/03/2011   Anxiety 10/03/2011   Asthma 09/20/2006   Qualifier: Diagnosis of  By: Norleen MD, Lynwood ORN    Automatic implantable cardiac defibrillator in situ 01/17/2010   With pacing function (11/25/2017).  Qualifier: Diagnosis of  By: Delford, MD, CODY Maude Dunnings    CAD (coronary artery disease)    a. large anterior MI 2011 s/p PTCA/DES to 100%. b. occluded LAD with occluded collateralized silent RCA, balloon angioplasty to stented segment of LAD and DES x1 to mid circumflex in 10/2017.   CHF (congestive heart failure) (HCC)    Cholelithiasis 06/22/2007   Qualifier: Diagnosis of  By: Helga Almarie Caldron    Chronic systolic heart failure (HCC) 09/18/2009   Qualifier: Diagnosis of  By: Gwenn Grimes     Depressive disorder, not elsewhere classified 06/22/2007   Qualifier: Diagnosis of  By: Helga Almarie Caldron    History of gout    History of kidney stones    Hyperlipidemia 81/2011   Hypertension    Impaired glucose tolerance 10/05/2012   Insomnia, unspecified 06/22/2007   Qualifier: Diagnosis of  By: Norleen MD, Lynwood ORN    Ischemic cardiomyopathy 10/20/2009   Qualifier: Diagnosis of  By: Fernande, MD, CODY Elspeth Darner    Low testosterone  10/14/2013   Migraine Headache 09/18/2009   Qualifier: Diagnosis of  By: Gwenn Grimes     Renal calculus    Renal Insufficiency 09/18/2009   Qualifier: Diagnosis of  By: Gwenn Grimes     Respiratory failure (HCC) 08/2009   Rhabdomyolysis 09/18/2009   Qualifier: Diagnosis of  By: Gwenn Grimes     VT (ventricular  tachycardia) [facility])    Past Surgical History:  Procedure Laterality Date   CARDIAC CATHETERIZATION N/A 05/17/2015   Procedure: Left Heart Cath and Coronary Angiography;  Surgeon: Maude JAYSON Emmer, MD;  Location: [facility] INVASIVE CV LAB;  Service: Cardiovascular;  Laterality: N/A;   CARDIAC CATHETERIZATION  2012   CORONARY ANGIOPLASTY WITH STENT PLACEMENT  2011   CORONARY STENT INTERVENTION N/A 11/14/2017   Procedure: CORONARY STENT INTERVENTION;  Surgeon: Verlin Lonni BIRCH, MD;  Location: [facility] INVASIVE CV LAB;  Service: Cardiovascular;  Laterality: N/A;   ICD GENERATOR CHANGEOUT N/A 06/04/2021   Procedure: ICD GENERATOR  CHANGEOUT;  Surgeon: Fernande Elspeth JAYSON, MD;  Location: [facility] INVASIVE CV LAB;  Service: Cardiovascular;  Laterality: N/A;   ICD IMPLANT  11/2009   w/pacing function   RIGHT/LEFT HEART CATH AND CORONARY ANGIOGRAPHY N/A 11/14/2017   Procedure: RIGHT/LEFT HEART CATH AND CORONARY ANGIOGRAPHY;  Surgeon: Verlin Lonni BIRCH, MD;  Location: [facility] INVASIVE CV LAB;  Service: Cardiovascular;  Laterality: N/A;    reports that he quit smoking about 29 years ago. His smoking use included cigarettes. He started smoking about 31 years ago. He has a 0.2 pack-year smoking history. He has never used smokeless tobacco. He reports that he does not currently use alcohol. He reports that he does not use drugs. family history includes Diabetes in an other family member; Heart attack in his father; Heart disease in an other family member; Hypothyroidism in an other family member. Allergies  Allergen Reactions   Levaquin  [Levofloxacin ] Other (See Comments)    Joint pain   Ace Inhibitors Cough   Crestor  [Rosuvastatin ] Other (See Comments)    Sweet cravings   Lunesta [Eszopiclone] Other (See Comments)    Bitter taste in mouth   Zocor [Simvastatin] Other (See Comments)    Memory problem   Current Outpatient Medications on File Prior to Visit  Medication Sig Dispense Refill   albuterol  (VENTOLIN  HFA) 108 (90 Base) MCG/ACT inhaler Inhale 1-2 puffs into the lungs every 6 (six) hours as needed for wheezing or shortness of breath. 18 g 11   allopurinol  (ZYLOPRIM ) 100 MG tablet TAKE 2 TABLETS BY MOUTH DAILY 60 tablet 0   ALPRAZolam  (XANAX ) 0.5 MG tablet TAKE ONE TABLET BY MOUTH DAILY AS NEEDED FOR ANXIETY 30 tablet 5   aspirin  EC 81 MG tablet Take 81 mg by mouth daily.     azelastine (ASTELIN) 0.1 % nasal spray Place 1 spray into both nostrils daily as needed for rhinitis.     budesonide -formoterol  (SYMBICORT ) 80-4.5 MCG/ACT inhaler Inhale 2 puffs into the lungs daily as needed (wheezing).     carvedilol  (COREG ) 25 MG  tablet Take 1 tablet (25 mg total) by mouth 2 (two) times daily. 60 tablet 6   clopidogrel  (PLAVIX ) 75 MG tablet TAKE 1 TABLET BY MOUTH DAILY 60 tablet 11   dapagliflozin  propanediol (FARXIGA ) 10 MG TABS tablet TAKE ONE TABLET BY MOUTH DAILY BEFORE BREAKFAST 90 tablet 3   Dulaglutide (TRULICITY) 1.5 MG/0.5ML SOPN Inject 1.5 mg into the skin once a week. Fridays     ENTRESTO  97-103 MG TAKE ONE TABLET BY MOUTH TWICE A DAY 60 tablet 11   Evolocumab  (REPATHA  SURECLICK) 140 MG/ML SOAJ Inject 140 mg into the skin every 14 (fourteen) days. 2 mL 11   ezetimibe  (ZETIA ) 10 MG tablet TAKE 1 TABLET BY MOUTH DAILY 90 tablet 1   HYDROcodone -acetaminophen  (NORCO/VICODIN) 5-325 MG tablet Take 1 tablet by mouth every 4 (four) hours as needed. 10 tablet 0  metolazone  (ZAROXOLYN ) 5 MG tablet TAKE 1 TABLET (5 MG TOTAL) BY MOUTH DAILY AS NEEDED (FOR EDEMA). 30 tablet 9   mexiletine (MEXITIL ) 200 MG capsule TAKE 1 CAPSULE BY MOUTH 2 TIMES A DAY 180 capsule 1   potassium chloride  SA (KLOR-CON  M) 20 MEQ tablet Take 1 tablet (20 mEq total) by mouth daily as needed (when taking torsemide ). 30 tablet 6   sertraline  (ZOLOFT ) 100 MG tablet Take 1 tablet (100 mg total) by mouth daily. 90 tablet 2   spironolactone  (ALDACTONE ) 25 MG tablet TAKE 1 TABLET BY MOUTH DAILY 90 tablet 1   tiZANidine  (ZANAFLEX ) 4 MG tablet TAKE ONE TABLET BY MOUTH TWICE A DAY AS NEEDED Annual appt due in July must see provider for future refills 60 tablet 0   torsemide  (DEMADEX ) 20 MG tablet Take 1 tablet (20 mg total) by mouth daily as needed. 30 tablet 6   VASCEPA  1 g capsule Take 2 capsules (2 g total) by mouth 2 (two) times daily. 120 capsule 6   No current facility-administered medications on file prior to visit.        ROS:  All others reviewed and negative.  Objective        PE:  BP 122/78 (BP Location: Left Arm, Patient Position: Sitting, Cuff Size: Normal)   Pulse 82   Temp 98.5 F (36.9 C) (Oral)   Ht 6' 2 (1.88 m)   Wt 214 lb (97.1  kg)   SpO2 97%   BMI 27.48 kg/m                 Constitutional: Pt appears in NAD               HENT: Head: NCAT.                Right Ear: External ear normal.                 Left Ear: External ear normal.                Eyes: . Pupils are equal, round, and reactive to light. Conjunctivae and EOM are normal               Nose: without d/c or deformity               Neck: Neck supple. Gross normal ROM               Cardiovascular: Normal rate and regular rhythm.                 Pulmonary/Chest: Effort normal and breath sounds without rales or wheezing.                Abd:  Soft, NT, ND, + BS, no organomegaly               Neurological: Pt is alert. At baseline orientation, motor grossly intact               Skin: Skin is warm. No rashes, no other new lesions, LE edema - none               Psychiatric: Pt behavior is normal without agitation   Micro: none  Cardiac tracings I have personally interpreted today:  none  Pertinent Radiological findings (summarize): none   Lab Results  Component Value Date   WBC 6.4 03/27/2023   HGB 16.7 03/27/2023   HCT 52.6 (H) 03/27/2023   PLT 251.0 03/27/2023  GLUCOSE 73 03/27/2023   CHOL 256 (H) 02/20/2023   TRIG 189 (H) 02/20/2023   HDL 47 02/20/2023   LDLDIRECT 54.9 08/13/2021   LDLCALC 171 (H) 02/20/2023   ALT 31 03/27/2023   AST 20 03/27/2023   NA 140 03/27/2023   K 4.3 03/27/2023   CL 100 03/27/2023   CREATININE 1.11 03/27/2023   BUN 9 03/27/2023   CO2 30 03/27/2023   TSH 2.09 03/27/2023   PSA 0.30 03/27/2023   INR 1.1 (H) 08/01/2016   HGBA1C 5.4 03/27/2023   Assessment/Plan:  DEVANSH RIESE is a 48 y.o. Other or two or more races [6] male with  has a past medical history of Acute Myocardial Infarction (08/18/2009), Allergic rhinitis, cause unspecified (10/03/2011), Anxiety (10/03/2011), Asthma (09/20/2006), Automatic implantable cardiac defibrillator in situ (01/17/2010), CAD (coronary artery disease), CHF (congestive heart  failure) (HCC), Cholelithiasis (06/22/2007), Chronic systolic heart failure (HCC) (02/25/7986), Depressive disorder, not elsewhere classified (06/22/2007), History of gout, History of kidney stones, Hyperlipidemia (81/2011), Hypertension, Impaired glucose tolerance (10/05/2012), Insomnia, unspecified (06/22/2007), Ischemic cardiomyopathy (10/20/2009), Low testosterone  (10/14/2013), Migraine Headache (09/18/2009), Renal calculus, Renal Insufficiency (09/18/2009), Respiratory failure (HCC) (08/2009), Rhabdomyolysis (09/18/2009), and VT (ventricular tachycardia) (HCC).  Encounter for well adult exam with abnormal findings Age and sex appropriate education and counseling updated with regular exercise and diet Referrals for preventative services - for cologuard Immunizations addressed - for flu shot, prevnar 20 Smoking counseling  - none needed Evidence for depression or other mood disorder - none significant Most recent labs reviewed. I have personally reviewed and have noted: 1) the patient's medical and social history 2) The patient's current medications and supplements 3) The patient's height, weight, and BMI have been recorded in the chart   Chronic systolic heart failure (HCC) Volume stable,  to f/u any worsening symptoms or concerns   Hyperlipidemia Lab Results  Component Value Date   LDLCALC 171 (H) 02/20/2023   Severe uncontrolled, pt to restart repatha  140 mg    Hypertension BP Readings from Last 3 Encounters:  03/27/23 122/78  03/05/23 (!) 143/92  02/20/23 118/84   Stable, pt to continue medical treatment coreg  25 bid,    Impaired glucose tolerance Lab Results  Component Value Date   HGBA1C 5.4 03/27/2023   Stable, pt to continue current medical treatment farxiga  10 every day trulicity 1.5 mg weekly   Vitamin D  deficiency Last vitamin D  Lab Results  Component Value Date   VD25OH 19.54 (L) 03/27/2023   Low, to start oral replacement  Followup: Return in about 1 year  (around 03/26/2024).  Lynwood Rush, MD 03/29/2023 5:21 PM [facility]  Primary Care - [facility]

## 2023-03-27 NOTE — Patient Instructions (Addendum)
 You had the flu shot today, and the Prevnar 20 pneumonia shot  Please continue all other medications as before, and refills have been done if requested- flexeril .  Please have the pharmacy call with any other refills you may need.  Please continue your efforts at being more active, low cholesterol diet, and weight control.  You are otherwise up to date with prevention measures today.  Please keep your appointments with your specialists as you may have planned  You will be contacted regarding the referral for: cologuard  Please go to the LAB at the blood drawing area for the tests to be done  You will be contacted by phone if any changes need to be made immediately.  Otherwise, you will receive a letter about your results with an explanation, but please check with MyChart first.  Please make an Appointment to return for your 1 year visit, or sooner if needed

## 2023-03-28 ENCOUNTER — Encounter: Payer: Self-pay | Admitting: Internal Medicine

## 2023-03-28 LAB — URINALYSIS, ROUTINE W REFLEX MICROSCOPIC
Bilirubin Urine: NEGATIVE
Hgb urine dipstick: NEGATIVE
Ketones, ur: NEGATIVE
Leukocytes,Ua: NEGATIVE
Nitrite: NEGATIVE
Specific Gravity, Urine: 1.02 (ref 1.000–1.030)
Total Protein, Urine: NEGATIVE
Urine Glucose: 500 — AB
Urobilinogen, UA: 0.2 (ref 0.0–1.0)
pH: 5.5 (ref 5.0–8.0)

## 2023-03-28 NOTE — Progress Notes (Signed)
 The test results show that your current treatment is OK, as the tests are stable.  Please continue the same plan.  There is no other need for change of treatment or further evaluation based on these results, at this time.  thanks

## 2023-03-29 ENCOUNTER — Encounter: Payer: Self-pay | Admitting: Internal Medicine

## 2023-03-29 NOTE — Assessment & Plan Note (Signed)
 Lab Results  Component Value Date   LDLCALC 171 (H) 02/20/2023   Severe uncontrolled, pt to restart repatha  140 mg

## 2023-03-29 NOTE — Assessment & Plan Note (Signed)
 Lab Results  Component Value Date   HGBA1C 5.4 03/27/2023   Stable, pt to continue current medical treatment farxiga  10 every day trulicity 1.5 mg weekly

## 2023-03-29 NOTE — Assessment & Plan Note (Signed)
 Age and sex appropriate education and counseling updated with regular exercise and diet Referrals for preventative services - for cologuard Immunizations addressed - for flu shot, prevnar 20 Smoking counseling  - none needed Evidence for depression or other mood disorder - none significant Most recent labs reviewed. I have personally reviewed and have noted: 1) the patient's medical and social history 2) The patient's current medications and supplements 3) The patient's height, weight, and BMI have been recorded in the chart

## 2023-03-29 NOTE — Assessment & Plan Note (Signed)
Volume stable,  to f/u any worsening symptoms or concerns °

## 2023-03-29 NOTE — Assessment & Plan Note (Signed)
 BP Readings from Last 3 Encounters:  03/27/23 122/78  03/05/23 (!) 143/92  02/20/23 118/84   Stable, pt to continue medical treatment coreg  25 bid,

## 2023-03-29 NOTE — Assessment & Plan Note (Signed)
 Last vitamin D  Lab Results  Component Value Date   VD25OH 19.54 (L) 03/27/2023   Low, to start oral replacement

## 2023-03-31 ENCOUNTER — Ambulatory Visit: Payer: BC Managed Care – PPO | Admitting: Student

## 2023-04-08 ENCOUNTER — Other Ambulatory Visit: Payer: Self-pay

## 2023-04-08 ENCOUNTER — Other Ambulatory Visit (HOSPITAL_COMMUNITY): Payer: Self-pay | Admitting: Cardiology

## 2023-04-08 ENCOUNTER — Other Ambulatory Visit: Payer: Self-pay | Admitting: Internal Medicine

## 2023-04-08 DIAGNOSIS — E785 Hyperlipidemia, unspecified: Secondary | ICD-10-CM

## 2023-04-08 DIAGNOSIS — I2510 Atherosclerotic heart disease of native coronary artery without angina pectoris: Secondary | ICD-10-CM

## 2023-04-08 NOTE — Progress Notes (Unsigned)
  Electrophysiology Office Note:   ID:  Jason Hogan, DOB [birth year], MRN [id number]  Primary Cardiologist: Charlton Haws, MD Electrophysiologist: Sherryl Manges, MD  {Click to update primary MD,subspecialty MD or APP then REFRESH:1}    History of Present Illness:   Jason Hogan is a 48 y.o. male with h/o ICM, PVCs, NSVT, and chronic systolic CHF seen today for routine electrophysiology followup.   Since last being seen in our clinic the patient reports doing ***.  he denies chest pain, palpitations, dyspnea, PND, orthopnea, nausea, vomiting, dizziness, syncope, edema, weight gain, or early satiety.   Review of systems complete and found to be negative unless listed in HPI.   EP Information / Studies Reviewed:    EKG is not ordered today. EKG from 03/05/2023 reviewed which showed sinus tachycardia 103 bpm       ICD Interrogation-  reviewed in detail today,  See PACEART report.  Arrhythmia/Device History Abbott Single Chamber ICD implanted 2011, gen change 06/04/2021 for CHF   Physical Exam:   VS:  There were no vitals taken for this visit.   Wt Readings from Last 3 Encounters:  03/27/23 214 lb (97.1 kg)  02/20/23 219 lb 3.2 oz (99.4 kg)  12/26/22 208 lb 3.2 oz (94.4 kg)     GEN: No acute distress *** NECK: No JVD; No carotid bruits CARDIAC: {EPRHYTHM:28826}, no murmurs, rubs, gallops RESPIRATORY:  Clear to auscultation without rales, wheezing or rhonchi  ABDOMEN: Soft, non-tender, non-distended EXTREMITIES:  {EDEMA LEVEL:28147::"No"} edema; No deformity   ASSESSMENT AND PLAN:    Chronic systolic CHF  s/p Abbott single chamber ICD  euvolemic today Stable on an appropriate medical regimen Normal ICD function See Pace Art report No changes today  NSVT Continue coreg 25 mg BID Continue mexitil 200 mg BID ***  Disposition:   Follow up with {EPPROVIDERS:28135} {EPFOLLOW UP:28173}   Signed, Graciella Freer, PA-C

## 2023-04-09 ENCOUNTER — Encounter: Payer: Self-pay | Admitting: Student

## 2023-04-09 ENCOUNTER — Ambulatory Visit: Payer: BC Managed Care – PPO | Attending: Student | Admitting: Student

## 2023-04-09 VITALS — BP 114/60 | HR 86 | Ht 74.0 in | Wt 220.0 lb

## 2023-04-09 DIAGNOSIS — Z95810 Presence of automatic (implantable) cardiac defibrillator: Secondary | ICD-10-CM | POA: Diagnosis not present

## 2023-04-09 DIAGNOSIS — I5022 Chronic systolic (congestive) heart failure: Secondary | ICD-10-CM

## 2023-04-09 DIAGNOSIS — I4729 Other ventricular tachycardia: Secondary | ICD-10-CM | POA: Diagnosis not present

## 2023-04-09 LAB — CUP PACEART INCLINIC DEVICE CHECK
Battery Remaining Longevity: 86 mo
Brady Statistic RV Percent Paced: 0 %
Date Time Interrogation Session: 20250219101151
HighPow Impedance: 52.5049
Implantable Lead Connection Status: 753985
Implantable Lead Implant Date: 20111010
Implantable Lead Location: 753860
Implantable Lead Model: 7121
Implantable Pulse Generator Implant Date: 20230417
Lead Channel Impedance Value: 1112.5 Ohm
Lead Channel Pacing Threshold Amplitude: 2.5 V
Lead Channel Pacing Threshold Amplitude: 2.5 V
Lead Channel Pacing Threshold Pulse Width: 1 ms
Lead Channel Pacing Threshold Pulse Width: 1 ms
Lead Channel Sensing Intrinsic Amplitude: 11.5 mV
Lead Channel Setting Pacing Amplitude: 2.75 V
Lead Channel Setting Pacing Pulse Width: 1 ms
Lead Channel Setting Sensing Sensitivity: 0.5 mV
Pulse Gen Serial Number: 8926353
Zone Setting Status: 755011

## 2023-04-09 NOTE — Patient Instructions (Signed)
 Medication Instructions:  Your physician recommends that you continue on your current medications as directed. Please refer to the Current Medication list given to you today.  *If you need a refill on your cardiac medications before your next appointment, please call your pharmacy*  Lab Work: None ordered If you have labs (blood work) drawn today and your tests are completely normal, you will receive your results only by: MyChart Message (if you have MyChart) OR A paper copy in the mail If you have any lab test that is abnormal or we need to change your treatment, we will call you to review the results.  Follow-Up: At [facility], you and your health needs are our priority.  As part of our continuing mission to provide you with exceptional heart care, we have created designated Provider Care Teams.  These Care Teams include your primary Cardiologist (physician) and Advanced Practice Providers (APPs -  Physician Assistants and Nurse Practitioners) who all work together to provide you with the care you need, when you need it.  Your next appointment:   6 month(s)  Provider:   Casimiro Needle "Otilio Saber, PA-C

## 2023-04-13 ENCOUNTER — Other Ambulatory Visit: Payer: Self-pay | Admitting: Internal Medicine

## 2023-04-15 ENCOUNTER — Telehealth: Payer: Self-pay | Admitting: Pharmacy Technician

## 2023-04-15 ENCOUNTER — Other Ambulatory Visit (HOSPITAL_COMMUNITY): Payer: Self-pay

## 2023-04-15 NOTE — Telephone Encounter (Signed)
 Pharmacy Patient Advocate Encounter   Received notification from Fax from [facility] that prior authorization for repatha is required/requested.   Insurance verification completed.   The patient is insured through [facility] .   Per test claim: PA required; PA submitted to above mentioned insurance via CoverMyMeds Key/confirmation #/EOC [employer] Status is pending   Dr Shirlee Latch wrote rx but they do not do pa's -saw dr Lanna Poche last

## 2023-04-15 NOTE — Telephone Encounter (Signed)
 Pharmacy Patient Advocate Encounter  Received notification from [facility] that Prior Authorization for REPATHA has been APPROVED from 04/15/23 to 04/14/24. Spoke to pharmacy to process.Copay is $40.00.    PA #/Case ID/Reference #: [id number]

## 2023-04-21 ENCOUNTER — Telehealth: Payer: Self-pay

## 2023-04-21 DIAGNOSIS — I4729 Other ventricular tachycardia: Secondary | ICD-10-CM

## 2023-04-21 NOTE — Telephone Encounter (Signed)
 Alert received from [employer] for  VT/VF. There are 32 NSVT, all from 3/1 except 1 4 VT-1 events also 3/1 11:53-13:03, longest duration 4sec, HR's 160's-170's, no therapy. Route to triage for increased ectopy.  Had recent OV with Mardelle Matte T., PA, known NSVT (12 beats was the longest).   LVMTCB.

## 2023-04-22 DIAGNOSIS — Z1211 Encounter for screening for malignant neoplasm of colon: Secondary | ICD-10-CM | POA: Diagnosis not present

## 2023-04-22 NOTE — Telephone Encounter (Signed)
 Message received from Pt via MyChart:  I started in the morning by doing yard work. Re-defining flower beds, weed trimming, mowing then blowing all leaves and debris. After 1-2pm I was working on a vehicle for a friend of mine helping him install parts. I felt light headed. I thought I was just dehydrated. I kept drinking water but felt like I had a hard time breathing. It felt like when I was holding water. Bad headaches.   Advised via mychart would forward to [facility] and Dr. Graciela Husbands for review/advisement.

## 2023-04-22 NOTE — Telephone Encounter (Signed)
 Left message requesting call back.  Will also send MyChart message.

## 2023-04-25 NOTE — Telephone Encounter (Signed)
 Spoke with patient regarding provider recommendations. Aware to continue current meds. Pt agrees to have BMET and Mg drawn at [facility]. Orders placed. Pt denies additional questions or concerns at this time.  Will await any additional recommendations from Dr. Graciela Husbands.

## 2023-04-25 NOTE — Progress Notes (Signed)
 Remote ICD transmission.

## 2023-04-29 LAB — COLOGUARD: COLOGUARD: NEGATIVE

## 2023-05-09 NOTE — Telephone Encounter (Signed)
 Attempted phone call to pt.  Voicemail is full.  Unable to leave voicemail message.

## 2023-05-10 ENCOUNTER — Other Ambulatory Visit: Payer: Self-pay | Admitting: Internal Medicine

## 2023-05-12 ENCOUNTER — Other Ambulatory Visit: Payer: Self-pay

## 2023-05-13 NOTE — Telephone Encounter (Signed)
 Attempted phone call to pt #2....  Voicemail is full.  Unable to leave voicemail message.

## 2023-05-15 DIAGNOSIS — R040 Epistaxis: Secondary | ICD-10-CM | POA: Diagnosis not present

## 2023-05-15 NOTE — Telephone Encounter (Signed)
 Jason Freer, PA-C  P Cv Div Ch St Triage Cc: Jason Salvia, MD Caller: Unspecified (3 weeks ago) We have him ordered for a repeat BMET and Mg but doesn't look like he has come through yet. (The old result was from January)  Triage, can we please see if he can come through later today (if possible) or on Monday to update his labs?  Called and spoke with patient. He states he will come into [facility] tomorrow ( Friday 3/27) to have labs drawn. Orders were already in, but not released. Released at this time.

## 2023-05-19 ENCOUNTER — Ambulatory Visit
Admission: RE | Admit: 2023-05-19 | Discharge: 2023-05-19 | Disposition: A | Discharge status: Home / Self Care | Source: Ambulatory Visit | Attending: Family Medicine | Admitting: Family Medicine

## 2023-05-19 ENCOUNTER — Other Ambulatory Visit: Payer: Self-pay

## 2023-05-19 VITALS — BP 121/80 | HR 79 | Temp 98.0°F | Resp 18

## 2023-05-19 DIAGNOSIS — R040 Epistaxis: Secondary | ICD-10-CM | POA: Diagnosis not present

## 2023-05-19 NOTE — Discharge Instructions (Signed)
 You were seen today for recurrent nose bleeds.  I have placed a referral for you to see Jovita Kussmaul, MD at [facility].  [address].  Phone number [phone].  Please call them for an appointment.

## 2023-05-19 NOTE — ED Provider Notes (Signed)
 [facility]    CSN: [id number] Arrival date & time: 05/19/23  1028      History   Chief Complaint Chief Complaint  Patient presents with   Epistaxis    Nose bleed that needs to be cauterized - Entered by patient    HPI Jason Hogan is a 48 y.o. male.    Epistaxis  Patient is here for recurrent nose bleed.  He has been having intermittent nose bleeds from the left nostril since 3/19 after wrestling with his son.  He did go to another local UC, and had it cauterized and used a Rhino rocket.  He continues with intermittent bleeding.  He did call an ENT, but was told he needed a referral, which is why he is here today.        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 Qualifier: Diagnosis of  By: Eden Emms, MD, Harrington Challenger    CAD (coronary artery disease)    a. large anterior MI 2011 s/p PTCA/DES to 100%. b. occluded LAD with occluded collateralized silent RCA, balloon angioplasty to stented segment of LAD and DES x1 to mid circumflex in 10/2017.   CHF (congestive heart failure) (HCC)    Cholelithiasis 06/22/2007   Qualifier: Diagnosis of  By: Maris Berger    Chronic systolic heart failure (HCC) 09/18/2009   Qualifier: Diagnosis of  By: Kem Parkinson     Depressive disorder, not elsewhere classified 06/22/2007   Qualifier: Diagnosis of  By: Maris Berger    History of gout    History of kidney stones    Hyperlipidemia 81/2011   Hypertension    Impaired glucose tolerance 10/05/2012   Insomnia, unspecified 06/22/2007   Qualifier: Diagnosis of  By: Jonny Ruiz MD, Len Blalock    Ischemic cardiomyopathy 10/20/2009    Qualifier: Diagnosis of  By: Graciela Husbands, MD, Susie Cassette    Low testosterone 10/14/2013   Migraine Headache 09/18/2009   Qualifier: Diagnosis of  By: Kem Parkinson     Renal calculus    Renal Insufficiency 09/18/2009   Qualifier: Diagnosis of  By: Kem Parkinson     Respiratory failure (HCC) 08/2009   Rhabdomyolysis 09/18/2009   Qualifier: Diagnosis of  By: Kem Parkinson     VT (ventricular tachycardia) [facility])     Patient Active Problem List   Diagnosis Date Noted   ARF (acute renal failure) (HCC) 03/05/2021   Intractable diarrhea 03/04/2021   Vitamin D deficiency 05/02/2020   Polycythemia 05/02/2020   Nonallopathic lesion of sacral region 01/26/2020   Hypogonadism in male 04/21/2019   Allergy 04/21/2019   Plantar fasciitis of left foot 02/25/2019   Asthma exacerbation 02/11/2019   Statin myopathy 11/05/2018   Left wrist fracture, closed, initial encounter 01/01/2018   Ischemic cardiomyopathy s/p AICD 11/25/2017   Hypertension 11/25/2017   CAD (coronary artery disease), native coronary artery 11/15/2017   Unstable angina (HCC)    Labral tear of shoulder, right, initial encounter 03/04/2017   Acute bursitis of right shoulder 02/04/2017   Hyperestrogenism 10/04/2016   Fatigue 08/16/2016   Hematuria 07/17/2016   Chest pain 07/04/2016   Dyspnea 07/04/2016  Low back pain 07/04/2016   Cervical radiculopathy at C8 03/08/2015   Polyarthralgia 11/17/2014   Pes anserine bursitis 11/17/2014   Epistaxis, recurrent 08/18/2014   Allergic rhinitis 08/18/2014   Shoulder bursitis 08/01/2014   Tachycardia 04/19/2014   Gout 12/06/2013   Insomnia 10/14/2013   Low testosterone 10/14/2013   Impaired glucose tolerance 10/05/2012   Reduced libido 01/07/2012   Anxiety 10/03/2011   Encounter for well adult exam with abnormal findings 10/02/2011   Implantable cardioverter-defibrillator (ICD) in situ 01/17/2010   Migraine headache 09/18/2009   Acute myocardial infarction (HCC)  09/18/2009   Chronic systolic heart failure (HCC) 09/18/2009   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/18/2009   Rhabdomyolysis 09/18/2009   Hyperlipidemia 06/22/2007   Depression 06/22/2007   Sleep disorder 06/22/2007   Asthma 09/20/2006   RENAL CALCULUS, HX OF 09/20/2006    Past Surgical History:  Procedure Laterality Date   CARDIAC CATHETERIZATION N/A 05/17/2015   Procedure: Left Heart Cath and Coronary Angiography;  Surgeon: Wendall Stade, MD;  Location: [facility] INVASIVE CV LAB;  Service: Cardiovascular;  Laterality: N/A;   CARDIAC CATHETERIZATION  2012   CORONARY ANGIOPLASTY WITH STENT PLACEMENT  2011   CORONARY STENT INTERVENTION N/A 11/14/2017   Procedure: CORONARY STENT INTERVENTION;  Surgeon: Kathleene Hazel, MD;  Location: [facility] INVASIVE CV LAB;  Service: Cardiovascular;  Laterality: N/A;   ICD GENERATOR CHANGEOUT N/A 06/04/2021   Procedure: ICD GENERATOR CHANGEOUT;  Surgeon: Duke Salvia, MD;  Location: [facility] INVASIVE CV LAB;  Service: Cardiovascular;  Laterality: N/A;   ICD IMPLANT  11/2009   "w/pacing function"   RIGHT/LEFT HEART CATH AND CORONARY ANGIOGRAPHY N/A 11/14/2017   Procedure: RIGHT/LEFT HEART CATH AND CORONARY ANGIOGRAPHY;  Surgeon: Kathleene Hazel, MD;  Location: [facility] INVASIVE CV LAB;  Service: Cardiovascular;  Laterality: N/A;       Home Medications    Prior to Admission medications   Medication Sig Start Date End Date Taking? Authorizing Provider  albuterol (VENTOLIN HFA) 108 (90 Base) MCG/ACT inhaler Inhale 1-2 puffs into the lungs every 6 (six) hours as needed for wheezing or shortness of breath. 02/24/20   Corwin Levins, MD  allopurinol (ZYLOPRIM) 100 MG tablet TAKE 2 TABLETS BY MOUTH DAILY 05/12/23   Corwin Levins, MD  ALPRAZolam Prudy Feeler) 0.5 MG tablet TAKE ONE TABLET BY MOUTH DAILY AS NEEDED FOR ANXIETY 02/13/21   Corwin Levins, MD  aspirin EC 81 MG tablet Take 81 mg by mouth daily.    [provider]  azelastine (ASTELIN) 0.1 %  nasal spray Place 1 spray into both nostrils daily as needed for rhinitis. 08/14/19   [provider]  budesonide-formoterol (SYMBICORT) 80-4.5 MCG/ACT inhaler Inhale 2 puffs into the lungs daily as needed (wheezing). 07/01/17   [provider]  carvedilol (COREG) 25 MG tablet Take 1 tablet (25 mg total) by mouth 2 (two) times daily. 12/05/22   Laurey Morale, MD  clopidogrel (PLAVIX) 75 MG tablet TAKE 1 TABLET BY MOUTH DAILY 10/07/22   Laurey Morale, MD  cyclobenzaprine (FLEXERIL) 5 MG tablet Take 1 tablet (5 mg total) by mouth 3 (three) times daily as needed. 03/27/23   Corwin Levins, MD  dapagliflozin propanediol (FARXIGA) 10 MG TABS tablet TAKE ONE TABLET BY MOUTH DAILY BEFORE BREAKFAST 08/07/22   Laurey Morale, MD  Dulaglutide (TRULICITY) 1.5 MG/0.5ML SOPN Inject 1.5 mg into the skin once a week. Fridays    [provider]  ENTRESTO 97-103 MG TAKE  ONE TABLET BY MOUTH TWICE A DAY 05/15/22   Laurey Morale, MD  ezetimibe (ZETIA) 10 MG tablet TAKE 1 TABLET BY MOUTH DAILY 03/19/23   Jacklynn Ganong, FNP  HYDROcodone-acetaminophen (NORCO/VICODIN) 5-325 MG tablet Take 1 tablet by mouth every 4 (four) hours as needed. 03/05/23   Jacalyn Lefevre, MD  metolazone (ZAROXOLYN) 5 MG tablet TAKE 1 TABLET (5 MG TOTAL) BY MOUTH DAILY AS NEEDED (FOR EDEMA). 10/08/16   Wendall Stade, MD  mexiletine (MEXITIL) 200 MG capsule TAKE 1 CAPSULE BY MOUTH 2 TIMES A DAY 11/06/22   Laurey Morale, MD  potassium chloride SA (KLOR-CON M) 20 MEQ tablet Take 1 tablet (20 mEq total) by mouth daily as needed (when taking torsemide). 02/20/23   Jacklynn Ganong, FNP  REPATHA SURECLICK 140 MG/ML SOAJ INJECT 140MG  INTO THE SKIN EVERY 14 DAYS 04/09/23   Laurey Morale, MD  sertraline (ZOLOFT) 100 MG tablet TAKE 1 TABLET BY MOUTH DAILY 04/08/23   Corwin Levins, MD  spironolactone (ALDACTONE) 25 MG tablet TAKE 1 TABLET BY MOUTH DAILY 12/13/22   Laurey Morale, MD  tiZANidine (ZANAFLEX) 4 MG tablet  TAKE 1 TABLET BY MOUTH 2 TIMES A DAY AS NEEDED 04/08/23   Corwin Levins, MD  torsemide (DEMADEX) 20 MG tablet Take 1 tablet (20 mg total) by mouth daily as needed. 02/20/23   Milford, Anderson Malta, FNP  VASCEPA 1 g capsule Take 2 capsules (2 g total) by mouth 2 (two) times daily. 08/19/22   Laurey Morale, MD    Family History Family History  Problem Relation Age of Onset   Heart attack Father    Heart disease Other    Diabetes Other    Hypothyroidism Other     Social History Social History   Tobacco Use   Smoking status: Former    Current packs/day: 0.00    Average packs/day: 0.1 packs/day for 2.0 years (0.2 ttl pk-yrs)    Types: Cigarettes    Start date: 02/19/1992    Quit date: 02/18/1994    Years since quitting: 29.2   Smokeless tobacco: Never  Vaping Use   Vaping status: Never Used  Substance Use Topics   Alcohol use: Not Currently    Comment: 11/25/2017 "couple beers/month; if that"   Drug use: Never     Allergies   Levaquin [levofloxacin], Ace inhibitors, Crestor [rosuvastatin], Lunesta [eszopiclone], and Zocor [simvastatin]   Review of Systems Review of Systems  Constitutional: Negative.   HENT:  Positive for nosebleeds.   Respiratory: Negative.    Cardiovascular: Negative.   Gastrointestinal: Negative.   Genitourinary: Negative.   Musculoskeletal: Negative.   Psychiatric/Behavioral: Negative.       Physical Exam Triage Vital Signs ED Triage Vitals  Encounter Vitals Group     BP 05/19/23 1045 121/80     Systolic BP Percentile --      Diastolic BP Percentile --      Pulse Rate 05/19/23 1045 79     Resp 05/19/23 1045 18     Temp 05/19/23 1045 98 F (36.7 C)     Temp Source 05/19/23 1045 Oral     SpO2 05/19/23 1045 95 %     Weight --      Height --      Head Circumference --      Peak Flow --      Pain Score 05/19/23 1043 0     Pain Loc --  Pain Education --      Exclude from Growth Chart --    No data found.  Updated Vital Signs BP 121/80  (BP Location: Left Arm)   Pulse 79   Temp 98 F (36.7 C) (Oral)   Resp 18   SpO2 95%   Visual Acuity Right Eye Distance:   Left Eye Distance:   Bilateral Distance:    Right Eye Near:   Left Eye Near:    Bilateral Near:     Physical Exam Constitutional:      Appearance: Normal appearance. He is normal weight.  HENT:     Nose:     Comments: Patient has a tissue lodged in the left nostril Skin:    General: Skin is warm.  Neurological:     General: No focal deficit present.     Mental Status: He is alert.  Psychiatric:        Mood and Affect: Mood normal.      UC Treatments / Results  Labs (all labs ordered are listed, but only abnormal results are displayed) Labs Reviewed - No data to display  EKG   Radiology No results found.  Procedures Procedures (including critical care time)  Medications Ordered in UC Medications - No data to display  Initial Impression / Assessment and Plan / UC Course  I have reviewed the triage vital signs and the nursing notes.  Pertinent labs & imaging results that were available during my care of the patient were reviewed by me and considered in my medical decision making (see chart for details).  Final Clinical Impressions(s) / UC Diagnoses   Final diagnoses:  Epistaxis     Discharge Instructions      You were seen today for recurrent nose bleeds.  I have placed a referral for you to see Jovita Kussmaul, MD at [facility].  [address].  Phone number [phone].  Please call them for an appointment.     ED Prescriptions   None    PDMP not reviewed this encounter.   Jannifer Franklin, MD 05/19/23 1100

## 2023-05-19 NOTE — ED Triage Notes (Signed)
 Pt is here with an on & off nose bleed that started last 05/07/2023 after him and his son were wrestling and was kneed in the nose. Pt states he went to an urgent care last Thursday and they were unable to cauterize it and used a Rhino rocket. Pt has not taken any meds to relieve discomfort.

## 2023-05-21 NOTE — Telephone Encounter (Signed)
 Pt has not returned for labs as of today, 05/21/23.  Attempted phone call to pt.  Unable to leave message as voicemail is full.

## 2023-05-25 ENCOUNTER — Emergency Department (HOSPITAL_BASED_OUTPATIENT_CLINIC_OR_DEPARTMENT_OTHER)
Admission: EM | Admit: 2023-05-25 | Discharge: 2023-05-25 | Disposition: A | Discharge status: Home / Self Care | Attending: Emergency Medicine | Admitting: Emergency Medicine

## 2023-05-25 DIAGNOSIS — Z7902 Long term (current) use of antithrombotics/antiplatelets: Secondary | ICD-10-CM | POA: Insufficient documentation

## 2023-05-25 DIAGNOSIS — R040 Epistaxis: Secondary | ICD-10-CM | POA: Diagnosis not present

## 2023-05-25 DIAGNOSIS — Z7982 Long term (current) use of aspirin: Secondary | ICD-10-CM | POA: Diagnosis not present

## 2023-05-25 DIAGNOSIS — I2510 Atherosclerotic heart disease of native coronary artery without angina pectoris: Secondary | ICD-10-CM | POA: Diagnosis not present

## 2023-05-25 NOTE — ED Provider Notes (Signed)
 [facility] Provider Note   CSN: [id number] Arrival date & time: 05/25/23  4696     History  Chief Complaint  Patient presents with   Epistaxis    Jason Hogan is a 48 y.o. male.  48 year old male presents today for concern of epistaxis from his left nare.  He states he was wrestling with his son mid March when he was kneed in the nose causing an epistaxis.  He states this has been intermittent since then.  He states on March 27 he went to urgent care and had a [employer] placed.  He states he then proceeded to go to ENT but they requested he needed to go to a [facility] facility to get the referral.  He states he returned to the [facility] urgent care and got a referral but states he is not able to see ENT until June.  He states he was called back and this was scheduled for earlier in June however still no sooner appointment.  He states since then this has been intermittent.  In the past 12 hours he has had 2 episodes.  Denies other complaints.  The history is provided by the patient. No language interpreter was used.       Home Medications Prior to Admission medications   Medication Sig Start Date End Date Taking? Authorizing Provider  albuterol (VENTOLIN HFA) 108 (90 Base) MCG/ACT inhaler Inhale 1-2 puffs into the lungs every 6 (six) hours as needed for wheezing or shortness of breath. 02/24/20   Corwin Levins, MD  allopurinol (ZYLOPRIM) 100 MG tablet TAKE 2 TABLETS BY MOUTH DAILY 05/12/23   Corwin Levins, MD  ALPRAZolam Prudy Feeler) 0.5 MG tablet TAKE ONE TABLET BY MOUTH DAILY AS NEEDED FOR ANXIETY 02/13/21   Corwin Levins, MD  aspirin EC 81 MG tablet Take 81 mg by mouth daily.    [provider]  azelastine (ASTELIN) 0.1 % nasal spray Place 1 spray into both nostrils daily as needed for rhinitis. 08/14/19   [provider]  budesonide-formoterol (SYMBICORT) 80-4.5 MCG/ACT inhaler Inhale 2 puffs into the lungs daily as needed  (wheezing). 07/01/17   [provider]  carvedilol (COREG) 25 MG tablet Take 1 tablet (25 mg total) by mouth 2 (two) times daily. 12/05/22   Laurey Morale, MD  clopidogrel (PLAVIX) 75 MG tablet TAKE 1 TABLET BY MOUTH DAILY 10/07/22   Laurey Morale, MD  cyclobenzaprine (FLEXERIL) 5 MG tablet Take 1 tablet (5 mg total) by mouth 3 (three) times daily as needed. 03/27/23   Corwin Levins, MD  dapagliflozin propanediol (FARXIGA) 10 MG TABS tablet TAKE ONE TABLET BY MOUTH DAILY BEFORE BREAKFAST 08/07/22   Laurey Morale, MD  Dulaglutide (TRULICITY) 1.5 MG/0.5ML SOPN Inject 1.5 mg into the skin once a week. Fridays    [provider]  ENTRESTO 97-103 MG TAKE ONE TABLET BY MOUTH TWICE A DAY 05/15/22   Laurey Morale, MD  ezetimibe (ZETIA) 10 MG tablet TAKE 1 TABLET BY MOUTH DAILY 03/19/23   Milford, Anderson Malta, FNP  HYDROcodone-acetaminophen (NORCO/VICODIN) 5-325 MG tablet Take 1 tablet by mouth every 4 (four) hours as needed. 03/05/23   Jacalyn Lefevre, MD  metolazone (ZAROXOLYN) 5 MG tablet TAKE 1 TABLET (5 MG TOTAL) BY MOUTH DAILY AS NEEDED (FOR EDEMA). 10/08/16   Wendall Stade, MD  mexiletine (MEXITIL) 200 MG capsule TAKE 1 CAPSULE BY MOUTH 2 TIMES A DAY 11/06/22   Shirlee Latch,  Eliot Ford, MD  potassium chloride SA (KLOR-CON M) 20 MEQ tablet Take 1 tablet (20 mEq total) by mouth daily as needed (when taking torsemide). 02/20/23   Jacklynn Ganong, FNP  REPATHA SURECLICK 140 MG/ML SOAJ INJECT 140MG  INTO THE SKIN EVERY 14 DAYS 04/09/23   Laurey Morale, MD  sertraline (ZOLOFT) 100 MG tablet TAKE 1 TABLET BY MOUTH DAILY 04/08/23   Corwin Levins, MD  spironolactone (ALDACTONE) 25 MG tablet TAKE 1 TABLET BY MOUTH DAILY 12/13/22   Laurey Morale, MD  tiZANidine (ZANAFLEX) 4 MG tablet TAKE 1 TABLET BY MOUTH 2 TIMES A DAY AS NEEDED 04/08/23   Corwin Levins, MD  torsemide (DEMADEX) 20 MG tablet Take 1 tablet (20 mg total) by mouth daily as needed. 02/20/23   Milford, Anderson Malta, FNP  VASCEPA 1 g capsule  Take 2 capsules (2 g total) by mouth 2 (two) times daily. 08/19/22   Laurey Morale, MD      Allergies    Levaquin [levofloxacin], Ace inhibitors, Crestor [rosuvastatin], Lunesta [eszopiclone], and Zocor [simvastatin]    Review of Systems   Review of Systems  HENT:  Positive for nosebleeds.     Physical Exam Updated Vital Signs BP (!) 124/94   Pulse 92   Resp 18   SpO2 99%  Physical Exam Vitals and nursing note reviewed.  Constitutional:      General: He is not in acute distress.    Appearance: Normal appearance. He is not ill-appearing.  HENT:     Head: Normocephalic and atraumatic.     Nose: Nose normal.     Comments: Blood noted in the left nare.  No active bleed noted. Eyes:     Conjunctiva/sclera: Conjunctivae normal.  Cardiovascular:     Rate and Rhythm: Normal rate and regular rhythm.  Pulmonary:     Effort: Pulmonary effort is normal. No respiratory distress.  Musculoskeletal:        General: No deformity.  Skin:    Findings: No rash.  Neurological:     Mental Status: He is alert.     ED Results / Procedures / Treatments   Labs (all labs ordered are listed, but only abnormal results are displayed) Labs Reviewed - No data to display  EKG None  Radiology No results found.  Procedures Procedures    Medications Ordered in ED Medications - No data to display  ED Course/ Medical Decision Making/ A&P                                 Medical Decision Making  48 year old male with past medical history significant for CAD on aspirin and Plavix presents today for concern of recurrent intermittent epistaxis episodes since mid March after the initial traumatic injury.  2 episodes in the past 12 hours.  This was controlled in the emergency department with direct pressure.  I did educate patient on how to apply pressure.  He states he held his aspirin and Plavix.  His stents were at least multiple years ago.  He is unsure as to why he is still on both.  I was  unable to figure this out through chart review.  He will call his cardiologist tomorrow seeking guidance on his antiplatelet therapy.  I spoke to Dr. Christia Reading who will notify his office so patient can get an appointment scheduled for this week.  Strict return precautions discussed.  Patient voices  understanding and is in agreement with plan.   Final Clinical Impression(s) / ED Diagnoses Final diagnoses:  Epistaxis    Rx / DC Orders ED Discharge Orders     None         Marita Kansas, PA-C 05/25/23 1319    Rolan Bucco, MD 05/25/23 [phone]

## 2023-05-25 NOTE — ED Triage Notes (Addendum)
 Pt report nose bleed left nostril since hit in nose on 05/07/23. Nose bleeds intermittently. Seen by Urgent care x 2  Stopped taking plavix and asa a week ago. Left nostril currently bleeding and pressure being applied

## 2023-05-25 NOTE — Discharge Instructions (Addendum)
 Your exam today was overall reassuring.  Nosebleed controlled with direct pressure.  If you do have a recurrent bleed lean forward and pinch her nose at the bridge for 15 to 20 minutes without letting go to check to see if it stop.  If it is uncontrolled still then you can return for evaluation and we may need to place packing material.  Please call the ENT office in the morning for an appointment.  Let them know you are seen in the emergency department today.  They state they will be looking forward to your call to schedule you for this upcoming week.  Information attached above for Dr. Jenne Pane office.

## 2023-05-25 NOTE — ED Notes (Signed)
 Pt alert and oriented X 4 at the time of discharge. RR even and unlabored. No acute distress noted. Pt verbalized understanding of discharge instructions as discussed. Pt ambulatory to lobby at time of discharge.

## 2023-05-26 NOTE — Telephone Encounter (Signed)
 LMOM for return call

## 2023-05-29 DIAGNOSIS — R040 Epistaxis: Secondary | ICD-10-CM | POA: Diagnosis not present

## 2023-05-29 DIAGNOSIS — Z7902 Long term (current) use of antithrombotics/antiplatelets: Secondary | ICD-10-CM | POA: Insufficient documentation

## 2023-05-30 DIAGNOSIS — I4729 Other ventricular tachycardia: Secondary | ICD-10-CM | POA: Diagnosis not present

## 2023-05-31 LAB — BASIC METABOLIC PANEL WITH GFR
BUN/Creatinine Ratio: 14 (ref 9–20)
BUN: 15 mg/dL (ref 6–24)
CO2: 22 mmol/L (ref 20–29)
Calcium: 9.6 mg/dL (ref 8.7–10.2)
Chloride: 102 mmol/L (ref 96–106)
Creatinine, Ser: 1.05 mg/dL (ref 0.76–1.27)
Glucose: 70 mg/dL (ref 70–99)
Potassium: 4.2 mmol/L (ref 3.5–5.2)
Sodium: 141 mmol/L (ref 134–144)
eGFR: 88 mL/min/{1.73_m2} (ref >59)

## 2023-05-31 LAB — MAGNESIUM: Magnesium: 1.9 mg/dL (ref 1.6–2.3)

## 2023-06-02 NOTE — Telephone Encounter (Signed)
 Patient had labs drawn on 05/30/23.

## 2023-06-03 ENCOUNTER — Other Ambulatory Visit: Payer: Self-pay | Admitting: *Deleted

## 2023-06-03 ENCOUNTER — Ambulatory Visit: Payer: BC Managed Care – PPO

## 2023-06-03 DIAGNOSIS — I255 Ischemic cardiomyopathy: Secondary | ICD-10-CM | POA: Diagnosis not present

## 2023-06-03 MED ORDER — MAGNESIUM OXIDE 400 MG PO TABS: 400.0000 mg | ORAL_TABLET | Freq: Every day | ORAL | 3 refills | Status: AC

## 2023-06-04 LAB — CUP PACEART REMOTE DEVICE CHECK
Battery Remaining Longevity: 83 mo
Battery Remaining Percentage: 81 %
Battery Voltage: 3.04 V
Brady Statistic RV Percent Paced: 0 %
Date Time Interrogation Session: 20250415020018
HighPow Impedance: 55 Ohm
HighPow Impedance: 55 Ohm
Implantable Lead Connection Status: 753985
Implantable Lead Implant Date: 20111010
Implantable Lead Location: 753860
Implantable Lead Model: 7121
Implantable Pulse Generator Implant Date: 20230417
Lead Channel Impedance Value: 1175 Ohm
Lead Channel Pacing Threshold Amplitude: 2.5 V
Lead Channel Pacing Threshold Pulse Width: 1 ms
Lead Channel Sensing Intrinsic Amplitude: 11.5 mV
Lead Channel Setting Pacing Amplitude: 2.75 V
Lead Channel Setting Pacing Pulse Width: 1 ms
Lead Channel Setting Sensing Sensitivity: 0.5 mV
Pulse Gen Serial Number: 8926353
Zone Setting Status: 755011

## 2023-06-12 ENCOUNTER — Encounter: Payer: Self-pay | Admitting: Internal Medicine

## 2023-06-24 ENCOUNTER — Other Ambulatory Visit (HOSPITAL_COMMUNITY): Payer: Self-pay | Admitting: Cardiology

## 2023-06-26 ENCOUNTER — Ambulatory Visit (INDEPENDENT_AMBULATORY_CARE_PROVIDER_SITE_OTHER)

## 2023-06-26 ENCOUNTER — Ambulatory Visit (INDEPENDENT_AMBULATORY_CARE_PROVIDER_SITE_OTHER): Admitting: Internal Medicine

## 2023-06-26 VITALS — BP 120/74 | HR 80 | Temp 97.9°F | Ht 74.0 in | Wt 213.0 lb

## 2023-06-26 DIAGNOSIS — R059 Cough, unspecified: Secondary | ICD-10-CM | POA: Diagnosis not present

## 2023-06-26 DIAGNOSIS — R7302 Impaired glucose tolerance (oral): Secondary | ICD-10-CM | POA: Diagnosis not present

## 2023-06-26 DIAGNOSIS — R062 Wheezing: Secondary | ICD-10-CM | POA: Diagnosis not present

## 2023-06-26 DIAGNOSIS — E559 Vitamin D deficiency, unspecified: Secondary | ICD-10-CM | POA: Diagnosis not present

## 2023-06-26 DIAGNOSIS — J4521 Mild intermittent asthma with (acute) exacerbation: Secondary | ICD-10-CM

## 2023-06-26 DIAGNOSIS — I10 Essential (primary) hypertension: Secondary | ICD-10-CM | POA: Diagnosis not present

## 2023-06-26 DIAGNOSIS — Z95810 Presence of automatic (implantable) cardiac defibrillator: Secondary | ICD-10-CM | POA: Diagnosis not present

## 2023-06-26 DIAGNOSIS — R058 Other specified cough: Secondary | ICD-10-CM

## 2023-06-26 MED ORDER — AMOXICILLIN-POT CLAVULANATE 875-125 MG PO TABS
1.0000 | ORAL_TABLET | Freq: Two times a day (BID) | ORAL | 0 refills | Status: DC
Start: 2023-06-26 — End: 2023-10-21

## 2023-06-26 MED ORDER — BENZONATATE 100 MG PO CAPS: ORAL_CAPSULE | ORAL | 0 refills | Status: AC

## 2023-06-26 MED ORDER — PREDNISONE 10 MG PO TABS: ORAL_TABLET | ORAL | 0 refills | Status: DC

## 2023-06-26 MED ORDER — ALBUTEROL SULFATE HFA 108 (90 BASE) MCG/ACT IN AERS: 1.0000 | INHALATION_SPRAY | Freq: Four times a day (QID) | RESPIRATORY_TRACT | 11 refills | Status: AC | PRN

## 2023-06-26 NOTE — Patient Instructions (Signed)
 Please take all new medication as prescribed - the antibiotic, cough medicine tessalon , and prednisone   Please continue all other medications as before, and refills have been done if requested - the inhaler  Please have the pharmacy call with any other refills you may need.  Please keep your appointments with your specialists as you may have planned  Please go to the XRAY Department in the first floor for the x-ray testing  You will be contacted by phone if any changes need to be made immediately.  Otherwise, you will receive a letter about your results with an explanation, but please check with MyChart first.

## 2023-06-26 NOTE — Progress Notes (Signed)
 Patient ID: Jason Hogan, male   DOB: 1975/02/20, 48 y.o.   MRN: [id number]        Chief Complaint: follow up prod cough, wheezing, hyperglycemia, htn,        HPI:  Jason Hogan is a 48 y.o. male Here with acute onset mild to mod 2-3 days ST, HA, general weakness and malaise, with prod cough greenish sputum, but Pt denies chest pain, increased sob or doe, wheezing, orthopnea, PND, increased LE swelling, palpitations, dizziness or syncope except for midl whezing sob since yersterday.  Pt denies chest pain, increased sob or doe, wheezing, orthopnea, PND, increased LE swelling, palpitations, dizziness or syncope.   Pt denies polydipsia, polyuria, or new focal neuro s/s.    Pt denies recent wt loss, night sweats, loss of appetite, or other constitutional symptoms         Wt Readings from Last 3 Encounters:  06/26/23 213 lb (96.6 kg)  04/09/23 220 lb (99.8 kg)  03/27/23 214 lb (97.1 kg)   BP Readings from Last 3 Encounters:  06/26/23 120/74  05/25/23 130/86  05/19/23 121/80         Past Medical History:  Diagnosis Date   Acute Myocardial Infarction 08/18/2009   Qualifier: Diagnosis of  By: Nadean August     Allergic rhinitis, cause unspecified 10/03/2011   Anxiety 10/03/2011   Asthma 09/20/2006   Qualifier: Diagnosis of  By: Autry Legions MD, Alveda Aures    Automatic implantable cardiac defibrillator in situ 01/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 Qualifier: Diagnosis of  By: Stann Earnest, MD, Lovena Rubinstein    CAD (coronary artery disease)    a. large anterior MI 2011 s/p PTCA/DES to 100%. b. occluded LAD with occluded collateralized silent RCA, balloon angioplasty to stented segment of LAD and DES x1 to mid circumflex in 10/2017.   CHF (congestive heart failure) (HCC)    Cholelithiasis 06/22/2007   Qualifier: Diagnosis of  By: Jarold Merlin    Chronic systolic heart failure (HCC) 09/18/2009   Qualifier: Diagnosis of  By: Nadean August     Depressive disorder, not elsewhere  classified 06/22/2007   Qualifier: Diagnosis of  By: Jarold Merlin    History of gout    History of kidney stones    Hyperlipidemia 81/2011   Hypertension    Impaired glucose tolerance 10/05/2012   Insomnia, unspecified 06/22/2007   Qualifier: Diagnosis of  By: Autry Legions MD, Alveda Aures    Ischemic cardiomyopathy 10/20/2009   Qualifier: Diagnosis of  By: Rodolfo Clan, MD, Lavella Poser    Low testosterone  10/14/2013   Migraine Headache 09/18/2009   Qualifier: Diagnosis of  By: Nadean August     Renal calculus    Renal Insufficiency 09/18/2009   Qualifier: Diagnosis of  By: Nadean August     Respiratory failure (HCC) 08/2009   Rhabdomyolysis 09/18/2009   Qualifier: Diagnosis of  By: Nadean August     VT (ventricular tachycardia) [facility])    Past Surgical History:  Procedure Laterality Date   CARDIAC CATHETERIZATION N/A 05/17/2015   Procedure: Left Heart Cath and Coronary Angiography;  Surgeon: Loyde Rule, MD;  Location: [facility] INVASIVE CV LAB;  Service: Cardiovascular;  Laterality: N/A;   CARDIAC CATHETERIZATION  2012   CORONARY ANGIOPLASTY WITH STENT PLACEMENT  2011   CORONARY STENT INTERVENTION N/A 11/14/2017   Procedure: CORONARY STENT INTERVENTION;  Surgeon: Odie Benne, MD;  Location: [facility] INVASIVE CV LAB;  Service: Cardiovascular;  Laterality: N/A;  ICD GENERATOR CHANGEOUT N/A 06/04/2021   Procedure: ICD GENERATOR CHANGEOUT;  Surgeon: Verona Goodwill, MD;  Location: [facility] INVASIVE CV LAB;  Service: Cardiovascular;  Laterality: N/A;   ICD IMPLANT  11/2009   "w/pacing function"   RIGHT/LEFT HEART CATH AND CORONARY ANGIOGRAPHY N/A 11/14/2017   Procedure: RIGHT/LEFT HEART CATH AND CORONARY ANGIOGRAPHY;  Surgeon: Odie Benne, MD;  Location: [facility] INVASIVE CV LAB;  Service: Cardiovascular;  Laterality: N/A;    reports that he quit smoking about 29 years ago. His smoking use included cigarettes. He started smoking about 31 years ago. He has a 0.2 pack-year  smoking history. He has never used smokeless tobacco. He reports that he does not currently use alcohol. He reports that he does not use drugs. family history includes Diabetes in an other family member; Heart attack in his father; Heart disease in an other family member; Hypothyroidism in an other family member. Allergies  Allergen Reactions   Levaquin  [Levofloxacin ] Other (See Comments)    Joint pain   Ace Inhibitors Cough   Crestor  [Rosuvastatin ] Other (See Comments)    "Sweet cravings"   Lunesta [Eszopiclone] Other (See Comments)    "Bitter taste in mouth"   Zocor [Simvastatin] Other (See Comments)    Memory problem   Current Outpatient Medications on File Prior to Visit  Medication Sig Dispense Refill   allopurinol  (ZYLOPRIM ) 100 MG tablet TAKE 2 TABLETS BY MOUTH DAILY 180 tablet 3   ALPRAZolam  (XANAX ) 0.5 MG tablet TAKE ONE TABLET BY MOUTH DAILY AS NEEDED FOR ANXIETY 30 tablet 5   aspirin  EC 81 MG tablet Take 81 mg by mouth daily.     azelastine (ASTELIN) 0.1 % nasal spray Place 1 spray into both nostrils daily as needed for rhinitis.     budesonide -formoterol  (SYMBICORT ) 80-4.5 MCG/ACT inhaler Inhale 2 puffs into the lungs daily as needed (wheezing).     carvedilol  (COREG ) 25 MG tablet Take 1 tablet (25 mg total) by mouth 2 (two) times daily. 60 tablet 6   clopidogrel  (PLAVIX ) 75 MG tablet TAKE 1 TABLET BY MOUTH DAILY 60 tablet 11   cyclobenzaprine  (FLEXERIL ) 5 MG tablet Take 1 tablet (5 mg total) by mouth 3 (three) times daily as needed. 60 tablet 2   dapagliflozin  propanediol (FARXIGA ) 10 MG TABS tablet TAKE ONE TABLET BY MOUTH DAILY BEFORE BREAKFAST 90 tablet 3   Dulaglutide (TRULICITY) 1.5 MG/0.5ML SOPN Inject 1.5 mg into the skin once a week. Fridays     ENTRESTO  97-103 MG TAKE ONE TABLET BY MOUTH TWICE A DAY 60 tablet 11   ezetimibe  (ZETIA ) 10 MG tablet TAKE 1 TABLET BY MOUTH DAILY 90 tablet 1   HYDROcodone -acetaminophen  (NORCO/VICODIN) 5-325 MG tablet Take 1 tablet by mouth  every 4 (four) hours as needed. 10 tablet 0   magnesium  oxide (MAG-OX) 400 MG tablet Take 1 tablet (400 mg total) by mouth daily. 90 tablet 3   metolazone  (ZAROXOLYN ) 5 MG tablet TAKE 1 TABLET (5 MG TOTAL) BY MOUTH DAILY AS NEEDED (FOR EDEMA). 30 tablet 9   mexiletine (MEXITIL ) 200 MG capsule TAKE 1 CAPSULE BY MOUTH 2 TIMES A DAY 180 capsule 1   potassium chloride  SA (KLOR-CON  M) 20 MEQ tablet Take 1 tablet (20 mEq total) by mouth daily as needed (when taking torsemide ). 30 tablet 6   REPATHA  SURECLICK 140 MG/ML SOAJ INJECT 140MG  INTO THE SKIN EVERY 14 DAYS 2 mL 11   sertraline  (ZOLOFT ) 100 MG tablet TAKE 1 TABLET BY MOUTH DAILY 90 tablet  2   tiZANidine  (ZANAFLEX ) 4 MG tablet TAKE 1 TABLET BY MOUTH 2 TIMES A DAY AS NEEDED 60 tablet 0   torsemide  (DEMADEX ) 20 MG tablet Take 1 tablet (20 mg total) by mouth daily as needed. 30 tablet 6   VASCEPA  1 g capsule Take 2 capsules (2 g total) by mouth 2 (two) times daily. 120 capsule 6   spironolactone  (ALDACTONE ) 25 MG tablet TAKE 1 TABLET BY MOUTH DAILY 90 tablet 1   No current facility-administered medications on file prior to visit.        ROS:  All others reviewed and negative.  Objective        PE:  BP 120/74 (BP Location: Left Arm, Patient Position: Sitting, Cuff Size: Normal)   Pulse 80   Temp 97.9 F (36.6 C) (Oral)   Ht 6\' 2"  (1.88 m)   Wt 213 lb (96.6 kg)   SpO2 97%   BMI 27.35 kg/m                 Constitutional: Pt appears mild ill               HENT: Head: NCAT.                Right Ear: External ear normal.                 Left Ear: External ear normal. Bilat tm's with mild erythema.  Max sinus areas none tender.  Pharynx with mild erythema, no exudate               Eyes: . Pupils are equal, round, and reactive to light. Conjunctivae and EOM are normal               Nose: without d/c or deformity               Neck: Neck supple. Gross normal ROM               Cardiovascular: Normal rate and regular rhythm.                  Pulmonary/Chest: Effort normal and breath sounds decreased without rales or wheezing.                               Neurological: Pt is alert. At baseline orientation, motor grossly intact               Skin: Skin is warm. No rashes, no other new lesions, LE edema - none               Psychiatric: Pt behavior is normal without agitation   Micro: none  Cardiac tracings I have personally interpreted today:  none  Pertinent Radiological findings (summarize): none   Lab Results  Component Value Date   WBC 6.4 03/27/2023   HGB 16.7 03/27/2023   HCT 52.6 (H) 03/27/2023   PLT 251.0 03/27/2023   GLUCOSE 70 05/30/2023   CHOL 256 (H) 02/20/2023   TRIG 189 (H) 02/20/2023   HDL 47 02/20/2023   LDLDIRECT 54.9 08/13/2021   LDLCALC 171 (H) 02/20/2023   ALT 31 03/27/2023   AST 20 03/27/2023   NA 141 05/30/2023   K 4.2 05/30/2023   CL 102 05/30/2023   CREATININE 1.05 05/30/2023   BUN 15 05/30/2023   CO2 22 05/30/2023   TSH 2.09 03/27/2023   PSA 0.30 03/27/2023   INR  1.1 (H) 08/01/2016   HGBA1C 5.4 03/27/2023   Assessment/Plan:  Jason Hogan is a 48 y.o. Other or two or more races [6] male with  has a past medical history of Acute Myocardial Infarction (08/18/2009), Allergic rhinitis, cause unspecified (10/03/2011), Anxiety (10/03/2011), Asthma (09/20/2006), Automatic implantable cardiac defibrillator in situ (01/17/2010), CAD (coronary artery disease), CHF (congestive heart failure) (HCC), Cholelithiasis (06/22/2007), Chronic systolic heart failure (HCC) (03/29/5282), Depressive disorder, not elsewhere classified (06/22/2007), History of gout, History of kidney stones, Hyperlipidemia (81/2011), Hypertension, Impaired glucose tolerance (10/05/2012), Insomnia, unspecified (06/22/2007), Ischemic cardiomyopathy (10/20/2009), Low testosterone  (10/14/2013), Migraine Headache (09/18/2009), Renal calculus, Renal Insufficiency (09/18/2009), Respiratory failure (HCC) (08/2009), Rhabdomyolysis (09/18/2009), and  VT (ventricular tachycardia) (HCC).  Vitamin D  deficiency Last vitamin D  Lab Results  Component Value Date   VD25OH 19.54 (L) 03/27/2023   Low to start oral replacement   Asthma With mild exacerbation, for prednisone  taper, inhaler prn  Impaired glucose tolerance Lab Results  Component Value Date   HGBA1C 5.4 03/27/2023   Stable, pt to continue current medical treatment  - diet, wt control   Hypertension BP Readings from Last 3 Encounters:  06/26/23 120/74  05/25/23 130/86  05/19/23 121/80   Stable, pt to continue medical treatment coreg  25 bid   Productive cough Mild to mod, c/w bronchiitis vs pna, for antibx course augmentin  bid course, tessalon  perle prn,,for  cxr,  to f/u any worsening symptoms or concerns  Followup: Return if symptoms worsen or fail to improve.  Rosalia Colonel, MD 06/28/2023 9:34 PM [facility]

## 2023-06-27 ENCOUNTER — Encounter: Payer: Self-pay | Admitting: Internal Medicine

## 2023-06-28 ENCOUNTER — Encounter: Payer: Self-pay | Admitting: Internal Medicine

## 2023-06-28 DIAGNOSIS — R058 Other specified cough: Secondary | ICD-10-CM | POA: Insufficient documentation

## 2023-06-28 NOTE — Assessment & Plan Note (Signed)
 Last vitamin D  Lab Results  Component Value Date   VD25OH 19.54 (L) 03/27/2023   Low to start oral replacement

## 2023-06-28 NOTE — Assessment & Plan Note (Signed)
 With mild exacerbation, for prednisone  taper, inhaler prn

## 2023-06-28 NOTE — Assessment & Plan Note (Addendum)
 Mild to mod, c/w bronchiitis vs pna, for antibx course augmentin  bid course, tessalon  perle prn,,for  cxr,  to f/u any worsening symptoms or concerns

## 2023-06-28 NOTE — Assessment & Plan Note (Signed)
 BP Readings from Last 3 Encounters:  06/26/23 120/74  05/25/23 130/86  05/19/23 121/80   Stable, pt to continue medical treatment coreg  25 bid

## 2023-06-28 NOTE — Assessment & Plan Note (Signed)
 Lab Results  Component Value Date   HGBA1C 5.4 03/27/2023   Stable, pt to continue current medical treatment  - diet, wt control

## 2023-07-18 NOTE — Progress Notes (Signed)
 Remote ICD transmission.

## 2023-07-18 NOTE — Addendum Note (Signed)
 Addended by: Lott Rouleau A on: 07/18/2023 03:24 PM   Modules accepted: Orders

## 2023-07-21 ENCOUNTER — Encounter: Admitting: Internal Medicine

## 2023-07-21 ENCOUNTER — Encounter (HOSPITAL_BASED_OUTPATIENT_CLINIC_OR_DEPARTMENT_OTHER): Payer: Self-pay

## 2023-07-23 ENCOUNTER — Ambulatory Visit: Attending: Student | Admitting: Student

## 2023-07-23 ENCOUNTER — Encounter: Payer: Self-pay | Admitting: Student

## 2023-07-23 VITALS — BP 116/82 | HR 80 | Ht 74.0 in | Wt 211.0 lb

## 2023-07-23 DIAGNOSIS — I5022 Chronic systolic (congestive) heart failure: Secondary | ICD-10-CM

## 2023-07-23 DIAGNOSIS — I493 Ventricular premature depolarization: Secondary | ICD-10-CM | POA: Diagnosis not present

## 2023-07-23 DIAGNOSIS — Z95810 Presence of automatic (implantable) cardiac defibrillator: Secondary | ICD-10-CM

## 2023-07-23 DIAGNOSIS — I4729 Other ventricular tachycardia: Secondary | ICD-10-CM

## 2023-07-23 LAB — CUP PACEART INCLINIC DEVICE CHECK
Battery Remaining Longevity: 84 mo
Brady Statistic RV Percent Paced: 0 %
Date Time Interrogation Session: 20250604101522
HighPow Impedance: 57.7734
Implantable Lead Connection Status: 753985
Implantable Lead Implant Date: 20111010
Implantable Lead Location: 753860
Implantable Lead Model: 7121
Implantable Pulse Generator Implant Date: 20230417
Lead Channel Impedance Value: 1225 Ohm
Lead Channel Pacing Threshold Amplitude: 2.5 V
Lead Channel Pacing Threshold Amplitude: 3.5 V
Lead Channel Pacing Threshold Pulse Width: 1 ms
Lead Channel Pacing Threshold Pulse Width: 1.2 ms
Lead Channel Sensing Intrinsic Amplitude: 11.5 mV
Lead Channel Setting Pacing Amplitude: 2.75 V
Lead Channel Setting Pacing Pulse Width: 1.2 ms
Lead Channel Setting Sensing Sensitivity: 0.5 mV
Pulse Gen Serial Number: 8926353
Zone Setting Status: 755011

## 2023-07-23 NOTE — Progress Notes (Signed)
  Electrophysiology Office Note:   ID:  Jason Hogan, DOB [birth year], MRN [id number]  Primary Cardiologist: Janelle Mediate, MD Electrophysiologist: Richardo Chandler, MD      History of Present Illness:   Jason Hogan is a 48 y.o. male with h/o ICM, PVCs, NSVT, and chronic systolic CHF seen today for routine electrophysiology followup.   Since last being seen in our clinic the patient reports doing OK. He has mild chest pressure in the mornings and occasionally with activity, but not daily or regularly. Sometimes he has mild chest discomfort wrestling with the kids, but can mow the yard without issue. No sustained palpitations. Denies edema, hasn't needed his torsemide .   Review of systems complete and found to be negative unless listed in HPI.   EP Information / Studies Reviewed:    EKG is ordered today. Personal review as below.  EKG Interpretation Date/Time:  Wednesday July 23 2023 09:22:12 EDT Ventricular Rate:  80 PR Interval:  190 QRS Duration:  100 QT Interval:  370 QTC Calculation: 426 R Axis:   94  Text Interpretation: Normal sinus rhythm Anterolateral infarct , age undetermined When compared with ECG of 05-Mar-2023 09:47, PREVIOUS ECG IS PRESENT Confirmed by Pilar Bridge [phone]) on 07/23/2023 9:37:46 AM    ICD Interrogation-  reviewed in detail today,  See PACEART report.  Arrhythmia/Device History Abbott Single Chamber ICD implanted 2011, gen change 06/04/2021 for CHF   Physical Exam:   VS:  BP 116/82 (BP Location: Right Arm, Patient Position: Sitting, Cuff Size: Normal)   Pulse 80   Ht 6\' 2"  (1.88 m)   Wt 211 lb (95.7 kg)   SpO2 94%   BMI 27.09 kg/m    Wt Readings from Last 3 Encounters:  07/23/23 211 lb (95.7 kg)  06/26/23 213 lb (96.6 kg)  04/09/23 220 lb (99.8 kg)     GEN: No acute distress  NECK: No JVD; No carotid bruits CARDIAC: Regular rate and rhythm, no murmurs, rubs, gallops RESPIRATORY:  Clear to auscultation without rales, wheezing or rhonchi   ABDOMEN: Soft, non-tender, non-distended EXTREMITIES:  No edema; No deformity   ASSESSMENT AND PLAN:    Chronic systolic CHF  s/p Abbott single chamber ICD  Ischemic cardiomyopathy euvolemic today Stable on an appropriate medical regimen Normal ICD function See Pace Art report Changed RV pulse width to 1.2 ms with gradual up trend in threshold. He does not pace, but worry about long term lead quality if he were to need ATP or therapy. Will schedule next visit with Dr. Marven Slimmer or Dr. Daneil Dunker, and will review with them in the interim, in case they think needs to be address sooner rather than later.   H/o VT NSVT No further sustained episodes.  Continue coreg  25 mg BID Continue mexitil  200 mg BID  Disposition:   Follow up with Dr. Marven Slimmer or Dr. Daneil Dunker in 6 months to establish from Dr. Rodolfo Clan.    Signed, Tylene Galla, PA-C

## 2023-07-23 NOTE — Patient Instructions (Signed)
 Medication Instructions:  No medication changes today. *If you need a refill on your cardiac medications before your next appointment, please call your pharmacy*  Lab Work: No labwork ordered today. If you have labs (blood work) drawn today and your tests are completely normal, you will receive your results only by: MyChart Message (if you have MyChart) OR A paper copy in the mail If you have any lab test that is abnormal or we need to change your treatment, we will call you to review the results.  Testing/Procedures: No testing ordered today  Follow-Up: At [facility], you and your health needs are our priority.  As part of our continuing mission to provide you with exceptional heart care, our providers are all part of one team.  This team includes your primary Cardiologist (physician) and Advanced Practice Providers or APPs (Physician Assistants and Nurse Practitioners) who all work together to provide you with the care you need, when you need it.  Your next appointment:   6 month(s)  Provider:   Dr. Marven Slimmer or Dr. Daneil Dunker Only  We recommend signing up for the patient portal called "MyChart".  Sign up information is provided on this After Visit Summary.  MyChart is used to connect with patients for Virtual Visits (Telemedicine).  Patients are able to view lab/test results, encounter notes, upcoming appointments, etc.  Non-urgent messages can be sent to your provider as well.   To learn more about what you can do with MyChart, go to [email or web address].

## 2023-07-28 ENCOUNTER — Institutional Professional Consult (permissible substitution) (INDEPENDENT_AMBULATORY_CARE_PROVIDER_SITE_OTHER)

## 2023-07-29 ENCOUNTER — Institutional Professional Consult (permissible substitution) (INDEPENDENT_AMBULATORY_CARE_PROVIDER_SITE_OTHER): Admitting: Otolaryngology

## 2023-08-06 ENCOUNTER — Other Ambulatory Visit (HOSPITAL_COMMUNITY): Payer: Self-pay | Admitting: Cardiology

## 2023-08-12 DIAGNOSIS — H10411 Chronic giant papillary conjunctivitis, right eye: Secondary | ICD-10-CM | POA: Diagnosis not present

## 2023-08-26 DIAGNOSIS — H10411 Chronic giant papillary conjunctivitis, right eye: Secondary | ICD-10-CM | POA: Diagnosis not present

## 2023-08-28 ENCOUNTER — Encounter (HOSPITAL_COMMUNITY): Payer: Self-pay | Admitting: Cardiology

## 2023-09-08 DIAGNOSIS — M9905 Segmental and somatic dysfunction of pelvic region: Secondary | ICD-10-CM | POA: Diagnosis not present

## 2023-09-08 DIAGNOSIS — M9903 Segmental and somatic dysfunction of lumbar region: Secondary | ICD-10-CM | POA: Diagnosis not present

## 2023-09-08 DIAGNOSIS — M9908 Segmental and somatic dysfunction of rib cage: Secondary | ICD-10-CM | POA: Diagnosis not present

## 2023-09-08 DIAGNOSIS — M9901 Segmental and somatic dysfunction of cervical region: Secondary | ICD-10-CM | POA: Diagnosis not present

## 2023-09-08 DIAGNOSIS — M9902 Segmental and somatic dysfunction of thoracic region: Secondary | ICD-10-CM | POA: Diagnosis not present

## 2023-09-09 MED ORDER — DAPAGLIFLOZIN PROPANEDIOL 10 MG PO TABS: 10.0000 mg | ORAL_TABLET | Freq: Every day | ORAL | 3 refills | Status: AC

## 2023-09-09 MED ORDER — VASCEPA 1 G PO CAPS: 2.0000 g | ORAL_CAPSULE | Freq: Two times a day (BID) | ORAL | 6 refills | Status: DC

## 2023-09-10 DIAGNOSIS — M9903 Segmental and somatic dysfunction of lumbar region: Secondary | ICD-10-CM | POA: Diagnosis not present

## 2023-09-10 DIAGNOSIS — M9908 Segmental and somatic dysfunction of rib cage: Secondary | ICD-10-CM | POA: Diagnosis not present

## 2023-09-10 DIAGNOSIS — M9902 Segmental and somatic dysfunction of thoracic region: Secondary | ICD-10-CM | POA: Diagnosis not present

## 2023-09-10 DIAGNOSIS — M9901 Segmental and somatic dysfunction of cervical region: Secondary | ICD-10-CM | POA: Diagnosis not present

## 2023-09-11 ENCOUNTER — Ambulatory Visit

## 2023-09-11 DIAGNOSIS — I255 Ischemic cardiomyopathy: Secondary | ICD-10-CM

## 2023-09-12 LAB — CUP PACEART REMOTE DEVICE CHECK
Battery Remaining Longevity: 80 mo
Battery Remaining Percentage: 78 %
Battery Voltage: 3.02 V
Brady Statistic RV Percent Paced: 0 %
Date Time Interrogation Session: 20250724152432
HighPow Impedance: 52 Ohm
HighPow Impedance: 52 Ohm
Implantable Lead Connection Status: 753985
Implantable Lead Implant Date: 20111010
Implantable Lead Location: 753860
Implantable Lead Model: 7121
Implantable Pulse Generator Implant Date: 20230417
Lead Channel Impedance Value: 1200 Ohm
Lead Channel Pacing Threshold Amplitude: 2.5 V
Lead Channel Pacing Threshold Pulse Width: 1.2 ms
Lead Channel Sensing Intrinsic Amplitude: 11.5 mV
Lead Channel Setting Pacing Amplitude: 2.75 V
Lead Channel Setting Pacing Pulse Width: 1.2 ms
Lead Channel Setting Sensing Sensitivity: 0.5 mV
Pulse Gen Serial Number: 8926353
Zone Setting Status: 755011

## 2023-09-15 DIAGNOSIS — M9901 Segmental and somatic dysfunction of cervical region: Secondary | ICD-10-CM | POA: Diagnosis not present

## 2023-09-15 DIAGNOSIS — M9908 Segmental and somatic dysfunction of rib cage: Secondary | ICD-10-CM | POA: Diagnosis not present

## 2023-09-15 DIAGNOSIS — M9903 Segmental and somatic dysfunction of lumbar region: Secondary | ICD-10-CM | POA: Diagnosis not present

## 2023-09-15 DIAGNOSIS — M9902 Segmental and somatic dysfunction of thoracic region: Secondary | ICD-10-CM | POA: Diagnosis not present

## 2023-09-16 ENCOUNTER — Ambulatory Visit: Payer: Self-pay | Admitting: Cardiovascular Disease

## 2023-09-17 DIAGNOSIS — M9902 Segmental and somatic dysfunction of thoracic region: Secondary | ICD-10-CM | POA: Diagnosis not present

## 2023-09-17 DIAGNOSIS — M9901 Segmental and somatic dysfunction of cervical region: Secondary | ICD-10-CM | POA: Diagnosis not present

## 2023-09-17 DIAGNOSIS — M9908 Segmental and somatic dysfunction of rib cage: Secondary | ICD-10-CM | POA: Diagnosis not present

## 2023-09-17 DIAGNOSIS — M9903 Segmental and somatic dysfunction of lumbar region: Secondary | ICD-10-CM | POA: Diagnosis not present

## 2023-09-22 DIAGNOSIS — M9902 Segmental and somatic dysfunction of thoracic region: Secondary | ICD-10-CM | POA: Diagnosis not present

## 2023-09-22 DIAGNOSIS — M9901 Segmental and somatic dysfunction of cervical region: Secondary | ICD-10-CM | POA: Diagnosis not present

## 2023-09-22 DIAGNOSIS — M9903 Segmental and somatic dysfunction of lumbar region: Secondary | ICD-10-CM | POA: Diagnosis not present

## 2023-09-22 DIAGNOSIS — M9908 Segmental and somatic dysfunction of rib cage: Secondary | ICD-10-CM | POA: Diagnosis not present

## 2023-09-25 ENCOUNTER — Telehealth (HOSPITAL_COMMUNITY): Payer: Self-pay

## 2023-09-25 ENCOUNTER — Other Ambulatory Visit (HOSPITAL_COMMUNITY): Payer: Self-pay

## 2023-09-26 ENCOUNTER — Other Ambulatory Visit (HOSPITAL_COMMUNITY): Payer: Self-pay

## 2023-09-26 DIAGNOSIS — E785 Hyperlipidemia, unspecified: Secondary | ICD-10-CM

## 2023-09-26 MED ORDER — EZETIMIBE 10 MG PO TABS: 10.0000 mg | ORAL_TABLET | Freq: Every day | ORAL | 1 refills | Status: AC

## 2023-09-29 DIAGNOSIS — M9908 Segmental and somatic dysfunction of rib cage: Secondary | ICD-10-CM | POA: Diagnosis not present

## 2023-09-29 DIAGNOSIS — M9903 Segmental and somatic dysfunction of lumbar region: Secondary | ICD-10-CM | POA: Diagnosis not present

## 2023-09-29 DIAGNOSIS — M9902 Segmental and somatic dysfunction of thoracic region: Secondary | ICD-10-CM | POA: Diagnosis not present

## 2023-09-29 DIAGNOSIS — M6240 Contracture of muscle, unspecified site: Secondary | ICD-10-CM | POA: Diagnosis not present

## 2023-09-29 DIAGNOSIS — M9901 Segmental and somatic dysfunction of cervical region: Secondary | ICD-10-CM | POA: Diagnosis not present

## 2023-09-29 NOTE — Telephone Encounter (Signed)
 Prior authorization (Key [id number]) for Vascepa  submitted and denied due to the request does not meet the definition of medical necessity found in the member's benefit booklet Myself or another team member will continue to follow up.

## 2023-09-30 ENCOUNTER — Telehealth (HOSPITAL_COMMUNITY): Payer: Self-pay | Admitting: Pharmacist

## 2023-09-30 ENCOUNTER — Encounter (HOSPITAL_COMMUNITY): Payer: Self-pay | Admitting: Pharmacist

## 2023-09-30 NOTE — Telephone Encounter (Signed)
 Advanced Heart Failure Patient Advocate Encounter  Prior Authorization for Vascepa  has been denied, despite being stable on therapy since 2023. Denied because patient is not on statin therapy.  Have drafted appeal letter as patient is intolerant to statins and has tried and failed multiple products. Before submission, patient will need to sign Member Appeal Representation Authorization Form from [facility] website. Have asked patient advocate to reach out and obtain his signature so this can be submitted.  Tinnie Redman, PharmD, BCPS, BCCP, CPP Heart Failure Clinic Pharmacist [phone]

## 2023-10-01 NOTE — Telephone Encounter (Signed)
 Advanced Heart Failure Patient Advocate Encounter  Attempted to call patient regarding signature for appeal authorization, vm full.  Will attempt to call patient again.

## 2023-10-02 ENCOUNTER — Other Ambulatory Visit (HOSPITAL_COMMUNITY): Payer: Self-pay

## 2023-10-02 NOTE — Telephone Encounter (Signed)
 Advanced Heart Failure Patient Advocate Encounter  Sent in Vascepa  appeal to [employer] via efax. Fax number - [phone].

## 2023-10-02 NOTE — Telephone Encounter (Signed)
 Advanced Heart Failure Patient Advocate Encounter  Spoke with patient, he agreed to move forward with appeal.

## 2023-10-06 ENCOUNTER — Other Ambulatory Visit (HOSPITAL_COMMUNITY): Payer: Self-pay

## 2023-10-06 DIAGNOSIS — M9903 Segmental and somatic dysfunction of lumbar region: Secondary | ICD-10-CM | POA: Diagnosis not present

## 2023-10-06 DIAGNOSIS — M9908 Segmental and somatic dysfunction of rib cage: Secondary | ICD-10-CM | POA: Diagnosis not present

## 2023-10-06 DIAGNOSIS — M6240 Contracture of muscle, unspecified site: Secondary | ICD-10-CM | POA: Diagnosis not present

## 2023-10-06 DIAGNOSIS — M9902 Segmental and somatic dysfunction of thoracic region: Secondary | ICD-10-CM | POA: Diagnosis not present

## 2023-10-06 DIAGNOSIS — M9901 Segmental and somatic dysfunction of cervical region: Secondary | ICD-10-CM | POA: Diagnosis not present

## 2023-10-07 ENCOUNTER — Other Ambulatory Visit (HOSPITAL_COMMUNITY): Payer: Self-pay

## 2023-10-09 ENCOUNTER — Other Ambulatory Visit (HOSPITAL_COMMUNITY): Payer: Self-pay

## 2023-10-09 NOTE — Telephone Encounter (Signed)
 Contacted [employer] to check status of appeal. This appeal is in process and should have a determination by 09/14. Will continue to follow up.

## 2023-10-13 DIAGNOSIS — M9901 Segmental and somatic dysfunction of cervical region: Secondary | ICD-10-CM | POA: Diagnosis not present

## 2023-10-13 DIAGNOSIS — M9902 Segmental and somatic dysfunction of thoracic region: Secondary | ICD-10-CM | POA: Diagnosis not present

## 2023-10-13 DIAGNOSIS — M9908 Segmental and somatic dysfunction of rib cage: Secondary | ICD-10-CM | POA: Diagnosis not present

## 2023-10-13 DIAGNOSIS — M6240 Contracture of muscle, unspecified site: Secondary | ICD-10-CM | POA: Diagnosis not present

## 2023-10-13 DIAGNOSIS — M9903 Segmental and somatic dysfunction of lumbar region: Secondary | ICD-10-CM | POA: Diagnosis not present

## 2023-10-15 DIAGNOSIS — M9908 Segmental and somatic dysfunction of rib cage: Secondary | ICD-10-CM | POA: Diagnosis not present

## 2023-10-15 DIAGNOSIS — M9902 Segmental and somatic dysfunction of thoracic region: Secondary | ICD-10-CM | POA: Diagnosis not present

## 2023-10-15 DIAGNOSIS — M9903 Segmental and somatic dysfunction of lumbar region: Secondary | ICD-10-CM | POA: Diagnosis not present

## 2023-10-15 DIAGNOSIS — M6240 Contracture of muscle, unspecified site: Secondary | ICD-10-CM | POA: Diagnosis not present

## 2023-10-15 DIAGNOSIS — M9901 Segmental and somatic dysfunction of cervical region: Secondary | ICD-10-CM | POA: Diagnosis not present

## 2023-10-17 ENCOUNTER — Telehealth (HOSPITAL_COMMUNITY): Payer: Self-pay | Admitting: Cardiology

## 2023-10-17 NOTE — Telephone Encounter (Signed)
 Called to confirm/remind patient of their appointment at the [facility] on 10/17/23.   Appointment:   [] Confirmed  [x] Left mess   [] No answer/No voice mail  [] VM Full/unable to leave message  [] Phone not in service  Patient reminded to bring all medications and/or complete list.  Confirmed patient has transportation. Gave directions, instructed to utilize valet parking.

## 2023-10-21 ENCOUNTER — Ambulatory Visit (HOSPITAL_COMMUNITY): Payer: Self-pay | Admitting: Cardiology

## 2023-10-21 ENCOUNTER — Encounter (HOSPITAL_COMMUNITY): Payer: Self-pay | Admitting: Cardiology

## 2023-10-21 ENCOUNTER — Ambulatory Visit (HOSPITAL_BASED_OUTPATIENT_CLINIC_OR_DEPARTMENT_OTHER)
Admission: RE | Admit: 2023-10-21 | Discharge: 2023-10-21 | Disposition: A | Discharge status: Home / Self Care | Source: Ambulatory Visit | Attending: Cardiology | Admitting: Cardiology

## 2023-10-21 ENCOUNTER — Ambulatory Visit (HOSPITAL_COMMUNITY)
Admission: RE | Admit: 2023-10-21 | Discharge: 2023-10-21 | Disposition: A | Discharge status: Home / Self Care | Source: Ambulatory Visit | Attending: Cardiology | Admitting: Cardiology

## 2023-10-21 ENCOUNTER — Other Ambulatory Visit (HOSPITAL_COMMUNITY): Payer: Self-pay

## 2023-10-21 VITALS — BP 104/70 | HR 85 | Wt 205.6 lb

## 2023-10-21 DIAGNOSIS — I081 Rheumatic disorders of both mitral and tricuspid valves: Secondary | ICD-10-CM | POA: Diagnosis not present

## 2023-10-21 DIAGNOSIS — I2510 Atherosclerotic heart disease of native coronary artery without angina pectoris: Secondary | ICD-10-CM | POA: Diagnosis not present

## 2023-10-21 DIAGNOSIS — I5022 Chronic systolic (congestive) heart failure: Secondary | ICD-10-CM | POA: Diagnosis not present

## 2023-10-21 DIAGNOSIS — Z95810 Presence of automatic (implantable) cardiac defibrillator: Secondary | ICD-10-CM | POA: Insufficient documentation

## 2023-10-21 DIAGNOSIS — R9431 Abnormal electrocardiogram [ECG] [EKG]: Secondary | ICD-10-CM | POA: Diagnosis not present

## 2023-10-21 DIAGNOSIS — Z7984 Long term (current) use of oral hypoglycemic drugs: Secondary | ICD-10-CM | POA: Insufficient documentation

## 2023-10-21 DIAGNOSIS — Z7985 Long-term (current) use of injectable non-insulin antidiabetic drugs: Secondary | ICD-10-CM | POA: Insufficient documentation

## 2023-10-21 DIAGNOSIS — Z955 Presence of coronary angioplasty implant and graft: Secondary | ICD-10-CM | POA: Insufficient documentation

## 2023-10-21 DIAGNOSIS — E785 Hyperlipidemia, unspecified: Secondary | ICD-10-CM | POA: Insufficient documentation

## 2023-10-21 DIAGNOSIS — Z7902 Long term (current) use of antithrombotics/antiplatelets: Secondary | ICD-10-CM | POA: Diagnosis not present

## 2023-10-21 DIAGNOSIS — R42 Dizziness and giddiness: Secondary | ICD-10-CM | POA: Insufficient documentation

## 2023-10-21 DIAGNOSIS — Z5971 Insufficient health insurance coverage: Secondary | ICD-10-CM | POA: Diagnosis not present

## 2023-10-21 DIAGNOSIS — I252 Old myocardial infarction: Secondary | ICD-10-CM | POA: Insufficient documentation

## 2023-10-21 DIAGNOSIS — M791 Myalgia, unspecified site: Secondary | ICD-10-CM | POA: Diagnosis not present

## 2023-10-21 DIAGNOSIS — I110 Hypertensive heart disease with heart failure: Secondary | ICD-10-CM | POA: Insufficient documentation

## 2023-10-21 DIAGNOSIS — I2582 Chronic total occlusion of coronary artery: Secondary | ICD-10-CM | POA: Diagnosis not present

## 2023-10-21 DIAGNOSIS — I493 Ventricular premature depolarization: Secondary | ICD-10-CM | POA: Diagnosis not present

## 2023-10-21 DIAGNOSIS — I25110 Atherosclerotic heart disease of native coronary artery with unstable angina pectoris: Secondary | ICD-10-CM | POA: Insufficient documentation

## 2023-10-21 DIAGNOSIS — Z79899 Other long term (current) drug therapy: Secondary | ICD-10-CM | POA: Diagnosis not present

## 2023-10-21 DIAGNOSIS — I255 Ischemic cardiomyopathy: Secondary | ICD-10-CM | POA: Diagnosis not present

## 2023-10-21 DIAGNOSIS — T82855A Stenosis of coronary artery stent, initial encounter: Secondary | ICD-10-CM | POA: Insufficient documentation

## 2023-10-21 DIAGNOSIS — Z7982 Long term (current) use of aspirin: Secondary | ICD-10-CM | POA: Diagnosis not present

## 2023-10-21 DIAGNOSIS — Z8249 Family history of ischemic heart disease and other diseases of the circulatory system: Secondary | ICD-10-CM | POA: Insufficient documentation

## 2023-10-21 DIAGNOSIS — Z7951 Long term (current) use of inhaled steroids: Secondary | ICD-10-CM | POA: Diagnosis not present

## 2023-10-21 DIAGNOSIS — Y831 Surgical operation with implant of artificial internal device as the cause of abnormal reaction of the patient, or of later complication, without mention of misadventure at the time of the procedure: Secondary | ICD-10-CM | POA: Diagnosis not present

## 2023-10-21 DIAGNOSIS — R002 Palpitations: Secondary | ICD-10-CM | POA: Insufficient documentation

## 2023-10-21 LAB — CBC
HCT: 50.8 % (ref 39.0–52.0)
Hemoglobin: 16.2 g/dL (ref 13.0–17.0)
MCH: 27.1 pg (ref 26.0–34.0)
MCHC: 31.9 g/dL (ref 30.0–36.0)
MCV: 84.9 fL (ref 80.0–100.0)
Platelets: 209 K/uL (ref 150–400)
RBC: 5.98 MIL/uL — ABNORMAL HIGH (ref 4.22–5.81)
RDW: 15.9 % — ABNORMAL HIGH (ref 11.5–15.5)
WBC: 6.6 K/uL (ref 4.0–10.5)
nRBC: 0 % (ref 0.0–0.2)

## 2023-10-21 LAB — LIPID PANEL
Cholesterol: 174 mg/dL (ref 0–200)
HDL: 41 mg/dL (ref >40)
LDL Cholesterol: 106 mg/dL — ABNORMAL HIGH (ref 0–99)
Total CHOL/HDL Ratio: 4.2 ratio
Triglycerides: 134 mg/dL (ref <150)
VLDL: 27 mg/dL (ref 0–40)

## 2023-10-21 LAB — ECHOCARDIOGRAM COMPLETE
Area-P 1/2: 5.62 cm2
Calc EF: 47.2 %
S' Lateral: 3.8 cm
Single Plane A2C EF: 50.5 %
Single Plane A4C EF: 43.3 %

## 2023-10-21 LAB — BASIC METABOLIC PANEL WITH GFR
Anion gap: 11 (ref 5–15)
BUN: 9 mg/dL (ref 6–20)
CO2: 24 mmol/L (ref 22–32)
Calcium: 9.1 mg/dL (ref 8.9–10.3)
Chloride: 105 mmol/L (ref 98–111)
Creatinine, Ser: 0.88 mg/dL (ref 0.61–1.24)
GFR, Estimated: >60 mL/min (ref >60)
Glucose, Bld: 80 mg/dL (ref 70–99)
Potassium: 4.1 mmol/L (ref 3.5–5.1)
Sodium: 140 mmol/L (ref 135–145)

## 2023-10-21 LAB — BRAIN NATRIURETIC PEPTIDE: B Natriuretic Peptide: 38.7 pg/mL (ref 0.0–100.0)

## 2023-10-21 MED ORDER — VASCEPA 1 G PO CAPS: 2.0000 g | ORAL_CAPSULE | Freq: Two times a day (BID) | ORAL | 6 refills | Status: AC

## 2023-10-21 NOTE — Patient Instructions (Signed)
 There has been no changes to your medications.  Labs done today, your results will be available in MyChart, we will contact you for abnormal readings.  Your physician recommends that you schedule a follow-up appointment in: 6 months.  If you have any questions or concerns before your next appointment please send [place] a message through [place] or call our office at [phone].    TO LEAVE A MESSAGE FOR THE NURSE SELECT OPTION 2, PLEASE LEAVE A MESSAGE INCLUDING: YOUR NAME DATE OF BIRTH CALL BACK NUMBER REASON FOR CALL**this is important as we prioritize the call backs  YOU WILL RECEIVE A CALL BACK THE SAME DAY AS LONG AS YOU CALL BEFORE 4:00 PM  At the [facility], you and your health needs are our priority. As part of our continuing mission to provide you with exceptional heart care, we have created designated Provider Care Teams. These Care Teams include your primary Cardiologist (physician) and Advanced Practice Providers (APPs- Physician Assistants and Nurse Practitioners) who all work together to provide you with the care you need, when you need it.   You may see any of the following providers on your designated Care Team at your next follow up: Dr Arvilla Meres Dr Marca Ancona Dr. Dorthula Nettles Dr. Clearnce Hasten Amy Filbert Schilder, NP Robbie Lis, [place] [facility] [place], [place] Brynda Peon, NP [place] Lee, NP Clarisa Kindred, NP Karle Plumber, PharmD Enos Fling, PharmD   Please be sure to bring in all your medications bottles to every appointment.    Thank you for choosing [facility]
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
## 2023-10-21 NOTE — Progress Notes (Signed)
 Medication Samples have been provided to the patient.  Drug name: Farxiga         Strength: 10 mg         Qty: 4 boxes  LOT: [id number]  Exp.Date: 12-18-2025  Dosing instructions: TAKE 1 TABLET DAILY   The patient has been instructed regarding the correct time, dose, and frequency of taking this medication, including desired effects and most common side effects.   Akina Maish M Lexianna Weinrich 2:19 PM 10/21/2023

## 2023-10-22 ENCOUNTER — Other Ambulatory Visit (HOSPITAL_COMMUNITY): Payer: Self-pay

## 2023-10-22 DIAGNOSIS — M9901 Segmental and somatic dysfunction of cervical region: Secondary | ICD-10-CM | POA: Diagnosis not present

## 2023-10-22 DIAGNOSIS — M9905 Segmental and somatic dysfunction of pelvic region: Secondary | ICD-10-CM | POA: Diagnosis not present

## 2023-10-22 DIAGNOSIS — M9902 Segmental and somatic dysfunction of thoracic region: Secondary | ICD-10-CM | POA: Diagnosis not present

## 2023-10-22 DIAGNOSIS — M9903 Segmental and somatic dysfunction of lumbar region: Secondary | ICD-10-CM | POA: Diagnosis not present

## 2023-10-22 DIAGNOSIS — M9908 Segmental and somatic dysfunction of rib cage: Secondary | ICD-10-CM | POA: Diagnosis not present

## 2023-10-24 ENCOUNTER — Other Ambulatory Visit (HOSPITAL_COMMUNITY): Payer: Self-pay

## 2023-10-24 NOTE — Telephone Encounter (Signed)
 Test billing for Vascepa  returns a $25 copay for 30 day supply, indicating that prior authorization has been approved. Will update if effective dates are provided.

## 2023-10-24 NOTE — Telephone Encounter (Addendum)
 Pt aware, agreeable, and verbalized understanding  Referral sent to lipid clinic   ----- Message from Ezra Shuck sent at 10/21/2023  4:43 PM EDT ----- LDL is better but still too high. Goal < 55.  He is on Repatha  and Zetia , would refer to lipid clinic to consider addition of bempedoic acid.  ----- Message ----- From: Interface, Lab In [place] Sent: 10/21/2023   3:25 PM EDT To: Ezra GORMAN Shuck, MD

## 2023-10-24 NOTE — Addendum Note (Signed)
 Addended by: Kati Riggenbach M on: 10/24/2023 10:54 AM   Modules accepted: Orders

## 2023-10-27 DIAGNOSIS — M9903 Segmental and somatic dysfunction of lumbar region: Secondary | ICD-10-CM | POA: Diagnosis not present

## 2023-10-27 DIAGNOSIS — M6240 Contracture of muscle, unspecified site: Secondary | ICD-10-CM | POA: Diagnosis not present

## 2023-10-27 DIAGNOSIS — M9905 Segmental and somatic dysfunction of pelvic region: Secondary | ICD-10-CM | POA: Diagnosis not present

## 2023-10-27 DIAGNOSIS — M9902 Segmental and somatic dysfunction of thoracic region: Secondary | ICD-10-CM | POA: Diagnosis not present

## 2023-10-27 DIAGNOSIS — M9908 Segmental and somatic dysfunction of rib cage: Secondary | ICD-10-CM | POA: Diagnosis not present

## 2023-10-28 ENCOUNTER — Encounter: Payer: Self-pay | Admitting: Cardiology

## 2023-11-10 ENCOUNTER — Other Ambulatory Visit (HOSPITAL_COMMUNITY): Payer: Self-pay

## 2023-11-10 DIAGNOSIS — M9905 Segmental and somatic dysfunction of pelvic region: Secondary | ICD-10-CM | POA: Diagnosis not present

## 2023-11-10 DIAGNOSIS — M9902 Segmental and somatic dysfunction of thoracic region: Secondary | ICD-10-CM | POA: Diagnosis not present

## 2023-11-10 DIAGNOSIS — M9903 Segmental and somatic dysfunction of lumbar region: Secondary | ICD-10-CM | POA: Diagnosis not present

## 2023-11-10 DIAGNOSIS — M9908 Segmental and somatic dysfunction of rib cage: Secondary | ICD-10-CM | POA: Diagnosis not present

## 2023-11-17 DIAGNOSIS — M9905 Segmental and somatic dysfunction of pelvic region: Secondary | ICD-10-CM | POA: Diagnosis not present

## 2023-11-17 DIAGNOSIS — M9908 Segmental and somatic dysfunction of rib cage: Secondary | ICD-10-CM | POA: Diagnosis not present

## 2023-11-17 DIAGNOSIS — M9902 Segmental and somatic dysfunction of thoracic region: Secondary | ICD-10-CM | POA: Diagnosis not present

## 2023-11-17 DIAGNOSIS — M9903 Segmental and somatic dysfunction of lumbar region: Secondary | ICD-10-CM | POA: Diagnosis not present

## 2023-11-20 NOTE — Progress Notes (Signed)
 Remote ICD Transmission

## 2023-12-01 ENCOUNTER — Other Ambulatory Visit (HOSPITAL_COMMUNITY): Payer: Self-pay | Admitting: Cardiology

## 2023-12-01 DIAGNOSIS — M9903 Segmental and somatic dysfunction of lumbar region: Secondary | ICD-10-CM | POA: Diagnosis not present

## 2023-12-01 DIAGNOSIS — M9902 Segmental and somatic dysfunction of thoracic region: Secondary | ICD-10-CM | POA: Diagnosis not present

## 2023-12-01 DIAGNOSIS — M9908 Segmental and somatic dysfunction of rib cage: Secondary | ICD-10-CM | POA: Diagnosis not present

## 2023-12-01 DIAGNOSIS — M9901 Segmental and somatic dysfunction of cervical region: Secondary | ICD-10-CM | POA: Diagnosis not present

## 2023-12-01 DIAGNOSIS — M9905 Segmental and somatic dysfunction of pelvic region: Secondary | ICD-10-CM | POA: Diagnosis not present

## 2023-12-11 ENCOUNTER — Ambulatory Visit (INDEPENDENT_AMBULATORY_CARE_PROVIDER_SITE_OTHER)

## 2023-12-11 DIAGNOSIS — I255 Ischemic cardiomyopathy: Secondary | ICD-10-CM | POA: Diagnosis not present

## 2023-12-11 LAB — CUP PACEART REMOTE DEVICE CHECK
Battery Remaining Longevity: 77 mo
Battery Remaining Percentage: 76 %
Battery Voltage: 3.02 V
Brady Statistic RV Percent Paced: 0 %
Date Time Interrogation Session: 20251023020017
HighPow Impedance: 58 Ohm
HighPow Impedance: 59 Ohm
Implantable Lead Connection Status: 753985
Implantable Lead Implant Date: 20111010
Implantable Lead Location: 753860
Implantable Lead Model: 7121
Implantable Pulse Generator Implant Date: 20230417
Lead Channel Impedance Value: 1225 Ohm
Lead Channel Pacing Threshold Amplitude: 2.5 V
Lead Channel Pacing Threshold Pulse Width: 1.2 ms
Lead Channel Sensing Intrinsic Amplitude: 11.5 mV
Lead Channel Setting Pacing Amplitude: 2.75 V
Lead Channel Setting Pacing Pulse Width: 1.2 ms
Lead Channel Setting Sensing Sensitivity: 0.5 mV
Pulse Gen Serial Number: 8926353
Zone Setting Status: 755011

## 2023-12-12 NOTE — Progress Notes (Signed)
 Remote ICD Transmission

## 2023-12-13 ENCOUNTER — Ambulatory Visit: Payer: Self-pay | Admitting: Student in an Organized Health Care Education/Training Program

## 2023-12-15 DIAGNOSIS — M9902 Segmental and somatic dysfunction of thoracic region: Secondary | ICD-10-CM | POA: Diagnosis not present

## 2023-12-15 DIAGNOSIS — M9908 Segmental and somatic dysfunction of rib cage: Secondary | ICD-10-CM | POA: Diagnosis not present

## 2023-12-15 DIAGNOSIS — M9903 Segmental and somatic dysfunction of lumbar region: Secondary | ICD-10-CM | POA: Diagnosis not present

## 2023-12-15 DIAGNOSIS — M9905 Segmental and somatic dysfunction of pelvic region: Secondary | ICD-10-CM | POA: Diagnosis not present

## 2023-12-22 ENCOUNTER — Ambulatory Visit: Attending: Cardiovascular Disease | Admitting: Pharmacist Clinician (PhC)/ Clinical Pharmacy Specialist

## 2023-12-22 ENCOUNTER — Encounter: Payer: Self-pay | Admitting: Pharmacist Clinician (PhC)/ Clinical Pharmacy Specialist

## 2023-12-22 ENCOUNTER — Other Ambulatory Visit (HOSPITAL_COMMUNITY): Payer: Self-pay | Admitting: Cardiology

## 2023-12-22 DIAGNOSIS — E78019 Familial hypercholesterolemia, unspecified: Secondary | ICD-10-CM

## 2023-12-22 DIAGNOSIS — I5022 Chronic systolic (congestive) heart failure: Secondary | ICD-10-CM

## 2023-12-22 MED ORDER — ROSUVASTATIN CALCIUM 5 MG PO TABS: ORAL_TABLET | ORAL | 3 refills | Status: AC

## 2023-12-22 NOTE — Progress Notes (Signed)
 Office Visit    Patient Name: Jason Hogan Date of Encounter: 12/22/2023  Primary Care Provider:  Norleen Lynwood ORN, MD Primary Cardiologist:  Maude Emmer, MD  Chief Complaint    Hyperlipidemia - familial  Significant Past Medical History   CAD Premature; 2011 MI, occluded RCA w/collaterals stent to LAD; 2019 had in-stent restenosis and DES to mCLx  HFrEF 9/25 echo w/EF at 30-35%, several areas of akinesis; on Entresto , carvedilol ; torsemide  prn     Allergies  Allergen Reactions   Levaquin  [Levofloxacin ] Other (See Comments)    Joint pain   Ace Inhibitors Cough   Atorvastatin      Myalgia, nausea, chest pain   Crestor  [Rosuvastatin ] Other (See Comments)    Sweet cravings   Lunesta [Eszopiclone] Other (See Comments)    Bitter taste in mouth   Zocor [Simvastatin] Other (See Comments)    Memory problem    History of Present Illness    Jason Hogan is a 48 y.o. male patient of Dr Rolan, in the office today to discuss options for cholesterol management.  Jason Hogan had an He was most recently seen by Dr. Rolan in September and was noted to be statin intolerant.  At the time of his MI in 2011 his LDL cholesterol was 338.  He was able to tolerate lower doses of statins for several years, but as dose was increased he developed myalgias.  He was on Repatha  for some time, but stopped in June of this year and just resumed about 2 weeks ago.   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    Insurance Carrier: [employer]  Pharm[facility]:   [facility]    LDL Cholesterol goal:  LDL < 55  Current Medications:   Repatha  140 mg q14d, ezetimibe  10 mg daily, icosapent  ethyl 2 gm bid  Previously tried:  rosuvastatin , atorvastatin  - myalgias  Family Hx: father had MI at 77 with CABG x 4,then fine until 53 when had pacemaker, died recently at 54; siblings both with high cholesterol but  no disease yet; mom now 44 no heart disease; 3 kids (14,11,9)  Social Hx: Tobacco: no Alcohol: occasional beer     Diet:  mostly home cooked meals, not much pork; plenty of vegetables - fresh; doesn't snack much, now on Mounjaro (was at 230 lb) down to 190;    Exercise: gym twice weekly now (some limitations 2/2 HF) walks with dogs and kids   Accessory Clinical Findings   Lab Results  Component Value Date   CHOL 174 10/21/2023   HDL 41 10/21/2023   LDLCALC 106 (H) 10/21/2023   LDLDIRECT 54.9 08/13/2021   TRIG 134 10/21/2023   CHOLHDL 4.2 10/21/2023    No results found for: LIPOA  Lab Results  Component Value Date   ALT 31 03/27/2023   AST 20 03/27/2023   ALKPHOS 67 03/27/2023   BILITOT 0.4 03/27/2023   Lab Results  Component Value Date   CREATININE 0.88 10/21/2023   BUN 9 10/21/2023   NA 140 10/21/2023   K 4.1 10/21/2023   CL 105 10/21/2023   CO2 24 10/21/2023   Lab Results  Component Value Date   HGBA1C 5.4 03/27/2023    Home Medications    Current Outpatient Medications  Medication Sig Dispense Refill   rosuvastatin  (CRESTOR ) 5 MG tablet Take 1 tablet by mouth three times weekly (MWF) 12 tablet 3  albuterol  (VENTOLIN  HFA) 108 (90 Base) MCG/ACT inhaler Inhale 1-2 puffs into the lungs every 6 (six) hours as needed for wheezing or shortness of breath. 18 g 11   allopurinol  (ZYLOPRIM ) 100 MG tablet TAKE 2 TABLETS BY MOUTH DAILY 180 tablet 3   ALPRAZolam  (XANAX ) 0.5 MG tablet TAKE ONE TABLET BY MOUTH DAILY AS NEEDED FOR ANXIETY 30 tablet 5   aspirin  EC 81 MG tablet Take 81 mg by mouth daily.     azelastine (ASTELIN) 0.1 % nasal spray Place 1 spray into both nostrils daily as needed for rhinitis.     benzonatate  (TESSALON  PERLES) 100 MG capsule 1-2  tab by mouth every 8 hrs as needed for cough 60 capsule 0   budesonide -formoterol  (SYMBICORT ) 80-4.5 MCG/ACT inhaler Inhale 2 puffs into the lungs daily as needed (wheezing).     carvedilol  (COREG ) 25 MG tablet Take  1 tablet (25 mg total) by mouth 2 (two) times daily. 60 tablet 6   clopidogrel  (PLAVIX ) 75 MG tablet TAKE 1 TABLET BY MOUTH DAILY 60 tablet 11   cyclobenzaprine  (FLEXERIL ) 5 MG tablet Take 1 tablet (5 mg total) by mouth 3 (three) times daily as needed. 60 tablet 2   dapagliflozin  propanediol (FARXIGA ) 10 MG TABS tablet Take 1 tablet (10 mg total) by mouth daily. 90 tablet 3   Dulaglutide (TRULICITY) 1.5 MG/0.5ML SOPN Inject 1.5 mg into the skin once a week. Fridays     ENTRESTO  97-103 MG TAKE 1 TABLET BY MOUTH 2 TIMES A DAY 60 tablet 11   ezetimibe  (ZETIA ) 10 MG tablet Take 1 tablet (10 mg total) by mouth daily. 90 tablet 1   magnesium  oxide (MAG-OX) 400 MG tablet Take 1 tablet (400 mg total) by mouth daily. 90 tablet 3   metolazone  (ZAROXOLYN ) 5 MG tablet TAKE 1 TABLET (5 MG TOTAL) BY MOUTH DAILY AS NEEDED (FOR EDEMA). 30 tablet 9   mexiletine (MEXITIL ) 200 MG capsule TAKE 1 CAPSULE BY MOUTH 2 TIMES A DAY 180 capsule 1   potassium chloride  SA (KLOR-CON  M) 20 MEQ tablet Take 1 tablet (20 mEq total) by mouth daily as needed (when taking torsemide ). 30 tablet 6   REPATHA  SURECLICK 140 MG/ML SOAJ INJECT 140MG  INTO THE SKIN EVERY 14 DAYS 2 mL 11   sertraline  (ZOLOFT ) 100 MG tablet TAKE 1 TABLET BY MOUTH DAILY 90 tablet 2   spironolactone  (ALDACTONE ) 25 MG tablet TAKE 1 TABLET BY MOUTH DAILY 90 tablet 1   tiZANidine  (ZANAFLEX ) 4 MG tablet TAKE 1 TABLET BY MOUTH 2 TIMES A DAY AS NEEDED 60 tablet 0   torsemide  (DEMADEX ) 20 MG tablet Take 1 tablet (20 mg total) by mouth daily as needed. 30 tablet 6   VASCEPA  1 g capsule Take 2 capsules (2 g total) by mouth 2 (two) times daily. 120 capsule 6   No current facility-administered medications for this visit.     Assessment & Plan    Hyperlipidemia Assessment: Patient with ASCVD and familial hyperlipidemia not at LDL goal of < 55 Most recent LDL 106 on 10/21/23 Has been compliant with Repatha  140 mg and ezetimibe  10 mg Not able to tolerate statins  secondary to myalgias  - noted in higher doses Reviewed options for lowering LDL cholesterol -  bempedoic acid and low dose statin retrial.  Discussed mechanisms of action, dosing, side effects, potential decreases in LDL cholesterol and costs.  Also reviewed potential options for patient assistance.  Plan: Patient agreeable to starting rosuvastatin  5 mg three times weekly  Repeat labs after:  3 months Lipid Liver function    Allean Mink, PharmD CPP [facility] [address]   [place], [place] [address] [phone]  12/22/2023, 4:40 PM

## 2023-12-22 NOTE — Assessment & Plan Note (Signed)
 Assessment: Patient with ASCVD and familial hyperlipidemia not at LDL goal of < 55 Most recent LDL 106 on 10/21/23 Has been compliant with Repatha  140 mg and ezetimibe  10 mg Not able to tolerate statins secondary to myalgias  - noted in higher doses Reviewed options for lowering LDL cholesterol -  bempedoic acid and low dose statin retrial.  Discussed mechanisms of action, dosing, side effects, potential decreases in LDL cholesterol and costs.  Also reviewed potential options for patient assistance.  Plan: Patient agreeable to starting rosuvastatin  5 mg three times weekly Repeat labs after:  3 months Lipid Liver function

## 2023-12-22 NOTE — Patient Instructions (Signed)
 Your Results:             Your most recent labs Goal  Total Cholesterol 174 < 200  Triglycerides 134 < 150  HDL (happy/good cholesterol) 41 > 40  LDL (lousy/bad cholesterol 106 < 70   Medication changes:  Restart rosuvastatin  at 5 mg three times weekly (Mon, Wed, Fri).  If this causes muscle aches, stop for about 2 weeks then try again just once or twice weekly.  Continue with Repatha  and ezetimibe .    Lab orders:  We want to repeat labs after 2-3 months.  We will send you a lab order to remind you once we get closer to that time.      Thank you for choosing [facility]

## 2023-12-23 ENCOUNTER — Encounter: Payer: Self-pay | Admitting: Pharmacist Clinician (PhC)/ Clinical Pharmacy Specialist

## 2023-12-23 DIAGNOSIS — E78019 Familial hypercholesterolemia, unspecified: Secondary | ICD-10-CM

## 2023-12-29 ENCOUNTER — Ambulatory Visit: Payer: Self-pay | Admitting: Internal Medicine

## 2024-01-05 ENCOUNTER — Other Ambulatory Visit: Payer: Self-pay

## 2024-01-05 ENCOUNTER — Other Ambulatory Visit: Payer: Self-pay | Admitting: Internal Medicine

## 2024-01-05 DIAGNOSIS — E78019 Familial hypercholesterolemia, unspecified: Secondary | ICD-10-CM | POA: Diagnosis not present

## 2024-01-06 ENCOUNTER — Ambulatory Visit: Payer: Self-pay | Admitting: Pharmacist Clinician (PhC)/ Clinical Pharmacy Specialist

## 2024-01-06 DIAGNOSIS — M9903 Segmental and somatic dysfunction of lumbar region: Secondary | ICD-10-CM | POA: Diagnosis not present

## 2024-01-06 DIAGNOSIS — M9902 Segmental and somatic dysfunction of thoracic region: Secondary | ICD-10-CM | POA: Diagnosis not present

## 2024-01-06 DIAGNOSIS — M9908 Segmental and somatic dysfunction of rib cage: Secondary | ICD-10-CM | POA: Diagnosis not present

## 2024-01-06 DIAGNOSIS — M9905 Segmental and somatic dysfunction of pelvic region: Secondary | ICD-10-CM | POA: Diagnosis not present

## 2024-01-06 LAB — LIPID PANEL
Chol/HDL Ratio: 3.4 ratio (ref 0.0–5.0)
Cholesterol, Total: 133 mg/dL (ref 100–199)
HDL: 39 mg/dL — ABNORMAL LOW (ref >39)
LDL Chol Calc (NIH): 68 mg/dL (ref 0–99)
Triglycerides: 153 mg/dL — ABNORMAL HIGH (ref 0–149)
VLDL Cholesterol Cal: 26 mg/dL (ref 5–40)

## 2024-01-06 LAB — HEPATIC FUNCTION PANEL
ALT: 13 IU/L (ref 0–44)
AST: 17 IU/L (ref 0–40)
Albumin: 4.2 g/dL (ref 4.1–5.1)
Alkaline Phosphatase: 88 IU/L (ref 47–123)
Bilirubin Total: 0.3 mg/dL (ref 0.0–1.2)
Bilirubin, Direct: 0.1 mg/dL (ref 0.00–0.40)
Total Protein: 6.8 g/dL (ref 6.0–8.5)

## 2024-01-19 ENCOUNTER — Encounter: Payer: Self-pay | Admitting: Pharmacist Clinician (PhC)/ Clinical Pharmacy Specialist

## 2024-01-19 DIAGNOSIS — E78019 Familial hypercholesterolemia, unspecified: Secondary | ICD-10-CM

## 2024-01-27 DIAGNOSIS — M9908 Segmental and somatic dysfunction of rib cage: Secondary | ICD-10-CM | POA: Diagnosis not present

## 2024-01-27 DIAGNOSIS — M9901 Segmental and somatic dysfunction of cervical region: Secondary | ICD-10-CM | POA: Diagnosis not present

## 2024-01-27 DIAGNOSIS — M9902 Segmental and somatic dysfunction of thoracic region: Secondary | ICD-10-CM | POA: Diagnosis not present

## 2024-01-27 DIAGNOSIS — M9903 Segmental and somatic dysfunction of lumbar region: Secondary | ICD-10-CM | POA: Diagnosis not present

## 2024-01-30 ENCOUNTER — Encounter: Payer: Self-pay | Admitting: Emergency Medicine

## 2024-03-04 NOTE — Addendum Note (Signed)
 Addended by: Conception Doebler L on: 03/04/2024 09:52 AM   Modules accepted: Orders

## 2024-03-11 ENCOUNTER — Ambulatory Visit

## 2024-03-11 DIAGNOSIS — I255 Ischemic cardiomyopathy: Secondary | ICD-10-CM

## 2024-03-11 LAB — CUP PACEART REMOTE DEVICE CHECK
Battery Remaining Longevity: 76 mo
Battery Remaining Percentage: 74 %
Battery Voltage: 3.01 V
Brady Statistic RV Percent Paced: 0 %
Date Time Interrogation Session: 20260122020018
HighPow Impedance: 53 Ohm
HighPow Impedance: 53 Ohm
Implantable Lead Connection Status: 753985
Implantable Lead Implant Date: 20111010
Implantable Lead Location: 753860
Implantable Lead Model: 7121
Implantable Pulse Generator Implant Date: 20230417
Lead Channel Impedance Value: 1250 Ohm
Lead Channel Pacing Threshold Amplitude: 2.5 V
Lead Channel Pacing Threshold Pulse Width: 1.2 ms
Lead Channel Sensing Intrinsic Amplitude: 11.5 mV
Lead Channel Setting Pacing Amplitude: 2.75 V
Lead Channel Setting Pacing Pulse Width: 1.2 ms
Lead Channel Setting Sensing Sensitivity: 0.5 mV
Pulse Gen Serial Number: 8926353
Zone Setting Status: 755011

## 2024-03-13 NOTE — Progress Notes (Signed)
 Remote ICD Transmission

## 2024-03-17 ENCOUNTER — Ambulatory Visit

## 2024-03-17 ENCOUNTER — Ambulatory Visit: Admitting: Internal Medicine

## 2024-03-17 DIAGNOSIS — Z23 Encounter for immunization: Secondary | ICD-10-CM | POA: Diagnosis not present

## 2024-03-17 NOTE — Progress Notes (Signed)
 After obtaining consent, and per orders of Dr. Norleen, injection of Hep B and RFLU given by Ronnald SHAUNNA Palms. Patient instructed to report any adverse reaction to me immediately.

## 2024-03-21 ENCOUNTER — Ambulatory Visit: Payer: Self-pay | Admitting: Student in an Organized Health Care Education/Training Program

## 2024-03-22 ENCOUNTER — Other Ambulatory Visit (HOSPITAL_COMMUNITY): Payer: Self-pay

## 2024-04-19 ENCOUNTER — Encounter (HOSPITAL_COMMUNITY)

## 2024-06-10 ENCOUNTER — Encounter

## 2024-09-09 ENCOUNTER — Encounter

## 2024-12-09 ENCOUNTER — Encounter
